# Patient Record
Sex: Female | Born: 1955 | Race: White | Hispanic: No | Marital: Married | Smoking: Current every day smoker
Health system: Southern US, Community
[De-identification: ages and names within clinical notes are randomized; demographics above are authoritative.]

## PROBLEM LIST (undated history)

## (undated) DIAGNOSIS — E781 Pure hyperglyceridemia: Secondary | ICD-10-CM

## (undated) DIAGNOSIS — J40 Bronchitis, not specified as acute or chronic: Secondary | ICD-10-CM

## (undated) DIAGNOSIS — R000 Tachycardia, unspecified: Secondary | ICD-10-CM

## (undated) DIAGNOSIS — F32A Depression, unspecified: Secondary | ICD-10-CM

## (undated) DIAGNOSIS — G510 Bell's palsy: Secondary | ICD-10-CM

## (undated) DIAGNOSIS — F419 Anxiety disorder, unspecified: Secondary | ICD-10-CM

## (undated) DIAGNOSIS — J3089 Other allergic rhinitis: Secondary | ICD-10-CM

## (undated) DIAGNOSIS — K219 Gastro-esophageal reflux disease without esophagitis: Secondary | ICD-10-CM

## (undated) DIAGNOSIS — R011 Cardiac murmur, unspecified: Secondary | ICD-10-CM

## (undated) DIAGNOSIS — N9489 Other specified conditions associated with female genital organs and menstrual cycle: Secondary | ICD-10-CM

## (undated) DIAGNOSIS — T4145XA Adverse effect of unspecified anesthetic, initial encounter: Secondary | ICD-10-CM

## (undated) DIAGNOSIS — K2270 Barrett's esophagus without dysplasia: Secondary | ICD-10-CM

## (undated) DIAGNOSIS — F329 Major depressive disorder, single episode, unspecified: Secondary | ICD-10-CM

## (undated) DIAGNOSIS — T8859XA Other complications of anesthesia, initial encounter: Secondary | ICD-10-CM

## (undated) DIAGNOSIS — J449 Chronic obstructive pulmonary disease, unspecified: Secondary | ICD-10-CM

## (undated) DIAGNOSIS — M5030 Other cervical disc degeneration, unspecified cervical region: Secondary | ICD-10-CM

## (undated) DIAGNOSIS — G43909 Migraine, unspecified, not intractable, without status migrainosus: Secondary | ICD-10-CM

## (undated) DIAGNOSIS — L570 Actinic keratosis: Secondary | ICD-10-CM

## (undated) HISTORY — DX: Cardiac murmur, unspecified: R01.1

## (undated) HISTORY — DX: Pure hyperglyceridemia: E78.1

## (undated) HISTORY — DX: Other cervical disc degeneration, unspecified cervical region: M50.30

## (undated) HISTORY — PX: APPENDECTOMY: SHX54

## (undated) HISTORY — PX: CHOLECYSTECTOMY: SHX55

## (undated) HISTORY — PX: DIAGNOSTIC LAPAROSCOPY: SUR761

## (undated) HISTORY — DX: Other specified conditions associated with female genital organs and menstrual cycle: N94.89

## (undated) HISTORY — DX: Actinic keratosis: L57.0

## (undated) HISTORY — PX: ABDOMINAL HYSTERECTOMY: SHX81

## (undated) HISTORY — PX: CARDIAC CATHETERIZATION: SHX172

---

## 1978-02-01 HISTORY — PX: GALLBLADDER SURGERY: SHX652

## 1981-02-01 HISTORY — PX: TUBAL LIGATION: SHX77

## 1997-06-10 ENCOUNTER — Other Ambulatory Visit: Admission: RE | Admit: 1997-06-10 | Discharge: 1997-06-10 | Payer: Self-pay | Admitting: *Deleted

## 1997-07-01 ENCOUNTER — Other Ambulatory Visit: Admission: RE | Admit: 1997-07-01 | Discharge: 1997-07-01 | Payer: Self-pay | Admitting: *Deleted

## 1998-09-26 ENCOUNTER — Emergency Department (HOSPITAL_COMMUNITY): Admission: EM | Admit: 1998-09-26 | Discharge: 1998-09-26 | Payer: Self-pay | Admitting: Emergency Medicine

## 2002-12-20 ENCOUNTER — Emergency Department (HOSPITAL_COMMUNITY): Admission: AD | Admit: 2002-12-20 | Discharge: 2002-12-20 | Payer: Self-pay | Admitting: Family Medicine

## 2003-03-14 ENCOUNTER — Emergency Department (HOSPITAL_COMMUNITY): Admission: EM | Admit: 2003-03-14 | Discharge: 2003-03-14 | Payer: Self-pay | Admitting: Family Medicine

## 2003-03-16 ENCOUNTER — Emergency Department (HOSPITAL_COMMUNITY): Admission: AD | Admit: 2003-03-16 | Discharge: 2003-03-16 | Payer: Self-pay | Admitting: Emergency Medicine

## 2003-05-05 ENCOUNTER — Emergency Department (HOSPITAL_COMMUNITY): Admission: AD | Admit: 2003-05-05 | Discharge: 2003-05-05 | Payer: Self-pay | Admitting: Family Medicine

## 2003-06-20 ENCOUNTER — Emergency Department (HOSPITAL_COMMUNITY): Admission: EM | Admit: 2003-06-20 | Discharge: 2003-06-20 | Payer: Self-pay | Admitting: Emergency Medicine

## 2004-01-14 ENCOUNTER — Ambulatory Visit: Payer: Self-pay | Admitting: Gastroenterology

## 2004-01-24 ENCOUNTER — Emergency Department (HOSPITAL_COMMUNITY): Admission: EM | Admit: 2004-01-24 | Discharge: 2004-01-25 | Payer: Self-pay | Admitting: Emergency Medicine

## 2004-02-14 ENCOUNTER — Ambulatory Visit: Payer: Self-pay | Admitting: Gastroenterology

## 2004-07-02 ENCOUNTER — Emergency Department (HOSPITAL_COMMUNITY): Admission: EM | Admit: 2004-07-02 | Discharge: 2004-07-02 | Payer: Self-pay | Admitting: Emergency Medicine

## 2004-10-09 ENCOUNTER — Ambulatory Visit: Payer: Self-pay

## 2004-12-10 ENCOUNTER — Emergency Department: Payer: Self-pay | Admitting: Emergency Medicine

## 2004-12-27 ENCOUNTER — Emergency Department (HOSPITAL_COMMUNITY): Admission: EM | Admit: 2004-12-27 | Discharge: 2004-12-27 | Payer: Self-pay | Admitting: Emergency Medicine

## 2005-01-08 ENCOUNTER — Emergency Department (HOSPITAL_COMMUNITY): Admission: EM | Admit: 2005-01-08 | Discharge: 2005-01-08 | Payer: Self-pay | Admitting: Family Medicine

## 2005-01-10 ENCOUNTER — Emergency Department (HOSPITAL_COMMUNITY): Admission: AD | Admit: 2005-01-10 | Discharge: 2005-01-10 | Payer: Self-pay | Admitting: Family Medicine

## 2005-02-23 ENCOUNTER — Emergency Department (HOSPITAL_COMMUNITY): Admission: EM | Admit: 2005-02-23 | Discharge: 2005-02-24 | Payer: Self-pay | Admitting: Emergency Medicine

## 2005-02-27 ENCOUNTER — Emergency Department (HOSPITAL_COMMUNITY): Admission: EM | Admit: 2005-02-27 | Discharge: 2005-02-27 | Payer: Self-pay | Admitting: Emergency Medicine

## 2005-03-28 ENCOUNTER — Emergency Department (HOSPITAL_COMMUNITY): Admission: EM | Admit: 2005-03-28 | Discharge: 2005-03-28 | Payer: Self-pay | Admitting: Family Medicine

## 2005-04-15 ENCOUNTER — Emergency Department (HOSPITAL_COMMUNITY): Admission: EM | Admit: 2005-04-15 | Discharge: 2005-04-15 | Payer: Self-pay | Admitting: Family Medicine

## 2005-05-14 ENCOUNTER — Emergency Department (HOSPITAL_COMMUNITY): Admission: EM | Admit: 2005-05-14 | Discharge: 2005-05-14 | Payer: Self-pay | Admitting: Family Medicine

## 2005-06-03 ENCOUNTER — Emergency Department (HOSPITAL_COMMUNITY): Admission: EM | Admit: 2005-06-03 | Discharge: 2005-06-04 | Payer: Self-pay | Admitting: Emergency Medicine

## 2005-06-06 ENCOUNTER — Emergency Department (HOSPITAL_COMMUNITY): Admission: EM | Admit: 2005-06-06 | Discharge: 2005-06-06 | Payer: Self-pay | Admitting: *Deleted

## 2005-06-06 ENCOUNTER — Inpatient Hospital Stay (HOSPITAL_COMMUNITY): Admission: EM | Admit: 2005-06-06 | Discharge: 2005-06-12 | Payer: Self-pay | Admitting: *Deleted

## 2005-06-07 ENCOUNTER — Ambulatory Visit: Payer: Self-pay | Admitting: *Deleted

## 2005-07-07 ENCOUNTER — Emergency Department (HOSPITAL_COMMUNITY): Admission: EM | Admit: 2005-07-07 | Discharge: 2005-07-07 | Payer: Self-pay | Admitting: Emergency Medicine

## 2005-10-08 ENCOUNTER — Ambulatory Visit: Payer: Self-pay | Admitting: Psychiatry

## 2005-10-08 ENCOUNTER — Inpatient Hospital Stay (HOSPITAL_COMMUNITY): Admission: RE | Admit: 2005-10-08 | Discharge: 2005-10-16 | Payer: Self-pay | Admitting: Psychiatry

## 2005-10-20 ENCOUNTER — Other Ambulatory Visit (HOSPITAL_COMMUNITY): Admission: RE | Admit: 2005-10-20 | Discharge: 2006-01-18 | Payer: Self-pay | Admitting: Psychiatry

## 2006-09-12 ENCOUNTER — Emergency Department: Payer: Self-pay | Admitting: Emergency Medicine

## 2007-10-04 ENCOUNTER — Emergency Department: Payer: Self-pay | Admitting: Emergency Medicine

## 2007-11-01 ENCOUNTER — Ambulatory Visit: Payer: Self-pay | Admitting: Gastroenterology

## 2007-11-06 ENCOUNTER — Ambulatory Visit: Payer: Self-pay | Admitting: Gastroenterology

## 2007-11-20 ENCOUNTER — Ambulatory Visit: Payer: Self-pay | Admitting: Gastroenterology

## 2008-05-17 DIAGNOSIS — K589 Irritable bowel syndrome without diarrhea: Secondary | ICD-10-CM | POA: Insufficient documentation

## 2008-05-17 DIAGNOSIS — K2270 Barrett's esophagus without dysplasia: Secondary | ICD-10-CM | POA: Insufficient documentation

## 2008-05-17 DIAGNOSIS — F319 Bipolar disorder, unspecified: Secondary | ICD-10-CM | POA: Insufficient documentation

## 2008-08-22 DIAGNOSIS — E782 Mixed hyperlipidemia: Secondary | ICD-10-CM | POA: Insufficient documentation

## 2008-10-29 DIAGNOSIS — R634 Abnormal weight loss: Secondary | ICD-10-CM | POA: Insufficient documentation

## 2008-10-29 DIAGNOSIS — R5381 Other malaise: Secondary | ICD-10-CM | POA: Insufficient documentation

## 2008-10-29 DIAGNOSIS — R5383 Other fatigue: Secondary | ICD-10-CM | POA: Insufficient documentation

## 2009-06-11 ENCOUNTER — Emergency Department: Payer: Self-pay

## 2009-06-16 DIAGNOSIS — E861 Hypovolemia: Secondary | ICD-10-CM

## 2009-06-16 DIAGNOSIS — E869 Volume depletion, unspecified: Secondary | ICD-10-CM | POA: Insufficient documentation

## 2009-06-18 DIAGNOSIS — E7800 Pure hypercholesterolemia, unspecified: Secondary | ICD-10-CM | POA: Insufficient documentation

## 2009-07-30 ENCOUNTER — Ambulatory Visit: Payer: Self-pay | Admitting: Family

## 2009-08-05 ENCOUNTER — Ambulatory Visit: Payer: Self-pay | Admitting: Gastroenterology

## 2009-08-06 ENCOUNTER — Ambulatory Visit: Payer: Self-pay | Admitting: Gastroenterology

## 2009-11-20 ENCOUNTER — Ambulatory Visit: Payer: Self-pay | Admitting: Family Medicine

## 2009-11-20 DIAGNOSIS — F419 Anxiety disorder, unspecified: Secondary | ICD-10-CM | POA: Insufficient documentation

## 2009-11-20 DIAGNOSIS — R600 Localized edema: Secondary | ICD-10-CM | POA: Insufficient documentation

## 2009-11-20 DIAGNOSIS — F329 Major depressive disorder, single episode, unspecified: Secondary | ICD-10-CM

## 2009-11-21 ENCOUNTER — Encounter: Payer: Self-pay | Admitting: Family Medicine

## 2009-11-21 LAB — CONVERTED CEMR LAB
AST: 17 units/L (ref 0–37)
Alkaline Phosphatase: 84 units/L (ref 39–117)
Basophils Absolute: 0 10*3/uL (ref 0.0–0.1)
Basophils Relative: 1 % (ref 0–1)
CO2: 24 meq/L (ref 19–32)
Chloride: 103 meq/L (ref 96–112)
Creatinine, Ser: 1.04 mg/dL (ref 0.40–1.20)
HCT: 41.3 % (ref 36.0–46.0)
Hemoglobin: 13.7 g/dL (ref 12.0–15.0)
Lymphs Abs: 2.7 10*3/uL (ref 0.7–4.0)
MCHC: 33.2 g/dL (ref 30.0–36.0)
RBC: 4.3 M/uL (ref 3.87–5.11)
Total Bilirubin: 0.4 mg/dL (ref 0.3–1.2)

## 2009-11-22 ENCOUNTER — Telehealth (INDEPENDENT_AMBULATORY_CARE_PROVIDER_SITE_OTHER): Payer: Self-pay | Admitting: Internal Medicine

## 2010-01-20 ENCOUNTER — Ambulatory Visit: Payer: Self-pay | Admitting: Unknown Physician Specialty

## 2010-03-03 NOTE — Letter (Signed)
Summary: Generic Letter  [facility]  [address]   [place], [place] [address]   Phone: [phone]  Fax: [phone]    11/21/2009  Jordan Ortega [address] [place], [place]  [address]  EQUIPMENT ORDERS  CRUTCHES  DIAGNOSES:   (ICD-825.20)  (ICD-274.01)  (ICD-729.5)  (ICD-715.98)       Sincerely,   Standley Dakins MD

## 2010-03-03 NOTE — Letter (Signed)
Summary: Generic Letter  [facility]  [address]   [place], [place] [address]   Phone: [phone]  Fax: [phone]    11/20/2009  Jordan Ortega [address] [place], [place]  [address]  Dear [name],  Please Xray Left foot Diagnoses Left foot pain and swelling  - Acute  Please Fax Results to [phone]        Sincerely,   Standley Dakins MD

## 2010-03-03 NOTE — Letter (Signed)
Summary: internal charges  internal charges   Imported By: Dorna Leitz 11/20/2009 15:56:41  _____________________________________________________________________  External Attachment:    Type:   Image     Comment:   External Document

## 2010-03-03 NOTE — Letter (Signed)
Summary: Out of Work  [facility]  [address]   [place], [place] [address]   Phone: [phone]  Fax: [phone]    November 20, 2009   Employee:  Jordan Ortega    To Whom It May Concern:   For Medical reasons, please excuse the above named employee from work for the following dates:  Start:   11/20/09  End:   11/23/09  If you need additional information, please feel free to contact our office.         Sincerely,    Standley Dakins MD

## 2010-03-03 NOTE — Progress Notes (Signed)
Summary: phone note  Phone Note Outgoing Call   Call placed by: Providence Crosby LPN,  November 22, 2009 2:48 PM Call placed to: Patient Summary of Call: have left message for patient to return call x 2 so I can let her know about her labs Initial call taken by: Providence Crosby LPN,  November 22, 2009 2:49 PM  Follow-up for Phone Call        Patient notified of lab work; labs normal. Patient now states "what do I do both feet hurt now." Told patient I would let the Doctor know. Follow-up by: Providence Crosby LPN,  November 23, 2009 10:59 AM    Additional Follow-up for Phone Call Additional follow up Details #2::    labs and xrays OK. On ibup 800 three times a day  Will switch to mobic 15 mg once daily if patient wishes. Add tylenol 1 g four times daily. elevate and ice. consult podiatry per Dr. Henriette Combs f/u advice. jam md  Additional Follow-up for Phone Call Additional follow up Details #3:: Details for Additional Follow-up Action Taken: Patient notified of Dr. Garen Lah instructions: states she does not want to take mobic at present;  Wants to try ibuprofen and tylenol at present; again stressed to patient if no better in two days to call podiatry as recommened by Dr. Laural Benes. Additional Follow-up by: Providence Crosby LPN,  November 23, 2009 12:16 PM

## 2010-03-03 NOTE — Assessment & Plan Note (Signed)
Summary: LEFT FOOT PAIN/EVM   Vital Signs:  Patient Profile:   55 Years Old Female CC:      Left Foot Pain. Height:     61.25 inches Weight:      118 pounds BMI:     22.19 O2 Sat:      99 % O2 treatment:    Room Air Temp:     99 degrees F oral Pulse rate:   88 / minute Pulse rhythm:   regular Resp:     20 per minute BP sitting:   127 / 86  (right arm)  Pt. in pain?   yes    Location:   foot    Intensity:   8    Type:       sharp  Vitals Entered By: Levonne Spiller EMT-P (November 20, 2009 12:07 PM)              Is Patient Diabetic? No Comments Pt. is a smoker. 1half pack per day.      Current Allergies: ! VALIUM (DIAZEPAM) ! MORPHINE ! * CODINEHistory of Present Illness History from: patient Reason for visit: see chief complaint Chief Complaint: Left Foot Pain. History of Present Illness: Pt says that she got up this morning and started walking and she noticed a severe sharp pain in the left foot.  Pt says that it hurt really bad 8/10 and she says that she knows that it was not associated with any traumatic event. She says that she has no history of gout.  she says that the foot has been swollen in red in the areas where she was feeling pain.  She says that she was having difficulty bearing weight on the left foot.  Also, she says that she has not ever experienced this before. She does have a bunion in the left foot that has been present for quite some time. No history of OA or RA or Lupus.   No SOB or CP.     REVIEW OF SYSTEMS Constitutional Symptoms      Denies fever, chills, night sweats, weight loss, weight gain, and fatigue.  Eyes       Denies change in vision, eye pain, eye discharge, glasses, contact lenses, and eye surgery. Ear/Nose/Throat/Mouth       Denies hearing loss/aids, change in hearing, ear pain, ear discharge, dizziness, frequent runny nose, frequent nose bleeds, sinus problems, sore throat, hoarseness, and tooth pain or bleeding.  Respiratory  Denies dry cough, productive cough, wheezing, shortness of breath, asthma, bronchitis, and emphysema/COPD.  Cardiovascular       Denies murmurs, chest pain, and tires easily with exhertion.    Gastrointestinal       Denies stomach pain, nausea/vomiting, diarrhea, constipation, blood in bowel movements, and indigestion. Genitourniary       Denies painful urination, kidney stones, and loss of urinary control. Neurological       Denies paralysis, seizures, and fainting/blackouts. Musculoskeletal       Complains of muscle pain and joint pain.      Denies joint stiffness, decreased range of motion, redness, swelling, muscle weakness, and gout.  Skin       Denies bruising, unusual mles/lumps or sores, and hair/skin or nail changes.      Comments: Minor Swelling Psych       Denies mood changes, temper/anger issues, anxiety/stress, speech problems, depression, and sleep problems.  Past History:  Past Medical History: Anxiety Depression  Past Surgical History: Cholecystectomy Hysterectomy  Family History:  Parents living and siblings living and well per patient  Social History: Pt denies Alcohol use-no Drug use-no Pt working full time.  Drug Use:  no Physical Exam General appearance: well developed, well nourished, no acute distress Head: normocephalic, atraumatic Eyes: conjunctivae and lids normal Pupils: equal, round, reactive to light Ears: normal, no lesions or deformities Nasal: mucosa pink, nonedematous, no septal deviation, turbinates normal Oral/Pharynx: tongue normal, posterior pharynx without erythema or exudate Neck: neck supple,  trachea midline, no masses Chest/Lungs: no rales, wheezes, or rhonchi bilateral, breath sounds equal without effort Heart: regular rate and  rhythm, no murmur Abdomen: soft, non-tender without obvious organomegaly Extremities: painful left foot between 1st and 2nd toes, FROM of toes, ankle, normal pedal pulses, swelling between toes 1 and 2.  mild metatarsal phalangeal tenderness with palpation Neurological: grossly intact and non-focal Skin: no obvious rashes or lesions MSE: oriented to time, place, and person Assessment New Problems: DEPRESSION (ICD-311) ANXIETY (ICD-300.00) ? of FRACTURE, FOOT (ICD-825.20) ? of ACUTE GOUTY ARTHROPATHY (ICD-274.01) FOOT PAIN (ICD-729.5) ? of OSTEOARTHROS UNSPEC GEN/LOC OTH SPEC SITES [phone])   Patient Education: Patient and/or caregiver instructed in the following: rest, Ibuprofen prn. The risks, benefits and possible side effects were clearly explained and discussed with the patient.  The patient verbalized clear understanding.  The patient was given instructions to return if symptoms don't improve, worsen or new changes develop.  If it is not during clinic hours and the patient cannot get back to this clinic then the patient was told to seek medical care at an available urgent care or emergency department.  The patient verbalized understanding.   Demonstrates willingness to comply.  Plan New Medications/Changes: IBUPROFEN 800 MG TABS (IBUPROFEN) take 1 by mouth every 8 hours with food as needed foot pain  #15 x 0, 11/20/2009, Clanford Johnson MD  New Orders: CBC w/Diff [[id number]] T-CMP with Estimated GFR [[address]] Sed Rate (ESR)-[facility] [85651] T-Uric Acid (Blood) [84550-23180] Antinuclear Antib (ANA) [[id number]] Follow Up: Follow up in 1-2 days if no improvement, Follow up on an as needed basis, Follow up with Primary Physician  The patient and/or caregiver has been counseled thoroughly with regard to medications prescribed including dosage, schedule, interactions, rationale for use, and possible side effects and they verbalize understanding.  Diagnoses and expected course of recovery discussed and will return if not improved as expected or if the condition worsens. Patient and/or caregiver verbalized understanding.  Prescriptions: IBUPROFEN 800 MG TABS (IBUPROFEN) take 1 by  mouth every 8 hours with food as needed foot pain  #15 x 0   Entered and Authorized by:   Standley Dakins MD   Signed by:   Standley Dakins MD on 11/20/2009   Method used:   Electronically to        [facility]* (retail)       [address]       [place], [place]  [address]       Ph: [phone]       Fax: [phone]   RxID:   [id number]   Patient Instructions: 1)  Go to get your XRays of the left foot done ASAP 2)  Try to stay off the left foot for the next 1-2 days 3)  Take 400-600mg  of Ibuprofen (Advil, Motrin) with food every 8 hours as needed for relief of pain or comfort of fever. 4)  See a podiatrist to have your left foot evaluated ASAP 5)  The patient was informed that there is no on-call provider or services  available at this clinic during off-hours (when the clinic is closed).  If the patient developed a problem or concern that required immediate attention, the patient was advised to go the the nearest available urgent care or emergency department for medical care.  The patient verbalized understanding.    6)  It was clearly explained to the patient that this [facility] is not intended to be a primary care clinic.  The patient is always better served by the continuity of care and the provider/patient relationships developed with their dedicated primary care provider.  The patient was told to be sure to follow up as soon as possible with their primary care provider to discuss treatments received and to receive further examination and testing.  The patient verbalized understanding. The will f/u with PCP ASAP.   Medication Administration  Injection # 1:    Medication: Toradol 60mg     Diagnosis: FOOT PAIN (ICD-729.5)    Route: IM    Site: LUOQ gluteus    Exp Date: 01/02/2011    Lot #: [id number]    Mfr: Hospira    Patient tolerated injection without complications    Given by: Levonne Spiller EMT-P (November 20, 2009 1:15 PM)  Orders Added: 1)  CBC w/Diff  [[id number]] 2)  T-CMP with Estimated GFR [[address]] 3)  Sed Rate (ESR)-[facility] [85651] 4)  T-Uric Acid (Blood) [84550-23180] 5)  Antinuclear Antib (ANA) [[id number]]
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
## 2010-06-19 NOTE — Discharge Summary (Signed)
NAME:  Jordan Ortega, Jordan Ortega NO.:  [id number]   MEDICAL RECORD NO.:  [id number]          PATIENT TYPE:  IPS   LOCATION:  0302                          FACILITY:  [facility]   PHYSICIAN:  Jasmine Pang, M.D. DATE OF BIRTH:  Jun 08, 1955   DATE OF ADMISSION:  06/06/2005  DATE OF DISCHARGE:  06/12/2005                                 DISCHARGE SUMMARY   IDENTIFYING INFORMATION:  The patient is a 55 year old married Caucasian  female who was admitted on a voluntary basis on Jun 06, 2005.   HISTORY OF PRESENT ILLNESS:  The patient had a history of increased Valium  use.  She took 8 10 mg Valium on the Friday prior to admission hoping to  make herself feel better and to make painful feelings stop.  She was having  problems at work with her Production designer, theatre/television/film.  She states he is very belittling to her  and other coworkers in front of people.  She feels embarrassed by this.  She  states she also drinks when she takes Valium.  She used to drink more,  especially up to a 12-pack.  She sleeps okay with medication.  She has had a  recent increase in weight.  She was weighing 70 pounds and now weighs 90  pounds.  Apparently, this increase in weight was intentional.   On admission, the patient's mental status exam revealed an alert,  cooperative, thin Caucasian female with good eye contact.  She was well-  groomed.  She was casually dressed.  Her speech was slow and deliberate.  Her psychomotor activity was decreased.  Mood:  She states she felt  depressed and overwhelmed.  Affect was flat.  She denied suicidal or  homicidal ideation and states she has lost track of the amount of Valium she  took.  She denies she was trying to harm herself.  She just wanted to feel  better.  There was no self-injurious behavior.  There was no auditory or  visual hallucinations.  No paranoia or delusions.  Thought process were  coherent and linear.  Thought content worried about her migraine headache  pain.  Cognitive  exam was grossly within normal limits.   PAST PSYCHIATRIC HISTORY:  This is the first Behavioral Health admission for  Jordan Ortega.  She is in treatment with Dr. Milagros Evener.  She is unclear  how long this has been.  She has had a history of overdose x2 during the  last year by her report.   ALCOHOL/DRUG HISTORY:  She denies any substance abuse history.  Smokes  cigarettes, denies any drug use.  Does state she drinks alcohol.   PAST MEDICAL HISTORY:  The patient is seen at the [facility]  for her outpatient medical care.  Medical problems are migraines.   MEDICATIONS:  Phenergan 25 mg q.d., Premarin 1.25 mg q.d., Seroquel 50 mg  q.h.s., amitriptyline 150 mg q.d.   DRUG ALLERGIES:  She is allergic to CLARITIN, CODEINE, TOPROL and NUBAIN.   PHYSICAL EXAMINATION:  The patient's physical exam was done by Landry Corporal, our nurse practitioner.  She was found to have no acute medical  problems.  Temperature was 97.8, pulse 81, respirations 18, BP 104/71.  She  was 100 pounds and 5 feet 1 inch,   LABORATORY DATA:  On admission, initial TSH was high at 7.72.  A repeat was  ordered with a T4 and T3.  The TSH, at that point, as well as the T4 and T3  were within normal limits.  Routine chemistry panel was grossly within  normal limits.  Alcohol level was less than 5.  CBC was within normal  limits.  UDS positive for benzodiazepines.   HOSPITAL COURSE:  Upon admission, the patient was continued on Phenergan 25  mg p.o. IM q.6h. p.r.n. nausea, Premarin 1.25 mg p.o. q.h.s., Seroquel 50 mg  p.o. q.h.s., amitriptyline 150 mg p.o. q.h.s., Percocet 5/500 mg, 2 tablets  p.o. x1 now, trazodone 50 mg p.o. q.h.s. p.r.n. may repeat x1.  The patient  was also started on a low-dose Librium protocol.  On Jun 07, 2005, Percocet  was discontinued and Vicodin 5/500 mg, 2 tablets p.o. x1 was given.  Also on  Jun 07, 2005, Dilaudid 1 mg IM x1 now was given due to severe migraine  headaches.   On Jun 08, 2005, Fioricet 2 mg p.o. now, then 1 q.6h. p.r.n.  migraine headaches was ordered.  On Jun 09, 2005, Seroquel was increased to  100 mg p.o. q.h.s.  On Jun 10, 2005, Cymbalta was started at 30 mg p.o.  q.h.s. x1 day, then 60 mg p.o. q.h.s.  On Jun 11, 2005, Seroquel 50 mg p.o.  t.i.d. p.r.n. anxiety was started.  On Jun 11, 2005, her Fioricet dose was  increased to 2 pills p.o. q.6h. p.r.n. headaches.  The patient tolerated her  meds well with no significant side effects.  She stated she was not getting  relief from her pain medications.   The patient was quite depressed and tearful initially.  She discussed with  me at length her stress at work.  She states her direct supervisor is very  critical and likes to embarrass me in front of others.  She states he  would deliberately criticize all the employees in public where others can  hear.  She reported having a supportive husband and sons.  She has been  married for 25 years..  She again denied that she had deliberately taken an  overdose of Valium.  She continued to take more due to her anxiety and then  passed out.  At that point, she was taken to the ED.  On Jun 09, 2005, the  patient was very tearful and depressed.  She was upset extensively because  she was not allowed to keep her prayer shawl with her (it has be locked up  for safety reasons).  I explained the reason but patient remained very  focused on the situation.  On Jun 10, 2005, the patient was depressed and  anxious with a sad, worried affect.  She complained of migraine headaches.  She wanted an injection of Demerol.  It was at that point, I decided to add  Cymbalta.  We also began some Fioricet for her pain.  On Jun 11, 2005, there  was some improvement in her mood.  She was less depressed and anxious.  She  wanted to go home the following day.  There was a family session scheduled.  Fioricet was being given for her migraine headaches.  On Jun 12, 2005, a family  session was  held with our Child psychotherapist and  patient's husband.  Events leading up to admission were discussed.  The  patient was able to identify triggers of being left alone in the house and  increased stress at work.  She spoke about using painting, working in the  garden and attending AA as ways to deal with stress and loneliness.  Her  husband agreed to attend AA support meetings with her and she requested  getting info on meetings in [place].  She spoke about a plan to stay busy  and active upon returning home.  She identified the desire to learn how to  say no to others and learn how to take care of herself.  It was recommended  by our social worker that she have individual therapy to help her continue  to work on decreasing her stressors and overall support.   On Jun 12, 2005, the patient felt ready for discharge.  Her mental status  had improved from admission status.  Her family session had gone well.  She  was friendly and cooperative with good eye contact.  Psychomotor was within  normal limits.  Speech normal rate and flow.  Mood less depressed and less  anxious.  Affect wider range.  There was no suicidal or homicidal ideation.  No self-injurious behavior.  No auditory or visual hallucinations.  No  paranoia or delusions.  Thoughts logical and goal-directed.  Thought content  still focused on her migraine headaches and wanting medicine for this.  Cognitive exam was grossly within normal limits.  The patient will go home  with her husband and follow-up will be with Dr. Milagros Evener.   DISCHARGE DIAGNOSES:  AXIS I:  Major depression, recurrent, severe without  psychosis.  Polysubstance abuse.  AXIS II:  None.  AXIS III:  Migraines.  AXIS IV:  Severe (occupational problems).  AXIS V:  GAF upon admission 35; highest past year 65; GAF upon discharge 45.   ACTIVITY/DIET:  There were no specific activity level or dietary  restrictions.   DISCHARGE MEDICATIONS:  1.   Premarin 1.25 mg tablet at bedtime.  2.  Elavil 50 mg, 3 pills at night.  3.  Seroquel 100 mg p.o. q.h.s.  4.  Cymbalta 60 mg p.o. q.h.s.   POST-HOSPITAL CARE PLANS:  The patient will be followed by Dr. Milagros Evener  at [facility].  She will be seen on Tuesday, July 06, 2005  at 1:30 p.m.      Jasmine Pang, M.D.  Electronically Signed     [facility]/MEDQ  D:  06/12/2005  T:  06/14/2005  Job:  [id number]

## 2010-06-19 NOTE — Discharge Summary (Signed)
NAMEMEKENZIE, MODESTE NO.:  [id number]   MEDICAL RECORD NO.:  [id number]          PATIENT TYPE:  IPS   LOCATION:  0503                          FACILITY:  [facility]   PHYSICIAN:  Anselm Jungling, MD  DATE OF BIRTH:  1955-11-20   DATE OF ADMISSION:  10/08/2005  DATE OF DISCHARGE:  10/16/2005                                 DISCHARGE SUMMARY   IDENTIFYING DATA AND REASON FOR ADMISSION:  The patient is a 55 year old  married white female who reported to our intensive outpatient program on  September 7, and there disclosed a plan to cut herself, in association with  suicidal ideation.  The doctor who evaluated her there recommended that she  be admitted.  The patient reported a history of depression for the past 10-  15 years, and had also been treated for migraine headaches for at least that  long.  She had recently been restarted on Valium in August 2007, reportedly  taking 10 mg three times daily.  A family friend had recently died in [place]  in the conflict there.  This had been 3 weeks prior to admission and it felt  like one of her children had died.  She had previously been hospitalized in  our inpatient service in early May 2007.  Please refer to the admission note  for further details pertaining to the symptoms, circumstances and history  that led to her hospitalization.  She was given initial Axis I diagnosis of  major depressive disorder recurrent, benzodiazepine withdrawal, and an Axis  II diagnosis of rule out personality disorder.   MEDICAL AND LABORATORY:  The patient was medically and physically assessed  by the psychiatric nurse practitioner.  She came to [place] on ASA 81 mg daily  and Premarin 0.625 mg daily.  She had a long history of migraine headaches,  and apparently had had numerous emergency room visits for various injection  based opiate  treatments for same.   The patient complained of migraine headache during her inpatient stay and  was treated with  IM Dilaudid initially, but it appeared that this was not  really appropriate for the patient in light of her history of rebound  headaches in response to Dilaudid.  Following this, she was changed to a  regimen of p.r.n., Fioricet.  Phenergan was also used in conjunction with  these analgesics.   There are no acute medical issues during this brief inpatient psychiatric  stay.  Naproxen 500 mg q.12 h was also utilized for pain relief.   HOSPITAL COURSE:  The patient was admitted to the adult inpatient  psychiatric service.  She presented as a petite, but well-nourished, and  normally developed adult female.  Initially she was unable to participate  very much in the treatment program, complaining of severe migraine headache.  When she received Dilaudid injection, she was then unable to participate  much.  She insisted that the only thing that would relieve her headaches  with be to be knocked out for 4+ hours at a time.  It was communicated to  the patient after the initial  Dilaudid injection that this would not be an  acceptable strategy for her inpatient course, that she needed to be able to  participate in all therapeutic groups and activities.   Unbeknown to [place], the patient had been taking benzodiazepines in much larger  quantities then she had initially admitted to upon admission.  Consequently,  the patient appeared to go into benzodiazepine withdrawal and experienced 2-  3 days of moderate confusion and mild delirium, often going into patient's  rooms, and having difficulty keeping track of time and structure.   After this past, the patient was able to  comment on the fact that she had  been quite confused for several days and had little memory of that time.  The patient was confronted about the assessment of chemical dependency and  need to refrain from opiate analgesics use, and benzodiazepine use.  The  patient indicated that she might consider the possibility of some form of   chemical dependency treatment following her inpatient stabilization, but did  not appear to take the expressions of concern and associated warnings  seriously about chemical dependency.   The patient requested discharge on the ninth hospital day.  At this point,  her mentation and sensorium was entirely clear.  She was relaxed, pleasant,  appropriate, and absent suicidal ideation.  She appeared appropriate for  discharge.  Chemical dependency treatment was recommended, and the patient  left open the door the possibility of attending our chemical dependency  intensive outpatient treatment the following Monday.   AFTERCARE:  The patient was to follow-up as referenced above.  Other follow-  up appointments were to be arranged at the time of discharge, which occurred  on a Saturday.  The psychiatric case manager was to make definitive  appointments  for her following the weekend, which would be communicated to  the patient.   DISCHARGE MEDICATIONS:  Lexapro 10 mg daily, aspirin 81 mg daily, lithium  carbonate 300 mg b.i.d., naproxen 500 mg b.i.d., Seroquel 25 mg t.i.d. and  100 mg q.h.s..   DISCHARGE DIAGNOSES:  AXIS I: Mood disorder not otherwise specified,  benzodiazepine dependence, early remission.  AXIS II: Cluster B personality traits.  AXIS III: History of chronic migraine headache.  AXIS IV: Stressors severe.  AXIS V: Global assessment of functioning on discharge 65.      Anselm Jungling, MD  Electronically Signed     SPB/MEDQ  D:  10/16/2005  T:  10/17/2005  Job:  [id number]

## 2010-09-28 ENCOUNTER — Telehealth: Payer: Self-pay

## 2011-01-04 ENCOUNTER — Ambulatory Visit: Payer: Self-pay | Admitting: Family Medicine

## 2011-08-19 ENCOUNTER — Emergency Department: Payer: Self-pay | Admitting: Emergency Medicine

## 2011-08-19 LAB — CBC
MCV: 93 fL (ref 80–100)
Platelet: 260 10*3/uL (ref 150–440)
RBC: 3.94 10*6/uL (ref 3.80–5.20)
RDW: 12.6 % (ref 11.5–14.5)
WBC: 10.4 10*3/uL (ref 3.6–11.0)

## 2011-08-19 LAB — COMPREHENSIVE METABOLIC PANEL
Albumin: 3.4 g/dL (ref 3.4–5.0)
Alkaline Phosphatase: 116 U/L (ref 50–136)
Anion Gap: 7 (ref 7–16)
BUN: 13 mg/dL (ref 7–18)
Bilirubin,Total: 0.2 mg/dL (ref 0.2–1.0)
Creatinine: 1.24 mg/dL (ref 0.60–1.30)
Osmolality: 282 (ref 275–301)
Potassium: 3.5 mmol/L (ref 3.5–5.1)
Sodium: 141 mmol/L (ref 136–145)
Total Protein: 6.9 g/dL (ref 6.4–8.2)

## 2011-08-19 LAB — TROPONIN I: Troponin-I: <0.02 ng/mL

## 2011-11-09 NOTE — Telephone Encounter (Signed)
tkk 

## 2011-11-28 ENCOUNTER — Inpatient Hospital Stay (HOSPITAL_COMMUNITY)
Admission: EM | Admit: 2011-11-28 | Discharge: 2011-11-30 | DRG: 247 | Disposition: A | Discharge status: Home / Self Care | Payer: BC Managed Care – PPO | Attending: Internal Medicine | Admitting: Internal Medicine

## 2011-11-28 ENCOUNTER — Encounter (HOSPITAL_COMMUNITY): Payer: Self-pay | Admitting: *Deleted

## 2011-11-28 ENCOUNTER — Emergency Department (HOSPITAL_COMMUNITY): Payer: BC Managed Care – PPO

## 2011-11-28 DIAGNOSIS — F17200 Nicotine dependence, unspecified, uncomplicated: Secondary | ICD-10-CM | POA: Diagnosis present

## 2011-11-28 DIAGNOSIS — F329 Major depressive disorder, single episode, unspecified: Secondary | ICD-10-CM

## 2011-11-28 DIAGNOSIS — F32A Depression, unspecified: Secondary | ICD-10-CM | POA: Diagnosis present

## 2011-11-28 DIAGNOSIS — G43909 Migraine, unspecified, not intractable, without status migrainosus: Secondary | ICD-10-CM | POA: Diagnosis present

## 2011-11-28 DIAGNOSIS — Z79899 Other long term (current) drug therapy: Secondary | ICD-10-CM

## 2011-11-28 DIAGNOSIS — R29810 Facial weakness: Secondary | ICD-10-CM | POA: Diagnosis present

## 2011-11-28 DIAGNOSIS — R531 Weakness: Secondary | ICD-10-CM | POA: Diagnosis present

## 2011-11-28 DIAGNOSIS — F419 Anxiety disorder, unspecified: Secondary | ICD-10-CM | POA: Diagnosis present

## 2011-11-28 DIAGNOSIS — F3289 Other specified depressive episodes: Secondary | ICD-10-CM | POA: Diagnosis present

## 2011-11-28 DIAGNOSIS — F411 Generalized anxiety disorder: Secondary | ICD-10-CM | POA: Diagnosis present

## 2011-11-28 DIAGNOSIS — R29898 Other symptoms and signs involving the musculoskeletal system: Principal | ICD-10-CM | POA: Diagnosis present

## 2011-11-28 DIAGNOSIS — M6281 Muscle weakness (generalized): Secondary | ICD-10-CM

## 2011-11-28 HISTORY — DX: Bell's palsy: G51.0

## 2011-11-28 HISTORY — DX: Migraine, unspecified, not intractable, without status migrainosus: G43.909

## 2011-11-28 LAB — COMPREHENSIVE METABOLIC PANEL
AST: 18 U/L (ref 0–37)
Albumin: 4.2 g/dL (ref 3.5–5.2)
Alkaline Phosphatase: 69 U/L (ref 39–117)
BUN: 15 mg/dL (ref 6–23)
CO2: 26 mEq/L (ref 19–32)
Chloride: 103 mEq/L (ref 96–112)
Potassium: 3.3 mEq/L — ABNORMAL LOW (ref 3.5–5.1)
Total Bilirubin: 0.2 mg/dL — ABNORMAL LOW (ref 0.3–1.2)

## 2011-11-28 LAB — CBC
HCT: 36.6 % (ref 36.0–46.0)
MCV: 90.4 fL (ref 78.0–100.0)
RDW: 12.9 % (ref 11.5–15.5)
WBC: 6.2 10*3/uL (ref 4.0–10.5)

## 2011-11-28 LAB — DIFFERENTIAL
Eosinophils Absolute: 0.1 10*3/uL (ref 0.0–0.7)
Neutro Abs: 3 10*3/uL (ref 1.7–7.7)
Neutrophils Relative %: 49 % (ref 43–77)

## 2011-11-28 LAB — PROTIME-INR
INR: 1.03 (ref 0.00–1.49)
Prothrombin Time: 13.4 seconds (ref 11.6–15.2)

## 2011-11-28 LAB — GLUCOSE, CAPILLARY

## 2011-11-28 MED ORDER — DEXAMETHASONE SODIUM PHOSPHATE 10 MG/ML IJ SOLN
10.0000 mg | Freq: Once | INTRAMUSCULAR | Status: AC
  Administered 2011-11-28: 10 mg via INTRAVENOUS
  Filled 2011-11-28: qty 1

## 2011-11-28 NOTE — Code Documentation (Signed)
56 yo wf brought in via [facility] for acute onset facial numbness & Lt side weakness.  Pt reports having a migraine HA x3wks which she has been treating with vicodin & BC powder. 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  She sat down on the floor & her coworkers called EMS.  On arrival to [facility] pt had a varying neuro exam and was scored NIH 6 for facial droop, LUE & LLE weakness, sensory loss on Lt.  Code stroke called 2205, Pt arrival , EDP exam 2221, stroke team arrival 2210, LSN 2115, pt arrival in [place] 2223, phlebotomist arrival 2233. Code stroke was not cancelled.

## 2011-11-28 NOTE — Consult Note (Addendum)
Reason for Consult:stroke Referring Physician: Dr Jordan Ortega (ER)  Jordan Ortega is an 56 y.o. female.   HPI: 56 yo wf w h/o migraines x 25 yrs who is brought in via [facility] for acute onset left facial numbness & Lt side weakness.  Pt reports having a migraine HA x3wks (usual frequency about "once every few months") which she has been treating with vicodin & BC powder. 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 She sat down on the floor & her coworkers called EMS. On arrival, pt had a variable neuro exam and was scored NIH 6 for facial droop, LUE & LLE weakness, sensory loss on Lt.   She notes currently a "8/10" headache, with photo/phonophobia.  Denies any diplopia, dysphagia,dysarthria, or right sided complaints.   Past Medical History  Diagnosis Date  . Migraines     Past Surgical History  Procedure Date  . Abdominal hysterectomy   . Cholecystectomy     History reviewed. No pertinent family history.  Social History:  reports that she has been smoking Cigarettes.  She has been smoking about .5 packs per day. She does not have any smokeless tobacco history on file. She reports that she drinks alcohol. She reports that she does not use illicit drugs.  Allergies:  Allergies  Allergen Reactions  . Codeine Hives and Itching    REACTION: Dizziness  . Diazepam Other (See Comments)    REACTION: Behavioral problems  . Morphine Hives  . Niacin And Related Other (See Comments)    flushing    Medications: No current facility-administered medications for this encounter. No current outpatient prescriptions on file.   Results for orders placed during the hospital encounter of 11/28/11 (from the past 48 hour(s))  GLUCOSE, CAPILLARY     Status: Normal   Collection Time   11/28/11 10:43 PM      Component Value Range Comment   Glucose-Capillary 92  70 - 99 mg/dL    Comment 1 Notify RN      Comment 2 Documented in Chart     CBC     Status: Normal   Collection Time   11/28/11 10:54 PM      Component Value Range Comment   WBC 6.2  4.0 - 10.5 K/uL    RBC 4.05  3.87 - 5.11 MIL/uL    Hemoglobin 12.4  12.0 - 15.0 g/dL    HCT 47.8  29.5 - 62.1 %    MCV 90.4  78.0 - 100.0 fL    MCH 30.6  26.0 - 34.0 pg    MCHC 33.9  30.0 - 36.0 g/dL    RDW 30.8  65.7 - 84.6 %    Platelets 231  150 - 400 K/uL   DIFFERENTIAL     Status: Normal   Collection Time   11/28/11 10:54 PM      Component Value Range Comment   Neutrophils Relative 49  43 - 77 %    Neutro Abs 3.0  1.7 - 7.7 K/uL    Lymphocytes Relative 42  12 - 46 %    Lymphs Abs 2.6  0.7 - 4.0 K/uL    Monocytes Relative 7  3 - 12 %    Monocytes Absolute 0.5  0.1 - 1.0 K/uL    Eosinophils Relative 2  0 - 5 %    Eosinophils Absolute 0.1  0.0 -  0.7 K/uL    Basophils Relative 1  0 - 1 %    Basophils Absolute 0.0  0.0 - 0.1 K/uL     Ct Head Wo Contrast  11/28/2011  *RADIOLOGY REPORT*  Clinical Data: Code stroke.  Right-sided facial droop and weakness.  CT HEAD WITHOUT CONTRAST  Technique:  Contiguous axial images were obtained from the base of the skull through the vertex without contrast.  Comparison: CT head without contrast 07/07/2005.  Findings: No acute intracranial abnormality is present. Specifically, there is no evidence for acute infarct, hemorrhage, mass, hydrocephalus, or extra-axial fluid collection.  The paranasal sinuses and mastoid air cells are clear.  The globes and orbits are intact.  The osseous skull is intact.  IMPRESSION: Negative CT of the head.  These results were called by telephone on 11/28/2011 at 10:35 p.m. to Jordan Pugh, RN; Rapid Response Team, who verbally acknowledged these results.   Original Report Authenticated By: Jordan Ortega. Jordan Ortega, M.D.     Review of Systems: A comprehensive review of systems was negative except for: Neurological: positive for headaches  Blood pressure  120/75, pulse 76, resp. rate 16, SpO2 100.00%.  Neurological Exam:  Patient is alert and oriented x 3.  No acute distress.  Mildly dysarthric speech.  Able to name, repeat, and follow commands.  EOMI, VFFC, PERRL.  Face with ? Right lower facial weakness.  She reports increased pinprick sensitivity on the left, and that vibratory sensation "hurts". Tongue protrudes midline, otherwise CN's II-XII intact. Motor: Normal bulk and tone with 5/5 strength on right.  Appears to have ? 4/5 on the left, but give-way weakness noted.  Sensation:  Hypersensitive to pinprick/vibration on the left.  Cerebellar:  Normal finger to nose  Variable drift on left.  DTR's 2+ throughout with toes downgoing  CV: RRR  Lungs: CTAB  Abd: soft, NT/ND  Skin:  No cuts/ abrasions  Assessment/Plan:  1) Diziness/ left side weakness (variable) 2) probable migraine (likely associated with above)  Discussion:  56 YO WF with h/o migraines who presents with persistent migraine for past 3 weeks, and acute onset of diziness and left sided parasthesias/weakness tonight.  Her exam is quite variable and Head CT negative.   Overall, her headache seems to be a significant part of her presentation and may be associated with her more acute symptoms.  The variable nature of her exam suggests some functional overlay.  She does appear to have minimal vascular risk factors, but a stroke certainly cannot be excluded.  Recc:  1) vascular workup (Head MRI/ MRA, carotid, Echo) 2) PT, OT, ST 3) Telemetry 4) Aspirin/ Statin, and permissive BP control 5) try some Decadron for her acute headache 6) will need to clarify her migraine preventive regimen...needs to avoid OTC meds/ BC powders...likely has med overuse component   Beryle Beams Neurology Pager # [phone] 11/28/2011, 11:08 PM    ** 11/29/11 (0015 hrs);  Called by Dr Jordan Ortega (ER) out of concern she was "worse".  He notes she is exhibiting significant left side weakness and "failed"  her swallow eval.   I was able to re-examine pt and she continues to complain of significant left sided "pain" with manipulation of the left side, and still has some give-way weakness on the left.   She feels the left side is "better".  I see no real change on my exam.  Plan:  After discussion with Dr Jordan Ortega, will get diffusion weighted MRI....  If positive for ischemic lesion, then will need  further intervention.  Have also d/w husband and son at bedside.  Dr Olin Pia

## 2011-11-28 NOTE — ED Notes (Signed)
Pt presents w/ migraine HA x3 weeks - today approx 2115 pt began having rt facial drooping and left-sided weakness including LUE and LLE. Pt A&Ox4 on arrival. Pt also c/o heaviness to left arm.

## 2011-11-29 ENCOUNTER — Emergency Department (HOSPITAL_COMMUNITY): Payer: BC Managed Care – PPO

## 2011-11-29 ENCOUNTER — Encounter (HOSPITAL_COMMUNITY): Payer: Self-pay | Admitting: *Deleted

## 2011-11-29 DIAGNOSIS — R6889 Other general symptoms and signs: Secondary | ICD-10-CM

## 2011-11-29 DIAGNOSIS — F329 Major depressive disorder, single episode, unspecified: Secondary | ICD-10-CM

## 2011-11-29 DIAGNOSIS — F411 Generalized anxiety disorder: Secondary | ICD-10-CM

## 2011-11-29 LAB — GLUCOSE, CAPILLARY

## 2011-11-29 MED ORDER — KETOROLAC TROMETHAMINE 15 MG/ML IJ SOLN
15.0000 mg | Freq: Once | INTRAMUSCULAR | Status: AC
  Administered 2011-11-29: 15 mg via INTRAVENOUS
  Filled 2011-11-29: qty 1

## 2011-11-29 MED ORDER — POTASSIUM CHLORIDE IN NACL 20-0.9 MEQ/L-% IV SOLN
INTRAVENOUS | Status: DC
  Filled 2011-11-29: qty 1000

## 2011-11-29 MED ORDER — VALPROATE SODIUM 500 MG/5ML IV SOLN
500.0000 mg | Freq: Once | INTRAVENOUS | Status: AC
  Administered 2011-11-29: 500 mg via INTRAVENOUS
  Filled 2011-11-29: qty 5

## 2011-11-29 MED ORDER — SODIUM CHLORIDE 0.9 % IJ SOLN: 3.0000 mL | Freq: Two times a day (BID) | INTRAMUSCULAR | Status: DC

## 2011-11-29 MED ORDER — PROPRANOLOL HCL 20 MG PO TABS
20.0000 mg | ORAL_TABLET | Freq: Every day | ORAL | Status: DC
  Administered 2011-11-29 – 2011-11-30 (×2): 20 mg via ORAL
  Filled 2011-11-29 (×2): qty 1

## 2011-11-29 MED ORDER — KETOROLAC TROMETHAMINE 30 MG/ML IJ SOLN
30.0000 mg | Freq: Once | INTRAMUSCULAR | Status: AC
  Administered 2011-11-29: 30 mg via INTRAVENOUS
  Filled 2011-11-29: qty 1

## 2011-11-29 MED ORDER — SODIUM CHLORIDE 0.9 % IV SOLN
INTRAVENOUS | Status: DC
  Administered 2011-11-29: 04:00:00 via INTRAVENOUS

## 2011-11-29 MED ORDER — AMITRIPTYLINE HCL 100 MG PO TABS
100.0000 mg | ORAL_TABLET | Freq: Every day | ORAL | Status: DC
  Administered 2011-11-29: 100 mg via ORAL
  Filled 2011-11-29 (×2): qty 1

## 2011-11-29 MED ORDER — DEXAMETHASONE SODIUM PHOSPHATE 10 MG/ML IJ SOLN: 10.0000 mg | Freq: Once | INTRAMUSCULAR | Status: DC

## 2011-11-29 MED ORDER — VALPROATE SODIUM 500 MG/5ML IV SOLN
500.0000 mg | Freq: Once | INTRAVENOUS | Status: DC
  Filled 2011-11-29: qty 5

## 2011-11-29 MED ORDER — INFLUENZA VIRUS VACC SPLIT PF IM SUSP
0.5000 mL | INTRAMUSCULAR | Status: AC
  Administered 2011-11-30: 0.5 mL via INTRAMUSCULAR
  Filled 2011-11-29: qty 0.5

## 2011-11-29 MED ORDER — KETOROLAC TROMETHAMINE 30 MG/ML IJ SOLN
INTRAMUSCULAR | Status: AC
  Filled 2011-11-29: qty 1

## 2011-11-29 MED ORDER — KETOROLAC TROMETHAMINE 30 MG/ML IJ SOLN: 30.0000 mg | Freq: Once | INTRAMUSCULAR | Status: DC

## 2011-11-29 MED ORDER — METOCLOPRAMIDE HCL 5 MG/ML IJ SOLN
10.0000 mg | Freq: Once | INTRAMUSCULAR | Status: AC
  Administered 2011-11-29: 10 mg via INTRAVENOUS
  Filled 2011-11-29: qty 2

## 2011-11-29 MED ORDER — METOCLOPRAMIDE HCL 5 MG/ML IJ SOLN
10.0000 mg | Freq: Once | INTRAMUSCULAR | Status: AC
  Administered 2011-11-29: 10 mg via INTRAVENOUS
  Filled 2011-11-29 (×2): qty 2

## 2011-11-29 MED ORDER — NALBUPHINE HCL 10 MG/ML IJ SOLN
10.0000 mg | INTRAMUSCULAR | Status: DC | PRN
  Filled 2011-11-29 (×3): qty 1

## 2011-11-29 MED ORDER — PROMETHAZINE HCL 25 MG PO TABS
12.5000 mg | ORAL_TABLET | Freq: Four times a day (QID) | ORAL | Status: DC | PRN
  Administered 2011-11-29: 12.5 mg via ORAL
  Filled 2011-11-29: qty 1

## 2011-11-29 MED ORDER — ASPIRIN EC 81 MG PO TBEC
81.0000 mg | DELAYED_RELEASE_TABLET | Freq: Every day | ORAL | Status: DC
  Administered 2011-11-29 – 2011-11-30 (×2): 81 mg via ORAL
  Filled 2011-11-29 (×2): qty 1

## 2011-11-29 MED ORDER — DIPHENHYDRAMINE HCL 50 MG/ML IJ SOLN
25.0000 mg | Freq: Once | INTRAMUSCULAR | Status: AC
  Administered 2011-11-29: 25 mg via INTRAVENOUS
  Filled 2011-11-29: qty 1

## 2011-11-29 MED ORDER — SODIUM CHLORIDE 0.9 % IV SOLN
INTRAVENOUS | Status: DC
  Administered 2011-11-29: 19:00:00 via INTRAVENOUS

## 2011-11-29 MED ORDER — KETOROLAC TROMETHAMINE 30 MG/ML IJ SOLN
30.0000 mg | Freq: Once | INTRAMUSCULAR | Status: AC
  Administered 2011-11-29: 30 mg via INTRAVENOUS

## 2011-11-29 MED ORDER — DEXAMETHASONE SODIUM PHOSPHATE 10 MG/ML IJ SOLN
10.0000 mg | Freq: Once | INTRAMUSCULAR | Status: AC
  Administered 2011-11-29: 10 mg via INTRAVENOUS
  Filled 2011-11-29: qty 1

## 2011-11-29 MED ORDER — INSULIN ASPART 100 UNIT/ML ~~LOC~~ SOLN
0.0000 [IU] | Freq: Three times a day (TID) | SUBCUTANEOUS | Status: DC
  Administered 2011-11-29: 2 [IU] via SUBCUTANEOUS
  Administered 2011-11-30 (×2): 1 [IU] via SUBCUTANEOUS

## 2011-11-29 MED ORDER — PROMETHAZINE HCL 25 MG PO TABS
12.5000 mg | ORAL_TABLET | ORAL | Status: DC | PRN
  Administered 2011-11-29 – 2011-11-30 (×4): 12.5 mg via ORAL
  Filled 2011-11-29 (×4): qty 1

## 2011-11-29 NOTE — H&P (Signed)
Chief Complaint:  Left sided weekness  HPI: 56 yo female with h/o migraine headaches who has had a headache for the last 3 weeks persistently with sudden onset at 915p of left facial weakness and left arm/leg weakness and numbness.  No fevers or rashes at home.  Photophobia present.  Pt states that she use to have 2-3 migraines weekly and was started on amitryptiline and inderal over a year ago and this is only her 3 migraine this year.  However this migraine is much different than her previous migraines, she has never had any neurological deficits before.  She has also associated n/v which is normal.  Pt has neg cth and a stat mri has been ordered.  She received decadron about an hour ago which has not helped.  She normally recieves nubain and phenergan which helps.  No h/o htn or hld or dm.  She does have h/o bell's palsy in past which she had a full recovery from.  Review of Systems:  O/w neg  Past Medical History: Past Medical History  Diagnosis Date  . Migraines    Past Surgical History  Procedure Date  . Abdominal hysterectomy   . Cholecystectomy     Medications: Prior to Admission medications   Medication Sig Start Date End Date Taking? Authorizing Provider  amitriptyline (ELAVIL) 100 MG tablet Take 100 mg by mouth at bedtime.   Yes Historical Provider, MD  CALCIUM CARBONATE PO Take 1 tablet by mouth daily.   Yes Historical Provider, MD  Cholecalciferol (VITAMIN D PO) Take 1 tablet by mouth daily.   Yes Historical Provider, MD  HYDROcodone-acetaminophen (NORCO/VICODIN) 5-325 MG per tablet Take 1 tablet by mouth every 6 (six) hours as needed. For pain   Yes Historical Provider, MD  Multiple Vitamin (MULTIVITAMIN WITH MINERALS) TABS Take 1 tablet by mouth daily.   Yes Historical Provider, MD  omega-3 acid ethyl esters (LOVAZA) 1 G capsule Take 2 g by mouth daily.   Yes Historical Provider, MD  propranolol (INDERAL) 20 MG tablet Take 20 mg by mouth daily.   Yes Historical Provider,  MD  Pyridoxine HCl (VITAMIN B6 PO) Take 1 tablet by mouth daily.   Yes Historical Provider, MD  simvastatin (ZOCOR) 20 MG tablet Take 20 mg by mouth at bedtime.   Yes Historical Provider, MD    Allergies:   Allergies  Allergen Reactions  . Codeine Hives and Itching    REACTION: Dizziness  . Diazepam Other (See Comments)    REACTION: Behavioral problems  . Morphine Hives  . Niacin And Related Other (See Comments)    flushing    Social History:  reports that she has been smoking Cigarettes.  She has been smoking about .5 packs per day. She does not have any smokeless tobacco history on file. She reports that she drinks alcohol. She reports that she does not use illicit drugs.  Family History: History reviewed. No pertinent family history.  Physical Exam: Filed Vitals:   11/28/11 2238 11/28/11 2346  BP: 120/75   Pulse: 76   Temp:  98.6 F (37 C)  Resp: 16   SpO2: 100%    General appearance: alert, cooperative and no distress Neck: no JVD and supple, symmetrical, trachea midline Lungs: clear to auscultation bilaterally Heart: regular rate and rhythm, S1, S2 normal, no murmur, click, rub or gallop Abdomen: soft, non-tender; bowel sounds normal; no masses,  no organomegaly Extremities: extremities normal, atraumatic, no cyanosis or edema Pulses: 2+ and symmetric Skin: Skin color,  texture, turgor normal. No rashes or lesions Neurologic: Mental status: Alert, oriented, thought content appropriate cn all grossly intact but says the left side of her face "hurts" when she moves it, may be actually right lower face droop?? lle 4/5 strength lue 4/5 strength normal on right. Reflexes normal.  Labs on Admission:   [facility] 11/28/11 2254  NA 138  K 3.3*  CL 103  CO2 26  GLUCOSE 101*  BUN 15  CREATININE 1.15*  CALCIUM 9.2  MG --  PHOS --    Basename 11/28/11 2254  AST 18  ALT 14  ALKPHOS 69  BILITOT 0.2*  PROT 7.0  ALBUMIN 4.2    Basename 11/28/11 2254  WBC 6.2    NEUTROABS 3.0  HGB 12.4  HCT 36.6  MCV 90.4  PLT 231    Basename 11/28/11 2254  CKTOTAL --  CKMB --  CKMBINDEX --  TROPONINI <0.30   Radiological Exams on Admission: Ct Head Wo Contrast  11/28/2011  *RADIOLOGY REPORT*  Clinical Data: Code stroke.  Right-sided facial droop and weakness.  CT HEAD WITHOUT CONTRAST  Technique:  Contiguous axial images were obtained from the base of the skull through the vertex without contrast.  Comparison: CT head without contrast 07/07/2005.  Findings: No acute intracranial abnormality is present. Specifically, there is no evidence for acute infarct, hemorrhage, mass, hydrocephalus, or extra-axial fluid collection.  The paranasal sinuses and mastoid air cells are clear.  The globes and orbits are intact.  The osseous skull is intact.  IMPRESSION: Negative CT of the head.  These results were called by telephone on 11/28/2011 at 10:35 p.m. to April Pugh, RN; Rapid Response Team, who verbally acknowledged these results.   Original Report Authenticated By: Jamesetta Orleans. MATTERN, M.D.     Assessment/Plan Present on Admission:  56 yo female with left sided hemiparesis with migraine headache cannot r/o cva.  cva vs conversion d/o vs complex migraine .Weakness of left side of body .ANXIETY .DEPRESSION .Migraines  Pt getting stat mri right now.  If positive do full cva w/u.  Asa for now.  Usually nubain and phenergan break her migraine.  Will order that.  Has gotten one dose of decadron.  Neuro following.   Raylon Lamson A [phone] 11/29/2011, 12:27 AM

## 2011-11-29 NOTE — ED Notes (Signed)
Patient transported to MRI 

## 2011-11-29 NOTE — ED Notes (Signed)
Pt returned from MRI °

## 2011-11-29 NOTE — Progress Notes (Signed)
Pt still complaining of 8/10 headache after morning dose of inderal and depacon and Toradol IV.  MD notified about another form of pain management since Nubain is on back order per pharmacy.  No new orders received at this time.  Gave pt a cold pack to use on forehead.  Will continue to monitor.  Gardy Montanari, [place] Marie, RN

## 2011-11-29 NOTE — ED Notes (Addendum)
Pt c/o nausea - Dr. Ignacia Palma made aware, orders given for IV benadryl and reglan.

## 2011-11-29 NOTE — Progress Notes (Addendum)
[facility] PROGRESS NOTE  Jordan Ortega ZOX:[id number] DOB: 30-Nov-1955 DOA: 11/28/2011 PCP: Sheila Oats, MD  Assessment/Plan: 1. Headache/L sided weakness: MRI negative, ? Hemiplegic migraine, functional component, neurology following, will await Neuro input today to determine if complete stroke workup with MRA/ECHO/Carotid duplex needed here, received decadron x1 and toradol  last pm, will continue Amitriptyline, Propranolol and also continue ASA/Statin 2. ? Failed swallow eval with crackers last pm, but did ok with liquids: speech to reassess this am  Code Status: Full Family Communication: husband and son at bedside Disposition Plan: home   Consultants:  Neurology   HPI/Subjective: headache better 4-5/10, L side still weak but a little better  Objective: Filed Vitals:   11/29/11 0220 11/29/11 0415 11/29/11 0618 11/29/11 0915  BP: 116/69 106/61 96/59 98/64   Pulse: 71 58 66 64  Temp: 97.9 F (36.6 C) 97.8 F (36.6 C) 97.7 F (36.5 C)   TempSrc: Oral Oral Oral   Resp: 18 18 18    Height: 5' 0.5" (1.537 m)     Weight: 52.1 kg (114 lb 13.8 oz)     SpO2: 99% 99% 99%     Intake/Output Summary (Last 24 hours) at 11/29/11 0940 Last data filed at 11/29/11 0230  Gross per 24 hour  Intake      0 ml  Output    200 ml  Net   -200 ml   Filed Weights   11/29/11 0220  Weight: 52.1 kg (114 lb 13.8 oz)    Exam: General appearance: alert, cooperative and no distress  Neck: no JVD and supple, symmetrical, trachea midline  Lungs: clear to auscultation bilaterally  Heart: regular rate and rhythm, S1, S2 normal, no murmur, click, rub or gallop  Abdomen: soft, non-tender; bowel sounds normal; no masses, no organomegaly  Extremities: extremities normal, atraumatic, no cyanosis or edema  Pulses: 2+ and symmetric  Skin: Skin color, texture, turgor normal. No rashes or lesions  Neurologic: Mental status: Alert, oriented, thought content appropriate ,  Cranial nerves  grossly intact, motor 3-4/5 LLE  And 4/5 LUE, DTR 1 plus, reflexes diminishes b/l  Data Reviewed: Basic Metabolic Panel:  Lab 11/28/11 8119  NA 138  K 3.3*  CL 103  CO2 26  GLUCOSE 101*  BUN 15  CREATININE 1.15*  CALCIUM 9.2  MG --  PHOS --   Liver Function Tests:  Lab 11/28/11 2254  AST 18  ALT 14  ALKPHOS 69  BILITOT 0.2*  PROT 7.0  ALBUMIN 4.2   No results found for this basename: LIPASE:5,AMYLASE:5 in the last 168 hours No results found for this basename: AMMONIA:5 in the last 168 hours CBC:  Lab 11/28/11 2254  WBC 6.2  NEUTROABS 3.0  HGB 12.4  HCT 36.6  MCV 90.4  PLT 231   Cardiac Enzymes:  Lab 11/28/11 2254  CKTOTAL --  CKMB --  CKMBINDEX --  TROPONINI <0.30   BNP (last 3 results) No results found for this basename: PROBNP:3 in the last 8760 hours CBG:  Lab 11/28/11 2243  GLUCAP 92    No results found for this or any previous visit (from the past 240 hour(s)).   Studies: Ct Head Wo Contrast  11/28/2011  *RADIOLOGY REPORT*  Clinical Data: Code stroke.  Right-sided facial droop and weakness.  CT HEAD WITHOUT CONTRAST  Technique:  Contiguous axial images were obtained from the base of the skull through the vertex without contrast.  Comparison: CT head without contrast 07/07/2005.  Findings: No acute intracranial abnormality is  present. Specifically, there is no evidence for acute infarct, hemorrhage, mass, hydrocephalus, or extra-axial fluid collection.  The paranasal sinuses and mastoid air cells are clear.  The globes and orbits are intact.  The osseous skull is intact.  IMPRESSION: Negative CT of the head.  These results were called by telephone on 11/28/2011 at 10:35 p.m. to April Pugh, RN; Rapid Response Team, who verbally acknowledged these results.   Original Report Authenticated By: Jamesetta Orleans. MATTERN, M.D.    Mr Brain Wo Contrast  11/29/2011  *RADIOLOGY REPORT*  Clinical Data: Code stroke.  New onset left-sided weakness.  MRI HEAD  WITHOUT CONTRAST  Technique:  Multiplanar, multiecho pulse sequences of the brain and surrounding structures were obtained according to standard protocol without intravenous contrast.  Comparison: CT head without contrast 11/28/2011.  Findings: The diffusion weighted images demonstrate no evidence for acute or subacute infarction.  Minimal white matter changes likely within normal limits for age.  No acute hemorrhage, mass lesion, or hemorrhage is present.  The ventricles are of normal size.  No significant extra-axial fluid collection is present.  Flow is present in the major intracranial arteries.  The globes and orbits are intact.  The paranasal sinuses and mastoid air cells are clear.  IMPRESSION: Negative MRI of the brain for age.  These results were called by telephone on 11/29/2011 at 01:45 a.m. to Dr. Olin Pia, who verbally acknowledged these results.   Original Report Authenticated By: Jamesetta Orleans. MATTERN, M.D.     Scheduled Meds:   . amitriptyline  100 mg Oral QHS  . aspirin EC  81 mg Oral Daily  . dexamethasone  10 mg Intravenous Once  . diphenhydrAMINE  25 mg Intravenous Once  . influenza  inactive virus vaccine  0.5 mL Intramuscular Tomorrow-1000  . ketorolac  30 mg Intravenous Once  . metoCLOPramide (REGLAN) injection  10 mg Intravenous Once  . propranolol  20 mg Oral Daily  . sodium chloride  3 mL Intravenous Q12H   Continuous Infusions:   . sodium chloride 160 mL/hr at 11/29/11 0353  . 0.9 % NaCl with KCl 20 mEq / L      Principal Problem:  *Weakness of left side of body Active Problems:  ANXIETY  DEPRESSION  Migraines    Time spent:    [facility]  [facility] Pager [phone] If 8PM-8AM, please contact night-coverage at [email or web address], password [facility] 11/29/2011, 9:40 AM  LOS: 1 day

## 2011-11-29 NOTE — ED Provider Notes (Signed)
History     CSN: [id number]  Arrival date & time 11/28/11  2218   First MD Initiated Contact with Patient 11/28/11 2251      Chief Complaint  Patient presents with  . Code Stroke    (Consider location/radiation/quality/duration/timing/severity/associated sxs/prior treatment) HPI Comments: Pt is a 56 yo woman who had onset of weakness in the left side of her face and the left arm and leg about 9:30 P.M.  She was brought to [facility] ED via EMS as a Code Stroke.  I saw her at the triage desk and had found her to have left facial and left arm and left leg weakness.   Patient is a 56 y.o. female presenting with Acute Neurological Problem. The history is provided by the patient. No language interpreter was used.  Cerebrovascular Accident    Past Medical History  Diagnosis Date  . Migraines     Past Surgical History  Procedure Date  . Abdominal hysterectomy   . Cholecystectomy     History reviewed. No pertinent family history.  History  Substance Use Topics  . Smoking status: Current Every Day Smoker -- 0.5 packs/day    Types: Cigarettes  . Smokeless tobacco: Not on file  . Alcohol Use: Yes     occasional    OB History    Grav Para Term Preterm Abortions TAB SAB Ect Mult Living                  Review of Systems  HENT: Negative.   Eyes: Negative.   Respiratory: Negative.   Cardiovascular: Negative.   Gastrointestinal: Negative.   Genitourinary: Negative.   Musculoskeletal: Negative.   Skin: Negative.   Neurological: Positive for facial asymmetry and weakness.  Psychiatric/Behavioral: Negative.     Allergies  Codeine; Diazepam; Morphine; and Niacin and related  Home Medications   Current Outpatient Rx  Name Route Sig Dispense Refill  . AMITRIPTYLINE HCL 100 MG PO TABS Oral Take 100 mg by mouth at bedtime.    Marland Kitchen CALCIUM CARBONATE PO Oral Take 1 tablet by mouth daily.    Marland Kitchen VITAMIN D PO Oral Take 1 tablet by mouth daily.    Marland Kitchen HYDROCODONE-ACETAMINOPHEN  5-325 MG PO TABS Oral Take 1 tablet by mouth every 6 (six) hours as needed. For pain    . ADULT MULTIVITAMIN W/MINERALS CH Oral Take 1 tablet by mouth daily.    . OMEGA-3-ACID ETHYL ESTERS 1 G PO CAPS Oral Take 2 g by mouth daily.    Marland Kitchen PROPRANOLOL HCL 20 MG PO TABS Oral Take 20 mg by mouth daily.    Marland Kitchen VITAMIN B6 PO Oral Take 1 tablet by mouth daily.    Marland Kitchen SIMVASTATIN 20 MG PO TABS Oral Take 20 mg by mouth at bedtime.      BP 120/75  Pulse 76  Temp 98.6 F (37 C)  Resp 16  SpO2 100%  Physical Exam  Nursing note and vitals reviewed. Constitutional: She is oriented to person, place, and time. She appears well-developed and well-nourished.       In moderate distress, complaining of weakness in the left face and left side of her body.  HENT:  Head: Normocephalic and atraumatic.  Right Ear: External ear normal.  Left Ear: External ear normal.  Mouth/Throat: Oropharynx is clear and moist.       Left facial weakness.  Eyes: Conjunctivae normal and EOM are normal. Pupils are equal, round, and reactive to light.  Neck: Normal range of  motion. Neck supple.  Cardiovascular: Normal rate, regular rhythm and normal heart sounds.   Pulmonary/Chest: Effort normal and breath sounds normal.  Abdominal: Soft. Bowel sounds are normal.  Musculoskeletal:       Right arm and leg move normally.  Neurological: She is alert and oriented to person, place, and time.       Left sided weakness in both arm and leg.  Skin: Skin is warm and dry.  Psychiatric: She has a normal mood and affect. Her behavior is normal.    ED Course  Procedures (including critical care time)  Labs Reviewed  COMPREHENSIVE METABOLIC PANEL - Abnormal; Notable for the following:    Potassium 3.3 (*)     Glucose, Bld 101 (*)     Creatinine, Ser 1.15 (*)     Total Bilirubin 0.2 (*)     GFR calc non Af Amer 53 (*)     GFR calc Af Amer 61 (*)     All other components within normal limits  GLUCOSE, CAPILLARY  PROTIME-INR  APTT    CBC  DIFFERENTIAL  TROPONIN I  CBC WITH DIFFERENTIAL  COMPREHENSIVE METABOLIC PANEL  PROTIME-INR  APTT   Ct Head Wo Contrast  11/28/2011  *RADIOLOGY REPORT*  Clinical Data: Code stroke.  Right-sided facial droop and weakness.  CT HEAD WITHOUT CONTRAST  Technique:  Contiguous axial images were obtained from the base of the skull through the vertex without contrast.  Comparison: CT head without contrast 07/07/2005.  Findings: No acute intracranial abnormality is present. Specifically, there is no evidence for acute infarct, hemorrhage, mass, hydrocephalus, or extra-axial fluid collection.  The paranasal sinuses and mastoid air cells are clear.  The globes and orbits are intact.  The osseous skull is intact.  IMPRESSION: Negative CT of the head.  These results were called by telephone on 11/28/2011 at 10:35 p.m. to April Pugh, RN; Rapid Response Team, who verbally acknowledged these results.   Original Report Authenticated By: Jamesetta Orleans. MATTERN, M.D.    Case discussed with Neurology --> MRI of brain ordered to determine if pt had ischemic stroke or if this is an atypical migraine.  Also discussed with Dr. Alfredo Batty, radiologist.  Call to Dr. Onalee Hua, [facility], who accepts pt for admission to a telemetry bed.  1. Migraine   2. Weakness of left side of body          Carleene Cooper III, MD 11/29/11 0110

## 2011-11-29 NOTE — Progress Notes (Signed)
Subjective: Patient reports that her weakness and numbness has improved but she continues to have her headache which she rates at a  9/10.  She is tearful and concerned that the stress and hours that she works may have contributed to her having her current problems.    Objective: Current vital signs: BP 98/64  Pulse 64  Temp 97.7 F (36.5 C) (Oral)  Resp 18  Ht 5' 0.5" (1.537 m)  Wt 52.1 kg (114 lb 13.8 oz)  BMI 22.06 kg/m2  SpO2 99% Vital signs in last 24 hours: Temp:  [97.7 F (36.5 C)-98.6 F (37 C)] 97.7 F (36.5 C) (10/28 0618) Pulse Rate:  [58-76] 64  (10/28 0915) Resp:  [13-21] 18  (10/28 0618) BP: (96-126)/(59-84) 98/64 mmHg (10/28 0915) SpO2:  [96 %-100 %] 99 % (10/28 0618) Weight:  [52.1 kg (114 lb 13.8 oz)] 52.1 kg (114 lb 13.8 oz) (10/28 0220)  Intake/Output from previous day: 10/27 0701 - 10/28 0700 In: -  Out: 200 [Urine:200] Intake/Output this shift:   Nutritional status: Clear Liquid  Neurologic Exam: Mental Status: Alert, oriented, thought content appropriate.  Speech fluent without evidence of aphasia.  Able to follow 3 step commands without difficulty. Cranial Nerves: II: Discs flat bilaterally; Visual fields grossly normal, pupils equal, round, reactive to light and accommodation III,IV, VI: ptosis not present, extra-ocular motions intact bilaterally V,VII: pulls mouth to right with smile but with speaking uses face symmetrically VIII: hearing normal bilaterally IX,X: gag reflex present XI: bilateral shoulder shrug XII: midline tongue extension Motor: Right : Upper extremity   5/5    Left:     Upper extremity   5/5, no drift  Lower extremity   5/5     Lower extremity   5-/5 Tone and bulk:normal tone throughout; no atrophy noted Deep Tendon Reflexes: 2+ and symmetric throughout Plantars: Right: downgoing   Left: downgoing Cerebellar: normal finger-to-nose, normal rapid alternating movements and normal heel-to-shin test CV: pulses palpable  throughout   Lab Results: Basic Metabolic Panel:  Lab 11/28/11 8841  NA 138  K 3.3*  CL 103  CO2 26  GLUCOSE 101*  BUN 15  CREATININE 1.15*  CALCIUM 9.2  MG --  PHOS --    Liver Function Tests:  Lab 11/28/11 2254  AST 18  ALT 14  ALKPHOS 69  BILITOT 0.2*  PROT 7.0  ALBUMIN 4.2   No results found for this basename: LIPASE:5,AMYLASE:5 in the last 168 hours No results found for this basename: AMMONIA:3 in the last 168 hours  CBC:  Lab 11/28/11 2254  WBC 6.2  NEUTROABS 3.0  HGB 12.4  HCT 36.6  MCV 90.4  PLT 231    Cardiac Enzymes:  Lab 11/28/11 2254  CKTOTAL --  CKMB --  CKMBINDEX --  TROPONINI <0.30    Lipid Panel: No results found for this basename: CHOL:5,TRIG:5,HDL:5,CHOLHDL:5,VLDL:5,LDLCALC:5 in the last 168 hours  CBG:  Lab 11/28/11 2243  GLUCAP 92    Microbiology: No results found for this or any previous visit.  Coagulation Studies:  Basename 11/28/11 2254  LABPROT 13.4  INR 1.03    Imaging: Ct Head Wo Contrast  11/28/2011  *RADIOLOGY REPORT*  Clinical Data: Code stroke.  Right-sided facial droop and weakness.  CT HEAD WITHOUT CONTRAST  Technique:  Contiguous axial images were obtained from the base of the skull through the vertex without contrast.  Comparison: CT head without contrast 07/07/2005.  Findings: No acute intracranial abnormality is present. Specifically, there is no evidence  for acute infarct, hemorrhage, mass, hydrocephalus, or extra-axial fluid collection.  The paranasal sinuses and mastoid air cells are clear.  The globes and orbits are intact.  The osseous skull is intact.  IMPRESSION: Negative CT of the head.  These results were called by telephone on 11/28/2011 at 10:35 p.m. to April Pugh, RN; Rapid Response Team, who verbally acknowledged these results.   Original Report Authenticated By: Jamesetta Orleans. MATTERN, M.D.    Mr Brain Wo Contrast  11/29/2011  *RADIOLOGY REPORT*  Clinical Data: Code stroke.  New onset  left-sided weakness.  MRI HEAD WITHOUT CONTRAST  Technique:  Multiplanar, multiecho pulse sequences of the brain and surrounding structures were obtained according to standard protocol without intravenous contrast.  Comparison: CT head without contrast 11/28/2011.  Findings: The diffusion weighted images demonstrate no evidence for acute or subacute infarction.  Minimal white matter changes likely within normal limits for age.  No acute hemorrhage, mass lesion, or hemorrhage is present.  The ventricles are of normal size.  No significant extra-axial fluid collection is present.  Flow is present in the major intracranial arteries.  The globes and orbits are intact.  The paranasal sinuses and mastoid air cells are clear.  IMPRESSION: Negative MRI of the brain for age.  These results were called by telephone on 11/29/2011 at 01:45 a.m. to Dr. Olin Pia, who verbally acknowledged these results.   Original Report Authenticated By: Jamesetta Orleans. MATTERN, M.D.     Medications:  I have reviewed the patient's current medications. Scheduled:   . amitriptyline  100 mg Oral QHS  . aspirin EC  81 mg Oral Daily  . dexamethasone  10 mg Intravenous Once  . diphenhydrAMINE  25 mg Intravenous Once  . influenza  inactive virus vaccine  0.5 mL Intramuscular Tomorrow-1000  . insulin aspart  0-9 Units Subcutaneous TID WC  . ketorolac  30 mg Intravenous Once  . metoCLOPramide (REGLAN) injection  10 mg Intravenous Once  . propranolol  20 mg Oral Daily  . sodium chloride  3 mL Intravenous Q12H    Assessment/Plan:  Patient Active Hospital Problem List: Weakness of left side of body (11/28/2011)   Assessment: Improved.  MRI of the brain performed and shows no evidence of an acute ischemic lesion or any other focal abnormalities.     Plan:  1.  Would not recommend a stroke work up at this time.  No further interventions recommended.  Migraines ()   Assessment: Patient reports a long history of headaches but has  never had associated left sided complaints and feels stress may be related.  I agree.  Headache remains significant per her rating.      Plan:  1. Depacon 500mg  IV now for pain relief.  May repeat 1-2 more times if needed and patient seems to respond.    LOS: 1 day   Thana Farr, MD [facility] [phone] 11/29/2011  10:53 AM

## 2011-11-30 LAB — GLUCOSE, CAPILLARY
Glucose-Capillary: 122 mg/dL — ABNORMAL HIGH (ref 70–99)
Glucose-Capillary: 149 mg/dL — ABNORMAL HIGH (ref 70–99)

## 2011-11-30 MED ORDER — AMITRIPTYLINE HCL 75 MG PO TABS
150.0000 mg | ORAL_TABLET | Freq: Every day | ORAL | Status: DC
  Filled 2011-11-30: qty 2

## 2011-11-30 MED ORDER — TRAMADOL HCL 50 MG PO TABS
50.0000 mg | ORAL_TABLET | Freq: Four times a day (QID) | ORAL | Status: DC | PRN
  Administered 2011-11-30: 50 mg via ORAL
  Filled 2011-11-30: qty 1

## 2011-11-30 MED ORDER — UNABLE TO FIND: Status: DC

## 2011-11-30 MED ORDER — TRAMADOL HCL 50 MG PO TABS: 50.0000 mg | ORAL_TABLET | Freq: Four times a day (QID) | ORAL | Status: DC | PRN

## 2011-11-30 MED ORDER — PROMETHAZINE HCL 25 MG/ML IJ SOLN
12.5000 mg | Freq: Once | INTRAMUSCULAR | Status: AC
  Administered 2011-11-30: 12.5 mg via INTRAVENOUS
  Filled 2011-11-30: qty 1

## 2011-11-30 MED ORDER — SUMATRIPTAN SUCCINATE 6 MG/0.5ML ~~LOC~~ SOLN
6.0000 mg | Freq: Once | SUBCUTANEOUS | Status: AC
  Administered 2011-11-30: 6 mg via SUBCUTANEOUS
  Filled 2011-11-30: qty 0.5

## 2011-11-30 MED ORDER — PROMETHAZINE HCL 12.5 MG PO TABS: 12.5000 mg | ORAL_TABLET | ORAL | Status: DC | PRN

## 2011-11-30 MED ORDER — DIPHENHYDRAMINE HCL 50 MG/ML IJ SOLN: 25.0000 mg | Freq: Once | INTRAMUSCULAR | Status: DC

## 2011-11-30 MED ORDER — SUMATRIPTAN SUCCINATE 6 MG/0.5ML ~~LOC~~ SOLN
6.0000 mg | Freq: Once | SUBCUTANEOUS | Status: DC
  Filled 2011-11-30: qty 0.5

## 2011-11-30 MED ORDER — AMITRIPTYLINE HCL 150 MG PO TABS: 150.0000 mg | ORAL_TABLET | Freq: Every day | ORAL | Status: DC

## 2011-11-30 NOTE — Care Management Note (Signed)
    Page 1 of 1   11/30/2011     4:09:24 PM   CARE MANAGEMENT NOTE 11/30/2011  Patient:  Jordan Ortega, Jordan Ortega   Account Number:  [id number]  Date Initiated:  11/30/2011  Documentation initiated by:  [facility]  Subjective/Objective Assessment:   headache syndrome     Action/Plan:   lives at home with husband   Anticipated DC Date:  11/30/2011   Anticipated DC Plan:  HOME/SELF CARE      DC Planning Services  CM consult      Choice offered to / List presented to:             Status of service:  Completed, signed off Medicare Important Message given?   (If response is "NO", the following Medicare IM given date fields will be blank) Date Medicare IM given:   Date Additional Medicare IM given:    Discharge Disposition:  HOME/SELF CARE  Per UR Regulation:    If discussed at Long Length of Stay Meetings, dates discussed:    Comments:  11/30/2011 1530 NCM provided pt with community resources and info on PCP that accept self pay pts. Provided pt with community discount card that may assist with meds paid out of pocket. Pt eligible for ZZ fund but did not use this admission. Isidoro Donning RN CCM Case Mgmt phone [phone]

## 2011-11-30 NOTE — Progress Notes (Addendum)
Subjective: Patient ambulating with therapy without a device.  Reports headache responded best to Imitrex.  Currently headache at a 6/10.  In transition to po medications.  Objective: Current vital signs: BP 121/69  Pulse 76  Temp 97.5 F (36.4 C) (Oral)  Resp 18  Ht 5' 0.5" (1.537 m)  Wt 52.1 kg (114 lb 13.8 oz)  BMI 22.06 kg/m2  SpO2 100% Vital signs in last 24 hours: Temp:  [97.5 F (36.4 C)-98.1 F (36.7 C)] 97.5 F (36.4 C) (10/29 0600) Pulse Rate:  [74-80] 76  (10/29 0600) Resp:  [18] 18  (10/29 0600) BP: (96-121)/(49-78) 121/69 mmHg (10/29 0600) SpO2:  [96 %-100 %] 100 % (10/29 0600)  Intake/Output from previous day:   Intake/Output this shift:   Nutritional status: General  Neurologic Exam: Mental Status:  Alert, oriented, thought content appropriate. Speech fluent without evidence of aphasia. Able to follow 3 step commands without difficulty.  Cranial Nerves:  II: Discs flat bilaterally; Visual fields grossly normal, pupils equal, round, reactive to light and accommodation  III,IV, VI: ptosis not present, extra-ocular motions intact bilaterally  V,VII: pulls mouth to right with smile but with speaking uses face symmetrically  VIII: hearing normal bilaterally  IX,X: gag reflex present  XI: bilateral shoulder shrug  XII: midline tongue extension  Motor:  Arm swing symmetric with gait.   Tone and bulk:normal tone throughout; no atrophy noted  Deep Tendon Reflexes: 2+ and symmetric throughout  Plantars:  Right: downgoing    Left: downgoing  Cerebellar:  Gait normal with external deviation of extremities or dragging of any extremities.   CV: pulses palpable throughout    Lab Results: Basic Metabolic Panel:  Lab 11/28/11 1610  NA 138  K 3.3*  CL 103  CO2 26  GLUCOSE 101*  BUN 15  CREATININE 1.15*  CALCIUM 9.2  MG --  PHOS --    Liver Function Tests:  Lab 11/28/11 2254  AST 18  ALT 14  ALKPHOS 69  BILITOT 0.2*  PROT 7.0  ALBUMIN 4.2   No  results found for this basename: LIPASE:5,AMYLASE:5 in the last 168 hours No results found for this basename: AMMONIA:3 in the last 168 hours  CBC:  Lab 11/28/11 2254  WBC 6.2  NEUTROABS 3.0  HGB 12.4  HCT 36.6  MCV 90.4  PLT 231    Cardiac Enzymes:  Lab 11/28/11 2254  CKTOTAL --  CKMB --  CKMBINDEX --  TROPONINI <0.30    Lipid Panel: No results found for this basename: CHOL:5,TRIG:5,HDL:5,CHOLHDL:5,VLDL:5,LDLCALC:5 in the last 168 hours  CBG:  Lab 11/30/11 1132 11/30/11 0638 11/29/11 2140 11/29/11 1240 11/28/11 2243  GLUCAP 149* 122* 107* 155* 92    Microbiology: No results found for this or any previous visit.  Coagulation Studies:  Basename 11/28/11 2254  LABPROT 13.4  INR 1.03    Imaging: Ct Head Wo Contrast  11/28/2011  *RADIOLOGY REPORT*  Clinical Data: Code stroke.  Right-sided facial droop and weakness.  CT HEAD WITHOUT CONTRAST  Technique:  Contiguous axial images were obtained from the base of the skull through the vertex without contrast.  Comparison: CT head without contrast 07/07/2005.  Findings: No acute intracranial abnormality is present. Specifically, there is no evidence for acute infarct, hemorrhage, mass, hydrocephalus, or extra-axial fluid collection.  The paranasal sinuses and mastoid air cells are clear.  The globes and orbits are intact.  The osseous skull is intact.  IMPRESSION: Negative CT of the head.  These results were called by  telephone on 11/28/2011 at 10:35 p.m. to April Pugh, RN; Rapid Response Team, who verbally acknowledged these results.   Original Report Authenticated By: Jamesetta Orleans. MATTERN, M.D.    Mr Brain Wo Contrast  11/29/2011  *RADIOLOGY REPORT*  Clinical Data: Code stroke.  New onset left-sided weakness.  MRI HEAD WITHOUT CONTRAST  Technique:  Multiplanar, multiecho pulse sequences of the brain and surrounding structures were obtained according to standard protocol without intravenous contrast.  Comparison: CT head  without contrast 11/28/2011.  Findings: The diffusion weighted images demonstrate no evidence for acute or subacute infarction.  Minimal white matter changes likely within normal limits for age.  No acute hemorrhage, mass lesion, or hemorrhage is present.  The ventricles are of normal size.  No significant extra-axial fluid collection is present.  Flow is present in the major intracranial arteries.  The globes and orbits are intact.  The paranasal sinuses and mastoid air cells are clear.  IMPRESSION: Negative MRI of the brain for age.  These results were called by telephone on 11/29/2011 at 01:45 a.m. to Dr. Olin Pia, who verbally acknowledged these results.   Original Report Authenticated By: Jamesetta Orleans. MATTERN, M.D.     Medications:  I have reviewed the patient's current medications. Scheduled:   . amitriptyline  150 mg Oral QHS  . aspirin EC  81 mg Oral Daily  . dexamethasone  10 mg Intravenous Once  . diphenhydrAMINE  25 mg Intravenous Once  . diphenhydrAMINE  25 mg Intravenous Once  . influenza  inactive virus vaccine  0.5 mL Intramuscular Tomorrow-1000  . insulin aspart  0-9 Units Subcutaneous TID WC  . ketorolac  15 mg Intravenous Once  . ketorolac  30 mg Intravenous Once  . ketorolac      . ketorolac      . metoCLOPramide (REGLAN) injection  10 mg Intravenous Once  . promethazine  12.5 mg Intravenous Once  . propranolol  20 mg Oral Daily  . sodium chloride  3 mL Intravenous Q12H  . SUMAtriptan  6 mg Subcutaneous Once  . valproate sodium  500 mg Intravenous Once  . DISCONTD: amitriptyline  100 mg Oral QHS  . DISCONTD: dexamethasone  10 mg Intravenous Once  . DISCONTD: ketorolac  30 mg Intravenous Once  . DISCONTD: SUMAtriptan  6 mg Subcutaneous Once    Assessment/Plan:  Patient Active Hospital Problem List: Weakness of left side of body (11/28/2011)   Assessment: Improved.  Patient ambulating.  Most likely part of headache syndrome   Plan: No further work up  recommended  Migraines ()   Assessment: Due to focal nature of presentation would refrain from use of vasoconstrictors although they seemed helpful for the patient overnight.  It does not seem that Depakote was particularly helpful.     Plan:  1.  Recommend Ultram 50-100mg  prn headache.  May use phenergan prn as well.   2.  Increased Elavi to 150mg  qhs for prophylaxis.    Case discussed with Dr. Jomarie Longs.  No further neurologic intervention is recommended at this time.  If further questions arise, please call or page at that time.  Thank you for allowing neurology to participate in the care of this patient.    LOS: 2 days   Thana Farr, MD [facility] [phone] 11/30/2011  11:47 AM

## 2011-11-30 NOTE — Evaluation (Addendum)
Clinical/Bedside Swallow Evaluation Patient Details  Name: Jordan Ortega MRN: [id number] Date of Birth: 16-Jun-1955  Today's Date: 11/30/2011 Time: 1002-1025 SLP Time Calculation (min): 23 min  Past Medical History:  Past Medical History  Diagnosis Date  . Migraines   . Bell palsy    Past Surgical History:  Past Surgical History  Procedure Date  . Abdominal hysterectomy   . Cholecystectomy    HPI:  56 yo female adm to [facility] 11/28/11 with left sided weakness and numbness, diagnosed with hemiparetic migraine.  Pt CT Head 10/27 negative, Head MRI 10/28 negative.  Pt has h/o migraines and Barrett's esophagus (per her statement).  medication list includes omeprazole (at home per pt), amitriptyline and reglan - pt denies xerostomia in general.  Failure of RN stroke swallow screen noted due to difficulties with cracker - she has been tolerating a liquid diet.  Pt stated cracker felt as if "it just got bigger in mouth".  Numbness and weakness (left) have resolved.    Assessment / Plan / Recommendation Clinical Impression  Pt presents with functional oropharyngeal swallow ability, she reports issues with solids has resolved.  Pt observed to consume saltine crackers, applesauce and gingerale without clinical indicators of aspiration/penetration or dysphagia.  Given known h/o Barrett's esophagus and reflux medication usage, SLP provided pt with reflux and xerostomia precautions.  SLP to sign off.  Thanks for the consult.      Aspiration Risk  Mild    Diet Recommendation Regular;Thin liquid   Liquid Administration via: Cup;Straw Medication Administration: Whole meds with liquid Supervision: Patient able to self feed Compensations: Slow rate;Small sips/bites (drink liquids throughout meal)    Other  Recommendations Oral Care Recommendations: Oral care BID   Follow Up Recommendations  None    Frequency and Duration        Pertinent Vitals/Pain Afebrile, clear    SLP Swallow Goals  none, eval ,educate and d/c   Swallow Study Prior Functional Status   see general below    General HPI: 56 yo female adm to [facility] 11/28/11 with left sided weakness and numbness, diagnosed with hemiparetic migraine.  Pt CT Head 10/27 negative, Head MRI 10/28 negative.  Pt has h/o migraines and Barrett's esophagus (per her statement).  medication list includes omeprazole (at home per pt), amitriptyline and reglan - pt denies xerostomia in general.  Failure of RN stroke swallow screen noted due to difficulties with cracker - she has been tolerating a liquid diet.  Pt stated cracker felt as if "it just got bigger in mouth".  Numbness and weakness (left) have resolved.  Previous Swallow Assessment: endoscopic eval in past diagnosed with Barrett's per pt, + hpylori Diet Prior to this Study: Thin liquids Temperature Spikes Noted: No Respiratory Status: Room air History of Recent Intubation: No Behavior/Cognition: Alert;Cooperative;Pleasant mood Oral Cavity - Dentition: Adequate natural dentition Self-Feeding Abilities: Able to feed self Patient Positioning: Upright in bed Baseline Vocal Quality: Clear Volitional Cough: Strong Volitional Swallow: Able to elicit    Oral/Motor/Sensory Function Overall Oral Motor/Sensory Function: Appears within functional limits for tasks assessed   Ice Chips Ice chips: Not tested   Thin Liquid Thin Liquid: Within functional limits Presentation: Cup;Straw    Nectar Thick Nectar Thick Liquid: Not tested   Honey Thick Honey Thick Liquid: Not tested   Puree Presentation: Self Fed;Spoon   Solid   GO    Solid: Within functional limits Presentation: Self Lisabeth Pick, MS [facility] SLP [phone]

## 2011-11-30 NOTE — Progress Notes (Signed)
Pt given D/C instructions with verbal understanding. Pt c/o HA @ D/C, MD aware, Pt stated "she was ready for D/C and that she would feel better once she was home". Pt D/C'd home via wheelchair @ 1525 per MD order with husband. Rema Fendt, RN

## 2011-11-30 NOTE — Progress Notes (Signed)
Physical Therapy Treatment Patient Details Name: Jordan Ortega MRN: [id number] DOB: [birth year] Today's Date: 11/30/2011 Time: 4098-1191 PT Time Calculation (min): 14 min  PT Assessment / Plan / Recommendation Comments on Treatment Session  Patient limited by HA this session but was agreeable to hall ambulation. Patient mobilizing well. States her dad has a walker if she feels unsteady. I do not feel as if she will need to use it.     Follow Up Recommendations  No PT follow up     Does the patient have the potential to tolerate intense rehabilitation     Barriers to Discharge        Equipment Recommendations  None recommended by PT    Recommendations for Other Services    Frequency Min 3X/week   Plan Discharge plan remains appropriate;Frequency remains appropriate    Precautions / Restrictions Precautions Precautions: Fall   Pertinent Vitals/Pain     Mobility  Bed Mobility Rolling Left: 6: Modified independent (Device/Increase time) Left Sidelying to Sit: 6: Modified independent (Device/Increase time) Sitting - Scoot to Edge of Bed: 6: Modified independent (Device/Increase time) Sit to Supine: 6: Modified independent (Device/Increase time) Transfers Sit to Stand: 6: Modified independent (Device/Increase time) Stand Pivot Transfers: 6: Modified independent (Device/Increase time) Ambulation/Gait Ambulation/Gait Assistance: 4: Min guard Ambulation Distance (Feet): 200 Feet Assistive device: None Ambulation/Gait Assistance Details: Patient swayying slightly initially but no LOB Gait Pattern: Step-through pattern;Narrow base of support    Exercises     PT Diagnosis:    PT Problem List:   PT Treatment Interventions:     PT Goals    Visit Information  Last PT Received On: 11/30/11 Assistance Needed: +1    Subjective Data      Cognition  Overall Cognitive Status: Appears within functional limits for tasks assessed/performed Arousal/Alertness:  Awake/alert Orientation Level: Appears intact for tasks assessed Behavior During Session: [facility] for tasks performed    Balance     End of Session PT - End of Session Equipment Utilized During Treatment: Gait belt Activity Tolerance: Patient tolerated treatment well;Patient limited by pain Patient left: in bed;with call bell/phone within reach;with bed alarm set Nurse Communication: Mobility status   GP     Fredrich Birks 11/30/2011, 1:13 PM  11/30/2011 Fredrich Birks PTA [phone] pager [phone] office

## 2011-11-30 NOTE — Clinical Social Work Psychosocial (Signed)
     Clinical Social Work Department BRIEF PSYCHOSOCIAL ASSESSMENT 11/30/2011  Patient:  Jordan Ortega, Jordan Ortega     Account Number:  [id number]     Admit date:  11/28/2011  Clinical Social Worker:  Peggyann Shoals  Date/Time:  11/30/2011 01:53 PM  Referred by:  Physician  Date Referred:  11/29/2011 Referred for  Other - See comment   Other Referral:   Pain Mgt for migraines, community support systems for TIA/ Stroke   Interview type:  Patient Other interview type:    PSYCHOSOCIAL DATA Living Status:  HUSBAND Admitted from facility:   Level of care:   Primary support name:  Ysidro Evert Primary support relationship to patient:  SPOUSE Degree of support available:   Supportive    CURRENT CONCERNS Current Concerns  Adjustment to Illness   Other Concerns:    SOCIAL WORK ASSESSMENT / PLAN CSW met with pt to address consult. CSW introduced herself and explained role of social work. Pt is ready for discharge today.    Pt shared that she has an established psychiatrist, however, she no longer sees him. Pt used to see a therapist. Pt stated that she is able to access these services, if she needed the services. Pt shares that she only takes medication to manage her migraines.    CSW offered resources regarding stroke, however they were declined. CSW is signing off as no further needs are identified.   Assessment/plan status:  No Further Intervention Required Other assessment/ plan:   Information/referral to community resources:   Resources were declined.    PATIENTS/FAMILYS RESPONSE TO PLAN OF CARE: Pt was alert and oriented. Pt thanked CSW for intervention. Pt is looking forward to discharging home today.

## 2011-11-30 NOTE — Progress Notes (Signed)
Physical Therapy Evaluation (late entry for 11/29/11 16:24 as note not created at that time)   11/29/11 1624  PT Visit Information  Last PT Received On 11/29/11  Assistance Needed +1  PT Time Calculation  PT Start Time 1604  PT Stop Time 1621  PT Time Calculation (min) 17 min  Subjective Data  Subjective Reports her headache has not really improved, but her strength and nausea are better.  Patient Stated Goal decr headache and regain strength  Precautions  Precautions Fall  Home Living  Home Adaptive Equipment None (can borrow RW from her father (he does not use anymore))  Additional Comments Home environment TBA-pt with severe headache   Prior Function  Level of Independence Independent  Able to Take Stairs? Reciprically  Driving Yes  Communication  Communication No difficulties  Cognition  Overall Cognitive Status Appears within functional limits for tasks assessed/performed  Arousal/Alertness Lethargic  Orientation Level Appears intact for tasks assessed  Behavior During Session Anxious (requested lights off due to headache)  Right Lower Extremity Assessment  RLE ROM/Strength/Tone [facility]  RLE Sensation WFL - Light Touch  RLE Coordination WFL - gross motor  Left Lower Extremity Assessment  LLE ROM/Strength/Tone Deficits  LLE ROM/Strength/Tone Deficits Pt with inconsistent effort with strength testing of knee extensors, knee flexors, and ankle DF (pt with "catches" of strength and then gives away with MMT); functionally, pt with no knee buckling or drop foot  LLE Sensation Deficits  LLE Sensation Deficits reports numbness on bottom of foot  LLE Coordination (poor effort)  Trunk Assessment  Trunk Assessment Normal  Bed Mobility  Bed Mobility Rolling Left;Left Sidelying to Sit;Sitting - Scoot to Edge of Bed;Sit to Supine  Rolling Left 6: Modified independent (Device/Increase time);With rail  Left Sidelying to Sit 5: Supervision;HOB flat;With rails  Sitting - Scoot to Edge  of Bed 6: Modified independent (Device/Increase time);With rail  Sit to Supine 6: Modified independent (Device/Increase time);HOB flat  Details for Bed Mobility Assistance supervision due to IV line tangled in her gown  Transfers  Transfers Sit to Stand;Stand to Sit;Stand Pivot Transfers  Sit to Stand 4: Min guard;With upper extremity assist;From bed;From chair/3-in-1;With armrests  Stand to Sit 4: Min guard;With upper extremity assist;With armrests;To bed;To chair/3-in-1  Stand Pivot Transfers 4: Min guard  Details for Transfer Assistance Pt initially hesitant to put weight on her LLE, coming to stand shifting her weight over her RLE. As she saw that LLE would support her, began to take bigger steps with each LE  Ambulation/Gait  Ambulation/Gait Assistance 4: Min guard  Ambulation Distance (Feet) 8 Feet  Assistive device None  Ambulation/Gait Assistance Details walked around end of bed from [facility] to enter other side of bed; pt hesitant, however without buckling  Gait Pattern Step-through pattern;Decreased stride length;Decreased weight shift to left;Shuffle  Balance  Balance Assessed Yes  Static Sitting Balance  Static Sitting - Balance Support No upper extremity supported;Feet unsupported  Static Sitting - Level of Assistance 7: Independent  Static Standing Balance  Static Standing - Balance Support Left upper extremity supported  Static Standing - Level of Assistance 5: Stand by assistance  PT - End of Session  Equipment Utilized During Treatment Gait belt  Activity Tolerance Patient limited by pain  Patient left in bed;with call bell/phone within reach;with bed alarm set  PT Assessment  Clinical Impression Statement 56 yo with apparent complex migraine with Lt sided weakness and numbness (MRI negative). Pt reports strength has improved and anticipate it will improve  to the point she will not need DME (informed pt of this). Will follow to further assess and determine final d/c needs.    PT Recommendation/Assessment Patient needs continued PT services  PT Problem List Decreased strength;Decreased activity tolerance;Decreased balance;Decreased mobility;Decreased knowledge of use of DME;Impaired sensation;Pain  PT Therapy Diagnosis  Difficulty walking  PT Plan  PT Frequency Min 3X/week  PT Treatment/Interventions DME instruction;Gait training;Stair training;Functional mobility training;Therapeutic activities;Balance training;Patient/family education  PT Recommendation  Follow Up Recommendations No PT follow up  Equipment Recommended None recommended by PT  Individuals Consulted  Consulted and Agree with Results and Recommendations Patient  Acute Rehab PT Goals  PT Goal Formulation With patient  Time For Goal Achievement 12/02/11  Potential to Achieve Goals Good  Pt will go Sit to Stand Independently;without upper extremity assist  PT Goal: Sit to Stand - Progress Goal set today  Pt will go Stand to Sit Independently;without upper extremity assist  PT Goal: Stand to Sit - Progress Goal set today  Pt will Ambulate >150 feet;Independently  PT Goal: Ambulate - Progress Goal set today  Pt will Go Up / Down Stairs 3-5 stairs;with supervision;with rail(s);with least restrictive assistive device  PT Goal: Up/Down Stairs - Progress Goal set today  PT General Charges  $$ ACUTE PT VISIT 1 Procedure  PT Evaluation  $Initial PT Evaluation Tier I 1 Procedure  PT Treatments  $Gait Training 8-22 mins     11/30/2011 Veda Canning, PT Pager: [phone]

## 2011-12-08 NOTE — Discharge Summary (Signed)
Physician Discharge Summary  Patient ID: Jordan Ortega MRN: [id number] DOB/AGE: 55-06-1955 55 y.o.  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      CALCIUM CARBONATE PO   Take 1 tablet by mouth daily.      HYDROcodone-acetaminophen 5-325 MG per tablet   Commonly known as: NORCO/VICODIN   Take 1 tablet by mouth every 6 (six) hours as needed. For pain      multivitamin with minerals Tabs   Take 1 tablet by mouth daily.      omega-3 acid ethyl esters 1 G capsule   Commonly known as: LOVAZA   Take 2 g by mouth daily.      promethazine 12.5 MG tablet   Commonly known as: PHENERGAN   Take 1 tablet (12.5 mg total) by mouth every 4 (four) hours as needed for nausea.      propranolol 20 MG tablet   Commonly known as: INDERAL   Take 20 mg by mouth daily.      simvastatin 20 MG tablet   Commonly known as: ZOCOR   Take 20 mg by mouth at bedtime.      traMADol 50 MG tablet   Commonly known as: ULTRAM   Take 1 tablet (50 mg total) by mouth every 6 (six) hours as needed for pain.      UNABLE TO FIND   This note is to excuse Ms.Onnen from work 10/27 -11/01 due to medical illness/hospitalization      VITAMIN B6 PO   Take 1 tablet by mouth daily.      VITAMIN D PO   Take 1 tablet by mouth daily.         Consults:  Neurology Dr.Reynolds   Significant Diagnostic Studies:   MRI HEAD WITHOUT CONTRAST IMPRESSION: Negative MRI of the brain for age.   CT HEAD WITHOUT  CONTRAST IMPRESSION: Negative CT of the head.    Brief H and P:  56 yo female with h/o migraine headaches who has had a headache for the last 3 weeks persistently with sudden onset at 915p of left facial weakness and left arm/leg weakness and numbness. No fevers or rashes at home. Photophobia present. Pt states that she use to have 2-3 migraines weekly and was started on amitryptiline and inderal over a year ago and this is only her 3 migraine this year. However this migraine is much different than her previous migraines, she has never had any neurological deficits before. She has also associated n/v which is normal. Pt has neg cth and a stat mri has been ordered. She received decadron about an hour ago which has not helped. She normally recieves nubain and phenergan which helps. No h/o htn or hld or dm. She does have h/o bell's palsy in past which she had a full recovery from    Hospital Course:  Headache/L sided weakness:  Suspected to be secondary to Hemiplegic migraine,  functional component could also be contributing Seen and followed by neurology Her Left sided weakness completely resolved , headache has improved Neuro recommended Ultram PRN with Phenergan, She did not respond well to Depakote or Decadron, due to focal nature of symptoms she would need to refrain from Vasoconstrictors. Elavil was increased to 150mg  po QHS for prophylaxis, also continued on Propranolol. She is advised to FU with Neurology as outpatient for further titration    Time spent on Discharge:  Signed: Cheyenna Pankowski [facility]  12/08/2011, 9:28 PM

## 2013-02-12 ENCOUNTER — Other Ambulatory Visit: Payer: Self-pay

## 2013-02-13 ENCOUNTER — Ambulatory Visit: Payer: Self-pay | Admitting: Family Medicine

## 2013-06-29 ENCOUNTER — Ambulatory Visit: Payer: Self-pay | Admitting: Gastroenterology

## 2013-07-02 LAB — PATHOLOGY REPORT

## 2013-08-01 ENCOUNTER — Ambulatory Visit: Payer: Self-pay | Admitting: Family Medicine

## 2013-11-13 ENCOUNTER — Ambulatory Visit: Payer: Self-pay | Admitting: Family Medicine

## 2013-11-20 ENCOUNTER — Other Ambulatory Visit: Payer: Self-pay | Admitting: Licensed Clinical Social Worker

## 2013-12-07 ENCOUNTER — Emergency Department: Payer: Self-pay | Admitting: Emergency Medicine

## 2014-05-20 ENCOUNTER — Ambulatory Visit
Admit: 2014-05-20 | Disposition: A | Discharge status: Home / Self Care | Payer: Self-pay | Attending: Family Medicine | Admitting: Family Medicine

## 2014-07-10 DIAGNOSIS — G43009 Migraine without aura, not intractable, without status migrainosus: Secondary | ICD-10-CM | POA: Insufficient documentation

## 2014-07-10 DIAGNOSIS — R413 Other amnesia: Secondary | ICD-10-CM | POA: Insufficient documentation

## 2014-07-10 DIAGNOSIS — G43409 Hemiplegic migraine, not intractable, without status migrainosus: Secondary | ICD-10-CM | POA: Insufficient documentation

## 2014-07-10 DIAGNOSIS — R1013 Epigastric pain: Secondary | ICD-10-CM | POA: Insufficient documentation

## 2014-07-10 DIAGNOSIS — K5909 Other constipation: Secondary | ICD-10-CM | POA: Insufficient documentation

## 2014-07-10 DIAGNOSIS — G43109 Migraine with aura, not intractable, without status migrainosus: Secondary | ICD-10-CM | POA: Insufficient documentation

## 2014-07-10 DIAGNOSIS — R509 Fever, unspecified: Secondary | ICD-10-CM | POA: Insufficient documentation

## 2014-07-11 ENCOUNTER — Other Ambulatory Visit: Payer: Self-pay

## 2014-07-11 ENCOUNTER — Encounter: Payer: Self-pay | Admitting: Family Medicine

## 2014-07-11 ENCOUNTER — Ambulatory Visit (INDEPENDENT_AMBULATORY_CARE_PROVIDER_SITE_OTHER): Payer: BC Managed Care – PPO | Admitting: Family Medicine

## 2014-07-11 VITALS — BP 102/70 | HR 69 | Temp 98.2°F | Resp 16 | Wt 111.2 lb

## 2014-07-11 DIAGNOSIS — J40 Bronchitis, not specified as acute or chronic: Secondary | ICD-10-CM | POA: Diagnosis not present

## 2014-07-11 DIAGNOSIS — S70361A Insect bite (nonvenomous), right thigh, initial encounter: Secondary | ICD-10-CM

## 2014-07-11 DIAGNOSIS — L089 Local infection of the skin and subcutaneous tissue, unspecified: Secondary | ICD-10-CM

## 2014-07-11 DIAGNOSIS — W57XXXA Bitten or stung by nonvenomous insect and other nonvenomous arthropods, initial encounter: Secondary | ICD-10-CM

## 2014-07-11 MED ORDER — DOXYCYCLINE HYCLATE 100 MG PO TABS: 100.0000 mg | ORAL_TABLET | Freq: Two times a day (BID) | ORAL | Status: DC

## 2014-07-11 NOTE — Progress Notes (Signed)
Subjective:    Patient ID: Jordan Ortega, female    DOB: 1956/01/03, 59 y.o.   MRN: [id number]  HPI Tick Removal and Cough yesterday. Tick had been attached less than 24 hours. Has a picture on her phone of red streaks around tick bite site on the right lower inner thigh that has resolved today. Only scab at bite site remains. Cough non-productive with scratchy throat from post nasal drip and slight laryngitis. Denies fever, joint pains or other rashes.   Past Medical History  Diagnosis Date  . Migraines   . Bell palsy    Patient Active Problem List   Diagnosis Date Noted  . 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 Acute onset aura migraine 07/10/2014  . Bad memory 07/10/2014  . Migraine without aura 07/10/2014  . Abdominal pain, epigastric 07/10/2014  . Chills with fever 07/10/2014  . Chronic constipation 07/10/2014  . Migraines   . Migraine 11/28/2011  . Weakness of left side of body 11/28/2011  . ANXIETY 11/20/2009  . DEPRESSION 11/20/2009  . FOOT PAIN 11/20/2009  . Hypercholesterolemia without hypertriglyceridemia 06/18/2009  . 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 Malaise and fatigue 10/29/2008  . Abnormal loss of weight 10/29/2008  . 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 Affective bipolar disorder 05/17/2008  . Barrett esophagus 05/17/2008   History  Substance Use Topics  . Smoking status: Current Every Day Smoker -- 0.50 packs/day for 30 years    Types: Cigarettes  . Smokeless tobacco: Not on file  . Alcohol Use: 0.0 oz/week    0 Standard drinks or equivalent per week     Comment: occasional   Past Surgical History  Procedure Laterality Date  . Abdominal hysterectomy    . Cholecystectomy     Family History  Problem Relation Age of Onset  . Migraines Sister    Current Outpatient Prescriptions on File Prior to Visit  Medication Sig Dispense Refill  . HYDROcodone-acetaminophen  (NORCO/VICODIN) 5-325 MG per tablet Take 1 tablet by mouth every 6 (six) hours as needed. For pain    . simvastatin (ZOCOR) 20 MG tablet Take 20 mg by mouth at bedtime.     No current facility-administered medications on file prior to visit.   Allergies  Allergen Reactions  . Ciprofloxacin Other (See Comments)  . Codeine Hives and Itching    REACTION: Dizziness  . Diazepam Other (See Comments)    Patient states "it makes me crazy" REACTION: Behavioral problems  . Iodinated Diagnostic Agents Other (See Comments)  . Metoprolol   . Morphine Hives  . Niacin Other (See Comments)  . Niacin And Related Other (See Comments)    flushing  . Toradol  [Ketorolac Tromethamine]        Review of Systems  Constitutional: Positive for fatigue. Negative for fever and chills.  HENT: Positive for postnasal drip and sore throat. Negative for congestion, ear pain, rhinorrhea, sneezing and voice change.   Respiratory: Positive for cough and chest tightness. Negative for apnea and wheezing.   Cardiovascular: Negative.   Gastrointestinal: Positive for constipation.       Chronic constipation controlled by laxative occasionally and extra water intake.  Musculoskeletal: Negative.   Neurological: Negative for dizziness, weakness and numbness.       Fewer headaches since having ear piercings.  Hematological: Negative for adenopathy. Does not bruise/bleed easily.       Objective:   Physical Exam  Constitutional: She appears  well-developed. No distress.  HENT:  Head: Normocephalic.  Nose: Nose normal.  Mouth/Throat: Oropharynx is clear and moist.  TM's with good reflex. Redness and tenderness around new ear piercings top of auricle bilaterally.  Eyes: Conjunctivae and EOM are normal. Pupils are equal, round, and reactive to light.  Neck: Normal range of motion. Neck supple.  Cardiovascular: Normal rate, regular rhythm and normal heart sounds.   Pulmonary/Chest: Effort normal.  Coarse breath  sounds with recurrent cough. No wheeze or rales.  Abdominal: Soft. Bowel sounds are normal.  Musculoskeletal: Normal range of motion.  Neurological: She is alert. She has normal reflexes.  Skin: Skin is warm.  Some redness around 3 mm scab from tick removal yesterday.   BP 102/70 mmHg  Pulse 69  Temp(Src) 98.2 F (36.8 C) (Oral)  Resp 16  Wt 111 lb 3.2 oz (50.44 kg)  SpO2 99%        Assessment & Plan:  1. Bronchitis Recent onset but still smoking daily. No fever or sputum production of significance. May use Mucinex-DM and saltwater gargles for scratchy throat. Increase fluid intake and recheck if no better in a week. - doxycycline (VIBRA-TABS) 100 MG tablet; Take 1 tablet (100 mg total) by mouth 2 (two) times daily.  Dispense: 20 tablet; Refill: 0  2. Infected tick bite of thigh, right, initial encounter Onset yesterday without fever. Rash has faded away. Will treat with antibiotic and recheck if any fever develops. - doxycycline (VIBRA-TABS) 100 MG tablet; Take 1 tablet (100 mg total) by mouth 2 (two) times daily.  Dispense: 20 tablet; Refill: 0

## 2014-07-16 ENCOUNTER — Encounter: Payer: Self-pay | Admitting: Family Medicine

## 2014-07-16 ENCOUNTER — Ambulatory Visit (INDEPENDENT_AMBULATORY_CARE_PROVIDER_SITE_OTHER): Payer: BC Managed Care – PPO | Admitting: Family Medicine

## 2014-07-16 VITALS — BP 98/62 | HR 70 | Temp 97.7°F | Resp 16 | Wt 109.6 lb

## 2014-07-16 DIAGNOSIS — R05 Cough: Secondary | ICD-10-CM | POA: Diagnosis not present

## 2014-07-16 DIAGNOSIS — Z720 Tobacco use: Secondary | ICD-10-CM | POA: Diagnosis not present

## 2014-07-16 DIAGNOSIS — R059 Cough, unspecified: Secondary | ICD-10-CM

## 2014-07-16 MED ORDER — BENZONATATE 200 MG PO CAPS: 200.0000 mg | ORAL_CAPSULE | Freq: Two times a day (BID) | ORAL | Status: DC | PRN

## 2014-07-16 MED ORDER — HYDROCODONE-ACETAMINOPHEN 5-325 MG PO TABS: 1.0000 | ORAL_TABLET | Freq: Four times a day (QID) | ORAL | Status: DC | PRN

## 2014-07-16 NOTE — Progress Notes (Signed)
Subjective:    Patient ID: Jordan Ortega, female    DOB: 10-Oct-1955, 59 y.o.   MRN: [id number]  HPI Cough and some wheeze onset 10-12 days ago. Tried Mucinex and Vicodin to help control cough without much relief. No nasal congestion or post nasal drip. No fever. Still taking Doxycycline for tick bite with rash. No bodyaches or chills. Feel very tired and "out of breath".  Past Medical History  Diagnosis Date  . Migraines   . Bell palsy    Past Surgical History  Procedure Laterality Date  . Abdominal hysterectomy    . Cholecystectomy     History  Substance Use Topics  . Smoking status: Current Every Day Smoker -- 0.50 packs/day for 30 years    Types: Cigarettes  . Smokeless tobacco: Not on file  . Alcohol Use: 0.0 oz/week    0 Standard drinks or equivalent per week     Comment: occasional   Family History  Problem Relation Age of Onset  . Migraines Sister    Current Outpatient Prescriptions on File Prior to Visit  Medication Sig Dispense Refill  . amitriptyline (ELAVIL) 150 MG tablet Take by mouth.    Marland Kitchen HYDROcodone-acetaminophen (NORCO/VICODIN) 5-325 MG per tablet Take 1 tablet by mouth every 6 (six) hours as needed. For pain    . omeprazole (PRILOSEC) 10 MG capsule Take by mouth.    . promethazine (PHENERGAN) 25 MG tablet 1 tablet 4 (four) times daily as needed.  0  . simvastatin (ZOCOR) 20 MG tablet Take 20 mg by mouth at bedtime.     No current facility-administered medications on file prior to visit.   Allergies  Allergen Reactions  . Ciprofloxacin Other (See Comments)  . Codeine Hives and Itching    REACTION: Dizziness  . Diazepam Other (See Comments)    Patient states "it makes me crazy" REACTION: Behavioral problems  . Iodinated Diagnostic Agents Other (See Comments)  . Metoprolol   . Morphine Hives  . Niacin Other (See Comments)  . Niacin And Related Other (See Comments)    flushing  . Toradol  [Ketorolac Tromethamine]    Review of Systems   Constitutional: Positive for fatigue. Negative for fever and chills.  HENT: Negative for congestion, ear pain, rhinorrhea, sinus pressure, sneezing and sore throat.   Eyes: Negative.   Respiratory: Positive for cough and shortness of breath.   Cardiovascular: Negative.   Gastrointestinal: Negative for nausea, vomiting and diarrhea.  Musculoskeletal: Negative.        Objective:   Physical Exam  Constitutional: She is oriented to person, place, and time. She appears well-developed and well-nourished. No distress.  HENT:  Head: Normocephalic and atraumatic.  Right Ear: Hearing and external ear normal.  Left Ear: Hearing and external ear normal.  Nose: Nose normal.  Mouth/Throat: Oropharynx is clear and moist.  Eyes: Conjunctivae, EOM and lids are normal. Pupils are equal, round, and reactive to light. Right eye exhibits no discharge. Left eye exhibits no discharge. No scleral icterus.  Neck: Normal range of motion. Neck supple.  Cardiovascular: Normal rate and regular rhythm.   Pulmonary/Chest: Effort normal. No respiratory distress.  Abdominal: Soft. Bowel sounds are normal.  Musculoskeletal: Normal range of motion.  Neurological: She is alert and oriented to person, place, and time.  Skin: Skin is intact. No lesion and no rash noted.  Psychiatric: She has a normal mood and affect. Her speech is normal and behavior is normal. Thought content normal.   BP 98/62  mmHg  Pulse 70  Temp(Src) 97.7 F (36.5 C) (Oral)  Resp 16  Wt 109 lb 9.6 oz (49.714 kg)  SpO2 100%     Assessment & Plan:  1. Cough  Onset over the past 10 days. No nasal congestion or fever. Just a ticklish cough. Will refill Hydrocodone/APAP tablet and add Benzonatate. May continue Mucinex and drink extra fluids. Recheck prn.  - benzonatate (TESSALON) 200 MG capsule; Take 1 capsule (200 mg total) by mouth 2 (two) times daily as needed for cough.  Dispense: 20 capsule; Refill: 0 - HYDROcodone-acetaminophen  (NORCO/VICODIN) 5-325 MG per tablet; Take 1 tablet by mouth every 6 (six) hours as needed. For pain  Dispense: 30 tablet; Refill: 0  2. Tobacco user Still smoking daily. Encouraged to consider smoking cessation program.

## 2014-07-30 ENCOUNTER — Other Ambulatory Visit: Payer: Self-pay | Admitting: Family Medicine

## 2014-08-27 ENCOUNTER — Other Ambulatory Visit: Payer: Self-pay | Admitting: Family Medicine

## 2014-11-01 ENCOUNTER — Other Ambulatory Visit: Payer: Self-pay | Admitting: Family Medicine

## 2015-01-06 ENCOUNTER — Ambulatory Visit (INDEPENDENT_AMBULATORY_CARE_PROVIDER_SITE_OTHER): Payer: BC Managed Care – PPO | Admitting: Family Medicine

## 2015-01-06 ENCOUNTER — Encounter: Payer: Self-pay | Admitting: Family Medicine

## 2015-01-06 VITALS — BP 104/76 | HR 81 | Temp 98.4°F | Resp 14 | Wt 114.4 lb

## 2015-01-06 DIAGNOSIS — J029 Acute pharyngitis, unspecified: Secondary | ICD-10-CM | POA: Diagnosis not present

## 2015-01-06 DIAGNOSIS — Z8669 Personal history of other diseases of the nervous system and sense organs: Secondary | ICD-10-CM

## 2015-01-06 DIAGNOSIS — J069 Acute upper respiratory infection, unspecified: Secondary | ICD-10-CM

## 2015-01-06 MED ORDER — AMOXICILLIN 875 MG PO TABS: 875.0000 mg | ORAL_TABLET | Freq: Two times a day (BID) | ORAL | Status: DC

## 2015-01-06 MED ORDER — HYDROCODONE-HOMATROPINE 5-1.5 MG/5ML PO SYRP: 5.0000 mL | ORAL_SOLUTION | Freq: Three times a day (TID) | ORAL | Status: DC | PRN

## 2015-01-06 MED ORDER — PROMETHAZINE HCL 25 MG PO TABS: 25.0000 mg | ORAL_TABLET | Freq: Four times a day (QID) | ORAL | Status: DC | PRN

## 2015-01-06 NOTE — Progress Notes (Signed)
Patient ID: Jordan Ortega, female   DOB: 07/31/1955, 59 y.o.   MRN: [id number] Name: Jordan Ortega   MRN: [id number]    DOB: 29-Nov-1955   Date:01/06/2015       Progress Note  Subjective  Chief Complaint  Chief Complaint  Patient presents with  . URI    URI  This is a new problem. Episode onset: 3 days ago. The problem has been gradually worsening. Maximum temperature: some chills and sweats but did not take temperature. Associated symptoms include chest pain, congestion, coughing, ear pain, sneezing and a sore throat. Associated symptoms comments: Ears itch.. She has tried antihistamine and acetaminophen (Chloroseptic spray and Robitussin-DM not much help with sore throat or cough.) for the symptoms. The treatment provided no relief.    Past Medical History  Diagnosis Date  . Migraines   . Bell palsy     Social History  Substance Use Topics  . Smoking status: Current Every Day Smoker -- 0.50 packs/day for 30 years    Types: Cigarettes  . Smokeless tobacco: Not on file  . Alcohol Use: 0.0 oz/week    0 Standard drinks or equivalent per week     Comment: occasional     Current outpatient prescriptions:  .  amitriptyline (ELAVIL) 150 MG tablet, TAKE ONE TABLET BY MOUTH NIGHTLY AT BEDTIME, Disp: 30 tablet, Rfl: 4 .  HYDROcodone-acetaminophen (NORCO/VICODIN) 5-325 MG per tablet, Take 1 tablet by mouth every 6 (six) hours as needed. For pain, Disp: 30 tablet, Rfl: 0 .  omeprazole (PRILOSEC) 20 MG capsule, TAKE ONE CAPSULE BY MOUTH ONE TIME DAILY, Disp: 60 capsule, Rfl: 1 .  promethazine (PHENERGAN) 25 MG tablet, 1 tablet 4 (four) times daily as needed., Disp: , Rfl: 0 .  simvastatin (ZOCOR) 40 MG tablet, TAKE ONE TABLET BY MOUTH NIGHTLY AT BEDTIME, Disp: 30 tablet, Rfl: 6  Allergies  Allergen Reactions  . Ciprofloxacin Other (See Comments)  . Codeine Hives and Itching    REACTION: Dizziness  . Diazepam Other (See Comments)    Patient states "it makes me crazy" REACTION:  Behavioral problems  . Iodinated Diagnostic Agents Other (See Comments)  . Metoprolol   . Morphine Hives  . Niacin Other (See Comments)  . Niacin And Related Other (See Comments)    flushing  . Toradol  [Ketorolac Tromethamine]     Review of Systems  Constitutional: Negative.   HENT: Positive for congestion, ear pain, sneezing and sore throat.   Eyes: Negative.   Respiratory: Positive for cough.   Cardiovascular: Positive for chest pain.  Gastrointestinal: Negative.   Genitourinary: Negative.   Musculoskeletal: Negative.   Skin: Negative.   Neurological: Negative.   Endo/Heme/Allergies: Negative.   Psychiatric/Behavioral: Negative.     Objective  Filed Vitals:   01/06/15 1357  BP: 104/76  Pulse: 81  Temp: 98.4 F (36.9 C)  TempSrc: Oral  Resp: 14  Weight: 114 lb 6.4 oz (51.891 kg)  SpO2: 96%    Physical Exam  Constitutional: She is oriented to person, place, and time and well-developed, well-nourished, and in no distress.  HENT:  Head: Normocephalic.  Right Ear: External ear normal.  Left Ear: External ear normal.  Nose: Nose normal.  Mouth/Throat: Oropharynx is clear and moist.  Injected posterior pharynx without exudates. Nasal mucus membranes are slightly reddened. Good transillumination of all sinuses.  Eyes: Conjunctivae and EOM are normal.  Neck: Normal range of motion. Neck supple.  Cardiovascular: Normal rate and regular rhythm.   Pulmonary/Chest:  Effort normal and breath sounds normal.  Abdominal: Soft. Bowel sounds are normal.  Musculoskeletal: Normal range of motion.  Neurological: She is alert and oriented to person, place, and time.  Psychiatric: Affect and judgment normal.   Assessment & Plan  1. Sore throat Onset 3 days ago without exudates. May use Chloroseptic spray or lozenge to help with scratchy throat. Recheck prn.  2. Upper respiratory infection Cough with little sputum production and disrupting sleep (her and her husband). May use  expectorant and will give Hycodan for cough suppression. If fever recurs or purulent sputum starts, may start the Amoxicillin. Increase fluid intake and may use Tylenol prn. Recheck prn. - HYDROcodone-homatropine (HYCODAN) 5-1.5 MG/5ML syrup; Take 5 mLs by mouth every 8 (eight) hours as needed for cough.  Dispense: 120 mL; Refill: 0 - amoxicillin (AMOXIL) 875 MG tablet; Take 1 tablet (875 mg total) by mouth 2 (two) times daily.  Dispense: 20 tablet; Refill: 0  3. Hx of migraines No recent headaches. Usually will recur if the weather changes (rain forecast for tomorrow). Refill Phenergan and recheck prn. - promethazine (PHENERGAN) 25 MG tablet; Take 1 tablet (25 mg total) by mouth 4 (four) times daily as needed.  Dispense: 30 tablet; Refill: 0

## 2015-02-06 ENCOUNTER — Other Ambulatory Visit: Payer: Self-pay | Admitting: Family Medicine

## 2015-03-09 ENCOUNTER — Other Ambulatory Visit: Payer: Self-pay | Admitting: Family Medicine

## 2015-05-01 ENCOUNTER — Other Ambulatory Visit: Payer: Self-pay | Admitting: Family Medicine

## 2015-06-12 ENCOUNTER — Ambulatory Visit (INDEPENDENT_AMBULATORY_CARE_PROVIDER_SITE_OTHER): Payer: BC Managed Care – PPO | Admitting: Physician Assistant

## 2015-06-12 ENCOUNTER — Encounter: Payer: Self-pay | Admitting: Physician Assistant

## 2015-06-12 VITALS — BP 114/82 | HR 64 | Temp 99.2°F | Resp 14 | Wt 110.8 lb

## 2015-06-12 DIAGNOSIS — K2270 Barrett's esophagus without dysplasia: Secondary | ICD-10-CM | POA: Diagnosis not present

## 2015-06-12 DIAGNOSIS — H66001 Acute suppurative otitis media without spontaneous rupture of ear drum, right ear: Secondary | ICD-10-CM

## 2015-06-12 DIAGNOSIS — G43009 Migraine without aura, not intractable, without status migrainosus: Secondary | ICD-10-CM | POA: Diagnosis not present

## 2015-06-12 DIAGNOSIS — R29898 Other symptoms and signs involving the musculoskeletal system: Secondary | ICD-10-CM | POA: Diagnosis not present

## 2015-06-12 DIAGNOSIS — J069 Acute upper respiratory infection, unspecified: Secondary | ICD-10-CM

## 2015-06-12 MED ORDER — HYDROCODONE-ACETAMINOPHEN 5-325 MG PO TABS: 1.0000 | ORAL_TABLET | Freq: Four times a day (QID) | ORAL | Status: DC | PRN

## 2015-06-12 MED ORDER — PANTOPRAZOLE SODIUM 40 MG PO TBEC: 40.0000 mg | DELAYED_RELEASE_TABLET | Freq: Every day | ORAL | Status: DC

## 2015-06-12 MED ORDER — AMOXICILLIN 875 MG PO TABS: 875.0000 mg | ORAL_TABLET | Freq: Two times a day (BID) | ORAL | Status: DC

## 2015-06-12 NOTE — Patient Instructions (Signed)

## 2015-06-12 NOTE — Progress Notes (Addendum)
Patient ID: Jordan Ortega, female   DOB: 04/01/1955, 60 y.o.   MRN: [id number]   [name] Female    DOB: 07/12/1955   60 y.o.   MRN: [id number] Visit Date: 06/12/2015  Today's Provider: Mar Daring, PA-C   Chief Complaint  Patient presents with  . Ear Pain   Subjective:    Otalgia  There is pain in the right ear. This is a new problem. The current episode started yesterday (had similar symptoms 2 weeks ago with ear pain only but subsided). The problem occurs constantly. The problem has been unchanged. Associated symptoms include ear discharge and headaches (occasional migraine). Pertinent negatives include no neck pain. She has tried nothing for the symptoms. The treatment provided no relief. There is no history of a chronic ear infection, hearing loss or a tympanostomy tube.   She also would like to discuss bilateral upper extremity weakness that only occurs when she is lifting something. Can be light or heavy. If she lifts above shoulder height her arms and hands become weak and she will just drop things before she even realizes it. Denies any numbness, tingling or pain. She has discussed this before with Vernie Murders, PA-C, her PCP, and he felt it may have just been aging changes. She agreed until it continued to progress and worsen over the last few weeks to month.   She also would like a refill of her Norco 5-325mg  that she uses for migraines.     Previous Medications   AMITRIPTYLINE (ELAVIL) 150 MG TABLET    TAKE ONE TABLET BY MOUTH NIGHTLY AT BEDTIME   AMOXICILLIN (AMOXIL) 875 MG TABLET    Take 1 tablet (875 mg total) by mouth 2 (two) times daily.   HYDROCODONE-ACETAMINOPHEN (NORCO/VICODIN) 5-325 MG PER TABLET    Take 1 tablet by mouth every 6 (six) hours as needed. For pain   HYDROCODONE-HOMATROPINE (HYCODAN) 5-1.5 MG/5ML SYRUP    Take 5 mLs by mouth every 8 (eight) hours as needed for cough.   OMEPRAZOLE (PRILOSEC) 20 MG CAPSULE    TAKE ONE CAPSULE BY  MOUTH ONE TIME DAILY   PROMETHAZINE (PHENERGAN) 25 MG TABLET    Take 1 tablet (25 mg total) by mouth 4 (four) times daily as needed.   SIMVASTATIN (ZOCOR) 40 MG TABLET    TAKE ONE TABLET BY MOUTH NIGHTLY AT BEDTIME   Allergies  Allergen Reactions  . Ciprofloxacin Other (See Comments)  . Codeine Hives and Itching    REACTION: Dizziness  . Diazepam Other (See Comments)    Patient states "it makes me crazy" REACTION: Behavioral problems  . Iodinated Diagnostic Agents Other (See Comments)  . Metoprolol   . Morphine Hives  . Niacin Other (See Comments)  . Niacin And Related Other (See Comments)    flushing  . Toradol  [Ketorolac Tromethamine]     Review of Systems  HENT: Positive for ear discharge and ear pain.   Eyes: Negative.   Respiratory: Negative.   Cardiovascular: Negative.   Gastrointestinal: Negative.   Endocrine: Negative.   Genitourinary: Negative.   Musculoskeletal: Negative for myalgias, back pain, joint swelling, arthralgias, gait problem, neck pain and neck stiffness.  Skin: Negative.   Allergic/Immunologic: Negative.   Neurological: Positive for weakness (upper extremity) and headaches (occasional migraine). Negative for dizziness, light-headedness and numbness.  Hematological: Negative.   Psychiatric/Behavioral: Negative.     Social History  Substance Use Topics  . Smoking status: Current Every Day Smoker -- 0.50 packs/day  for 30 years    Types: Cigarettes  . Smokeless tobacco: Not on file  . Alcohol Use: 0.0 oz/week    0 Standard drinks or equivalent per week     Comment: occasional   Objective:   BP 114/82 mmHg  Pulse 64  Temp(Src) 99.2 F (37.3 C) (Oral)  Resp 14  Wt 110 lb 12.8 oz (50.259 kg)  Physical Exam  Constitutional: She appears well-developed and well-nourished. No distress.  HENT:  Head: Normocephalic and atraumatic.  Right Ear: Hearing, external ear and ear canal normal. Tympanic membrane is erythematous and bulging. Tympanic  membrane is not perforated. A middle ear effusion is present.  Left Ear: Hearing, tympanic membrane, external ear and ear canal normal.  Nose: Mucosal edema present. No rhinorrhea. Right sinus exhibits no maxillary sinus tenderness and no frontal sinus tenderness. Left sinus exhibits no maxillary sinus tenderness and no frontal sinus tenderness.  Mouth/Throat: Uvula is midline, oropharynx is clear and moist and mucous membranes are normal. No oropharyngeal exudate, posterior oropharyngeal edema or posterior oropharyngeal erythema.  Eyes: Conjunctivae are normal. Pupils are equal, round, and reactive to light. Right eye exhibits no discharge. Left eye exhibits no discharge. No scleral icterus.  Neck: Normal range of motion. Neck supple. No tracheal deviation present. No thyromegaly present.  Cardiovascular: Normal rate, regular rhythm and normal heart sounds.  Exam reveals no gallop and no friction rub.   No murmur heard. Pulmonary/Chest: Effort normal and breath sounds normal. No stridor. No respiratory distress. She has no wheezes. She has no rales.  Musculoskeletal: Normal range of motion. She exhibits no edema or tenderness.  Grip strength equal  Lymphadenopathy:    She has no cervical adenopathy.  Skin: Skin is warm and dry. She is not diaphoretic.  Vitals reviewed.       Assessment & Plan:     1. Acute suppurative otitis media of right ear without spontaneous rupture of tympanic membrane, recurrence not specified Worsening symptoms that has not responded to OTC medications. Will treat with amoxil as below. Advised to use tylenol for fevers and bosy aches. Stay well hydrated and try to get rest. Call if no improvement in symptoms.  - amoxicillin (AMOXIL) 875 MG tablet; Take 1 tablet (875 mg total) by mouth 2 (two) times daily.  Dispense: 14 tablet; Refill: 0  2. Migraine without aura and without status migrainosus, not intractable Stable. Diagnosis pulled for medication refill. Continue  current medical treatment plan. - HYDROcodone-acetaminophen (NORCO/VICODIN) 5-325 MG tablet; Take 1 tablet by mouth every 6 (six) hours as needed. For pain  Dispense: 30 tablet; Refill: 0  3. Upper extremity weakness Discussed with Vernie Murders, PA-C. Will get labs for inflammatory markers and autoimmune causes. If negative will get nerve conduction study and then follow up pending those results.  *Addend:  Slight increase in BUN and CK on labs. Will refer to Neuro for further evaluation and consideration for NCS. 4. Barrett esophagus Stable. Diagnosis pulled for medication refill. Continue current medical treatment plan. - pantoprazole (PROTONIX) 40 MG tablet; Take 1 tablet (40 mg total) by mouth daily.  Dispense: 30 tablet; Refill: 3,ed  Follow up: No Follow-up on file.

## 2015-06-14 LAB — CBC WITH DIFFERENTIAL/PLATELET
Basophils Absolute: 0 10*3/uL (ref 0.0–0.2)
Basos: 1 %
EOS (ABSOLUTE): 0.2 10*3/uL (ref 0.0–0.4)
EOS: 3 %
HEMATOCRIT: 36.2 % (ref 34.0–46.6)
HEMOGLOBIN: 12.5 g/dL (ref 11.1–15.9)
Immature Grans (Abs): 0 10*3/uL (ref 0.0–0.1)
Immature Granulocytes: 0 %
LYMPHS ABS: 2.5 10*3/uL (ref 0.7–3.1)
Lymphs: 35 %
MCH: 32 pg (ref 26.6–33.0)
MCHC: 34.5 g/dL (ref 31.5–35.7)
MCV: 93 fL (ref 79–97)
MONOCYTES: 7 %
MONOS ABS: 0.5 10*3/uL (ref 0.1–0.9)
NEUTROS ABS: 4 10*3/uL (ref 1.4–7.0)
Neutrophils: 54 %
PLATELETS: 244 10*3/uL (ref 150–379)
RBC: 3.91 x10E6/uL (ref 3.77–5.28)
RDW: 13.4 % (ref 12.3–15.4)
WBC: 7.3 10*3/uL (ref 3.4–10.8)

## 2015-06-14 LAB — C-REACTIVE PROTEIN: CRP: 0.9 mg/L (ref 0.0–4.9)

## 2015-06-14 LAB — BASIC METABOLIC PANEL
BUN/Creatinine Ratio: 33 — ABNORMAL HIGH (ref 9–23)
BUN: 29 mg/dL — ABNORMAL HIGH (ref 6–24)
CALCIUM: 9.2 mg/dL (ref 8.7–10.2)
CHLORIDE: 102 mmol/L (ref 96–106)
CO2: 23 mmol/L (ref 18–29)
Creatinine, Ser: 0.89 mg/dL (ref 0.57–1.00)
GFR, EST AFRICAN AMERICAN: 83 mL/min/{1.73_m2} (ref >59)
GFR, EST NON AFRICAN AMERICAN: 72 mL/min/{1.73_m2} (ref >59)
Glucose: 71 mg/dL (ref 65–99)
POTASSIUM: 4.6 mmol/L (ref 3.5–5.2)
SODIUM: 140 mmol/L (ref 134–144)

## 2015-06-14 LAB — ANA W/REFLEX IF POSITIVE: Anti Nuclear Antibody(ANA): NEGATIVE

## 2015-06-14 LAB — CK: Total CK: 180 U/L — ABNORMAL HIGH (ref 24–173)

## 2015-06-14 LAB — SEDIMENTATION RATE: Sed Rate: 2 mm/hr (ref 0–40)

## 2015-06-14 LAB — RHEUMATOID FACTOR: Rhuematoid fact SerPl-aCnc: <10 IU/mL (ref 0.0–13.9)

## 2015-06-16 ENCOUNTER — Telehealth: Payer: Self-pay | Admitting: Physician Assistant

## 2015-06-16 ENCOUNTER — Telehealth: Payer: Self-pay

## 2015-06-16 NOTE — Telephone Encounter (Signed)
Advised patient as below. Patient reports that she would like to hold off on neurology referral. She reports that symptoms have improved slightly. She will call back if symptoms continue or worsen.

## 2015-06-16 NOTE — Telephone Encounter (Signed)
FYI--Pt refused neurology referral.Did not want appointment set up

## 2015-06-16 NOTE — Telephone Encounter (Signed)
Ok thank you. Sorry for the inconvenience.

## 2015-06-16 NOTE — Telephone Encounter (Signed)
-----   Message from Mar Daring, [place] sent at 06/16/2015  1:24 PM EDT ----- Labs were fairly normal. Discussed with Ivar Bury, PA-C and will refer to neuro for further evaluation and consideration for nerve conduction study.

## 2015-06-16 NOTE — Addendum Note (Signed)
Addended by: Mar Daring on: 06/16/2015 01:26 PM   Modules accepted: Orders

## 2015-06-26 ENCOUNTER — Other Ambulatory Visit: Payer: Self-pay | Admitting: Family Medicine

## 2015-07-02 ENCOUNTER — Encounter: Payer: Self-pay | Admitting: Physician Assistant

## 2015-07-02 ENCOUNTER — Ambulatory Visit (INDEPENDENT_AMBULATORY_CARE_PROVIDER_SITE_OTHER): Payer: BC Managed Care – PPO | Admitting: Physician Assistant

## 2015-07-02 VITALS — BP 102/64 | HR 84 | Temp 98.7°F | Resp 16 | Wt 112.0 lb

## 2015-07-02 DIAGNOSIS — F3175 Bipolar disorder, in partial remission, most recent episode depressed: Secondary | ICD-10-CM

## 2015-07-02 DIAGNOSIS — F419 Anxiety disorder, unspecified: Secondary | ICD-10-CM

## 2015-07-02 DIAGNOSIS — H6983 Other specified disorders of Eustachian tube, bilateral: Secondary | ICD-10-CM | POA: Diagnosis not present

## 2015-07-02 MED ORDER — FLUTICASONE PROPIONATE 50 MCG/ACT NA SUSP: 2.0000 | Freq: Every day | NASAL | Status: DC

## 2015-07-02 MED ORDER — ALPRAZOLAM 0.5 MG PO TABS: 0.5000 mg | ORAL_TABLET | Freq: Three times a day (TID) | ORAL | Status: DC | PRN

## 2015-07-02 NOTE — Patient Instructions (Signed)

## 2015-07-02 NOTE — Progress Notes (Signed)
Patient ID: Jordan Ortega, female   DOB: 1955-09-02, 60 y.o.   MRN: [id number]       [name] Female    DOB: October 10, 1955   60 y.o.   MRN: [id number] Visit Date: 07/02/2015  Today's Provider: Mar Daring, PA-C   Chief Complaint  Patient presents with  . Ear Pain    X 2 days.    Subjective:    HPI Patient comes in today c/o ear pain. She reports that both ears bother her, but more in the left. She also mentions that she feels a burning sensation in her ears. Patient was seen in the office on 06/12/2015 with similar symptoms and had otitis media of the right ear. Patient was treated with Amoxil 875mg  which she tolerated well. Symptoms completely resolved until last night when the ear fullness and drainage returned.   She also complains of increasing anxiety due to stress at work. She states that there is a coworker that is making work more stressful and difficult for her. She mentions that her irritability and anger have also increased due to stress. She feels that if she doesn't try to get it under control she may get fired.    Allergies  Allergen Reactions  . Ciprofloxacin Other (See Comments)  . Codeine Hives and Itching    REACTION: Dizziness  . Diazepam Other (See Comments)    Patient states "it makes me crazy" REACTION: Behavioral problems  . Iodinated Diagnostic Agents Other (See Comments)  . Metoprolol   . Morphine Hives  . Niacin Other (See Comments)  . Niacin And Related Other (See Comments)    flushing  . Toradol  [Ketorolac Tromethamine]    Previous Medications   AMITRIPTYLINE (ELAVIL) 150 MG TABLET    TAKE ONE TABLET BY MOUTH NIGHTLY AT BEDTIME   HYDROCODONE-ACETAMINOPHEN (NORCO/VICODIN) 5-325 MG TABLET    Take 1 tablet by mouth every 6 (six) hours as needed. For pain   PANTOPRAZOLE (PROTONIX) 40 MG TABLET    Take 1 tablet (40 mg total) by mouth daily.   PROMETHAZINE (PHENERGAN) 25 MG TABLET    Take 1 tablet (25 mg total) by mouth 4  (four) times daily as needed.   SIMVASTATIN (ZOCOR) 40 MG TABLET    TAKE ONE TABLET BY MOUTH NIGHTLY AT BEDTIME    Review of Systems  Constitutional: Negative.  Negative for fever, chills, diaphoresis, appetite change and fatigue.  HENT: Positive for ear discharge and ear pain. Negative for rhinorrhea, sinus pressure, sore throat and tinnitus.   Respiratory: Negative.   Cardiovascular: Negative.   Gastrointestinal: Negative.   Neurological: Negative for dizziness, light-headedness and headaches.  Psychiatric/Behavioral: Positive for agitation. The patient is nervous/anxious.     Social History  Substance Use Topics  . Smoking status: Current Every Day Smoker -- 0.50 packs/day for 30 years    Types: Cigarettes  . Smokeless tobacco: Not on file  . Alcohol Use: 0.0 oz/week    0 Standard drinks or equivalent per week     Comment: occasional   Objective:   BP 102/64 mmHg  Pulse 84  Temp(Src) 98.7 F (37.1 C)  Resp 16  Wt 112 lb (50.803 kg)  SpO2 97%  Physical Exam  Constitutional: She appears well-developed and well-nourished. No distress.  HENT:  Head: Normocephalic and atraumatic.  Right Ear: Hearing, external ear and ear canal normal. Tympanic membrane is not erythematous and not bulging. A middle ear effusion is present.  Left Ear: Hearing, external  ear and ear canal normal. Tympanic membrane is not erythematous and not bulging. A middle ear effusion is present.  Nose: Nose normal. Right sinus exhibits no maxillary sinus tenderness and no frontal sinus tenderness. Left sinus exhibits no maxillary sinus tenderness and no frontal sinus tenderness.  Mouth/Throat: Uvula is midline, oropharynx is clear and moist and mucous membranes are normal. No oropharyngeal exudate, posterior oropharyngeal edema or posterior oropharyngeal erythema.  Eyes: Conjunctivae are normal. Pupils are equal, round, and reactive to light. Right eye exhibits no discharge. Left eye exhibits no discharge. No  scleral icterus.  Neck: Normal range of motion. Neck supple. No tracheal deviation present. No thyromegaly present.  Cardiovascular: Normal rate, regular rhythm and normal heart sounds.  Exam reveals no gallop and no friction rub.   No murmur heard. Pulmonary/Chest: Effort normal and breath sounds normal. No stridor. No respiratory distress. She has no wheezes. She has no rales.  Lymphadenopathy:    She has no cervical adenopathy.  Skin: Skin is warm and dry. She is not diaphoretic.  Psychiatric: Her behavior is normal. Judgment and thought content normal. She exhibits a depressed mood.  Vitals reviewed.       Assessment & Plan:     1. ETD (eustachian tube dysfunction), bilateral Advised her to start using Flonase as below. Also advised for her to use Sudafed 10 mg every 4 hours. I told her to try this for approximately 2 weeks and to call if symptoms do not improve. If the symptoms do not improve we may consider ENT referral - fluticasone (FLONASE) 50 MCG/ACT nasal spray; Place 2 sprays into both nostrils daily.  Dispense: 16 g; Refill: 6  2. Bipolar disorder, in partial remission, most recent episode depressed ([place]) She was previously diagnosed as bipolar many years ago but has been in remission for a while. She had been previously controlled with Prozac and Xanax. She feels that she would like to start with just the Xanax initially for her stress and anxiety that is due to her work. I did advise her that if she realizes she is taking more than the prescribed 3 Xanax a day as needed for any anxiety, that she needs to call the office and come back to be seen so that we may start her on an antidepressant medication as well. She voiced understanding and agrees. - ALPRAZolam (XANAX) 0.5 MG tablet; Take 1 tablet (0.5 mg total) by mouth 3 (three) times daily as needed for anxiety.  Dispense: 90 tablet; Refill: 0  3. Acute anxiety See above medical treatment plan. - ALPRAZolam (XANAX) 0.5 MG  tablet; Take 1 tablet (0.5 mg total) by mouth 3 (three) times daily as needed for anxiety.  Dispense: 90 tablet; Refill: 0       Mar Daring, PA-C  [facility]

## 2015-07-28 ENCOUNTER — Other Ambulatory Visit: Payer: Self-pay | Admitting: Family Medicine

## 2015-08-18 ENCOUNTER — Ambulatory Visit (INDEPENDENT_AMBULATORY_CARE_PROVIDER_SITE_OTHER): Payer: 59 | Admitting: Family Medicine

## 2015-08-18 ENCOUNTER — Encounter: Payer: Self-pay | Admitting: Family Medicine

## 2015-08-18 VITALS — BP 104/60 | HR 80 | Temp 97.9°F | Resp 16 | Ht 60.0 in | Wt 115.0 lb

## 2015-08-18 DIAGNOSIS — G43009 Migraine without aura, not intractable, without status migrainosus: Secondary | ICD-10-CM

## 2015-08-18 DIAGNOSIS — H6504 Acute serous otitis media, recurrent, right ear: Secondary | ICD-10-CM | POA: Diagnosis not present

## 2015-08-18 MED ORDER — NEOMYCIN-POLYMYXIN-HC 3.5-10000-1 OT SOLN: 3.0000 [drp] | Freq: Four times a day (QID) | OTIC | Status: DC

## 2015-08-18 MED ORDER — HYDROCODONE-ACETAMINOPHEN 5-325 MG PO TABS: 1.0000 | ORAL_TABLET | Freq: Four times a day (QID) | ORAL | Status: DC | PRN

## 2015-08-18 MED ORDER — AZITHROMYCIN 250 MG PO TABS: ORAL_TABLET | ORAL | Status: DC

## 2015-08-18 NOTE — Progress Notes (Signed)
[name] Female    DOB: [birth year]   60 y.o.   MRN: [id number] Visit Date: 08/18/2015  Today's Provider: Vernie Murders, PA   Chief Complaint  Patient presents with  . Ear Pain    2 days.    Subjective:    Ear Drainage  There is pain in both (more in the right ear) ears. This is a recurrent problem. The current episode started in the past 7 days. The problem has been unchanged. There has been no fever. The patient is experiencing no pain. Associated symptoms include ear discharge and headaches. Associated symptoms comments: Postnasal drip. She has tried nothing for the symptoms.  Patient reports that she has been treated for this issue before. She reports that she uses Flonase on a daily basis, and it helps a little. Patient denies any pain. She has not been using anything OTC for symptoms.   Past Medical History  Diagnosis Date  . Migraines   . Bell palsy    Past Surgical History  Procedure Laterality Date  . Abdominal hysterectomy    . Cholecystectomy     Family History  Problem Relation Age of Onset  . Migraines Sister    Allergies  Allergen Reactions  . Ciprofloxacin Other (See Comments)  . Codeine Hives and Itching    REACTION: Dizziness  . Diazepam Other (See Comments)    Patient states "it makes me crazy" REACTION: Behavioral problems  . Iodinated Diagnostic Agents Other (See Comments)  . Metoprolol   . Morphine Hives  . Niacin Other (See Comments)  . Niacin And Related Other (See Comments)    flushing  . Toradol  [Ketorolac Tromethamine]    Current Meds  Medication Sig  . ALPRAZolam (XANAX) 0.5 MG tablet Take 1 tablet (0.5 mg total) by mouth 3 (three) times daily as needed for anxiety.  Marland Kitchen amitriptyline (ELAVIL) 150 MG tablet TAKE ONE TABLET BY MOUTH NIGHTLY AT BEDTIME  . fluticasone (FLONASE) 50 MCG/ACT nasal spray Place 2 sprays into both nostrils daily.  Marland Kitchen HYDROcodone-acetaminophen (NORCO/VICODIN) 5-325 MG tablet Take 1 tablet  by mouth every 6 (six) hours as needed. For pain  . pantoprazole (PROTONIX) 40 MG tablet Take 1 tablet (40 mg total) by mouth daily.  . promethazine (PHENERGAN) 25 MG tablet Take 1 tablet (25 mg total) by mouth 4 (four) times daily as needed.  . simvastatin (ZOCOR) 40 MG tablet TAKE ONE TABLET BY MOUTH NIGHTLY AT BEDTIME    Review of Systems  HENT: Positive for ear discharge.   Neurological: Positive for headaches.    Social History  Substance Use Topics  . Smoking status: Current Every Day Smoker -- 0.50 packs/day for 30 years    Types: Cigarettes  . Smokeless tobacco: Not on file  . Alcohol Use: 0.0 oz/week    0 Standard drinks or equivalent per week     Comment: occasional   Objective:   BP 104/60 mmHg  Pulse 80  Temp(Src) 97.9 F (36.6 C)  Resp 16  Ht 5' (1.524 m)  Wt 115 lb (52.164 kg)  BMI 22.46 kg/m2  Physical Exam  Constitutional: She is oriented to person, place, and time. She appears well-developed and well-nourished. No distress.  HENT:  Head: Normocephalic and atraumatic.  Right Ear: Hearing normal.  Left Ear: Hearing and external ear normal.  Nose: Nose normal.  Mouth/Throat: Oropharynx is clear and moist.  Milky effusion behind right TM. No redness or purulent discharge. No redness  of ear canal but slightly tender to move tragus and auricle.  Eyes: Conjunctivae and lids are normal. Right eye exhibits no discharge. Left eye exhibits no discharge. No scleral icterus.  Neck: Neck supple.  Cardiovascular: Normal rate and regular rhythm.   Pulmonary/Chest: Effort normal. No respiratory distress.  Musculoskeletal: Normal range of motion.  Neurological: She is alert and oriented to person, place, and time.  Skin: Skin is intact. No lesion and no rash noted.  Psychiatric: She has a normal mood and affect. Her speech is normal and behavior is normal. Thought content normal.      Assessment & Plan:     1. Recurrent serous otitis media of right ear, unspecified  chronicity Onset over the past 7 days. Treatment in May 2017 helped for a while. Some drainage from the ear onto pillow last night. No fever or congestion. AC>BC to 256 fork with lateralization to the left. Will treat with Z-pak again and add cortisporin ear drops. If no better with this regimen, will need ENT referral. Continue Flonase at bedtime. - azithromycin (ZITHROMAX) 250 MG tablet; Two tablets by mouth first day then one tablet daily for 4 days.  Dispense: 6 tablet; Refill: 0 - neomycin-polymyxin-hydrocortisone (CORTISPORIN) otic solution; Place 3 drops into the right ear 4 (four) times daily.  Dispense: 10 mL; Refill: 0  2. Migraine without aura and without status migrainosus, not intractable Less frequent headaches since having ears pierced bilaterally. Rarely has a throbbing generalized headache and need to take Norco. Has not had a full blown migraine requiring ER visit or injectable analgesics. Will refill Norco and follow up in 3 months prn.  - HYDROcodone-acetaminophen (NORCO/VICODIN) 5-325 MG tablet; Take 1 tablet by mouth every 6 (six) hours as needed. For pain  Dispense: 30 tablet; Refill: [place], PA  [facility]

## 2015-09-25 ENCOUNTER — Other Ambulatory Visit: Payer: Self-pay | Admitting: Physician Assistant

## 2015-09-25 DIAGNOSIS — K2270 Barrett's esophagus without dysplasia: Secondary | ICD-10-CM

## 2015-10-03 ENCOUNTER — Ambulatory Visit (INDEPENDENT_AMBULATORY_CARE_PROVIDER_SITE_OTHER): Payer: 59 | Admitting: Family Medicine

## 2015-10-03 ENCOUNTER — Encounter: Payer: Self-pay | Admitting: Family Medicine

## 2015-10-03 VITALS — BP 100/80 | HR 64 | Temp 98.0°F | Resp 15 | Wt 112.0 lb

## 2015-10-03 DIAGNOSIS — G43919 Migraine, unspecified, intractable, without status migrainosus: Secondary | ICD-10-CM

## 2015-10-03 MED ORDER — DIPHENHYDRAMINE HCL 50 MG/ML IJ SOLN
50.0000 mg | Freq: Once | INTRAMUSCULAR | Status: AC
  Administered 2015-10-03: 50 mg via INTRAMUSCULAR

## 2015-10-03 MED ORDER — KETOROLAC TROMETHAMINE 60 MG/2ML IM SOLN
60.0000 mg | Freq: Once | INTRAMUSCULAR | Status: AC
  Administered 2015-10-03: 60 mg via INTRAMUSCULAR

## 2015-10-03 NOTE — Patient Instructions (Signed)
Take promethazine as needed for nausea. Caution against sedation with Benadryl.

## 2015-10-03 NOTE — Progress Notes (Signed)
Subjective:     Patient ID: Jordan Ortega, female   DOB: Dec 29, 1955, 60 y.o.   MRN: [id number]  HPI  Chief Complaint  Patient presents with  . Migraine    Patient comes in office today with complaints of migraine headache for the past 3 days. Patient reports that she has a history of migraine headaches, she is not currently on medication to treat. Patient reports that she has tried otc BC powder. Associated symptoms include sensitivity to light and nausea.   States she has not had a bad migraine headache in 1.5 years. Has been taking BC powders daily for this headache. Reports she gets itching with Toradol but tolerates it when given Benadryl. Wishes injection today. Accompanied by her husband.   Review of Systems     Objective:   Physical Exam  Constitutional: She appears well-developed and well-nourished. She has a sickly appearance.  Eyes: EOM are normal. Pupils are equal, round, and reactive to light.  Musculoskeletal:  Grip strength 5/5 symmetrically.  Neurological: She is alert.       Assessment:    1. Intractable migraine without status migrainosus, unspecified migraine type - ketorolac (TORADOL) injection 60 mg; Inject 2 mLs (60 mg total) into the muscle once. - diphenhydrAMINE (BENADRYL) injection 50 mg; Inject 1 mL (50 mg total) into the muscle once (gave 25 mg/0.51ml.    Plan:    Take home promethazine for nausea.

## 2015-10-28 ENCOUNTER — Ambulatory Visit (INDEPENDENT_AMBULATORY_CARE_PROVIDER_SITE_OTHER): Payer: 59 | Admitting: Family Medicine

## 2015-10-28 ENCOUNTER — Encounter: Payer: Self-pay | Admitting: Family Medicine

## 2015-10-28 VITALS — BP 102/78 | HR 84 | Temp 98.7°F | Wt 110.2 lb

## 2015-10-28 DIAGNOSIS — J0100 Acute maxillary sinusitis, unspecified: Secondary | ICD-10-CM | POA: Diagnosis not present

## 2015-10-28 DIAGNOSIS — H6981 Other specified disorders of Eustachian tube, right ear: Secondary | ICD-10-CM | POA: Diagnosis not present

## 2015-10-28 DIAGNOSIS — F39 Unspecified mood [affective] disorder: Secondary | ICD-10-CM | POA: Diagnosis not present

## 2015-10-28 MED ORDER — QUETIAPINE FUMARATE 25 MG PO TABS: 25.0000 mg | ORAL_TABLET | Freq: Every day | ORAL | 1 refills | Status: DC

## 2015-10-28 MED ORDER — AMOXICILLIN 875 MG PO TABS: 875.0000 mg | ORAL_TABLET | Freq: Two times a day (BID) | ORAL | 0 refills | Status: DC

## 2015-10-28 NOTE — Progress Notes (Signed)
[name] Female    DOB: [birth year]   60 y.o.   MRN: [id number] Visit Date: 10/28/2015  Today's Provider: Vernie Murders, PA   Chief Complaint  Patient presents with  . Sinusitis   Subjective:    Sinusitis  This is a new problem. The current episode started in the past 7 days. The problem is unchanged. Associated symptoms include congestion, ear pain and sinus pressure. Treatments tried: allergy medication. The treatment provided no relief.   Past Medical History:  Diagnosis Date  . Bell palsy   . Migraines    Patient Active Problem List   Diagnosis Date Noted  . 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 Acute onset aura migraine 07/10/2014  . Bad memory 07/10/2014  . Migraine without aura 07/10/2014  . Abdominal pain, epigastric 07/10/2014  . Chills with fever 07/10/2014  . Chronic constipation 07/10/2014  . Migraines   . Migraine 11/28/2011  . Weakness of left side of body 11/28/2011  . Acute anxiety 11/20/2009  . DEPRESSION 11/20/2009  . FOOT PAIN 11/20/2009  . Hypercholesterolemia without hypertriglyceridemia 06/18/2009  . 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 Malaise and fatigue 10/29/2008  . Abnormal loss of weight 10/29/2008  . 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 Affective bipolar disorder ([place]) 05/17/2008  . Barrett esophagus 05/17/2008   Past Surgical History:  Procedure Laterality Date  . ABDOMINAL HYSTERECTOMY    . CHOLECYSTECTOMY     Family History  Problem Relation Age of Onset  . Migraines Sister    Allergies  Allergen Reactions  . Ciprofloxacin Other (See Comments)  . Codeine Hives and Itching    REACTION: Dizziness  . Diazepam Other (See Comments)    Patient states "it makes me crazy" REACTION: Behavioral problems  . Iodinated Diagnostic Agents Other (See Comments)  . Metoprolol   . Morphine Hives  . Niacin Other (See Comments)  . Niacin And  Related Other (See Comments)    flushing  . Toradol [Ketorolac Tromethamine] Itching    Tolerates when taken with Benadryl     Previous Medications   ALPRAZOLAM (XANAX) 0.5 MG TABLET    Take 1 tablet (0.5 mg total) by mouth 3 (three) times daily as needed for anxiety.   AMITRIPTYLINE (ELAVIL) 150 MG TABLET    TAKE ONE TABLET BY MOUTH NIGHTLY AT BEDTIME   FLUTICASONE (FLONASE) 50 MCG/ACT NASAL SPRAY    Place 2 sprays into both nostrils daily.   HYDROCODONE-ACETAMINOPHEN (NORCO/VICODIN) 5-325 MG TABLET    Take 1 tablet by mouth every 6 (six) hours as needed. For pain   PANTOPRAZOLE (PROTONIX) 40 MG TABLET    TAKE 1 TABLET (40 MG TOTAL) BY MOUTH DAILY.   PROMETHAZINE (PHENERGAN) 25 MG TABLET    Take 1 tablet (25 mg total) by mouth 4 (four) times daily as needed.   SIMVASTATIN (ZOCOR) 40 MG TABLET    TAKE ONE TABLET BY MOUTH NIGHTLY AT BEDTIME    Review of Systems  Constitutional: Negative.   HENT: Positive for congestion, ear pain and sinus pressure.   Respiratory: Negative.   Cardiovascular: Negative.     Social History  Substance Use Topics  . Smoking status: Current Every Day Smoker    Packs/day: 0.50    Years: 30.00    Types: Cigarettes  . Smokeless tobacco: Not on file  . Alcohol use 0.0 oz/week     Comment: occasional   Objective:   BP 102/78 (  BP Location: Right Arm, Patient Position: Sitting, Cuff Size: Normal)   Pulse 84   Temp 98.7 F (37.1 C) (Oral)   Wt 110 lb 3.2 oz (50 kg)   BMI 21.52 kg/m   Physical Exam  Constitutional: She is oriented to person, place, and time. She appears well-developed and well-nourished. No distress.  HENT:  Head: Normocephalic and atraumatic.  Right Ear: Hearing and external ear normal.  Left Ear: Hearing and external ear normal.  Nose: Nose normal.  Mouth/Throat: Oropharynx is clear and moist.  Hazy right TM with slight bulging. No transillumination of maxillary sinuses. Minimal sinus tenderness.  Eyes: Conjunctivae and lids are  normal. Right eye exhibits no discharge. Left eye exhibits no discharge. No scleral icterus.  Cardiovascular: Normal rate and regular rhythm.   Pulmonary/Chest: Effort normal and breath sounds normal. No respiratory distress.  Musculoskeletal: Normal range of motion.  Neurological: She is alert and oriented to person, place, and time.  Skin: Skin is intact. No lesion and no rash noted.  Psychiatric: Her speech is normal. Judgment and thought content normal. Her mood appears anxious. She is agitated. Cognition and memory are impaired. She exhibits a depressed mood. She expresses no suicidal plans.  Difficulty initiating sleep.      Assessment & Plan:     1. Subacute maxillary sinusitis Onset after a trip to the mountains with pressure in maxillary sinuses and stopped up sensation in ears. No fever or sore throat. Will treat with Amoxil and Mucinex. Recheck prn. - amoxicillin (AMOXIL) 875 MG tablet; Take 1 tablet (875 mg total) by mouth 2 (two) times daily.  Dispense: 20 tablet; Refill: 0  2. Eustachian tube dysfunction, right Onset after a trip to the mountains and having some sinus pressure. Continue nasal steroid nasal spray and use Claritin prn.  3. Mood disorder ([place]) Having difficulty with getting to sleep at night and concentration decrease with irritability at work. Will taper down by half tablet of Amitriptyline to 75 mg qd for 2 weeks, then 1/4 tablet for a week before starting Seroquel. If improved sleep and no significant recurrence of migraines, will completely taper off the Amitriptyline and recheck in a month. - QUEtiapine (SEROQUEL) 25 MG tablet; Take 1 tablet (25 mg total) by mouth at bedtime.  Dispense: 30 tablet; Refill: 1

## 2015-10-28 NOTE — Patient Instructions (Signed)
Schizoaffective Disorder Schizoaffective disorder (ScAD) is a mental illness. It causes symptoms that are a mixture of schizophrenia (a psychotic disorder) and an affective (mood) disorder. The schizophrenic symptoms may include delusions, hallucinations, or odd behavior. The mood symptoms may be similar to major depression or bipolar disorder. ScAD may interfere with personal relationships or normal daily activities. People with ScAD are at increased risk for job loss, social isolation,physical health problems, anxiety and substance use disorders, and suicide. ScAD usually occurs in cycles. Periods of severe symptoms are followed by periods of less severe symptoms or improvement. The illness affects men and women equally but usually appears at an earlier age (teenage or early adult years) in men. People who have family members with schizophrenia, bipolar disorder, or ScAD are at higher risk of developing ScAD. SYMPTOMS  At any one time, people with ScAD may have psychotic symptoms only or both psychotic and mood symptoms. The psychotic symptoms include one or more of the following:  Hearing, seeing, or feeling things that are not there (hallucinations).   Having fixed, false beliefs (delusions). The delusions usually are of being attacked, harassed, cheated, persecuted, or conspired against (paranoid delusions).  Speaking in a way that makes no sense to others (disorganized speech). The psychotic symptoms of ScAD may also include confusing or odd behavior or any of the negative symptoms of schizophrenia. These include loss of motivation for normal daily activities, such as bathing or grooming, withdrawal from other people, and lack of emotions.  The mood symptoms of ScAD occur more often than not. They resemble major depressive disorder or bipolar mania. Symptoms of major depression include depressed mood and four or more of the following:  Loss of interest in usually pleasurable activities  (anhedonia).  Sleeping more or less than normal.  Feeling worthless or excessively guilty.  Lack of energy or motivation.  Trouble concentrating.  Eating more or less than usual.  Thinking a lot about death or suicide. Symptoms of bipolar mania include abnormally elevated or irritable mood and increased energy or activity, plus three or more of the following:   More confidence than normal or feeling that you are able to do anything (grandiosity).  Feeling rested with less sleep than normal.   Being easily distracted.   Talking more than usual or feeling pressured to keep talking.   Feeling that your thoughts are racing.  Engaging in high-risk activities such as buying sprees or foolish business decisions. DIAGNOSIS  ScAD is diagnosed through an assessment by your health care provider. Your health care provider will observe and ask questions about your thoughts, behavior, mood, and ability to function in daily life. Your health care provider may also ask questions about your medical history and use of drugs, including prescription medicines. Your health care provider may also order blood tests and imaging exams. Certain medical conditions and substances can cause symptoms that resemble ScAD. Your health care provider may refer you to a mental health specialist for evaluation.  ScAD is divided into two types. The depressive type is diagnosed if your mood symptoms are limited to major depression. The bipolar type is diagnosed if your mood symptoms are manic or a mixture of manic and depressive symptoms TREATMENT  ScAD is usually a lifelong illness. Long-term treatment is necessary. The following treatments are available:  Medicine. Different types of medicine are used to treat ScAD. The exact combination depends on the type and severity of your symptoms. Antipsychotic medicine is used to control psychotic symptomssuch as delusions, paranoia,   and hallucinations. Mood stabilizers can  even the highs and lows of bipolar manic mood swings. Antidepressant medicines are used to treat major depressive symptoms.  Counseling or talk therapy. Individual, group, or family counseling may be helpful in providing education, support, and guidance. Many people with ScAD also benefit from social skills and job skills (vocational) training. A combination of medicine and counseling is usually best for managing the disorder over time. A procedure in which electricity is applied to the brain through the scalp (electroconvulsive therapy) may be used to treat people with severe manic symptoms that do not respond to medicine and counseling. HOME CARE INSTRUCTIONS   Take all your medicine as prescribed.  Check with your health care provider before starting new prescription or over-the-counter medicines.  Keep all follow up appointments with your health care provider. SEEK MEDICAL CARE IF:   If you are not able to take your medicines as prescribed.  If your symptoms get worse. SEEK IMMEDIATE MEDICAL CARE IF:   You have serious thoughts about hurting yourself or others.   This information is not intended to replace advice given to you by your health care provider. Make sure you discuss any questions you have with your health care provider.   Document Released: 05/31/2006 Document Revised: 02/08/2014 Document Reviewed: 09/01/2012 Elsevier Interactive Patient Education 2016 [place]. Insomnia Insomnia is a sleep disorder that makes it difficult to fall asleep or to stay asleep. Insomnia can cause tiredness (fatigue), low energy, difficulty concentrating, mood swings, and poor performance at work or school.  There are three different ways to classify insomnia:  Difficulty falling asleep.  Difficulty staying asleep.  Waking up too early in the morning. Any type of insomnia can be long-term (chronic) or short-term (acute). Both are common. Short-term insomnia usually lasts for three  months or less. Chronic insomnia occurs at least three times a week for longer than three months. CAUSES  Insomnia may be caused by another condition, situation, or substance, such as:  Anxiety.  Certain medicines.  Gastroesophageal reflux disease (GERD) or other gastrointestinal conditions.  Asthma or other breathing conditions.  Restless legs syndrome, sleep apnea, or other sleep disorders.  Chronic pain.  Menopause. This may include hot flashes.  Stroke.  Abuse of alcohol, tobacco, or illegal drugs.  Depression.  Caffeine.   Neurological disorders, such as Alzheimer disease.  An overactive thyroid (hyperthyroidism). The cause of insomnia may not be known. RISK FACTORS Risk factors for insomnia include:  Gender. Women are more commonly affected than men.  Age. Insomnia is more common as you get older.  Stress. This may involve your professional or personal life.  Income. Insomnia is more common in people with lower income.  Lack of exercise.   Irregular work schedule or night shifts.  Traveling between different time zones. SIGNS AND SYMPTOMS If you have insomnia, trouble falling asleep or trouble staying asleep is the main symptom. This may lead to other symptoms, such as:  Feeling fatigued.  Feeling nervous about going to sleep.  Not feeling rested in the morning.  Having trouble concentrating.  Feeling irritable, anxious, or depressed. TREATMENT  Treatment for insomnia depends on the cause. If your insomnia is caused by an underlying condition, treatment will focus on addressing the condition. Treatment may also include:   Medicines to help you sleep.  Counseling or therapy.  Lifestyle adjustments. HOME CARE INSTRUCTIONS   Take medicines only as directed by your health care provider.  Keep regular sleeping and waking hours.  Avoid naps.  Keep a sleep diary to help you and your health care provider figure out what could be causing your  insomnia. Include:   When you sleep.  When you wake up during the night.  How well you sleep.   How rested you feel the next day.  Any side effects of medicines you are taking.  What you eat and drink.   Make your bedroom a comfortable place where it is easy to fall asleep:  Put up shades or special blackout curtains to block light from outside.  Use a white noise machine to block noise.  Keep the temperature cool.   Exercise regularly as directed by your health care provider. Avoid exercising right before bedtime.  Use relaxation techniques to manage stress. Ask your health care provider to suggest some techniques that may work well for you. These may include:  Breathing exercises.  Routines to release muscle tension.  Visualizing peaceful scenes.  Cut back on alcohol, caffeinated beverages, and cigarettes, especially close to bedtime. These can disrupt your sleep.  Do not overeat or eat spicy foods right before bedtime. This can lead to digestive discomfort that can make it hard for you to sleep.  Limit screen use before bedtime. This includes:  Watching TV.  Using your smartphone, tablet, and computer.  Stick to a routine. This can help you fall asleep faster. Try to do a quiet activity, brush your teeth, and go to bed at the same time each night.  Get out of bed if you are still awake after 15 minutes of trying to sleep. Keep the lights down, but try reading or doing a quiet activity. When you feel sleepy, go back to bed.  Make sure that you drive carefully. Avoid driving if you feel very sleepy.  Keep all follow-up appointments as directed by your health care provider. This is important. SEEK MEDICAL CARE IF:   You are tired throughout the day or have trouble in your daily routine due to sleepiness.  You continue to have sleep problems or your sleep problems get worse. SEEK IMMEDIATE MEDICAL CARE IF:   You have serious thoughts about hurting yourself  or someone else.   This information is not intended to replace advice given to you by your health care provider. Make sure you discuss any questions you have with your health care provider.   Document Released: 01/16/2000 Document Revised: 10/09/2014 Document Reviewed: 10/19/2013 Elsevier Interactive Patient Education [employer].

## 2015-12-02 ENCOUNTER — Emergency Department: Payer: 59

## 2015-12-02 ENCOUNTER — Ambulatory Visit (INDEPENDENT_AMBULATORY_CARE_PROVIDER_SITE_OTHER): Payer: 59 | Admitting: Family Medicine

## 2015-12-02 ENCOUNTER — Emergency Department
Admission: EM | Admit: 2015-12-02 | Discharge: 2015-12-02 | Disposition: A | Discharge status: Home / Self Care | Payer: 59 | Attending: Emergency Medicine | Admitting: Emergency Medicine

## 2015-12-02 ENCOUNTER — Encounter: Payer: Self-pay | Admitting: Emergency Medicine

## 2015-12-02 ENCOUNTER — Encounter: Payer: Self-pay | Admitting: Family Medicine

## 2015-12-02 VITALS — BP 112/56 | HR 116 | Temp 98.3°F

## 2015-12-02 DIAGNOSIS — R55 Syncope and collapse: Secondary | ICD-10-CM

## 2015-12-02 DIAGNOSIS — R509 Fever, unspecified: Secondary | ICD-10-CM

## 2015-12-02 DIAGNOSIS — R079 Chest pain, unspecified: Secondary | ICD-10-CM

## 2015-12-02 DIAGNOSIS — B349 Viral infection, unspecified: Secondary | ICD-10-CM | POA: Diagnosis not present

## 2015-12-02 DIAGNOSIS — R0789 Other chest pain: Secondary | ICD-10-CM | POA: Diagnosis not present

## 2015-12-02 DIAGNOSIS — Z79899 Other long term (current) drug therapy: Secondary | ICD-10-CM | POA: Diagnosis not present

## 2015-12-02 DIAGNOSIS — F17210 Nicotine dependence, cigarettes, uncomplicated: Secondary | ICD-10-CM | POA: Insufficient documentation

## 2015-12-02 LAB — URINALYSIS COMPLETE WITH MICROSCOPIC (ARMC ONLY)
BACTERIA UA: NONE SEEN
Bilirubin Urine: NEGATIVE
Glucose, UA: NEGATIVE mg/dL
Ketones, ur: NEGATIVE mg/dL
Leukocytes, UA: NEGATIVE
Nitrite: NEGATIVE
PH: 5 (ref 5.0–8.0)
PROTEIN: NEGATIVE mg/dL
Specific Gravity, Urine: 1.005 (ref 1.005–1.030)

## 2015-12-02 LAB — BASIC METABOLIC PANEL
Anion gap: 10 (ref 5–15)
BUN: 14 mg/dL (ref 6–20)
CHLORIDE: 105 mmol/L (ref 101–111)
CO2: 23 mmol/L (ref 22–32)
Calcium: 9.5 mg/dL (ref 8.9–10.3)
Creatinine, Ser: 0.81 mg/dL (ref 0.44–1.00)
GFR calc Af Amer: >60 mL/min (ref >60)
GLUCOSE: 115 mg/dL — AB (ref 65–99)
POTASSIUM: 3.4 mmol/L — AB (ref 3.5–5.1)
Sodium: 138 mmol/L (ref 135–145)

## 2015-12-02 LAB — CBC
HEMATOCRIT: 42.3 % (ref 35.0–47.0)
Hemoglobin: 14.8 g/dL (ref 12.0–16.0)
MCH: 33 pg (ref 26.0–34.0)
MCHC: 34.9 g/dL (ref 32.0–36.0)
MCV: 94.4 fL (ref 80.0–100.0)
Platelets: 228 10*3/uL (ref 150–440)
RBC: 4.48 MIL/uL (ref 3.80–5.20)
RDW: 13.5 % (ref 11.5–14.5)
WBC: 8.6 10*3/uL (ref 3.6–11.0)

## 2015-12-02 LAB — TROPONIN I: Troponin I: <0.03 ng/mL (ref <0.03)

## 2015-12-02 MED ORDER — AZITHROMYCIN 250 MG PO TABS: ORAL_TABLET | ORAL | 0 refills | Status: DC

## 2015-12-02 MED ORDER — ALBUTEROL SULFATE HFA 108 (90 BASE) MCG/ACT IN AERS: 2.0000 | INHALATION_SPRAY | Freq: Four times a day (QID) | RESPIRATORY_TRACT | 2 refills | Status: DC | PRN

## 2015-12-02 MED ORDER — SODIUM CHLORIDE 0.9 % IV BOLUS (SEPSIS)
1000.0000 mL | Freq: Once | INTRAVENOUS | Status: AC
  Administered 2015-12-02: 1000 mL via INTRAVENOUS

## 2015-12-02 MED ORDER — DIPHENHYDRAMINE HCL 50 MG/ML IJ SOLN
50.0000 mg | Freq: Once | INTRAMUSCULAR | Status: AC
  Administered 2015-12-02: 50 mg via INTRAVENOUS

## 2015-12-02 MED ORDER — IPRATROPIUM-ALBUTEROL 0.5-2.5 (3) MG/3ML IN SOLN
3.0000 mL | Freq: Once | RESPIRATORY_TRACT | Status: AC
  Administered 2015-12-02: 3 mL via RESPIRATORY_TRACT
  Filled 2015-12-02: qty 3

## 2015-12-02 MED ORDER — DIPHENHYDRAMINE HCL 50 MG/ML IJ SOLN
INTRAMUSCULAR | Status: AC
  Administered 2015-12-02: 50 mg via INTRAVENOUS
  Filled 2015-12-02: qty 1

## 2015-12-02 MED ORDER — KETOROLAC TROMETHAMINE 30 MG/ML IJ SOLN
30.0000 mg | Freq: Once | INTRAMUSCULAR | Status: AC
  Administered 2015-12-02: 30 mg via INTRAVENOUS
  Filled 2015-12-02: qty 1

## 2015-12-02 MED ORDER — METOCLOPRAMIDE HCL 5 MG/ML IJ SOLN
10.0000 mg | Freq: Once | INTRAMUSCULAR | Status: AC
  Administered 2015-12-02: 10 mg via INTRAVENOUS
  Filled 2015-12-02: qty 2

## 2015-12-02 MED ORDER — AZITHROMYCIN 500 MG PO TABS
500.0000 mg | ORAL_TABLET | Freq: Once | ORAL | Status: AC
  Administered 2015-12-02: 500 mg via ORAL
  Filled 2015-12-02: qty 1

## 2015-12-02 NOTE — ED Provider Notes (Signed)
[facility] Provider Note  ____________________________________________  Time seen: Approximately 12:54 PM  I have reviewed the triage vital signs and the nursing notes.   HISTORY  Chief Complaint Chest Pain   HPI Jordan Ortega is a 60 y.o. female with a history of migraines, hyperlipidemia, and active smoking who presents for evaluation of chest pain, body aches and fever. Patient reports that yesterday at lunchtime she was at work and she started having body aches and had a fever of 101F. she went home, took some Tylenol and took a nap. When she woke up she started having chest tightness. She describes it as mild, diffuse across her chest, that has been present intermittently since yesterday. She denies wheezing or coughing. She does not carry a diagnosis of COPD but does smoke a pack of cigarettes. She is also complaining of b/l ear pain and drainage and diarrhea that started last night. She reports she has had several episodes of watery diarrhea with no melena and no bright red blood per rectum. She does endorse nausea but no vomiting, no abdominal pain, no dysuria, no hematuria, no vaginal discharge. No recent abx use. Patient denies personal history of ischemic heart disease of blood clots, she does have a history of CABG on her dad, no family history blood clots, no recent travel or immobilization, no leg pain or swelling, no hemoptysis, no exogenous hormones.  Past Medical History:  Diagnosis Date  . Bell palsy   . Migraines     Patient Active Problem List   Diagnosis Date Noted  . 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 Acute onset aura migraine 07/10/2014  . Bad memory 07/10/2014  . Migraine without aura 07/10/2014  . Abdominal pain, epigastric 07/10/2014  . Chills with fever 07/10/2014  . Chronic constipation 07/10/2014  . Migraines   . Migraine 11/28/2011  . Weakness of left side of body 11/28/2011  . Acute anxiety  11/20/2009  . DEPRESSION 11/20/2009  . FOOT PAIN 11/20/2009  . Hypercholesterolemia without hypertriglyceridemia 06/18/2009  . 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 Malaise and fatigue 10/29/2008  . Abnormal loss of weight 10/29/2008  . 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 Affective bipolar disorder ([place]) 05/17/2008  . Barrett esophagus 05/17/2008    Past Surgical History:  Procedure Laterality Date  . ABDOMINAL HYSTERECTOMY    . CHOLECYSTECTOMY      Prior to Admission medications   Medication Sig Start Date End Date Taking? Authorizing Provider  ALPRAZolam Duanne Moron) 0.5 MG tablet Take 1 tablet (0.5 mg total) by mouth 3 (three) times daily as needed for anxiety. 07/02/15  Yes Mar Daring, PA-C  amitriptyline (ELAVIL) 150 MG tablet TAKE ONE TABLET BY MOUTH NIGHTLY AT BEDTIME 07/28/15  Yes Dennis E Chrismon, PA  HYDROcodone-acetaminophen (NORCO/VICODIN) 5-325 MG tablet Take 1 tablet by mouth every 6 (six) hours as needed. For pain 08/18/15  Yes Dennis E Chrismon, PA  pantoprazole (PROTONIX) 40 MG tablet TAKE 1 TABLET (40 MG TOTAL) BY MOUTH DAILY. 09/25/15  Yes Clearnce Sorrel Burnette, PA-C  QUEtiapine (SEROQUEL) 25 MG tablet Take 1 tablet (25 mg total) by mouth at bedtime. 10/28/15  Yes Dennis E Chrismon, PA  simvastatin (ZOCOR) 40 MG tablet TAKE ONE TABLET BY MOUTH NIGHTLY AT BEDTIME 06/26/15  Yes Dennis E Chrismon, PA  albuterol (PROVENTIL HFA;VENTOLIN HFA) 108 (90 Base) MCG/ACT inhaler Inhale 2 puffs into the lungs every 6 (six) hours as needed  for wheezing or shortness of breath. 12/02/15   Rudene Re, MD  azithromycin (ZITHROMAX) 250 MG tablet Take 1 a day for 4 days 12/02/15   Rudene Re, MD    Allergies Ciprofloxacin; Codeine; Diazepam; Iodinated diagnostic agents; Metoprolol; Morphine; Niacin; Niacin and related; and Toradol [ketorolac tromethamine]  Family History  Problem Relation Age of  Onset  . Migraines Sister     Social History Social History  Substance Use Topics  . Smoking status: Current Every Day Smoker    Packs/day: 0.50    Years: 30.00    Types: Cigarettes  . Smokeless tobacco: Never Used  . Alcohol use 0.0 oz/week     Comment: occasional    Review of Systems  Constitutional: + fever and body aches Eyes: Negative for visual changes. ENT: Negative for sore throat. + b/l ear pain  Cardiovascular: + chest pain. Respiratory: Negative for shortness of breath. Gastrointestinal: Negative for abdominal pain, vomiting. + nausea and  diarrhea. Genitourinary: Negative for dysuria. Musculoskeletal: Negative for back pain. Skin: Negative for rash. Neurological: Negative for headaches, weakness or numbness.  ____________________________________________   PHYSICAL EXAM:  VITAL SIGNS: ED Triage Vitals  Enc Vitals Group     BP 12/02/15 1127 119/79     Pulse Rate 12/02/15 1127 95     Resp 12/02/15 1127 (!) 22     Temp 12/02/15 1127 98.9 F (37.2 C)     Temp Source 12/02/15 1127 Oral     SpO2 12/02/15 1127 99 %     Weight 12/02/15 1127 110 lb (49.9 kg)     Height 12/02/15 1127 5' (1.524 m)     Head Circumference --      Peak Flow --      Pain Score 12/02/15 1142 4     Pain Loc --      Pain Edu? --      Excl. in [place]? --     Constitutional: Alert and oriented. Well appearing and in no apparent distress. HEENT:      Head: Normocephalic and atraumatic.         Eyes: Conjunctivae are normal. Sclera is non-icteric. EOMI. PERRL      Mouth/Throat: Mucous membranes are moist.       Neck: Supple with no signs of meningismus. Cardiovascular: Regular rate and rhythm. No murmurs, gallops, or rubs. 2+ symmetrical distal pulses are present in all extremities. No JVD. Respiratory: Normal respiratory effort. Lungs are clear to auscultation bilaterally. No wheezes, crackles, or rhonchi. Decrease air movement bilaterally Gastrointestinal: Soft, non tender, and non  distended with positive bowel sounds. No rebound or guarding. Genitourinary: No CVA tenderness. Musculoskeletal: Nontender with normal range of motion in all extremities. No edema, cyanosis, or erythema of extremities. Neurologic: Normal speech and language. Face is symmetric. Moving all extremities. No gross focal neurologic deficits are appreciated. Skin: Skin is warm, dry and intact. No rash noted. Psychiatric: Mood and affect are normal. Speech and behavior are normal.  ____________________________________________   LABS (all labs ordered are listed, but only abnormal results are displayed)  Labs Reviewed  BASIC METABOLIC PANEL - Abnormal; Notable for the following:       Result Value   Potassium 3.4 (*)    Glucose, Bld 115 (*)    All other components within normal limits  URINALYSIS COMPLETEWITH MICROSCOPIC ([facility] ONLY) - Abnormal; Notable for the following:    Color, Urine STRAW (*)    APPearance CLEAR (*)    Hgb urine dipstick 2+ (*)  Squamous Epithelial / LPF 0-5 (*)    All other components within normal limits  URINE CULTURE  CBC  TROPONIN I  TROPONIN I   ____________________________________________  EKG  ED ECG REPORT I, Rudene Re, the attending physician, personally viewed and interpreted this ECG. Normal sinus rhythm, rate of 98, normal intervals, normal axis, diffuse ST depressions on the inferior and lateral leads which are more pronounced from prior from 26-May-2011. No ST elevations.  14:47 - Normal sinus rhythm, rate of 74, normal intervals, normal axis, no ST elevations, mild ST depression in the inferior leads which are unchanged from prior. ____________________________________________  RADIOLOGY  CXR: negative ____________________________________________   PROCEDURES  Procedure(s) performed: None Procedures Critical Care performed:  None ____________________________________________   INITIAL IMPRESSION / ASSESSMENT AND PLAN / ED  COURSE  60 y.o. female with a history of migraines, hyperlipidemia, and active smoking who presents for evaluation of chest tightness, body aches, diarrhea and fever concerning for viral syndrome and possible mild COPD exacerbation with decreased air movement bilaterally. Patient's EKG shows diffuse ST depressions that were present since 2011-05-26 but a little bit more pronounced this time possibly from dehydration and mild tachycardia. We'll give IV fluids, IV reglan, IV toradol, cycle cardiac markers and repeat EKG. Will give duoneb and reassess.  Clinical Course  Comment By Time  Chest tightness resolved after duoneb. Troponin is negative. Remaining of patient's blood work and chest x-ray with no acute findings. We'll get a repeat troponin and EKG. If those are unchanged patient be discharged home on Z-Pak and albuterol with close follow-up with primary care physician. Rudene Re, MD 10/31 1343  Patient feels markedly improved after IV fluids and Reglan. She is tolerating by mouth. Her vital signs are within normal limits. Her repeat EKG is stable. Her second troponin is pending. I discussed with patient that I would like for her to follow up with her PCP within the next week for follow-up for this visit and also to discuss possibility of a stress test as patient did have worsening ST depressions in the setting of mild tachycardia concerning for possible mild coronary artery disease. Patient is in agreement. Patient be discharged home with a Z-Pak and albuterol after second troponin is resulted. Rudene Re, MD 10/31 1455    Pertinent labs & imaging results that were available during my care of the patient were reviewed by me and considered in my medical decision making (see chart for details).    ____________________________________________   FINAL CLINICAL IMPRESSION(S) / ED DIAGNOSES  Final diagnoses:  Viral syndrome  Chest pain, unspecified type  Fever, unspecified fever cause       NEW MEDICATIONS STARTED DURING THIS VISIT:  New Prescriptions   ALBUTEROL (PROVENTIL HFA;VENTOLIN HFA) 108 (90 BASE) MCG/ACT INHALER    Inhale 2 puffs into the lungs every 6 (six) hours as needed for wheezing or shortness of breath.   AZITHROMYCIN (ZITHROMAX) 250 MG TABLET    Take 1 a day for 4 days     Note:  This document was prepared using Dragon voice recognition software and may include unintentional dictation errors.    Rudene Re, MD 12/02/15 1524

## 2015-12-02 NOTE — Discharge Instructions (Signed)

## 2015-12-02 NOTE — Progress Notes (Signed)
[name] Female    DOB: Oct 13, 1955   60 y.o.   MRN: [id number] Visit Date: 12/02/2015  Today's Provider: Vernie Murders, PA     Subjective:    Chest Pain   This is a new problem. The current episode started yesterday. The problem occurs constantly. The problem has been gradually worsening. The pain is present in the substernal region. The pain is severe. The quality of the pain is described as tightness and squeezing. The pain does not radiate. Associated symptoms include headaches and syncope. Pertinent negatives include no cough, fever, shortness of breath or vomiting.  Fainted in the office lobby.  Past Medical History:  Diagnosis Date  . Bell palsy   . Migraines    Past Surgical History:  Procedure Laterality Date  . ABDOMINAL HYSTERECTOMY    . CHOLECYSTECTOMY     Family History  Problem Relation Age of Onset  . Migraines Sister    Allergies  Allergen Reactions  . Ciprofloxacin Other (See Comments)  . Codeine Hives and Itching    REACTION: Dizziness  . Diazepam Other (See Comments)    Patient states "it makes me crazy" REACTION: Behavioral problems  . Iodinated Diagnostic Agents Other (See Comments)  . Metoprolol   . Morphine Hives  . Niacin Other (See Comments)  . Niacin And Related Other (See Comments)    flushing  . Toradol [Ketorolac Tromethamine] Itching    Tolerates when taken with Benadryl    Previous Medications   ALPRAZOLAM (XANAX) 0.5 MG TABLET    Take 1 tablet (0.5 mg total) by mouth 3 (three) times daily as needed for anxiety.   AMITRIPTYLINE (ELAVIL) 150 MG TABLET    TAKE ONE TABLET BY MOUTH NIGHTLY AT BEDTIME   AMOXICILLIN (AMOXIL) 875 MG TABLET    Take 1 tablet (875 mg total) by mouth 2 (two) times daily.   FLUTICASONE (FLONASE) 50 MCG/ACT NASAL SPRAY    Place 2 sprays into both nostrils daily.   HYDROCODONE-ACETAMINOPHEN (NORCO/VICODIN) 5-325 MG TABLET    Take 1 tablet by mouth every 6 (six) hours as needed. For pain   PANTOPRAZOLE (PROTONIX) 40 MG TABLET    TAKE 1 TABLET (40 MG TOTAL) BY MOUTH DAILY.   PROMETHAZINE (PHENERGAN) 25 MG TABLET    Take 1 tablet (25 mg total) by mouth 4 (four) times daily as needed.   QUETIAPINE (SEROQUEL) 25 MG TABLET    Take 1 tablet (25 mg total) by mouth at bedtime.   SIMVASTATIN (ZOCOR) 40 MG TABLET    TAKE ONE TABLET BY MOUTH NIGHTLY AT BEDTIME    Review of Systems  Constitutional: Negative.  Negative for fever.  Respiratory: Negative.  Negative for cough and shortness of breath.   Cardiovascular: Positive for chest pain and syncope.  Gastrointestinal: Negative for vomiting.  Neurological: Positive for headaches.  Psychiatric/Behavioral: Positive for decreased concentration and sleep disturbance.       Irritability     Social History  Substance Use Topics  . Smoking status: Current Every Day Smoker    Packs/day: 0.50    Years: 30.00    Types: Cigarettes  . Smokeless tobacco: Not on file  . Alcohol use 0.0 oz/week     Comment: occasional   Objective:   BP (!) 112/56 (BP Location: Left Arm, Patient Position: Supine, Cuff Size: Normal)   Pulse (!) 116   Temp 98.3 F (36.8 C) (Oral)   SpO2 100%   Physical Exam  Constitutional: She is oriented to  person, place, and time. She appears well-developed. She appears distressed.  HENT:  Head: Normocephalic.  Eyes: Conjunctivae are normal.  Neck: Neck supple.  Cardiovascular: Normal heart sounds.   Tachycardic and very tender sternum to palpate. Describes squeezing tightness in chest.   Pulmonary/Chest: Effort normal and breath sounds normal.  Abdominal: Soft.  Quiet bowel sounds with generalized tenderness.  Neurological: She is alert and oriented to person, place, and time.  Skin:  No diaphoresis.      Assessment & Plan:     1. Chest pain, unspecified type Patient came to the office for follow up visit of mood disorder and change of medications from Amitriptyline to Seroquel. As she was checking in, she  fainted and collapsed. Initial evaluation as she was taken to an exam room, she was complaining of chest tightness and squeezing sensation. EKG showed ST depressions, short PR intervals and suspicion of subendocardial ischemia. She was give oxygen 2 LPM by nasal cannula and 2 ASA 81 mg while waiting on EMS to transport her to the ER to rule out MI. Taken immediately to the ER. - EKG 12-Lead - EKG 12-Lead  2. Syncope and collapse Onset in the office as she was checking in for follow up visit to recheck mood disorder and medication change.

## 2015-12-02 NOTE — ED Notes (Signed)
Pt helped to the bathroom. Pt ambulating well. Pt in NAD at this time.

## 2015-12-02 NOTE — ED Notes (Addendum)
PT called RN into room and reports " not feeling right" after medication . Pt reports feeling hot and itchy after medication. MD made aware and verbal orders received for 50mg  benadryl.  PT advised to inform RN about any increased itching or difficulty breathing.  Will continue to monitor

## 2015-12-02 NOTE — ED Triage Notes (Signed)
Brought in via ems from PCP with chest pain since yesterday  Pain became worse today  Asa 324 mg given PTA

## 2015-12-02 NOTE — ED Notes (Signed)
Patient transported to X-ray 

## 2015-12-04 LAB — URINE CULTURE: CULTURE: NO GROWTH

## 2015-12-09 ENCOUNTER — Ambulatory Visit (INDEPENDENT_AMBULATORY_CARE_PROVIDER_SITE_OTHER): Payer: 59 | Admitting: Family Medicine

## 2015-12-09 ENCOUNTER — Encounter: Payer: Self-pay | Admitting: Family Medicine

## 2015-12-09 VITALS — BP 108/72 | HR 72 | Temp 98.5°F | Resp 16 | Wt 109.4 lb

## 2015-12-09 DIAGNOSIS — G43009 Migraine without aura, not intractable, without status migrainosus: Secondary | ICD-10-CM | POA: Diagnosis not present

## 2015-12-09 DIAGNOSIS — R071 Chest pain on breathing: Secondary | ICD-10-CM

## 2015-12-09 DIAGNOSIS — F39 Unspecified mood [affective] disorder: Secondary | ICD-10-CM | POA: Diagnosis not present

## 2015-12-09 DIAGNOSIS — G4452 New daily persistent headache (NDPH): Secondary | ICD-10-CM | POA: Diagnosis not present

## 2015-12-09 MED ORDER — HYDROCODONE-ACETAMINOPHEN 5-325 MG PO TABS: 1.0000 | ORAL_TABLET | Freq: Four times a day (QID) | ORAL | 0 refills | Status: DC | PRN

## 2015-12-09 MED ORDER — QUETIAPINE FUMARATE 50 MG PO TABS: 50.0000 mg | ORAL_TABLET | Freq: Every day | ORAL | 1 refills | Status: DC

## 2015-12-09 NOTE — Progress Notes (Signed)
[name] Female    DOB: 11/02/1955   60 y.o.   MRN: [id number] Visit Date: 12/09/2015  Today's Provider: Vernie Murders, PA   Chief Complaint  Patient presents with  . Hospitalization Follow-up   Subjective:    HPI  Follow up Hospitalization  Patient was admitted to [facility] on 12/02/2015 and discharged on 12/02/2015. She was treated for viral syndrome, fever, and unspecified chest pain. Treatment for this included started Albuterol 108 mcg, and Azithromycin 250 mg.Patient reports she was advised to have a stress test.  She reports excellent compliance with treatment. She reports this condition is Improved slightly but still having chest pain, headache, and coughing.  ------------------------------------------------------------------------------------  Patient Active Problem List   Diagnosis Date Noted  . 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 Acute onset aura migraine 07/10/2014  . Bad memory 07/10/2014  . Migraine without aura 07/10/2014  . Abdominal pain, epigastric 07/10/2014  . Chills with fever 07/10/2014  . Chronic constipation 07/10/2014  . Migraines   . Migraine 11/28/2011  . Weakness of left side of body 11/28/2011  . Acute anxiety 11/20/2009  . DEPRESSION 11/20/2009  . FOOT PAIN 11/20/2009  . Hypercholesterolemia without hypertriglyceridemia 06/18/2009  . 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 Malaise and fatigue 10/29/2008  . Abnormal loss of weight 10/29/2008  . 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 Affective bipolar disorder ([place]) 05/17/2008  . Barrett esophagus 05/17/2008   Past Surgical History:  Procedure Laterality Date  . ABDOMINAL HYSTERECTOMY    . CHOLECYSTECTOMY     Family History  Problem Relation Age of Onset  . Migraines Sister    Allergies  Allergen Reactions  . Ciprofloxacin Other (See Comments)  . Codeine Hives and Itching    REACTION:  Dizziness  . Diazepam Other (See Comments)    Patient states "it makes me crazy" REACTION: Behavioral problems  . Iodinated Diagnostic Agents Other (See Comments)  . Metoprolol   . Morphine Hives  . Niacin Other (See Comments)  . Niacin And Related Other (See Comments)    flushing  . Toradol [Ketorolac Tromethamine] Itching    Tolerates when taken with Benadryl     Previous Medications   ALBUTEROL (PROVENTIL HFA;VENTOLIN HFA) 108 (90 BASE) MCG/ACT INHALER    Inhale 2 puffs into the lungs every 6 (six) hours as needed for wheezing or shortness of breath.   ALPRAZOLAM (XANAX) 0.5 MG TABLET    Take 1 tablet (0.5 mg total) by mouth 3 (three) times daily as needed for anxiety.   AMITRIPTYLINE (ELAVIL) 150 MG TABLET    TAKE ONE TABLET BY MOUTH NIGHTLY AT BEDTIME   HYDROCODONE-ACETAMINOPHEN (NORCO/VICODIN) 5-325 MG TABLET    Take 1 tablet by mouth every 6 (six) hours as needed. For pain   PANTOPRAZOLE (PROTONIX) 40 MG TABLET    TAKE 1 TABLET (40 MG TOTAL) BY MOUTH DAILY.   QUETIAPINE (SEROQUEL) 25 MG TABLET    Take 1 tablet (25 mg total) by mouth at bedtime.   SIMVASTATIN (ZOCOR) 40 MG TABLET    TAKE ONE TABLET BY MOUTH NIGHTLY AT BEDTIME    Review of Systems  Constitutional: Negative.   Respiratory: Positive for cough.   Cardiovascular: Positive for chest pain.  Neurological: Positive for headaches.    Social History  Substance Use Topics  . Smoking status: Current Every Day Smoker    Packs/day: 0.50    Years: 30.00    Types: Cigarettes  .  Smokeless tobacco: Never Used  . Alcohol use 0.0 oz/week     Comment: occasional   Objective:   BP 108/72 (BP Location: Right Arm, Patient Position: Sitting, Cuff Size: Normal)   Pulse 72   Temp 98.5 F (36.9 C) (Oral)   Resp 16   Wt 109 lb 6.4 oz (49.6 kg)   BMI 21.37 kg/m   Physical Exam  Constitutional: She is oriented to person, place, and time. She appears well-developed and well-nourished.  HENT:  Head: Normocephalic.  Right  Ear: External ear normal.  Left Ear: External ear normal.  Nose: Nose normal.  Mouth/Throat: Oropharynx is clear and moist.  Eyes: Conjunctivae are normal.  Neck: Normal range of motion. Neck supple.  Cardiovascular: Normal rate, regular rhythm and normal heart sounds.   Pulmonary/Chest: Effort normal and breath sounds normal. She exhibits tenderness.  Very tender along bilateral sternal borders. Increase in pain to compress.  Abdominal: Soft. Bowel sounds are normal.  Neurological: She is alert and oriented to person, place, and time.  Psychiatric: She has a normal mood and affect. Her behavior is normal.      Assessment & Plan:     1. Chest pain on breathing Presented to the office on 12-02-15 and fainted. EKG showed ST depression, short PR intervals and suspicion of subendocardial ischemia. She was transported to the ER by ambulance and evaluation there did not show evidence of infarction (troponins were normal). Was treated with Z-pak. Continues to have very tender sternal borders (costochondritis) but very concerned about possible cardiac issues with her father's history of CAD requiring stents. ER provider suggested she be scheduled for cardiology referral and possible stress test. Will schedule referral (prefers Dr. Saralyn Pilar because that is who takes care of her father). - HYDROcodone-acetaminophen (NORCO/VICODIN) 5-325 MG tablet; Take 1 tablet by mouth every 6 (six) hours as needed. For pain  Dispense: 30 tablet; Refill: 0 - Ambulatory referral to Cardiology  2. New daily persistent headache Onset last week with chest pains. ER visit on 12-02-15 raised question of viral illness. Was treated with Z-pak, chest x-ray was normal and no further fever. Not much help from NSAID or Tylenol. Refill Norco and recheck as needed. Headache is less than last week. - HYDROcodone-acetaminophen (NORCO/VICODIN) 5-325 MG tablet; Take 1 tablet by mouth every 6 (six) hours as needed. For pain  Dispense:  30 tablet; Refill: 0  3. Migraine without aura and without status migrainosus, not intractable Not as frequent with use of Seroquel.  - QUEtiapine (SEROQUEL) 50 MG tablet; Take 1 tablet (50 mg total) by mouth at bedtime.  Dispense: 30 tablet; Refill: 1  4. Mood disorder ([place]) Stable but still not sleeping well. Will increase Seroquel to 50 mg hs. - QUEtiapine (SEROQUEL) 50 MG tablet; Take 1 tablet (50 mg total) by mouth at bedtime.  Dispense: 30 tablet; Refill: 1

## 2015-12-18 ENCOUNTER — Telehealth: Payer: Self-pay

## 2015-12-18 NOTE — Telephone Encounter (Signed)
Schedule follow up as needed

## 2015-12-18 NOTE — Telephone Encounter (Signed)
Spoke with patient. Patient reports first day of being out of work was 12/03/2015 and she is still out. She states she has a follow up appointment on 01/02/2016 with cardiologist and that will determine return to work date.   She said she is still having symptoms and may schedule a follow up appointment with you tomorrow.

## 2015-12-18 NOTE — Telephone Encounter (Signed)
FMLA paperwork received. Patient requested Simona Huh complete paperwork this morning. Need to know which days patient was out of work. LMTCB

## 2015-12-28 ENCOUNTER — Other Ambulatory Visit: Payer: Self-pay | Admitting: Family Medicine

## 2015-12-29 ENCOUNTER — Other Ambulatory Visit: Payer: Self-pay | Admitting: Family Medicine

## 2015-12-31 ENCOUNTER — Ambulatory Visit
Admission: RE | Admit: 2015-12-31 | Discharge: 2015-12-31 | Disposition: A | Discharge status: Home / Self Care | Payer: 59 | Source: Ambulatory Visit | Attending: Physician Assistant | Admitting: Physician Assistant

## 2015-12-31 ENCOUNTER — Encounter: Payer: Self-pay | Admitting: Physician Assistant

## 2015-12-31 ENCOUNTER — Ambulatory Visit (INDEPENDENT_AMBULATORY_CARE_PROVIDER_SITE_OTHER): Payer: 59 | Admitting: Physician Assistant

## 2015-12-31 ENCOUNTER — Telehealth: Payer: Self-pay

## 2015-12-31 VITALS — BP 104/70 | HR 72 | Temp 97.7°F | Resp 16 | Wt 109.0 lb

## 2015-12-31 DIAGNOSIS — J984 Other disorders of lung: Secondary | ICD-10-CM | POA: Diagnosis not present

## 2015-12-31 DIAGNOSIS — R531 Weakness: Secondary | ICD-10-CM | POA: Diagnosis not present

## 2015-12-31 DIAGNOSIS — R0789 Other chest pain: Secondary | ICD-10-CM

## 2015-12-31 MED ORDER — MELOXICAM 7.5 MG PO TABS: 7.5000 mg | ORAL_TABLET | Freq: Two times a day (BID) | ORAL | 0 refills | Status: AC

## 2015-12-31 NOTE — Telephone Encounter (Signed)
-----   Message from Trinna Post, [place] sent at 12/31/2015 10:01 AM EST ----- Patient seen in office today. Prescribed MOBIC for two weeks for chest pain. ASA not listed on medications but reading cardiology note, they had her start ASA 81 mg prophylactic. Can we please call patient and have her hold her ASA for two weeks while she is taking Mobic? Thank you

## 2015-12-31 NOTE — Telephone Encounter (Signed)
-----   Message from Trinna Post, [place] sent at 12/31/2015 11:38 AM EST ----- No acute findings on XRAY. Please remind patient to hold ASA 81 mg while taking her Mobic. Keep cardiology appointment.

## 2015-12-31 NOTE — Progress Notes (Signed)
[name] Female    DOB: [birth year]   60 y.o.   MRN: [id number] Visit Date: 12/31/2015  Today's Provider: Trinna Post, PA-C   Chief Complaint  Patient presents with  . URI    Started two days ago.  . Fever    Pt reports 100.3 temperature yesterday   Subjective:    URI   This is a new problem. The current episode started in the past 7 days. The problem has been gradually worsening. Maximum temperature: Pt reports a fever of 100.3 yesterday. The fever has been present for less than 1 day. Associated symptoms include chest pain, coughing, ear pain, headaches and nausea. Pertinent negatives include no abdominal pain, congestion, diarrhea, neck pain, rhinorrhea, sinus pain, sneezing, sore throat, vomiting or wheezing. She has tried NSAIDs and acetaminophen for the symptoms.  Fever   This is a new problem. The maximum temperature noted was 100 to 100.9 F. The temperature was taken using an oral thermometer. Associated symptoms include chest pain, coughing, ear pain, headaches and nausea. Pertinent negatives include no abdominal pain, congestion, diarrhea, sore throat, vomiting or wheezing. She has tried acetaminophen and NSAIDs for the symptoms. The treatment provided moderate relief.    ----------------------------------------------------------  Patient is a 60 y/o female with 15 pack year smoking history, history of hemiplegic migraines, history of left sided Bell's palsy, and recent evaluation in the emergency room for chest pain presenting in clinic today saying she feels like she did the last time she was here and had to go to the emergency room.   Patient was seen in the clinic on 12/02/2015 for routine follow up of medication changes when she fainted in the lobby. She was c/o chest pain. In office EKG showed ST depressions and she was emergently transported to [facility]. Workup revealed slightly worsened ST depressions in comparison to 2013 EKG. Ultimately  diagnosed with viral syndrome and atypical chest pain. Given Z-pack and followed up with cardiology.  Patient has been seen at [facility]. She was continued on lipid medications and initiated ASA 81 mg. She is to have stress echo on 01/01/2016 and is to see Dr. Saralyn Pilar for f/u on 01/02/2016.   Today she reports some dizziness but no passing out. She reports a diffuse chest pain that is worse on inspiration. Pertinent positives and negatives are listed above. She is asking for Vicodin for this.   She is also complaining of left sided weakness. She is adamant that this is new within the past month. This is listed as a problem on her problem list since 2013. She was seen in the emergency room in 2013 and had a code stroke called on her for left sided weakness. CT Head and MRI brain were negative for abnormalities. Ultimate diagnosis was hemiplegic migraines, which patient does report a history of today. Patient does also have a history of left sided Bell's palsy. Patient reports she has not seen a neurologist in 10+ years.     Allergies  Allergen Reactions  . Ciprofloxacin Other (See Comments)  . Codeine Hives and Itching    REACTION: Dizziness  . Diazepam Other (See Comments)    Patient states "it makes me crazy" REACTION: Behavioral problems  . Iodinated Diagnostic Agents Other (See Comments)  . Metoprolol   . Morphine Hives  . Niacin Other (See Comments)  . Niacin And Related Other (See Comments)    flushing  . Toradol [Ketorolac Tromethamine] Itching  Tolerates when taken with Benadryl     Current Outpatient Prescriptions:  .  albuterol (PROVENTIL HFA;VENTOLIN HFA) 108 (90 Base) MCG/ACT inhaler, Inhale 2 puffs into the lungs every 6 (six) hours as needed for wheezing or shortness of breath., Disp: 1 Inhaler, Rfl: 2 .  ALPRAZolam (XANAX) 0.5 MG tablet, Take 1 tablet (0.5 mg total) by mouth 3 (three) times daily as needed for anxiety., Disp: 90 tablet, Rfl: 0 .   HYDROcodone-acetaminophen (NORCO/VICODIN) 5-325 MG tablet, Take 1 tablet by mouth every 6 (six) hours as needed. For pain, Disp: 30 tablet, Rfl: 0 .  pantoprazole (PROTONIX) 40 MG tablet, TAKE 1 TABLET (40 MG TOTAL) BY MOUTH DAILY., Disp: 90 tablet, Rfl: 3 .  QUEtiapine (SEROQUEL) 50 MG tablet, Take 1 tablet (50 mg total) by mouth at bedtime., Disp: 30 tablet, Rfl: 1 .  simvastatin (ZOCOR) 40 MG tablet, TAKE 1 TABLET BY MOUTH NIGHTLY AT BEDTIME, Disp: 30 tablet, Rfl: 5 .  meloxicam (MOBIC) 7.5 MG tablet, Take 1 tablet (7.5 mg total) by mouth 2 (two) times daily., Disp: 28 tablet, Rfl: 0  Review of Systems  Constitutional: Negative for activity change, appetite change, chills, diaphoresis, fatigue, fever and unexpected weight change.  HENT: Positive for ear discharge and ear pain. Negative for congestion, dental problem, drooling, postnasal drip, rhinorrhea, sinus pain, sinus pressure, sneezing, sore throat, tinnitus, trouble swallowing and voice change.   Eyes: Negative.   Respiratory: Positive for cough, chest tightness and shortness of breath. Negative for apnea, choking, wheezing and stridor.   Cardiovascular: Positive for chest pain and palpitations. Negative for leg swelling.  Gastrointestinal: Positive for nausea. Negative for abdominal distention, abdominal pain, anal bleeding, blood in stool, constipation, diarrhea, rectal pain and vomiting.  Musculoskeletal: Positive for back pain (lower back pain). Negative for arthralgias, gait problem, joint swelling, myalgias, neck pain and neck stiffness.  Neurological: Positive for weakness (especially her left arm.) and headaches. Negative for dizziness.    Social History  Substance Use Topics  . Smoking status: Current Every Day Smoker    Packs/day: 0.50    Years: 30.00    Types: Cigarettes  . Smokeless tobacco: Never Used  . Alcohol use 0.0 oz/week     Comment: occasional   Objective:   BP 104/70 (BP Location: Left Arm, Patient Position:  Sitting, Cuff Size: Normal)   Pulse 72   Temp 97.7 F (36.5 C) (Oral)   Resp 16   Wt 109 lb (49.4 kg)   BMI 21.29 kg/m   Physical Exam  Constitutional: She is oriented to person, place, and time. She appears well-developed and well-nourished.  HENT:  Head: Normocephalic and atraumatic.  Right Ear: Tympanic membrane normal.  Left Ear: Tympanic membrane normal.  Mouth/Throat: Oropharynx is clear and moist. No oropharyngeal exudate.  Eyes: EOM are normal. Pupils are equal, round, and reactive to light.  Neck: Neck supple.  Cardiovascular: Normal rate, regular rhythm and normal heart sounds.   Pulmonary/Chest: Effort normal and breath sounds normal. No respiratory distress. She has no wheezes. She has no rales.  Chest wall is diffusely tender to palpation.  Abdominal: Soft. Bowel sounds are normal. She exhibits no distension. There is no tenderness. There is no rebound and no guarding.  Lymphadenopathy:    She has no cervical adenopathy.  Neurological: She is alert and oriented to person, place, and time. She has normal reflexes. She displays no seizure activity. Coordination and gait normal. GCS eye subscore is 4. GCS verbal subscore is 5. GCS  motor subscore is 6.  Patient has some left sided weakness of facial muscles. LUE 4/5 strength, LLE 5/5 strength, RUE and LLE 5/5 strength.   Skin: Skin is warm and dry.  Psychiatric:  Patient is anxious.        Assessment & Plan:      Problem List Items Addressed This Visit    None    Visit Diagnoses    Other chest pain    -  Primary   Relevant Medications   meloxicam (MOBIC) 7.5 MG tablet   Other Relevant Orders   DG Chest 2 View   CBC with Differential   Weakness       Relevant Orders   CBC with Differential   Comprehensive Metabolic Panel (CMET)   Ambulatory referral to Neurology     Other Chest Pain  Patient is 60 y/o female presenting with chest pain concerning for costochondritis. This pain has been present for one  month since her last visit here. Had abnormal EKG in office and ED comparable to EKG in 2013. Recent follow up with cardiology. In light of duration and quality of symptoms, likely inflammatory chest pain. Patient is asking for Vicodin, which I will not give today. Wanted to give Toradol shot in office, but patient requires Benadryl and has not brought a driver with her. Patient has been taking 600 mg ibuprofen TID for this without much relief. Will give Mobic as above. Ketoralac is listed as allergy in EPIC, however, patient reports taking ibuprofenwithout issue. Consulted with Dr. Rosanna Randy to see if Mobic is appropriate and he agrees that if she is tolerating ibuprofen, Mobic should be okay. ASA not listed in medications, but upon reading cardiology plan, she was initiated on ASA 81 mg/day. Will call patient to inform her to hold this while taking Mobic. Will get CXR as well.  Weakness  This seems to be long standing according to EMR, though patient reporting onset within past one month. With history of weakness, migraines, and Bell's palsy, will refer to neurology. Will get labs as above.   Return in about 2 days (around 01/02/2016) for chest pain, with Simona Huh .   I have spent 25 minutes with this patient, greater than half of this time was spent on counseling and coordination of care.   The entirety of the information documented in the History of Present Illness, Review of Systems and Physical Exam were personally obtained by me. Portions of this information were initially documented by Ashley Royalty, CMA and reviewed by me for thoroughness and accuracy.    Patient Instructions  Nonspecific Chest Pain Chest pain can be caused by many different conditions. There is a chance that your pain could be related to something serious, such as a heart attack or a blood clot in your lungs. Chest pain can also be caused by conditions that are not life-threatening. If you have chest pain, it is very important to  follow up with your doctor. Follow these instructions at home: Medicines  If you were prescribed an antibiotic medicine, take it as told by your doctor. Do not stop taking the antibiotic even if you start to feel better.  Take over-the-counter and prescription medicines only as told by your doctor. Lifestyle  Do not use any products that contain nicotine or tobacco, such as cigarettes and e-cigarettes. If you need help quitting, ask your doctor.  Do not drink alcohol.  Make lifestyle changes as told by your doctor. These may include:  Getting regular exercise.  Ask your doctor for some activities that are safe for you.  Eating a heart-healthy diet. A diet specialist (dietitian) can help you to learn healthy eating options.  Staying at a healthy weight.  Managing diabetes, if needed.  Lowering your stress, as with deep breathing or spending time in nature. General instructions  Avoid any activities that make you feel chest pain.  If your chest pain is because of heartburn:  Raise (elevate) the head of your bed about 6 inches (15 cm). You can do this by putting blocks under the bed legs at the head of the bed.  Do not sleep with extra pillows under your head. That does not help heartburn.  Keep all follow-up visits as told by your doctor. This is important. This includes any further testing if your chest pain does not go away. Contact a doctor if:  Your chest pain does not go away.  You have a rash with blisters on your chest.  You have a fever.  You have chills. Get help right away if:  Your chest pain is worse.  You have a cough that gets worse, or you cough up blood.  You have very bad (severe) pain in your belly (abdomen).  You are very weak.  You pass out (faint).  You have either of these for no clear reason:  Sudden chest discomfort.  Sudden discomfort in your arms, back, neck, or jaw.  You have shortness of breath at any time.  You suddenly start  to sweat, or your skin gets clammy.  You feel sick to your stomach (nauseous).  You throw up (vomit).  You suddenly feel light-headed or dizzy.  Your heart starts to beat fast, or it feels like it is skipping beats. These symptoms may be an emergency. Do not wait to see if the symptoms will go away. Get medical help right away. Call your local emergency services (911 in the [place] Do not drive yourself to the hospital.  This information is not intended to replace advice given to you by your health care provider. Make sure you discuss any questions you have with your health care provider. Document Released: 07/07/2007 Document Revised: 10/13/2015 Document Reviewed: 10/13/2015 Elsevier Interactive Patient Education  2017 [place]

## 2015-12-31 NOTE — Telephone Encounter (Signed)
[id number] 

## 2015-12-31 NOTE — Telephone Encounter (Signed)
Patient states that she was started on Aspirin 81mg  she was advised to stop during this two week period time while on Mobic. KW

## 2015-12-31 NOTE — Patient Instructions (Signed)

## 2015-12-31 NOTE — Telephone Encounter (Signed)
LMTCB 12/31/2015  Thanks,   -Mickel Baas

## 2016-01-01 ENCOUNTER — Telehealth: Payer: Self-pay

## 2016-01-01 LAB — CBC WITH DIFFERENTIAL/PLATELET
Basophils Absolute: 0 10*3/uL (ref 0.0–0.2)
Basos: 1 %
EOS (ABSOLUTE): 0.3 10*3/uL (ref 0.0–0.4)
Eos: 5 %
Hematocrit: 37.9 % (ref 34.0–46.6)
Hemoglobin: 12.6 g/dL (ref 11.1–15.9)
Immature Grans (Abs): 0 10*3/uL (ref 0.0–0.1)
Immature Granulocytes: 0 %
Lymphocytes Absolute: 2.1 10*3/uL (ref 0.7–3.1)
Lymphs: 40 %
MCH: 31 pg (ref 26.6–33.0)
MCHC: 33.2 g/dL (ref 31.5–35.7)
MCV: 93 fL (ref 79–97)
Monocytes Absolute: 0.5 10*3/uL (ref 0.1–0.9)
Monocytes: 9 %
Neutrophils Absolute: 2.4 10*3/uL (ref 1.4–7.0)
Neutrophils: 45 %
Platelets: 240 10*3/uL (ref 150–379)
RBC: 4.07 x10E6/uL (ref 3.77–5.28)
RDW: 14.2 % (ref 12.3–15.4)
WBC: 5.4 10*3/uL (ref 3.4–10.8)

## 2016-01-01 LAB — COMPREHENSIVE METABOLIC PANEL
ALT: 8 IU/L (ref 0–32)
AST: 12 IU/L (ref 0–40)
Albumin/Globulin Ratio: 2.1 (ref 1.2–2.2)
Albumin: 4.2 g/dL (ref 3.5–5.5)
Alkaline Phosphatase: 70 IU/L (ref 39–117)
BUN/Creatinine Ratio: 18 (ref 9–23)
BUN: 14 mg/dL (ref 6–24)
Bilirubin Total: 0.3 mg/dL (ref 0.0–1.2)
CO2: 23 mmol/L (ref 18–29)
Calcium: 8.9 mg/dL (ref 8.7–10.2)
Chloride: 108 mmol/L — ABNORMAL HIGH (ref 96–106)
Creatinine, Ser: 0.77 mg/dL (ref 0.57–1.00)
GFR calc Af Amer: 98 mL/min/{1.73_m2} (ref >59)
GFR calc non Af Amer: 85 mL/min/{1.73_m2} (ref >59)
Globulin, Total: 2 g/dL (ref 1.5–4.5)
Glucose: 86 mg/dL (ref 65–99)
Potassium: 3.9 mmol/L (ref 3.5–5.2)
Sodium: 146 mmol/L — ABNORMAL HIGH (ref 134–144)
Total Protein: 6.2 g/dL (ref 6.0–8.5)

## 2016-01-01 NOTE — Telephone Encounter (Signed)
Patient has been advised. KW 

## 2016-01-01 NOTE — Telephone Encounter (Signed)
-----   Message from Trinna Post, [place] sent at 01/01/2016  8:15 AM EST ----- CBC and CMP normal. Please keep f/u appointment with Simona Huh and continue with pain management and appointments with cardiology/neurology. Thanks.

## 2016-01-01 NOTE — Telephone Encounter (Signed)
[id number] 

## 2016-01-02 ENCOUNTER — Ambulatory Visit (INDEPENDENT_AMBULATORY_CARE_PROVIDER_SITE_OTHER): Payer: 59 | Admitting: Family Medicine

## 2016-01-02 ENCOUNTER — Encounter: Payer: Self-pay | Admitting: Family Medicine

## 2016-01-02 VITALS — BP 122/74 | HR 84 | Temp 98.2°F | Resp 16 | Wt 110.4 lb

## 2016-01-02 DIAGNOSIS — M940 Chondrocostal junction syndrome [Tietze]: Secondary | ICD-10-CM | POA: Diagnosis not present

## 2016-01-02 MED ORDER — HYDROCODONE-ACETAMINOPHEN 5-325 MG PO TABS: 1.0000 | ORAL_TABLET | Freq: Four times a day (QID) | ORAL | 0 refills | Status: DC | PRN

## 2016-01-02 NOTE — Progress Notes (Signed)
[name] Female    DOB: Nov 03, 1955   60 y.o.   MRN: [id number] Visit Date: 01/02/2016  Today's Provider: Vernie Murders, PA   Chief Complaint  Patient presents with  . Follow-up   Subjective:    HPI Patient is here for a 2 day follow up. She was here on 12/31/2015 and seen Adriana for chest pain. Patient was prescribed Meloxicam 7.5 mg, labs were ordered which were normal and CXR also normal. Patient was advised to follow up with cardiologist. Patient reports good compliance with treatment plan.  Patient had a follow up appointment with cardiologist today and a stress test. Per patient stress test is normal. Cardiologist did add Metoprolol 25 mg.   Patient Active Problem List   Diagnosis Date Noted  . 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 Acute onset aura migraine 07/10/2014  . Bad memory 07/10/2014  . Migraine without aura 07/10/2014  . Abdominal pain, epigastric 07/10/2014  . Chills with fever 07/10/2014  . Chronic constipation 07/10/2014  . Migraines   . Migraine 11/28/2011  . Weakness of left side of body 11/28/2011  . Acute anxiety 11/20/2009  . DEPRESSION 11/20/2009  . FOOT PAIN 11/20/2009  . Hypercholesterolemia without hypertriglyceridemia 06/18/2009  . 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 Malaise and fatigue 10/29/2008  . Abnormal loss of weight 10/29/2008  . 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 Affective bipolar disorder ([place]) 05/17/2008  . Barrett esophagus 05/17/2008   Past Surgical History:  Procedure Laterality Date  . ABDOMINAL HYSTERECTOMY    . CHOLECYSTECTOMY     Family History  Problem Relation Age of Onset  . Migraines Sister    Allergies  Allergen Reactions  . Ciprofloxacin Other (See Comments)  . Codeine Hives and Itching    REACTION: Dizziness  . Diazepam Other (See Comments)    Patient states "it makes me crazy" REACTION: Behavioral problems  .  Iodinated Diagnostic Agents Other (See Comments)  . Metoprolol   . Morphine Hives  . Niacin Other (See Comments)  . Niacin And Related Other (See Comments)    flushing  . Toradol [Ketorolac Tromethamine] Itching    Tolerates when taken with Benadryl     Previous Medications   ALBUTEROL (PROVENTIL HFA;VENTOLIN HFA) 108 (90 BASE) MCG/ACT INHALER    Inhale 2 puffs into the lungs every 6 (six) hours as needed for wheezing or shortness of breath.   ALPRAZOLAM (XANAX) 0.5 MG TABLET    Take 1 tablet (0.5 mg total) by mouth 3 (three) times daily as needed for anxiety.   HYDROCODONE-ACETAMINOPHEN (NORCO/VICODIN) 5-325 MG TABLET    Take 1 tablet by mouth every 6 (six) hours as needed. For pain   MELOXICAM (MOBIC) 7.5 MG TABLET    Take 1 tablet (7.5 mg total) by mouth 2 (two) times daily.   METOPROLOL TARTRATE (LOPRESSOR) 25 MG TABLET    Take by mouth.   PANTOPRAZOLE (PROTONIX) 40 MG TABLET    TAKE 1 TABLET (40 MG TOTAL) BY MOUTH DAILY.   QUETIAPINE (SEROQUEL) 50 MG TABLET    Take 1 tablet (50 mg total) by mouth at bedtime.   SIMVASTATIN (ZOCOR) 40 MG TABLET    TAKE 1 TABLET BY MOUTH NIGHTLY AT BEDTIME    Review of Systems  Constitutional: Negative.   Respiratory: Negative.   Cardiovascular: Positive for chest pain.    Social History  Substance Use Topics  . Smoking status: Current Every Day  Smoker    Packs/day: 0.50    Years: 30.00    Types: Cigarettes  . Smokeless tobacco: Never Used  . Alcohol use 0.0 oz/week     Comment: occasional   Objective:   BP 122/74 (BP Location: Right Arm, Patient Position: Sitting, Cuff Size: Normal)   Pulse 84   Temp 98.2 F (36.8 C) (Oral)   Resp 16   Wt 110 lb 6.4 oz (50.1 kg)   BMI 21.56 kg/m   Physical Exam  Constitutional: She is oriented to person, place, and time. She appears well-developed and well-nourished. No distress.  HENT:  Head: Normocephalic and atraumatic.  Right Ear: Hearing normal.  Left Ear: Hearing normal.  Nose: Nose  normal.  Eyes: Conjunctivae and lids are normal. Right eye exhibits no discharge. Left eye exhibits no discharge. No scleral icterus.  Neck: Neck supple.  Cardiovascular: Normal rate and regular rhythm.   Pulmonary/Chest: Effort normal and breath sounds normal. No respiratory distress. She exhibits tenderness.  Very tender in all ribs and sternal borders to palpate or compress.   Abdominal: Soft. Bowel sounds are normal.  Musculoskeletal: Normal range of motion.  Neurological: She is alert and oriented to person, place, and time.  Skin: Skin is intact. No lesion and no rash noted.  Psychiatric: She has a normal mood and affect. Her speech is normal and behavior is normal. Thought content normal.      Assessment & Plan:     1. Costochondritis Persistent chest pains to palpate all ribs and sternum. Cardiologist did a stress test today without significant findings. No fever, cough or congestion today. CXR on 12-31-15 was negative for cardiopulmonary or bony abnormalities. Apply moist heat to chest and ribs. Refilled Norco for pain and will get CRP. May need prednisone taper for inflammation if CRP positive. If she returns to work, should limit lifting to 4-5 lbs and only intermittently. - C-reactive protein - HYDROcodone-acetaminophen (NORCO/VICODIN) 5-325 MG tablet; Take 1 tablet by mouth every 6 (six) hours as needed. For pain  Dispense: 30 tablet; Refill: 0

## 2016-01-03 LAB — C-REACTIVE PROTEIN

## 2016-01-05 ENCOUNTER — Telehealth: Payer: Self-pay

## 2016-01-05 MED ORDER — PREDNISONE 5 MG (21) PO TBPK: ORAL_TABLET | ORAL | 0 refills | Status: DC

## 2016-01-05 NOTE — Telephone Encounter (Signed)
Patient advised. RX sent to pharmacy. Patient will call back to reschedule appointment if needed.

## 2016-01-05 NOTE — Telephone Encounter (Signed)
-----   Message from Margo Common, [place] sent at 01/05/2016  9:56 AM EST ----- Blood test is normal. Recommend short 6 day prednisone taper 5 mg (6,5,4,3,2,1) #21. Recheck in 1 week if this plus heating pad and pain medication does not clear this.

## 2016-01-09 ENCOUNTER — Ambulatory Visit: Payer: 59 | Admitting: Family Medicine

## 2016-01-13 ENCOUNTER — Ambulatory Visit (INDEPENDENT_AMBULATORY_CARE_PROVIDER_SITE_OTHER): Payer: 59 | Admitting: Surgery

## 2016-01-13 ENCOUNTER — Encounter: Payer: Self-pay | Admitting: Surgery

## 2016-01-13 VITALS — BP 128/65 | HR 50 | Temp 98.8°F | Ht 60.0 in | Wt 111.0 lb

## 2016-01-13 DIAGNOSIS — D1724 Benign lipomatous neoplasm of skin and subcutaneous tissue of left leg: Secondary | ICD-10-CM

## 2016-01-13 NOTE — Patient Instructions (Signed)
We will see you back in 3 months as scheduled below.

## 2016-01-13 NOTE — Progress Notes (Signed)
Surgical Consultation  01/13/2016  Jordan Ortega is an 60 y.o. female.   CC: Lump on leg  HPI: Left leg mass posterior. It is been present for many years but over the last few weeks is been growing and painful. She also describes numbness down the back of her leg when she's been sitting for long periods of time. Patient is a smoker and works at [employer]. Her father had a vascular procedure recently and colon cancer.  Past Medical History:  Diagnosis Date  . Bell palsy   . Migraines     Past Surgical History:  Procedure Laterality Date  . ABDOMINAL HYSTERECTOMY    . CHOLECYSTECTOMY    . [place]  . TUBAL LIGATION  1983    Family History  Problem Relation Age of Onset  . Breast cancer Mother   . Migraines Sister   . Colon cancer Father   . Lung cancer Father   . Diabetes Paternal Grandmother   . Hypertension Paternal Grandmother   . Stroke Paternal Grandmother   . Dementia Paternal Grandfather     Social History:  reports that she has been smoking Cigarettes.  She has a 15.00 pack-year smoking history. She has never used smokeless tobacco. She reports that she drinks alcohol. She reports that she does not use drugs.  Allergies:  Allergies  Allergen Reactions  . Ciprofloxacin Other (See Comments)  . Codeine Hives and Itching    REACTION: Dizziness  . Diazepam Other (See Comments)    Patient states "it makes me crazy" REACTION: Behavioral problems  . Iodinated Diagnostic Agents Other (See Comments)  . Metoprolol   . Morphine Hives  . Niacin Other (See Comments)  . Niacin And Related Other (See Comments)    flushing  . Toradol [Ketorolac Tromethamine] Itching    Tolerates when taken with Benadryl    Medications reviewed.   Review of Systems:   Review of Systems  Constitutional: Negative.   HENT: Negative.   Eyes: Negative.   Respiratory: Negative.   Cardiovascular: Negative.   Gastrointestinal: Negative.   Genitourinary: Negative.    Musculoskeletal: Negative.   Skin: Negative.   Neurological: Negative.   Endo/Heme/Allergies: Negative.   Psychiatric/Behavioral: Negative.      Physical Exam:  BP 128/65   Pulse (!) 50   Temp 98.8 F (37.1 C) (Oral)   Ht 5' (1.524 m)   Wt 111 lb (50.3 kg)   BMI 21.68 kg/m   Physical Exam  Constitutional: She is well-developed, well-nourished, and in no distress. No distress.  HENT:  Head: Normocephalic and atraumatic.  Eyes: Right eye exhibits no discharge. Left eye exhibits no discharge. No scleral icterus.  Neck: Normal range of motion.  Cardiovascular: Normal rate and regular rhythm.   Pulmonary/Chest: Effort normal. No respiratory distress.  Musculoskeletal: Normal range of motion. She exhibits no edema or tenderness.  2 cm lipoma of the posterior left thigh near the buttock crease, nontender non-erythematous superficial  Lymphadenopathy:    She has no cervical adenopathy.  Skin: Skin is warm and dry. No rash noted. She is not diaphoretic. No erythema.  Vitals reviewed.     No results found for this or any previous visit (from the past 48 hour(s)). No results found.  Assessment/Plan:  Small lipoma of the left posterior thigh near the buttock crease. I discussed with the patient the potential for this being something other than a lipoma and that an MRI could be ordered if necessary or if there  was rapid growth. I would like to see her back in 3 months or sooner if she thinks it's growing rapidly.  I believe that her symptoms of pain down the posterior thigh radiating to the lateral side of the left foot is suggestive more of sciatic pain and sciatic nerve dysfunction. I discussed with her some of the options she prefers to see a chiropractor for this which may be very valuable and mitigating some of her symptoms.  Florene Glen, MD, FACS

## 2016-01-15 ENCOUNTER — Ambulatory Visit: Payer: 59 | Admitting: Family Medicine

## 2016-01-16 ENCOUNTER — Ambulatory Visit (INDEPENDENT_AMBULATORY_CARE_PROVIDER_SITE_OTHER): Payer: 59 | Admitting: Family Medicine

## 2016-01-16 ENCOUNTER — Encounter: Payer: Self-pay | Admitting: Family Medicine

## 2016-01-16 VITALS — BP 102/62 | HR 49 | Temp 97.9°F | Resp 14 | Wt 109.0 lb

## 2016-01-16 DIAGNOSIS — R079 Chest pain, unspecified: Secondary | ICD-10-CM

## 2016-01-16 DIAGNOSIS — M940 Chondrocostal junction syndrome [Tietze]: Secondary | ICD-10-CM

## 2016-01-16 DIAGNOSIS — R29898 Other symptoms and signs involving the musculoskeletal system: Secondary | ICD-10-CM | POA: Diagnosis not present

## 2016-01-16 NOTE — Progress Notes (Signed)
[name] Female    DOB: [birth year]   60 y.o.   MRN: [id number] Visit Date: 01/16/2016  Today's Provider: Vernie Murders, PA   Chief Complaint  Patient presents with  . Chest Pain  . Follow-up   Subjective:    HPI  Costochondritis:   Patient is here for a 2 week follow up. Last OV was 01/02/2016 she started a Prednisone taper and advised to use a heating pad. Patient reports good compliance with treatment plan. She states symptoms have slightly improved, but are unchanged at night. She has to take Hydrocodone at night time for relief.   Patient Active Problem List   Diagnosis Date Noted  . 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 Acute onset aura migraine 07/10/2014  . Bad memory 07/10/2014  . Migraine without aura 07/10/2014  . Abdominal pain, epigastric 07/10/2014  . Chills with fever 07/10/2014  . Chronic constipation 07/10/2014  . Migraines   . Migraine 11/28/2011  . Weakness of left side of body 11/28/2011  . Acute anxiety 11/20/2009  . DEPRESSION 11/20/2009  . FOOT PAIN 11/20/2009  . Hypercholesterolemia without hypertriglyceridemia 06/18/2009  . 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 Malaise and fatigue 10/29/2008  . Abnormal loss of weight 10/29/2008  . 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 Affective bipolar disorder ([place]) 05/17/2008  . Barrett esophagus 05/17/2008   Past Surgical History:  Procedure Laterality Date  . ABDOMINAL HYSTERECTOMY    . CHOLECYSTECTOMY    . [place]  . TUBAL LIGATION  1983   Family History  Problem Relation Age of Onset  . Breast cancer Mother   . Migraines Sister   . Colon cancer Father   . Lung cancer Father   . Diabetes Paternal Grandmother   . Hypertension Paternal Grandmother   . Stroke Paternal Grandmother   . Dementia Paternal Grandfather    Allergies  Allergen Reactions  . Ciprofloxacin Other (See Comments)  .  Codeine Hives and Itching    REACTION: Dizziness  . Diazepam Other (See Comments)    Patient states "it makes me crazy" REACTION: Behavioral problems  . Iodinated Diagnostic Agents Other (See Comments)  . Metoprolol   . Morphine Hives  . Niacin Other (See Comments)  . Niacin And Related Other (See Comments)    flushing  . Toradol [Ketorolac Tromethamine] Itching    Tolerates when taken with Benadryl     Previous Medications   ALBUTEROL (PROVENTIL HFA;VENTOLIN HFA) 108 (90 BASE) MCG/ACT INHALER    Inhale 2 puffs into the lungs every 6 (six) hours as needed for wheezing or shortness of breath.   ALPRAZOLAM (XANAX) 0.5 MG TABLET    Take 1 tablet (0.5 mg total) by mouth 3 (three) times daily as needed for anxiety.   GABAPENTIN (NEURONTIN) 300 MG CAPSULE    Take by mouth.   HYDROCODONE-ACETAMINOPHEN (NORCO/VICODIN) 5-325 MG TABLET    Take 1 tablet by mouth every 6 (six) hours as needed. For pain   METOPROLOL TARTRATE (LOPRESSOR) 25 MG TABLET    Take by mouth.   PANTOPRAZOLE (PROTONIX) 40 MG TABLET    TAKE 1 TABLET (40 MG TOTAL) BY MOUTH DAILY.   QUETIAPINE (SEROQUEL) 50 MG TABLET    Take 1 tablet (50 mg total) by mouth at bedtime.   SIMVASTATIN (ZOCOR) 40 MG TABLET    TAKE 1 TABLET BY MOUTH NIGHTLY AT BEDTIME    Review of Systems  Constitutional: Negative.   Respiratory: Negative.   Cardiovascular: Positive for chest pain.    Social History  Substance Use Topics  . Smoking status: Current Every Day Smoker    Packs/day: 0.50    Years: 30.00    Types: Cigarettes  . Smokeless tobacco: Never Used  . Alcohol use 0.0 oz/week     Comment: occasional   Objective:   BP 102/62 (BP Location: Right Arm, Patient Position: Sitting, Cuff Size: Normal)   Pulse (!) 49   Temp 97.9 F (36.6 C) (Oral)   Resp 14   Wt 109 lb (49.4 kg)   BMI 21.29 kg/m   Physical Exam  Constitutional: She is oriented to person, place, and time. She appears well-developed and well-nourished.  HENT:  Head:  Normocephalic.  Eyes: Conjunctivae are normal.  Neck: Neck supple.  Cardiovascular: Normal rate.   Pulmonary/Chest: Effort normal and breath sounds normal. No respiratory distress. She has no wheezes. She exhibits tenderness.  Still very sore in chest along sternal borders.   Abdominal: Soft. Bowel sounds are normal.  Musculoskeletal: Normal range of motion.  Lymphadenopathy:    She has no cervical adenopathy.  Neurological: She is alert and oriented to person, place, and time.  Unchanged decreased strength in the left arm. Under evaluation by Dr. Manuella Ghazi. Increased pain in chest with lifting strength test. Strength 4/5 in the left upper arm. 5/5 in other extremities.      Assessment & Plan:     1. Costochondritis Slowly improved with use of Mobic and Norco since 12-02-15. Recommend moist heat applications and may return to work with 10-12 lb infrequent lifting limit as of 01-19-16. No repetitive lifting. Recheck in 2 weeks if needed.   2. Chest pain with high risk for cardiac etiology Evaluation by Dr. Saralyn Pilar (cardiologist) showed normal stress tests on 01-01-16. EKG's showed non-specific ST abnormalities inferolaterally. With family history of cardiac disease and her hyperlipidemia type IV, she was started on Metoprolol 25 mg BID and ASA 81 mg qd. Will follow up with cardiologist to be sure heart rate and BP not lowered too much.  3. Left arm weakness No known injury. Followed by Dr. Manuella Ghazi (neurologist - next appointment 01-23-16 for EMG). To be scheduled for MRI of neck and nerve conduction studies. Presently treated with Gabapentin 300 mg HS.

## 2016-01-20 ENCOUNTER — Telehealth: Payer: Self-pay | Admitting: Family Medicine

## 2016-01-20 NOTE — Telephone Encounter (Signed)
Pt stated that she saw Dr. Manuella Ghazi and he had done some labs on pt and advised pt to contact our office to get Jordan Ortega' advise about her protein levels being so low. Pt is going to get a copy of lab results from their office and drop them off tomorrow. Pt would like a call back and was advised that Jordan Ortega is out of the office on Wednesdays. Thanks TNP

## 2016-01-25 ENCOUNTER — Other Ambulatory Visit: Payer: Self-pay | Admitting: Family Medicine

## 2016-01-25 DIAGNOSIS — G43009 Migraine without aura, not intractable, without status migrainosus: Secondary | ICD-10-CM

## 2016-01-25 DIAGNOSIS — F39 Unspecified mood [affective] disorder: Secondary | ICD-10-CM

## 2016-01-27 ENCOUNTER — Telehealth: Payer: Self-pay | Admitting: Family Medicine

## 2016-01-27 NOTE — Telephone Encounter (Signed)
Patient was advised as directed below.  Thanks,  -Joseline 

## 2016-01-27 NOTE — Telephone Encounter (Signed)
All the labs I see seem normal. Being that I have not seen her I cannot say exactly what Dr. Manuella Ghazi would want Jordan Ortega to be looking for, but from my evaluation there is nothing concerning.

## 2016-01-27 NOTE — Telephone Encounter (Signed)
Pt called saying Dr. Manuella Ghazi ordered some labs and then had them sent to [facility] to see.  Dr,. Manuella Ghazi said Simona Huh needed to be aware of some protein levels. Pt is concerned and understand Simona Huh is not here this week but would like to hear back from [place] to make sure it is nothing to be concerned about.  Please call her back at [phone] or [phone]  Thanks, Con Memos

## 2016-01-28 ENCOUNTER — Encounter: Payer: Self-pay | Admitting: Physician Assistant

## 2016-01-28 ENCOUNTER — Ambulatory Visit (INDEPENDENT_AMBULATORY_CARE_PROVIDER_SITE_OTHER): Payer: 59 | Admitting: Physician Assistant

## 2016-01-28 VITALS — BP 100/52 | HR 62 | Temp 98.2°F | Resp 16 | Wt 107.0 lb

## 2016-01-28 DIAGNOSIS — Z720 Tobacco use: Secondary | ICD-10-CM

## 2016-01-28 DIAGNOSIS — J069 Acute upper respiratory infection, unspecified: Secondary | ICD-10-CM | POA: Diagnosis not present

## 2016-01-28 MED ORDER — NICOTINE 14 MG/24HR TD PT24: 14.0000 mg | MEDICATED_PATCH | Freq: Every day | TRANSDERMAL | 0 refills | Status: AC

## 2016-01-28 MED ORDER — NICOTINE 21 MG/24HR TD PT24: 21.0000 mg | MEDICATED_PATCH | Freq: Every day | TRANSDERMAL | 0 refills | Status: DC

## 2016-01-28 NOTE — Patient Instructions (Signed)
Upper Respiratory Infection, Adult Most upper respiratory infections (URIs) are caused by a virus. A URI affects the nose, throat, and upper air passages. The most common type of URI is often called "the common cold." Follow these instructions at home:  Take medicines only as told by your doctor.  Gargle warm saltwater or take cough drops to comfort your throat as told by your doctor.  Use a warm mist humidifier or inhale steam from a shower to increase air moisture. This may make it easier to breathe.  Drink enough fluid to keep your pee (urine) clear or pale yellow.  Eat soups and other clear broths.  Have a healthy diet.  Rest as needed.  Go back to work when your fever is gone or your doctor says it is okay.  You may need to stay home longer to avoid giving your URI to others.  You can also wear a face mask and wash your hands often to prevent spread of the virus.  Use your inhaler more if you have asthma.  Do not use any tobacco products, including cigarettes, chewing tobacco, or electronic cigarettes. If you need help quitting, ask your doctor. Contact a doctor if:  You are getting worse, not better.  Your symptoms are not helped by medicine.  You have chills.  You are getting more short of breath.  You have brown or red mucus.  You have yellow or brown discharge from your nose.  You have pain in your face, especially when you bend forward.  You have a fever.  You have puffy (swollen) neck glands.  You have pain while swallowing.  You have white areas in the back of your throat. Get help right away if:  You have very bad or constant:  Headache.  Ear pain.  Pain in your forehead, behind your eyes, and over your cheekbones (sinus pain).  Chest pain.  You have long-lasting (chronic) lung disease and any of the following:  Wheezing.  Long-lasting cough.  Coughing up blood.  A change in your usual mucus.  You have a stiff neck.  You have  changes in your:  Vision.  Hearing.  Thinking.  Mood. This information is not intended to replace advice given to you by your health care provider. Make sure you discuss any questions you have with your health care provider. Document Released: 07/07/2007 Document Revised: 09/21/2015 Document Reviewed: 04/25/2013 Elsevier Interactive Patient Education  2017 Elsevier Inc.  

## 2016-01-28 NOTE — Progress Notes (Signed)
[name] Female    DOB: May 17, 1955   59 y.o.   MRN: [id number] Visit Date: 01/28/2016  Today's Provider: Trinna Post, PA-C   Chief Complaint  Patient presents with  . Cough   Subjective:    HPI Pt w/ history of chest pain and costochondritis w/ normal stress echo and EKG at [facility], currently smoking one pack per day who  is here for cough and congestion. She reports that she started feeling and 2 days ago then last night she started coughing. She also reports that she had a fever of 102 last night, chills and body aches. No nausea or vomiting. She also has chest pain and when she coughs it feels like her chest is on fire. No SOB. She feels like she can not get warm. She reports that she thinks she had a fever this morning too because her cheeks get red when she has a fever.     Allergies  Allergen Reactions  . Ciprofloxacin Other (See Comments)  . Codeine Hives and Itching    REACTION: Dizziness  . Diazepam Other (See Comments)    Patient states "it makes me crazy" REACTION: Behavioral problems  . Iodinated Diagnostic Agents Other (See Comments)  . Metoprolol   . Morphine Hives  . Niacin Other (See Comments)  . Niacin And Related Other (See Comments)    flushing  . Toradol [Ketorolac Tromethamine] Itching    Tolerates when taken with Benadryl     Current Outpatient Prescriptions:  .  albuterol (PROVENTIL HFA;VENTOLIN HFA) 108 (90 Base) MCG/ACT inhaler, Inhale 2 puffs into the lungs every 6 (six) hours as needed for wheezing or shortness of breath., Disp: 1 Inhaler, Rfl: 2 .  ALPRAZolam (XANAX) 0.5 MG tablet, Take 1 tablet (0.5 mg total) by mouth 3 (three) times daily as needed for anxiety., Disp: 90 tablet, Rfl: 0 .  gabapentin (NEURONTIN) 300 MG capsule, Take by mouth., Disp: , Rfl:  .  HYDROcodone-acetaminophen (NORCO/VICODIN) 5-325 MG tablet, Take 1 tablet by mouth every 6 (six) hours as needed. For pain, Disp: 30 tablet, Rfl: 0 .  metoprolol  tartrate (LOPRESSOR) 25 MG tablet, Take by mouth., Disp: , Rfl:  .  pantoprazole (PROTONIX) 40 MG tablet, TAKE 1 TABLET (40 MG TOTAL) BY MOUTH DAILY., Disp: 90 tablet, Rfl: 3 .  QUEtiapine (SEROQUEL) 50 MG tablet, TAKE 1 TABLET BY MOUTH AT BEDTIME, Disp: 30 tablet, Rfl: 1 .  simvastatin (ZOCOR) 40 MG tablet, TAKE 1 TABLET BY MOUTH NIGHTLY AT BEDTIME, Disp: 30 tablet, Rfl: 5 .  nicotine (NICODERM CQ) 14 mg/24hr patch, Place 1 patch (14 mg total) onto the skin daily., Disp: 42 patch, Rfl: 0  Review of Systems  Constitutional: Positive for chills and fever.  HENT: Positive for congestion and rhinorrhea.   Eyes: Negative.   Respiratory: Positive for cough.   Cardiovascular: Positive for chest pain.  Gastrointestinal: Negative.   Endocrine: Negative.   Genitourinary: Negative.   Musculoskeletal: Negative.   Skin: Negative.   Allergic/Immunologic: Negative.   Neurological: Negative.   Hematological: Negative.   Psychiatric/Behavioral: Negative.     Social History  Substance Use Topics  . Smoking status: Current Every Day Smoker    Packs/day: 0.50    Years: 30.00    Types: Cigarettes  . Smokeless tobacco: Never Used  . Alcohol use 0.0 oz/week     Comment: occasional   Objective:   BP (!) 100/52 (BP Location: Left Arm, Patient Position:  Sitting, Cuff Size: Normal)   Pulse 62   Temp 98.2 F (36.8 C) (Oral)   Resp 16   Wt 107 lb (48.5 kg)   SpO2 99%   BMI 20.90 kg/m   Physical Exam  Constitutional: She is oriented to person, place, and time.  HENT:  Right Ear: External ear normal.  Left Ear: External ear normal.  Mouth/Throat: Oropharynx is clear and moist. No oropharyngeal exudate.  Eyes: Conjunctivae are normal.  Neck: Neck supple.  Cardiovascular: Normal rate, regular rhythm and normal heart sounds.   Pulmonary/Chest: Effort normal and breath sounds normal.  Lymphadenopathy:    She has cervical adenopathy.  Neurological: She is alert and oriented to person, place, and  time.  Skin: Skin is warm and dry.  Psychiatric: She has a normal mood and affect. Her behavior is normal.        Assessment & Plan:     1. Tobacco abuse  Patient continues to smoke one pack a day. Has tried lozenges but they made her nauseas. Will send as prescription so hopefully this will be cheaper. One patch per day for 6 weeks and taper down.  - nicotine (NICODERM CQ) 14 mg/24hr patch; Place 1 patch (14 mg total) onto the skin daily.  Dispense: 42 patch; Refill: 0  2. Upper respiratory tract infection, unspecified type  Likely viral at this point. No SOB or increased sputum production. Treat symptomatically until now, patient may call back if not improving. Work note provided.   Return in about 1 week (around 02/04/2016) for lifting restriction, smoking cessation.  Patient Instructions  Upper Respiratory Infection, Adult Most upper respiratory infections (URIs) are caused by a virus. A URI affects the nose, throat, and upper air passages. The most common type of URI is often called "the common cold." Follow these instructions at home:  Take medicines only as told by your doctor.  Gargle warm saltwater or take cough drops to comfort your throat as told by your doctor.  Use a warm mist humidifier or inhale steam from a shower to increase air moisture. This may make it easier to breathe.  Drink enough fluid to keep your pee (urine) clear or pale yellow.  Eat soups and other clear broths.  Have a healthy diet.  Rest as needed.  Go back to work when your fever is gone or your doctor says it is okay.  You may need to stay home longer to avoid giving your URI to others.  You can also wear a face mask and wash your hands often to prevent spread of the virus.  Use your inhaler more if you have asthma.  Do not use any tobacco products, including cigarettes, chewing tobacco, or electronic cigarettes. If you need help quitting, ask your doctor. Contact a doctor if:  You are  getting worse, not better.  Your symptoms are not helped by medicine.  You have chills.  You are getting more short of breath.  You have brown or red mucus.  You have yellow or brown discharge from your nose.  You have pain in your face, especially when you bend forward.  You have a fever.  You have puffy (swollen) neck glands.  You have pain while swallowing.  You have white areas in the back of your throat. Get help right away if:  You have very bad or constant:  Headache.  Ear pain.  Pain in your forehead, behind your eyes, and over your cheekbones (sinus pain).  Chest pain.  You have long-lasting (chronic) lung disease and any of the following:  Wheezing.  Long-lasting cough.  Coughing up blood.  A change in your usual mucus.  You have a stiff neck.  You have changes in your:  Vision.  Hearing.  Thinking.  Mood. This information is not intended to replace advice given to you by your health care provider. Make sure you discuss any questions you have with your health care provider. Document Released: 07/07/2007 Document Revised: 09/21/2015 Document Reviewed: 04/25/2013 Elsevier Interactive Patient Education  2017 [employer].    The entirety of the information documented in the History of Present Illness, Review of Systems and Physical Exam were personally obtained by me. Portions of this information were initially documented by [place] and reviewed by me for thoroughness and accuracy.             Trinna Post, PA-C  [facility]

## 2016-02-02 ENCOUNTER — Emergency Department: Payer: 59

## 2016-02-02 ENCOUNTER — Emergency Department
Admission: EM | Admit: 2016-02-02 | Discharge: 2016-02-02 | Disposition: A | Discharge status: Home / Self Care | Payer: 59 | Attending: Emergency Medicine | Admitting: Emergency Medicine

## 2016-02-02 ENCOUNTER — Encounter: Payer: Self-pay | Admitting: Emergency Medicine

## 2016-02-02 DIAGNOSIS — Z79899 Other long term (current) drug therapy: Secondary | ICD-10-CM | POA: Insufficient documentation

## 2016-02-02 DIAGNOSIS — J209 Acute bronchitis, unspecified: Secondary | ICD-10-CM | POA: Diagnosis not present

## 2016-02-02 DIAGNOSIS — R05 Cough: Secondary | ICD-10-CM | POA: Diagnosis present

## 2016-02-02 DIAGNOSIS — F17210 Nicotine dependence, cigarettes, uncomplicated: Secondary | ICD-10-CM | POA: Diagnosis not present

## 2016-02-02 MED ORDER — PREDNISONE 10 MG PO TABS: ORAL_TABLET | ORAL | 0 refills | Status: DC

## 2016-02-02 MED ORDER — AZITHROMYCIN 250 MG PO TABS: ORAL_TABLET | ORAL | 0 refills | Status: DC

## 2016-02-02 MED ORDER — IPRATROPIUM-ALBUTEROL 0.5-2.5 (3) MG/3ML IN SOLN
3.0000 mL | Freq: Once | RESPIRATORY_TRACT | Status: AC
  Administered 2016-02-02: 3 mL via RESPIRATORY_TRACT
  Filled 2016-02-02: qty 3

## 2016-02-02 MED ORDER — HYDROCOD POLST-CPM POLST ER 10-8 MG/5ML PO SUER: 5.0000 mL | Freq: Two times a day (BID) | ORAL | 0 refills | Status: DC | PRN

## 2016-02-02 NOTE — Discharge Instructions (Signed)
Begin using albuterol inhaler 4 times a day to help prevent bronchospasms. Tylenol or ibuprofen as needed for body aches, headaches or fever. Zithromax as directed, prednisone 60 mg beginning today and taper over the next 6 days. Tussionex  1 teaspoon every 12 hours if needed for cough.  Be aware that this is a narcotic and you can not take this medication and drive.   Follow-up with your primary care doctor if any continued problems.

## 2016-02-02 NOTE — ED Triage Notes (Signed)
C/o cough and congestion x 1 wk. No resp distress at this time

## 2016-02-02 NOTE — ED Provider Notes (Signed)
[facility] Emergency Department Provider Note   ____________________________________________   First MD Initiated Contact with Patient 02/02/16 1734     (approximate)  I have reviewed the triage vital signs and the nursing notes.   HISTORY  Chief Complaint Cough   HPI Analleli Coven is a 61 y.o. female is here with complaint of cough and congestion for 1 week.Patient states that earlier today she was coughing and unable to get her breath. She was seen by her PCP last week and told that she did not have the flu. She is continued to gradually get worse. Patient is unaware of any fever or chills. There's been no nausea or vomiting. Appetite has decreased.  Patient is a smoker and has decreased the amount of smoking that she is doing at this time. She states that she does have an inhaler at home but has not used it.   Past Medical History:  Diagnosis Date  . Bell palsy   . Migraines     Patient Active Problem List   Diagnosis Date Noted  . 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 Acute onset aura migraine 07/10/2014  . Bad memory 07/10/2014  . Migraine without aura 07/10/2014  . Abdominal pain, epigastric 07/10/2014  . Chills with fever 07/10/2014  . Chronic constipation 07/10/2014  . Migraines   . Migraine 11/28/2011  . Weakness of left side of body 11/28/2011  . Acute anxiety 11/20/2009  . DEPRESSION 11/20/2009  . FOOT PAIN 11/20/2009  . Hypercholesterolemia without hypertriglyceridemia 06/18/2009  . 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 Malaise and fatigue 10/29/2008  . Abnormal loss of weight 10/29/2008  . 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 Affective bipolar disorder ([place]) 05/17/2008  . Barrett esophagus 05/17/2008    Past Surgical History:  Procedure Laterality Date  . ABDOMINAL HYSTERECTOMY    . CHOLECYSTECTOMY    . [place]  . TUBAL  LIGATION  1983    Prior to Admission medications   Medication Sig Start Date End Date Taking? Authorizing Provider  albuterol (PROVENTIL HFA;VENTOLIN HFA) 108 (90 Base) MCG/ACT inhaler Inhale 2 puffs into the lungs every 6 (six) hours as needed for wheezing or shortness of breath. 12/02/15   Rudene Re, MD  ALPRAZolam Duanne Moron) 0.5 MG tablet Take 1 tablet (0.5 mg total) by mouth 3 (three) times daily as needed for anxiety. 07/02/15   Mar Daring, PA-C  azithromycin (ZITHROMAX Z-PAK) 250 MG tablet Take 2 tablets (500 mg) on  Day 1,  followed by 1 tablet (250 mg) once daily on Days 2 through 5. 02/02/16   Johnn Hai, PA-C  chlorpheniramine-HYDROcodone (TUSSIONEX PENNKINETIC ER) 10-8 MG/5ML SUER Take 5 mLs by mouth every 12 (twelve) hours as needed for cough. 02/02/16   Johnn Hai, PA-C  gabapentin (NEURONTIN) 300 MG capsule Take by mouth. 01/14/16 05/13/16  Historical Provider, MD  HYDROcodone-acetaminophen (NORCO/VICODIN) 5-325 MG tablet Take 1 tablet by mouth every 6 (six) hours as needed. For pain 01/02/16   Vickki Muff Chrismon, PA  metoprolol tartrate (LOPRESSOR) 25 MG tablet Take by mouth. 01/02/16 01/01/17  Historical Provider, MD  nicotine (NICODERM CQ) 14 mg/24hr patch Place 1 patch (14 mg total) onto the skin daily. 01/28/16 03/10/16  Trinna Post, PA-C  pantoprazole (PROTONIX) 40 MG tablet TAKE 1 TABLET (40 MG TOTAL) BY MOUTH DAILY. 09/25/15   Mar Daring, PA-C  predniSONE (DELTASONE) 10 MG tablet Take  6 tablets  today, on day 2 take 5 tablets, day 3 take 4 tablets, day 4 take 3 tablets, day 5 take  2 tablets and 1 tablet the last day 02/02/16   Johnn Hai, PA-C  QUEtiapine (SEROQUEL) 50 MG tablet TAKE 1 TABLET BY MOUTH AT BEDTIME 01/25/16   Vickki Muff Chrismon, PA  simvastatin (ZOCOR) 40 MG tablet TAKE 1 TABLET BY MOUTH NIGHTLY AT BEDTIME 12/29/15   Vickki Muff Chrismon, PA    Allergies Ciprofloxacin; Codeine; Diazepam; Iodinated diagnostic agents; Metoprolol;  Morphine; Niacin; Niacin and related; and Toradol [ketorolac tromethamine]  Family History  Problem Relation Age of Onset  . Breast cancer Mother   . Migraines Sister   . Colon cancer Father   . Lung cancer Father   . Diabetes Paternal Grandmother   . Hypertension Paternal Grandmother   . Stroke Paternal Grandmother   . Dementia Paternal Grandfather     Social History Social History  Substance Use Topics  . Smoking status: Current Every Day Smoker    Packs/day: 0.50    Years: 30.00    Types: Cigarettes  . Smokeless tobacco: Never Used  . Alcohol use 0.0 oz/week     Comment: occasional    Review of Systems Constitutional: No fever/chills ENT: Positive sore throat. Cardiovascular: Denies chest pain. Respiratory: Positive shortness of breath. Positive nonproductive cough. Gastrointestinal: No abdominal pain.  No nausea, no vomiting.   Skin: Negative for rash. Neurological: Negative for headaches, focal weakness or numbness.  10-point ROS otherwise negative.  ____________________________________________   PHYSICAL EXAM:  VITAL SIGNS: ED Triage Vitals  Enc Vitals Group     BP 02/02/16 1604 (!) 113/91     Pulse Rate 02/02/16 1604 (!) 52     Resp 02/02/16 1604 18     Temp 02/02/16 1604 98.3 F (36.8 C)     Temp src --      SpO2 02/02/16 1604 99 %     Weight 02/02/16 1605 111 lb (50.3 kg)     Height 02/02/16 1605 5' (1.524 m)     Head Circumference --      Peak Flow --      Pain Score --      Pain Loc --      Pain Edu? --      Excl. in [place]? --     Constitutional: Alert and oriented. Well appearing and in no acute distress. Eyes: Conjunctivae are normal. PERRL. EOMI. Head: Atraumatic. Nose: Mild congestion/rhinnorhea.  EACs and TMs are clear bilaterally. Mouth/Throat: Mucous membranes are moist.  Oropharynx non-erythematous. Mild posterior drainage. Neck: No stridor.   Hematological/Lymphatic/Immunilogical: No cervical lymphadenopathy. Cardiovascular: Normal  rate, regular rhythm. Grossly normal heart sounds.  Good peripheral circulation. Respiratory: Normal respiratory effort.  No retractions. No wheezing or rhonchi is noted however patient has a very congested cough that is spasmodic at times. Musculoskeletal: Moves upper and lower extremities without any difficulty. Normal gait was noted. Neurologic:  Normal speech and language. No gross focal neurologic deficits are appreciated. No gait instability. Skin:  Skin is warm, dry and intact. No rash noted. Psychiatric: Mood and affect are normal. Speech and behavior are normal.  ____________________________________________   LABS (all labs ordered are listed, but only abnormal results are displayed)  Labs Reviewed - No data to display  RADIOLOGY  Chest x-ray per radiologist is negative for active cardiopulmonary disease. I, Johnn Hai, personally viewed and evaluated these images (plain radiographs) as part of my medical decision  making, as well as reviewing the written report by the radiologist. ____________________________________________   PROCEDURES  Procedure(s) performed: None  Procedures  Critical Care performed: No  ____________________________________________   INITIAL IMPRESSION / ASSESSMENT AND PLAN / ED COURSE  Pertinent labs & imaging results that were available during my care of the patient were reviewed by me and considered in my medical decision making (see chart for details).    Clinical Course    She was given DuoNeb treatment while in the emergency room and states that although she is coughing more she is able to breathe with less effort. Patient does have albuterol inhaler at home. She is also been taking a hydrocodone cough medication which is over a year old and  has a couple of teaspoons left. Patient was given a prescription for Zithromax and prednisone beginning at 60 mg and tapering down over the next 6 days. Patient was also given a prescription for  Tussionex 1 teaspoon every 12 hours with instructions not to drive while taking this medication. She will follow-up with her primary care doctor if any continued problems.  ____________________________________________   FINAL CLINICAL IMPRESSION(S) / ED DIAGNOSES  Final diagnoses:  Acute bronchitis, unspecified organism  Cigarette smoker      NEW MEDICATIONS STARTED DURING THIS VISIT:  Discharge Medication List as of 02/02/2016  6:10 PM    START taking these medications   Details  azithromycin (ZITHROMAX Z-PAK) 250 MG tablet Take 2 tablets (500 mg) on  Day 1,  followed by 1 tablet (250 mg) once daily on Days 2 through 5., Print    chlorpheniramine-HYDROcodone (TUSSIONEX PENNKINETIC ER) 10-8 MG/5ML SUER Take 5 mLs by mouth every 12 (twelve) hours as needed for cough., Starting Mon 02/02/2016, Print    predniSONE (DELTASONE) 10 MG tablet Take 6 tablets  today, on day 2 take 5 tablets, day 3 take 4 tablets, day 4 take 3 tablets, day 5 take  2 tablets and 1 tablet the last day, Print         Note:  This document was prepared using Dragon voice recognition software and may include unintentional dictation errors.    Johnn Hai, PA-C 02/02/16 1831    Delman Kitten, MD 02/02/16 2143

## 2016-02-03 ENCOUNTER — Encounter: Payer: Self-pay | Admitting: Family Medicine

## 2016-02-03 ENCOUNTER — Ambulatory Visit (INDEPENDENT_AMBULATORY_CARE_PROVIDER_SITE_OTHER): Payer: 59 | Admitting: Family Medicine

## 2016-02-03 VITALS — BP 110/64 | HR 56 | Temp 98.4°F | Resp 16 | Wt 109.0 lb

## 2016-02-03 DIAGNOSIS — J209 Acute bronchitis, unspecified: Secondary | ICD-10-CM

## 2016-02-03 DIAGNOSIS — M940 Chondrocostal junction syndrome [Tietze]: Secondary | ICD-10-CM | POA: Diagnosis not present

## 2016-02-03 MED ORDER — FLUTICASONE FUROATE-VILANTEROL 100-25 MCG/INH IN AEPB: 1.0000 | INHALATION_SPRAY | Freq: Every day | RESPIRATORY_TRACT | 0 refills | Status: DC

## 2016-02-03 NOTE — Progress Notes (Signed)
[name] Female    DOB: [birth year]   61 y.o.   MRN: [id number] Visit Date: 02/03/2016  Today's Provider: Vernie Murders, PA   Chief Complaint  Patient presents with  . Bronchitis    ER FU   Subjective:    HPI     Follow up ER visit  Patient was seen in ER for cough on 02/02/2016. She was treated for bronchitis. Treatment for this included DuoNeb tx in ED, Zithromax and 6 day prednisone taper, as well as Tussionex. She reports poor compliance with treatment. Pt has not been able to fill abx, Prednisone or Tussionex because the pharmacy was closed for New Years. Pt is currently waiting for those rx to be filled. Pt reports her albuterol inhaler is not improving sx, but the breathing tx in the ED did help. She reports this condition is Unchanged.  ------------------------------------------------------------------------------------  Patient Active Problem List   Diagnosis Date Noted  . 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 Acute onset aura migraine 07/10/2014  . Bad memory 07/10/2014  . Migraine without aura 07/10/2014  . Abdominal pain, epigastric 07/10/2014  . Chills with fever 07/10/2014  . Chronic constipation 07/10/2014  . Migraines   . Migraine 11/28/2011  . Weakness of left side of body 11/28/2011  . Acute anxiety 11/20/2009  . DEPRESSION 11/20/2009  . FOOT PAIN 11/20/2009  . Hypercholesterolemia without hypertriglyceridemia 06/18/2009  . 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 Malaise and fatigue 10/29/2008  . Abnormal loss of weight 10/29/2008  . 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 Affective bipolar disorder ([place]) 05/17/2008  . Barrett esophagus 05/17/2008   Past Surgical History:  Procedure Laterality Date  . ABDOMINAL HYSTERECTOMY    . CHOLECYSTECTOMY    . [place]  . TUBAL LIGATION  1983   Family History  Problem Relation Age of Onset    . Breast cancer Mother   . Migraines Sister   . Colon cancer Father   . Lung cancer Father   . Diabetes Paternal Grandmother   . Hypertension Paternal Grandmother   . Stroke Paternal Grandmother   . Dementia Paternal Grandfather    Social History  Substance Use Topics  . Smoking status: Current Every Day Smoker    Packs/day: 0.50    Years: 30.00    Types: Cigarettes  . Smokeless tobacco: Never Used  . Alcohol use 0.0 oz/week     Comment: occasional    Allergies  Allergen Reactions  . Ciprofloxacin Other (See Comments)  . Codeine Hives and Itching    REACTION: Dizziness  . Diazepam Other (See Comments)    Patient states "it makes me crazy" REACTION: Behavioral problems  . Iodinated Diagnostic Agents Other (See Comments)  . Metoprolol   . Morphine Hives  . Niacin Other (See Comments)  . Niacin And Related Other (See Comments)    flushing  . Toradol [Ketorolac Tromethamine] Itching    Tolerates when taken with Benadryl     Current Outpatient Prescriptions:  .  albuterol (PROVENTIL HFA;VENTOLIN HFA) 108 (90 Base) MCG/ACT inhaler, Inhale 2 puffs into the lungs every 6 (six) hours as needed for wheezing or shortness of breath., Disp: 1 Inhaler, Rfl: 2 .  ALPRAZolam (XANAX) 0.5 MG tablet, Take 1 tablet (0.5 mg total) by mouth 3 (three) times daily as needed for anxiety., Disp: 90 tablet, Rfl: 0 .  chlorpheniramine-HYDROcodone (TUSSIONEX PENNKINETIC ER) 10-8 MG/5ML  SUER, Take 5 mLs by mouth every 12 (twelve) hours as needed for cough., Disp: 115 mL, Rfl: 0 .  gabapentin (NEURONTIN) 300 MG capsule, Take by mouth., Disp: , Rfl:  .  HYDROcodone-acetaminophen (NORCO/VICODIN) 5-325 MG tablet, Take 1 tablet by mouth every 6 (six) hours as needed. For pain, Disp: 30 tablet, Rfl: 0 .  metoprolol tartrate (LOPRESSOR) 25 MG tablet, Take by mouth., Disp: , Rfl:  .  pantoprazole (PROTONIX) 40 MG tablet, TAKE 1 TABLET (40 MG TOTAL) BY MOUTH DAILY., Disp: 90 tablet, Rfl: 3 .  QUEtiapine  (SEROQUEL) 50 MG tablet, TAKE 1 TABLET BY MOUTH AT BEDTIME, Disp: 30 tablet, Rfl: 1 .  simvastatin (ZOCOR) 40 MG tablet, TAKE 1 TABLET BY MOUTH NIGHTLY AT BEDTIME, Disp: 30 tablet, Rfl: 5 .  azithromycin (ZITHROMAX Z-PAK) 250 MG tablet, Take 2 tablets (500 mg) on  Day 1,  followed by 1 tablet (250 mg) once daily on Days 2 through 5. (Patient not taking: Reported on 02/03/2016), Disp: 6 each, Rfl: 0 .  nicotine (NICODERM CQ) 14 mg/24hr patch, Place 1 patch (14 mg total) onto the skin daily. (Patient not taking: Reported on 02/03/2016), Disp: 42 patch, Rfl: 0 .  predniSONE (DELTASONE) 10 MG tablet, Take 6 tablets  today, on day 2 take 5 tablets, day 3 take 4 tablets, day 4 take 3 tablets, day 5 take  2 tablets and 1 tablet the last day (Patient not taking: Reported on 02/03/2016), Disp: 21 tablet, Rfl: 0  Review of Systems  Constitutional: Positive for activity change, chills, diaphoresis and fatigue. Negative for appetite change, fever and unexpected weight change.  Respiratory: Positive for cough (dry. Pt has tried Mucinex, Delsym, without relief), chest tightness, shortness of breath and wheezing.   Cardiovascular: Negative for chest pain, palpitations and leg swelling.    Social History  Substance Use Topics  . Smoking status: Current Every Day Smoker    Packs/day: 0.50    Years: 30.00    Types: Cigarettes  . Smokeless tobacco: Never Used  . Alcohol use 0.0 oz/week     Comment: occasional   Objective:   BP 110/64 (BP Location: Right Arm, Patient Position: Sitting, Cuff Size: Normal)   Pulse (!) 56 Comment: normal for pt due to Metoprolol  Temp 98.4 F (36.9 C) (Oral)   Resp 16   Wt 109 lb (49.4 kg)   SpO2 96%   BMI 21.29 kg/m   Physical Exam  Constitutional: She is oriented to person, place, and time. She appears well-developed and well-nourished.  HENT:  Head: Normocephalic.  Right Ear: External ear normal.  Left Ear: External ear normal.  Mouth/Throat: Oropharynx is clear and  moist.  Eyes: Conjunctivae are normal.  Neck: Neck supple.  Cardiovascular: Normal rate and regular rhythm.   Pulmonary/Chest: Effort normal and breath sounds normal.  Abdominal: Soft. Bowel sounds are normal.  Lymphadenopathy:    She has no cervical adenopathy.  Neurological: She is alert and oriented to person, place, and time.      Assessment & Plan:     1. Acute bronchitis, unspecified organism Onset over the past week. Still having cough and generalized malaise. Was seen in the ER yesterday with normal CXR and treated for bronchitis with prednisone, Tussionex and Z-pak. Will get these medications today (not able to get them yesterday due to New Year Holiday). Will give Breo Ellipta 1 inhalation qd today and encouraged to drink plenty of fluids. Recheck prn. - fluticasone furoate-vilanterol (BREO ELLIPTA) 100-25 MCG/INH  AEPB; Inhale 1 puff into the lungs daily.  Dispense: 14 each; Refill: 0  2. Costochondritis Still has some soreness in ribs around sternum. Has been back to full work schedule and wants to be fully released to work regular schedule by 02-06-16. Note written and recheck prn.     Patient seen and examined by Vernie Murders, PA, and note scribed by [name], CMA.  Vernie Murders, PA  [facility]

## 2016-03-27 ENCOUNTER — Other Ambulatory Visit: Payer: Self-pay | Admitting: Family Medicine

## 2016-03-27 DIAGNOSIS — G43009 Migraine without aura, not intractable, without status migrainosus: Secondary | ICD-10-CM

## 2016-03-27 DIAGNOSIS — F39 Unspecified mood [affective] disorder: Secondary | ICD-10-CM

## 2016-04-08 DIAGNOSIS — M79641 Pain in right hand: Secondary | ICD-10-CM | POA: Insufficient documentation

## 2016-04-08 DIAGNOSIS — M79642 Pain in left hand: Secondary | ICD-10-CM

## 2016-04-08 DIAGNOSIS — G5603 Carpal tunnel syndrome, bilateral upper limbs: Secondary | ICD-10-CM | POA: Insufficient documentation

## 2016-04-09 ENCOUNTER — Ambulatory Visit
Admission: RE | Admit: 2016-04-09 | Discharge: 2016-04-09 | Disposition: A | Discharge status: Home / Self Care | Payer: 59 | Source: Ambulatory Visit | Attending: Family Medicine | Admitting: Family Medicine

## 2016-04-09 ENCOUNTER — Ambulatory Visit (INDEPENDENT_AMBULATORY_CARE_PROVIDER_SITE_OTHER): Payer: 59 | Admitting: Family Medicine

## 2016-04-09 ENCOUNTER — Encounter: Payer: Self-pay | Admitting: Family Medicine

## 2016-04-09 VITALS — BP 104/62 | HR 58 | Temp 98.1°F | Resp 16 | Wt 107.0 lb

## 2016-04-09 DIAGNOSIS — M940 Chondrocostal junction syndrome [Tietze]: Secondary | ICD-10-CM | POA: Diagnosis not present

## 2016-04-09 DIAGNOSIS — M4722 Other spondylosis with radiculopathy, cervical region: Secondary | ICD-10-CM | POA: Diagnosis not present

## 2016-04-09 DIAGNOSIS — G5603 Carpal tunnel syndrome, bilateral upper limbs: Secondary | ICD-10-CM | POA: Diagnosis not present

## 2016-04-09 DIAGNOSIS — M549 Dorsalgia, unspecified: Secondary | ICD-10-CM | POA: Diagnosis not present

## 2016-04-09 MED ORDER — HYDROCODONE-ACETAMINOPHEN 5-325 MG PO TABS: 1.0000 | ORAL_TABLET | Freq: Four times a day (QID) | ORAL | 0 refills | Status: DC | PRN

## 2016-04-09 NOTE — Progress Notes (Signed)
[name] Female    DOB: January 15, 1956   61 y.o.   MRN: [id number] Visit Date: 04/09/2016  Today's Provider: Vernie Murders, PA   Chief Complaint  Patient presents with  . Back Pain   Subjective:    Back Pain  This is a new problem. The current episode started in the past 7 days (x 5 days. While lifting 35 pound boxes while at work). The problem occurs constantly. The problem has been gradually worsening since onset. The pain is present in the thoracic spine. The quality of the pain is described as aching (can feel stabbing with certain positions). The pain does not radiate. The pain is at a severity of 7/10. The pain is severe. The pain is worse during the day. The symptoms are aggravated by position. Associated symptoms include chest pain (costochondritis pain), headaches (increasing) and weakness. Pertinent negatives include no abdominal pain, bladder incontinence, dysuria, fever, leg pain, numbness, paresthesias, pelvic pain, tingling or weight loss. She has tried heat, ice and NSAIDs for the symptoms. The treatment provided no relief.   Patient Active Problem List   Diagnosis Date Noted  . 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 Acute onset aura migraine 07/10/2014  . Bad memory 07/10/2014  . Migraine without aura 07/10/2014  . Abdominal pain, epigastric 07/10/2014  . Chills with fever 07/10/2014  . Chronic constipation 07/10/2014  . Migraines   . Migraine 11/28/2011  . Weakness of left side of body 11/28/2011  . Acute anxiety 11/20/2009  . DEPRESSION 11/20/2009  . FOOT PAIN 11/20/2009  . Hypercholesterolemia without hypertriglyceridemia 06/18/2009  . 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 Malaise and fatigue 10/29/2008  . Abnormal loss of weight 10/29/2008  . 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 Affective bipolar disorder ([place]) 05/17/2008  . Barrett esophagus 05/17/2008   Past  Surgical History:  Procedure Laterality Date  . ABDOMINAL HYSTERECTOMY    . CHOLECYSTECTOMY    . [place]  . TUBAL LIGATION  1983   Family History  Problem Relation Age of Onset  . Breast cancer Mother   . Migraines Sister   . Colon cancer Father   . Lung cancer Father   . Diabetes Paternal Grandmother   . Hypertension Paternal Grandmother   . Stroke Paternal Grandmother   . Dementia Paternal Grandfather     Allergies  Allergen Reactions  . Ciprofloxacin Other (See Comments)  . Codeine Hives and Itching    REACTION: Dizziness  . Diazepam Other (See Comments)    Patient states "it makes me crazy" REACTION: Behavioral problems  . Iodinated Diagnostic Agents Other (See Comments)  . Metoprolol   . Morphine Hives  . Niacin Other (See Comments)  . Niacin And Related Other (See Comments)    flushing  . Toradol [Ketorolac Tromethamine] Itching    Tolerates when taken with Benadryl     Current Outpatient Prescriptions:  .  albuterol (PROVENTIL HFA;VENTOLIN HFA) 108 (90 Base) MCG/ACT inhaler, Inhale 2 puffs into the lungs every 6 (six) hours as needed for wheezing or shortness of breath., Disp: 1 Inhaler, Rfl: 2 .  ALPRAZolam (XANAX) 0.5 MG tablet, Take 1 tablet (0.5 mg total) by mouth 3 (three) times daily as needed for anxiety., Disp: 90 tablet, Rfl: 0 .  chlorpheniramine-HYDROcodone (TUSSIONEX PENNKINETIC ER) 10-8 MG/5ML SUER, Take 5 mLs by mouth every 12 (twelve) hours as needed for cough., Disp: 115 mL,  Rfl: 0 .  fluticasone furoate-vilanterol (BREO ELLIPTA) 100-25 MCG/INH AEPB, Inhale 1 puff into the lungs daily., Disp: 14 each, Rfl: 0 .  gabapentin (NEURONTIN) 300 MG capsule, Take by mouth., Disp: , Rfl:  .  HYDROcodone-acetaminophen (NORCO/VICODIN) 5-325 MG tablet, Take 1 tablet by mouth every 6 (six) hours as needed. For pain, Disp: 30 tablet, Rfl: 0 .  metoprolol tartrate (LOPRESSOR) 25 MG tablet, Take by mouth., Disp: , Rfl:  .  pantoprazole (PROTONIX)  40 MG tablet, TAKE 1 TABLET (40 MG TOTAL) BY MOUTH DAILY., Disp: 90 tablet, Rfl: 3 .  QUEtiapine (SEROQUEL) 50 MG tablet, TAKE 1 TABLET BY MOUTH AT BEDTIME, Disp: 30 tablet, Rfl: 3 .  simvastatin (ZOCOR) 40 MG tablet, TAKE 1 TABLET BY MOUTH NIGHTLY AT BEDTIME, Disp: 30 tablet, Rfl: 5 .  baclofen (LIORESAL) 10 MG tablet, TAKE 1 TABLET (10 MG TOTAL) BY MOUTH 2 (TWO) TIMES DAILY AS NEEDED., Disp: , Rfl: 2  Review of Systems  Constitutional: Negative for fever and weight loss.  Cardiovascular: Positive for chest pain (costochondritis pain).  Gastrointestinal: Negative for abdominal pain.  Genitourinary: Negative for bladder incontinence, dysuria and pelvic pain.  Musculoskeletal: Positive for back pain.  Neurological: Positive for weakness and headaches (increasing). Negative for tingling, numbness and paresthesias.    Social History  Substance Use Topics  . Smoking status: Current Every Day Smoker    Packs/day: 0.50    Years: 30.00    Types: Cigarettes  . Smokeless tobacco: Never Used  . Alcohol use 0.0 oz/week     Comment: occasional   Objective:   BP 104/62 (BP Location: Right Arm, Patient Position: Sitting, Cuff Size: Normal)   Pulse (!) 58   Temp 98.1 F (36.7 C) (Oral)   Resp 16   Wt 107 lb (48.5 kg)   SpO2 99%   BMI 20.90 kg/m  Vitals:   04/09/16 1501  BP: 104/62  Pulse: (!) 58  Resp: 16  Temp: 98.1 F (36.7 C)  TempSrc: Oral  SpO2: 99%  Weight: 107 lb (48.5 kg)   Physical Exam  Constitutional: She appears well-developed and well-nourished.  HENT:  Head: Normocephalic.  Neck:  Soreness in both trapezius muscles with tightness on the left.  Cardiovascular: Normal rate and regular rhythm.   Pulmonary/Chest: Effort normal and breath sounds normal.  Abdominal: Soft.  Musculoskeletal: She exhibits tenderness.  Tender across trapezius muscles to touch and across mid thoracic muscles. Increase pain to reach overhead or bend over. No radiation to legs. Weakness of  bilateral grip (L<R). Prominent right lumbar musculature - questionable rotational scoliosis.  Neurological: She has normal reflexes.  Tingling and weakness in all fingers.      Assessment & Plan:     1. Mid back pain Onset over the past week while lifting 35 lb boxes at work. Prominent right paravertebral muscles versus rotoscoliosis in the lower thoracic region. Tender trapezius muscles to palpate or test lifting strength with arms. Rheumatologist (Dr. Annalee Genta) and diagnosed with cervical spine degenerative arthritis. Will get thoracic spine films and refill Norco to use intermittently prn. Apply moist heat and try Aspercreme with Lidocaine. Recommend she change to less lifting at work (note written to her employer). Proceed with evaluation by Dr. Sharlet Salina (physiatrist) as scheduled by rheumatologist. - DG Thoracic Spine W/Swimmers - HYDROcodone-acetaminophen (NORCO/VICODIN) 5-325 MG tablet; Take 1 tablet by mouth every 6 (six) hours as needed. For pain  Dispense: 30 tablet; Refill: 0  2. Osteoarthritis of spine with radiculopathy, cervical region  Severe facet arthritis on the left at C3-C4 with edema in the facets. Mild left neuroforaminal stenosis. Degenerative disc disease at C6-C7 with prominent uncovertebral spur on the left with moderate left neuroforaminal stenosis on MRI 01-28-16 per Dr. Manuella Ghazi (neurologist). Has been referred to Dr. Sharlet Salina 05-24-16(physiatrist) for evaluation and considering possible cortisone injections.  3. Bilateral carpal tunnel syndrome Using splints on both wrists per Dr. Manuella Ghazi. Still having pains, tingling and weakness in hands (L>R). Continue follow up with neurologist.  4. Costochondritis Continues to have some chest pains and aches. Work requires a great deal of lifting. Note written to ask her employer to allow her to switch to a less strenuous position at work. Refilled Norco for acute flares not controlled by moist heat, Aspercreme with Lidocaine and  Tylenol. Not able to tolerate NSAID's due to Barrett's Esophagitis. Proceed with follow up with rheumatologist (Dr. Annalee Genta). - HYDROcodone-acetaminophen (NORCO/VICODIN) 5-325 MG tablet; Take 1 tablet by mouth every 6 (six) hours as needed. For pain  Dispense: 30 tablet; Refill: 0      Patient seen and examined by Vernie Murders, PA, and note scribed by [name], CMA.  Vernie Murders, PA  [facility]

## 2016-04-13 ENCOUNTER — Telehealth: Payer: Self-pay

## 2016-04-13 NOTE — Telephone Encounter (Signed)
-----   Message from Margo Common, [place] sent at 04/12/2016 10:09 AM EDT ----- Normal x-rays of back. No sign of fracture, vertebral collapse and no scoliosis. Seems to be muscle spasms.

## 2016-04-13 NOTE — Telephone Encounter (Signed)
LMTCB

## 2016-04-13 NOTE — Telephone Encounter (Signed)
Patient advised.

## 2016-04-14 ENCOUNTER — Ambulatory Visit: Payer: Self-pay | Admitting: Surgery

## 2016-04-19 ENCOUNTER — Telehealth: Payer: Self-pay

## 2016-04-19 ENCOUNTER — Other Ambulatory Visit: Payer: Self-pay | Admitting: Family Medicine

## 2016-04-19 DIAGNOSIS — F39 Unspecified mood [affective] disorder: Secondary | ICD-10-CM

## 2016-04-19 DIAGNOSIS — G43009 Migraine without aura, not intractable, without status migrainosus: Secondary | ICD-10-CM

## 2016-04-19 MED ORDER — QUETIAPINE FUMARATE 50 MG PO TABS: 50.0000 mg | ORAL_TABLET | Freq: Every day | ORAL | 3 refills | Status: DC

## 2016-04-19 NOTE — Telephone Encounter (Signed)
[facility] is requesting a 90 day supply on QUEtiapine (SEROQUEL) 50 MG tablet

## 2016-04-19 NOTE — Telephone Encounter (Signed)
Done

## 2016-05-05 ENCOUNTER — Other Ambulatory Visit: Payer: Self-pay | Admitting: Family Medicine

## 2016-06-30 ENCOUNTER — Telehealth: Payer: Self-pay | Admitting: Family Medicine

## 2016-06-30 NOTE — Telephone Encounter (Signed)
[id number] 

## 2016-06-30 NOTE — Telephone Encounter (Signed)
Refills should not be due until Aug 2018 per record. May need to be re-evaluated to see if still needed. Normally sees [place].

## 2016-06-30 NOTE — Telephone Encounter (Signed)
[facility] refill request on the following medications:  pantoprazole (PROTONIX) 40 MG tablet.  Take 1 tablet by mouth daily.  90 day supply.  [facility]

## 2016-07-01 NOTE — Telephone Encounter (Signed)
Pt is not out of the protonix and does not need a refill  teri

## 2016-07-06 ENCOUNTER — Encounter: Payer: Self-pay | Admitting: Family Medicine

## 2016-07-06 ENCOUNTER — Ambulatory Visit (INDEPENDENT_AMBULATORY_CARE_PROVIDER_SITE_OTHER): Payer: 59 | Admitting: Family Medicine

## 2016-07-06 VITALS — BP 102/66 | HR 51 | Temp 98.2°F | Wt 98.4 lb

## 2016-07-06 DIAGNOSIS — K22719 Barrett's esophagus with dysplasia, unspecified: Secondary | ICD-10-CM

## 2016-07-06 DIAGNOSIS — R634 Abnormal weight loss: Secondary | ICD-10-CM | POA: Diagnosis not present

## 2016-07-06 DIAGNOSIS — R197 Diarrhea, unspecified: Secondary | ICD-10-CM

## 2016-07-06 NOTE — Progress Notes (Signed)
[name] Female    DOB: 06-Dec-1955   61 y.o.   MRN: [id number] Visit Date: 07/06/2016  Today's Provider: Vernie Murders, PA   Chief Complaint  Patient presents with  . Weight Loss   Subjective:    HPI Patient is here to discuss unplanned weight loss. She reports her weight was 110 lb 6 weeks ago per Dr. Trena Platt scale. She recently weighed at his office and weigh was down to 94 lb. Patient's weight today is 98.4 lb. She has c/o feeling fatigue and having a headache constantly.   Wt Readings from Last 3 Encounters:  07/06/16 98 lb 6.4 oz (44.6 kg)  04/09/16 107 lb (48.5 kg)  02/03/16 109 lb (49.4 kg)   Past Medical History:  Diagnosis Date  . Bell palsy   . Migraines    Patient Active Problem List   Diagnosis Date Noted  . Bilateral carpal tunnel syndrome 04/08/2016  . Bilateral hand pain 04/08/2016  . 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 Acute onset aura migraine 07/10/2014  . Bad memory 07/10/2014  . Migraine without aura 07/10/2014  . Abdominal pain, epigastric 07/10/2014  . Chills with fever 07/10/2014  . Chronic constipation 07/10/2014  . Migraines   . Migraine 11/28/2011  . Weakness of left side of body 11/28/2011  . Acute anxiety 11/20/2009  . DEPRESSION 11/20/2009  . FOOT PAIN 11/20/2009  . Hypercholesterolemia without hypertriglyceridemia 06/18/2009  . 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 Malaise and fatigue 10/29/2008  . Abnormal loss of weight 10/29/2008  . 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 Affective bipolar disorder ([place]) 05/17/2008  . Barrett esophagus 05/17/2008   Past Surgical History:  Procedure Laterality Date  . ABDOMINAL HYSTERECTOMY    . CHOLECYSTECTOMY    . [place]  . TUBAL LIGATION  1983   Family History  Problem Relation Age of Onset  . Breast cancer Mother   . Migraines Sister   . Colon cancer Father   . Lung cancer  Father   . Diabetes Paternal Grandmother   . Hypertension Paternal Grandmother   . Stroke Paternal Grandmother   . Dementia Paternal Grandfather    Allergies  Allergen Reactions  . Ciprofloxacin Other (See Comments)  . Codeine Hives and Itching    REACTION: Dizziness  . Diazepam Other (See Comments)    Patient states "it makes me crazy" REACTION: Behavioral problems  . Iodinated Diagnostic Agents Other (See Comments)  . Metoprolol   . Morphine Hives  . Niacin Other (See Comments)  . Niacin And Related Other (See Comments)    flushing  . Toradol [Ketorolac Tromethamine] Itching    Tolerates when taken with Benadryl   Previous Medications   ALBUTEROL (PROVENTIL HFA;VENTOLIN HFA) 108 (90 BASE) MCG/ACT INHALER    Inhale 2 puffs into the lungs every 6 (six) hours as needed for wheezing or shortness of breath.   ALPRAZOLAM (XANAX) 0.5 MG TABLET    Take 1 tablet (0.5 mg total) by mouth 3 (three) times daily as needed for anxiety.   BACLOFEN (LIORESAL) 10 MG TABLET    TAKE 1 TABLET (10 MG TOTAL) BY MOUTH 2 (TWO) TIMES DAILY AS NEEDED.   GABAPENTIN (NEURONTIN) 300 MG CAPSULE    Take by mouth.   HYDROCODONE-ACETAMINOPHEN (NORCO/VICODIN) 5-325 MG TABLET    Take 1 tablet by mouth every 6 (six) hours as needed. For pain   METOPROLOL TARTRATE (LOPRESSOR) 25 MG  TABLET    Take by mouth.   PANTOPRAZOLE (PROTONIX) 40 MG TABLET    TAKE 1 TABLET (40 MG TOTAL) BY MOUTH DAILY.   QUETIAPINE (SEROQUEL) 50 MG TABLET    Take 1 tablet (50 mg total) by mouth at bedtime.   SIMVASTATIN (ZOCOR) 40 MG TABLET    TAKE 1 TABLET BY MOUTH NIGHTLY AT BEDTIME    Review of Systems  Constitutional: Positive for fatigue and unexpected weight change.  Respiratory: Negative.   Cardiovascular: Negative.   Neurological: Positive for headaches.    Social History  Substance Use Topics  . Smoking status: Current Every Day Smoker    Packs/day: 0.50    Years: 30.00    Types: Cigarettes  . Smokeless tobacco: Never Used    . Alcohol use 0.0 oz/week     Comment: occasional   Objective:   BP 102/66 (BP Location: Right Arm, Patient Position: Sitting, Cuff Size: Normal)   Pulse (!) 51   Temp 98.2 F (36.8 C) (Oral)   Wt 98 lb 6.4 oz (44.6 kg)   SpO2 99%   BMI 19.22 kg/m   Physical Exam  Constitutional: She is oriented to person, place, and time. She appears well-developed.  Thin and fatigued.  HENT:  Head: Normocephalic.  Nose: Nose normal.  Mouth/Throat: Oropharynx is clear and moist.  White debris in the right ear canal with dry dermatitis at introitus to canal. Left ear canal has one scab without erythema or drainage.  Eyes: Conjunctivae are normal.  Neck: Neck supple.  Cardiovascular: Normal rate and regular rhythm.   Pulmonary/Chest: Effort normal and breath sounds normal.  Abdominal: Soft. Bowel sounds are normal. She exhibits no mass.  Lymphadenopathy:    She has no cervical adenopathy.  Neurological: She is alert and oriented to person, place, and time.      Assessment & Plan:     1. Abnormal loss of weight Notice 16 lb weight loss at follow up appointment with Dr. Manuella Ghazi (neurologist) regarding cervical disc disease, bilateral carpal tunnel syndrome and lumbosacral radiculopathy at S1. Also, having bloody diarrhea with fatigue/weakness once a week the past 4 weeks. Will check CBC, CMP, Hep C antibody and HIV antibody. Recommend scheduling for follow up with GI and possible colonoscopy with EGD (similar weight loss with Barrett's esophagus and gastric ulcers in the past).  - HIV antibody - Hepatitis C Antibody - Ambulatory referral to Gastroenterology - CBC with Differential/Platelet - Comprehensive metabolic panel  2. Bloody diarrhea  Onset over the past 3-4 weeks. Occurs once a week and describes BM as bloody mucus causing lower abdominal cramps. Has weakness after 30-40 minutes of stools, then no further BM's or discomfort for a week. States weakness and weight loss is similar to past  history of gastric ulcers. No melena or epigastric discomfort. Recommend recheck with gastroenterologist and will get CBC with CMP. Stable without discomfort today. - Ambulatory referral to Gastroenterology - CBC with Differential/Platelet - Comprehensive metabolic panel  3. Barrett's esophagus with dysplasia Diagnosed on EGD in 2009. Found to have H.pylori and treated with antibiotics and PPI. In 2011 repeat upper endoscopy by Dr. Gustavo Lah showed erosive gastritis, gastric ulcer and a short segment of Barrett's esophagus. Colonoscopy at the same time showed one benign 2 mm polyp in the descending colon with redundant colon. Due for recheck by GI. - Ambulatory referral to Gastroenterology

## 2016-07-07 LAB — CBC WITH DIFFERENTIAL/PLATELET
BASOS ABS: 0 10*3/uL (ref 0.0–0.2)
Basos: 1 %
EOS (ABSOLUTE): 0.2 10*3/uL (ref 0.0–0.4)
Eos: 2 %
Hematocrit: 38 % (ref 34.0–46.6)
Hemoglobin: 12.9 g/dL (ref 11.1–15.9)
IMMATURE GRANS (ABS): 0 10*3/uL (ref 0.0–0.1)
IMMATURE GRANULOCYTES: 0 %
LYMPHS: 43 %
Lymphocytes Absolute: 2.8 10*3/uL (ref 0.7–3.1)
MCH: 31.5 pg (ref 26.6–33.0)
MCHC: 33.9 g/dL (ref 31.5–35.7)
MCV: 93 fL (ref 79–97)
MONOS ABS: 0.4 10*3/uL (ref 0.1–0.9)
Monocytes: 7 %
NEUTROS PCT: 47 %
Neutrophils Absolute: 3.1 10*3/uL (ref 1.4–7.0)
PLATELETS: 219 10*3/uL (ref 150–379)
RBC: 4.1 x10E6/uL (ref 3.77–5.28)
RDW: 13.7 % (ref 12.3–15.4)
WBC: 6.5 10*3/uL (ref 3.4–10.8)

## 2016-07-07 LAB — COMPREHENSIVE METABOLIC PANEL
A/G RATIO: 2.2 (ref 1.2–2.2)
ALT: 19 IU/L (ref 0–32)
AST: 21 IU/L (ref 0–40)
Albumin: 4.4 g/dL (ref 3.5–5.5)
Alkaline Phosphatase: 75 IU/L (ref 39–117)
BUN/Creatinine Ratio: 20 (ref 9–23)
BUN: 16 mg/dL (ref 6–24)
Bilirubin Total: 0.8 mg/dL (ref 0.0–1.2)
CALCIUM: 9.3 mg/dL (ref 8.7–10.2)
CHLORIDE: 106 mmol/L (ref 96–106)
CO2: 25 mmol/L (ref 18–29)
Creatinine, Ser: 0.8 mg/dL (ref 0.57–1.00)
GFR, EST AFRICAN AMERICAN: 93 mL/min/{1.73_m2} (ref >59)
GFR, EST NON AFRICAN AMERICAN: 81 mL/min/{1.73_m2} (ref >59)
GLUCOSE: 90 mg/dL (ref 65–99)
Globulin, Total: 2 g/dL (ref 1.5–4.5)
Potassium: 3.7 mmol/L (ref 3.5–5.2)
Sodium: 143 mmol/L (ref 134–144)
TOTAL PROTEIN: 6.4 g/dL (ref 6.0–8.5)

## 2016-07-07 LAB — HIV ANTIBODY (ROUTINE TESTING W REFLEX): HIV SCREEN 4TH GENERATION: NONREACTIVE

## 2016-07-07 LAB — HEPATITIS C ANTIBODY

## 2016-07-08 ENCOUNTER — Telehealth: Payer: Self-pay

## 2016-07-08 NOTE — Telephone Encounter (Signed)
Advised patient as below. Patient reports that she has appt with GI on 6/13.

## 2016-07-08 NOTE — Telephone Encounter (Signed)
LMTCB

## 2016-07-08 NOTE — Telephone Encounter (Signed)
-----   Message from Margo Common, [place] sent at 07/08/2016  7:52 AM EDT ----- All blood tests are normal. No sign of anemia. Proceed with referral to gastroenterologist.

## 2016-07-14 ENCOUNTER — Ambulatory Visit: Payer: 59 | Admitting: Gastroenterology

## 2016-07-14 ENCOUNTER — Other Ambulatory Visit: Payer: Self-pay

## 2016-07-14 ENCOUNTER — Ambulatory Visit
Admission: RE | Admit: 2016-07-14 | Discharge: 2016-07-14 | Disposition: A | Discharge status: Home / Self Care | Payer: 59 | Source: Ambulatory Visit | Attending: Gastroenterology | Admitting: Gastroenterology

## 2016-07-14 ENCOUNTER — Ambulatory Visit (INDEPENDENT_AMBULATORY_CARE_PROVIDER_SITE_OTHER): Payer: 59 | Admitting: Gastroenterology

## 2016-07-14 VITALS — BP 124/73 | HR 66 | Temp 98.9°F | Ht 60.0 in | Wt 98.0 lb

## 2016-07-14 DIAGNOSIS — R194 Change in bowel habit: Secondary | ICD-10-CM | POA: Diagnosis not present

## 2016-07-14 DIAGNOSIS — Z800 Family history of malignant neoplasm of digestive organs: Secondary | ICD-10-CM

## 2016-07-14 DIAGNOSIS — K5900 Constipation, unspecified: Secondary | ICD-10-CM

## 2016-07-14 DIAGNOSIS — R1010 Upper abdominal pain, unspecified: Secondary | ICD-10-CM | POA: Diagnosis not present

## 2016-07-14 DIAGNOSIS — R634 Abnormal weight loss: Secondary | ICD-10-CM

## 2016-07-14 DIAGNOSIS — K921 Melena: Secondary | ICD-10-CM

## 2016-07-14 MED ORDER — POLYETHYLENE GLYCOL 3350 17 GM/SCOOP PO POWD: 1.0000 | Freq: Every day | ORAL | 3 refills | Status: DC

## 2016-07-14 NOTE — Progress Notes (Signed)
[name] MD, MRCP(U.K) [address]  [place]  [place], [place] [address]  Main: [phone]  Fax: [phone]   Gastroenterology Consultation  Referring Provider:     Margo Common, PA Primary Care Physician:  Margo Common, [place] Primary Gastroenterologist:  Dr. [name]  Reason for Consultation:     Bloody diarrhea         HPI:   Jordan Ortega is a 61 y.o. y/o female referred for consultation & management  by Dr. Natale Milch, Vickki Muff, PA.    Says she has had diarrhea for about 6 weeks. She has "diarrhea" once very 2-3 days with some blood, when she wipes on top of the stool. Initially hard "balls" then very watery. At times when she has a bowel movement it hurts but only passes mucus. Father had colon cancer, last colonoscopy per patient in 2011 and recalls she had polyps which were taken out by Dr Gustavo Lah. She was advised to return in 5 years. She lost 16 lbs over 6 weeks. When she eats has lower abdominal pain , usually begins in an hour, lasts for about a few hours.   Labs 07/2016- normal CMP,CBC. Denies any NSAID use. Not had a bowel movement since Saturday.   Past Medical History:  Diagnosis Date  . Bell palsy   . Migraines     Past Surgical History:  Procedure Laterality Date  . ABDOMINAL HYSTERECTOMY    . CHOLECYSTECTOMY    . [place]  . TUBAL LIGATION  1983    Prior to Admission medications   Medication Sig Start Date End Date Taking? Authorizing Provider  albuterol (PROVENTIL HFA;VENTOLIN HFA) 108 (90 Base) MCG/ACT inhaler Inhale 2 puffs into the lungs every 6 (six) hours as needed for wheezing or shortness of breath. 12/02/15   Rudene Re, MD  ALPRAZolam Duanne Moron) 0.5 MG tablet Take 1 tablet (0.5 mg total) by mouth 3 (three) times daily as needed for anxiety. 07/02/15   Mar Daring, PA-C  baclofen (LIORESAL) 10 MG tablet TAKE 1 TABLET (10 MG TOTAL) BY MOUTH 2 (TWO) TIMES DAILY AS NEEDED. 03/25/16   [provider]  gabapentin (NEURONTIN) 300 MG capsule Take by mouth. 01/14/16 05/13/16  [provider]  gabapentin (NEURONTIN) 300 MG capsule TAKE 1 CAPSULE (300 MG TOTAL) BY MOUTH NIGHTLY. 07/09/16   [provider]  HYDROcodone-acetaminophen (NORCO/VICODIN) 5-325 MG tablet Take 1 tablet by mouth every 6 (six) hours as needed. For pain 04/09/16   Chrismon, Simona Huh E, PA  metoprolol tartrate (LOPRESSOR) 25 MG tablet Take by mouth. 01/02/16 01/01/17  [provider]  pantoprazole (PROTONIX) 40 MG tablet TAKE 1 TABLET (40 MG TOTAL) BY MOUTH DAILY. 09/25/15   Mar Daring, PA-C  QUEtiapine (SEROQUEL) 50 MG tablet Take 1 tablet (50 mg total) by mouth at bedtime. Patient taking differently: Take 75 mg by mouth at bedtime.  04/19/16   Chrismon, Vickki Muff, PA  simvastatin (ZOCOR) 40 MG tablet TAKE 1 TABLET BY MOUTH NIGHTLY AT BEDTIME 12/29/15   Chrismon, Vickki Muff, PA    Family History  Problem Relation Age of Onset  . Breast cancer Mother   . Migraines Sister   . Colon cancer Father   . Lung cancer Father   . Diabetes Paternal Grandmother   . Hypertension Paternal Grandmother   . Stroke Paternal Grandmother   . Dementia Paternal Grandfather      Social History  Substance Use Topics  . Smoking  status: Current Every Day Smoker    Packs/day: 0.50    Years: 30.00    Types: Cigarettes  . Smokeless tobacco: Never Used  . Alcohol use 0.0 oz/week     Comment: occasional    Allergies as of 07/14/2016 - Review Complete 07/06/2016  Allergen Reaction Noted  . Ciprofloxacin Other (See Comments) 07/11/2014  . Codeine Hives and Itching 11/20/2009  . Diazepam Other (See Comments) 11/20/2009  . Iodinated diagnostic agents Other (See Comments) 07/11/2014  . Metoprolol  07/10/2014  . Morphine Hives 11/20/2009  . Niacin Other (See Comments) 07/11/2014  . Niacin and related Other (See Comments) 11/28/2011  . Toradol [ketorolac tromethamine] Itching 07/10/2014    Review of  Systems:    All systems reviewed and negative except where noted in HPI.   Physical Exam:  There were no vitals taken for this visit. No LMP recorded. Patient has had a hysterectomy. Psych:  Alert and cooperative. Normal mood and affect. General:   Alert,  Well-developed, well-nourished, pleasant and cooperative in NAD Head:  Normocephalic and atraumatic. Eyes:  Sclera clear, no icterus.   Conjunctiva pink. Ears:  Normal auditory acuity. Nose:  No deformity, discharge, or lesions. Mouth:  No deformity or lesions,oropharynx pink & moist. Neck:  Supple; no masses or thyromegaly. Lungs:  Respirations even and unlabored.  Clear throughout to auscultation.   No wheezes, crackles, or rhonchi. No acute distress. Heart:  Regular rate and rhythm; no murmurs, clicks, rubs, or gallops. Abdomen:  Normal bowel sounds.  No bruits.  Soft, non-tender and non-distended without masses, hepatosplenomegaly or hernias noted.  No guarding or rebound tenderness.    Msk:  Symmetrical without gross deformities. Good, equal movement & strength bilaterally. Pulses:  Normal pulses noted. Extremities:  No clubbing or edema.  No cyanosis. Neurologic:  Alert and oriented x3;  grossly normal neurologically. Skin:  Intact without significant lesions or rashes. No jaundice. Lymph Nodes:  No significant cervical adenopathy. Psych:  Alert and cooperative. Normal mood and affect.  Imaging Studies: No results found.  Assessment and Plan:   Jordan Ortega is a 61 y.o. y/o female has been referred for diarrhea/rectal bleeding . She has lost weight unintentionally. She has a history of colon polyps, abdominal pain.   Plan  1. Stool for H pylori  2. Diarrhea likely spurious from actual constipation - will check stool pcr and perform abdominal x ray to r/o constipation. Daily miralax from today  3. EGD+colonoscopy on Friday and if negative then CT chest/abdomen/pelvis for unintentional weight loss.    Follow up in 6  weeks.   Dr [name] MD,MRCP(U.K)

## 2016-07-15 ENCOUNTER — Telehealth: Payer: Self-pay | Admitting: Gastroenterology

## 2016-07-15 ENCOUNTER — Other Ambulatory Visit: Payer: Self-pay

## 2016-07-15 ENCOUNTER — Encounter: Payer: Self-pay | Admitting: Gastroenterology

## 2016-07-15 DIAGNOSIS — K5900 Constipation, unspecified: Secondary | ICD-10-CM

## 2016-07-15 MED ORDER — POLYETHYLENE GLYCOL 3350 17 GM/SCOOP PO POWD: 17.0000 g | Freq: Every day | ORAL | 3 refills | Status: AC

## 2016-07-15 NOTE — Telephone Encounter (Signed)
07/15/16 [employer] State NO prior auth required for Colonoscopy [id number] /  EGD 14840.  07/15/16 [employer] spoke with Arlys John is required for Colonoscopy [place] / EGD 39795 R63.4, K92.1 & Z80.0 Ref#: [id number]  Case #: [id number].

## 2016-07-16 ENCOUNTER — Encounter: Payer: Self-pay | Admitting: *Deleted

## 2016-07-16 ENCOUNTER — Ambulatory Visit
Admission: RE | Admit: 2016-07-16 | Discharge: 2016-07-16 | Disposition: A | Discharge status: Home / Self Care | Payer: 59 | Source: Ambulatory Visit | Attending: Gastroenterology | Admitting: Gastroenterology

## 2016-07-16 ENCOUNTER — Ambulatory Visit: Payer: 59 | Admitting: Anesthesiology

## 2016-07-16 ENCOUNTER — Encounter
Admission: RE | Disposition: A | Discharge status: Home / Self Care | Payer: Self-pay | Source: Ambulatory Visit | Attending: Gastroenterology

## 2016-07-16 DIAGNOSIS — Z800 Family history of malignant neoplasm of digestive organs: Secondary | ICD-10-CM | POA: Insufficient documentation

## 2016-07-16 DIAGNOSIS — K319 Disease of stomach and duodenum, unspecified: Secondary | ICD-10-CM | POA: Diagnosis not present

## 2016-07-16 DIAGNOSIS — F329 Major depressive disorder, single episode, unspecified: Secondary | ICD-10-CM | POA: Insufficient documentation

## 2016-07-16 DIAGNOSIS — D125 Benign neoplasm of sigmoid colon: Secondary | ICD-10-CM

## 2016-07-16 DIAGNOSIS — K921 Melena: Secondary | ICD-10-CM | POA: Diagnosis not present

## 2016-07-16 DIAGNOSIS — K635 Polyp of colon: Secondary | ICD-10-CM | POA: Insufficient documentation

## 2016-07-16 DIAGNOSIS — F17210 Nicotine dependence, cigarettes, uncomplicated: Secondary | ICD-10-CM | POA: Diagnosis not present

## 2016-07-16 DIAGNOSIS — Z79899 Other long term (current) drug therapy: Secondary | ICD-10-CM | POA: Diagnosis not present

## 2016-07-16 DIAGNOSIS — R1010 Upper abdominal pain, unspecified: Secondary | ICD-10-CM

## 2016-07-16 DIAGNOSIS — F419 Anxiety disorder, unspecified: Secondary | ICD-10-CM | POA: Diagnosis not present

## 2016-07-16 DIAGNOSIS — R634 Abnormal weight loss: Secondary | ICD-10-CM

## 2016-07-16 DIAGNOSIS — K2970 Gastritis, unspecified, without bleeding: Secondary | ICD-10-CM | POA: Insufficient documentation

## 2016-07-16 DIAGNOSIS — K640 First degree hemorrhoids: Secondary | ICD-10-CM | POA: Diagnosis not present

## 2016-07-16 HISTORY — DX: Barrett's esophagus without dysplasia: K22.70

## 2016-07-16 HISTORY — PX: COLONOSCOPY WITH PROPOFOL: SHX5780

## 2016-07-16 HISTORY — PX: ESOPHAGOGASTRODUODENOSCOPY (EGD) WITH PROPOFOL: SHX5813

## 2016-07-16 SURGERY — COLONOSCOPY WITH PROPOFOL
Anesthesia: General

## 2016-07-16 MED ORDER — FENTANYL CITRATE (PF) 100 MCG/2ML IJ SOLN: 25.0000 ug | INTRAMUSCULAR | Status: DC | PRN

## 2016-07-16 MED ORDER — SODIUM CHLORIDE 0.9 % IV SOLN
INTRAVENOUS | Status: DC
  Administered 2016-07-16: 1000 mL via INTRAVENOUS

## 2016-07-16 MED ORDER — ONDANSETRON HCL 4 MG/2ML IJ SOLN: 4.0000 mg | Freq: Once | INTRAMUSCULAR | Status: DC

## 2016-07-16 MED ORDER — ONDANSETRON HCL 4 MG/2ML IJ SOLN
4.0000 mg | Freq: Once | INTRAMUSCULAR | Status: AC | PRN
  Administered 2016-07-16: 4 mg via INTRAVENOUS

## 2016-07-16 MED ORDER — FENTANYL CITRATE (PF) 100 MCG/2ML IJ SOLN
INTRAMUSCULAR | Status: AC
  Filled 2016-07-16: qty 2

## 2016-07-16 MED ORDER — MIDAZOLAM HCL 2 MG/2ML IJ SOLN
INTRAMUSCULAR | Status: AC
  Filled 2016-07-16: qty 2

## 2016-07-16 MED ORDER — PROPOFOL 10 MG/ML IV BOLUS
INTRAVENOUS | Status: DC | PRN
  Administered 2016-07-16 (×3): 20 mg via INTRAVENOUS
  Administered 2016-07-16: 40 mg via INTRAVENOUS
  Administered 2016-07-16: 20 mg via INTRAVENOUS
  Administered 2016-07-16 (×2): 30 mg via INTRAVENOUS

## 2016-07-16 MED ORDER — MIDAZOLAM HCL 2 MG/2ML IJ SOLN
INTRAMUSCULAR | Status: DC | PRN
  Administered 2016-07-16 (×2): 1 mg via INTRAVENOUS

## 2016-07-16 MED ORDER — ONDANSETRON HCL 4 MG/2ML IJ SOLN
INTRAMUSCULAR | Status: AC
  Administered 2016-07-16: 4 mg via INTRAVENOUS
  Filled 2016-07-16: qty 2

## 2016-07-16 MED ORDER — LIDOCAINE HCL (CARDIAC) 20 MG/ML IV SOLN
INTRAVENOUS | Status: DC | PRN
  Administered 2016-07-16: 40 mg via INTRATRACHEAL

## 2016-07-16 MED ORDER — PROPOFOL 500 MG/50ML IV EMUL
INTRAVENOUS | Status: DC | PRN
  Administered 2016-07-16: 120 ug/kg/min via INTRAVENOUS

## 2016-07-16 NOTE — Op Note (Signed)
[facility] Gastroenterology Patient Name: Jordan Ortega Procedure Date: 07/16/2016 1:27 PM MRN: [id number] Account #: [id number] Date of Birth: [birth year] Admit Type: Outpatient Age: 61 Room: [facility] ENDO ROOM 3 Gender: Female Note Status: Finalized Procedure:            Colonoscopy Indications:          Rectal bleeding Providers:            [name] MD, MD Referring MD:         Vickki Muff. Chrismon, MD (Referring MD) Medicines:            Monitored Anesthesia Care Complications:        No immediate complications. Procedure:            Pre-Anesthesia Assessment:                       - Prior to the procedure, a History and Physical was                        performed, and patient medications, allergies and                        sensitivities were reviewed. The patient's tolerance of                        previous anesthesia was reviewed.                       - The risks and benefits of the procedure and the                        sedation options and risks were discussed with the                        patient. All questions were answered and informed                        consent was obtained.                       - ASA Grade Assessment: III - A patient with severe                        systemic disease.                       After obtaining informed consent, the colonoscope was                        passed under direct vision. Throughout the procedure,                        the patient's blood pressure, pulse, and oxygen                        saturations were monitored continuously. The                        Colonoscope was introduced through the anus and                        advanced  to the the cecum, identified by the                        appendiceal orifice, IC valve and transillumination.                        The colonoscopy was performed with ease. The patient                        tolerated the procedure well. The quality of the bowel                       preparation was good. Findings:      The perianal and digital rectal examinations were normal.      Non-bleeding internal hemorrhoids were found during retroflexion. The       hemorrhoids were medium-sized and Grade I (internal hemorrhoids that do       not prolapse).      A 5 mm polyp was found in the sigmoid colon. The polyp was sessile. The       polyp was removed with a cold biopsy forceps. Resection and retrieval       were complete.      The exam was otherwise without abnormality on direct and retroflexion       views. Impression:           - Non-bleeding internal hemorrhoids.                       - One 5 mm polyp in the sigmoid colon, removed with a                        cold biopsy forceps. Resected and retrieved.                       - The examination was otherwise normal on direct and                        retroflexion views. Recommendation:       - Discharge patient to home (with escort).                       - Resume previous diet.                       - Continue present medications.                       - Await pathology results.                       - Repeat colonoscopy in 5 years for surveillance.                       - Return to GI office in 4 weeks. Procedure Code(s):    --- Professional ---                       [phone], Colonoscopy, flexible; with biopsy, single or                        multiple Diagnosis Code(s):    --- Professional ---  D12.5, Benign neoplasm of sigmoid colon                       K64.0, First degree hemorrhoids                       K62.5, Hemorrhage of anus and rectum CPT copyright 2016 American Medical Association. All rights reserved. The codes documented in this report are preliminary and upon coder review may  be revised to meet current compliance requirements. [name], MD [name] MD, MD 07/16/2016 2:01:01 PM This report has been signed electronically. Number of Addenda: 0 Note  Initiated On: 07/16/2016 1:27 PM Scope Withdrawal Time: 0 hours 9 minutes 20 seconds  Total Procedure Duration: 0 hours 17 minutes 44 seconds       [facility]

## 2016-07-16 NOTE — Anesthesia Postprocedure Evaluation (Signed)
Anesthesia Post Note  Patient: Alyvia Derk  Procedure(s) Performed: Procedure(s) (LRB): COLONOSCOPY WITH PROPOFOL (N/A) ESOPHAGOGASTRODUODENOSCOPY (EGD) WITH PROPOFOL (N/A)  Patient location during evaluation: PACU Anesthesia Type: General Level of consciousness: awake and alert and oriented Pain management: pain level controlled Vital Signs Assessment: post-procedure vital signs reviewed and stable Respiratory status: spontaneous breathing Cardiovascular status: blood pressure returned to baseline Anesthetic complications: no     Last Vitals:  Vitals:   07/16/16 1213 07/16/16 1406  BP: 104/80 103/62  Pulse: 60 61  Resp: 20 20  Temp: (!) 35.9 C (!) 36.1 C    Last Pain:  Vitals:   07/16/16 1406  TempSrc: Tympanic  PainSc:                  Emalynn Clewis

## 2016-07-16 NOTE — H&P (Signed)
[name] MD [address], [place], [place] [address] Phone: [phone] Fax : [phone]  Primary Care Physician:  Margo Common, [place] Primary Gastroenterologist:  Dr. [name]   Pre-Procedure History & Physical: HPI:  Jordan Ortega is a 61 y.o. female is here for an endoscopy and colonoscopy.   Past Medical History:  Diagnosis Date  . Barrett's esophagus   . Bell palsy   . Migraines     Past Surgical History:  Procedure Laterality Date  . ABDOMINAL HYSTERECTOMY    . CHOLECYSTECTOMY    . [place]  . TUBAL LIGATION  1983    Prior to Admission medications   Medication Sig Start Date End Date Taking? Authorizing Provider  metoprolol tartrate (LOPRESSOR) 25 MG tablet Take by mouth. 01/02/16 01/01/17 Yes [provider]  albuterol (PROVENTIL HFA;VENTOLIN HFA) 108 (90 Base) MCG/ACT inhaler Inhale 2 puffs into the lungs every 6 (six) hours as needed for wheezing or shortness of breath. 12/02/15   Rudene Re, MD  ALPRAZolam Duanne Moron) 0.5 MG tablet Take 1 tablet (0.5 mg total) by mouth 3 (three) times daily as needed for anxiety. 07/02/15   Mar Daring, PA-C  baclofen (LIORESAL) 10 MG tablet TAKE 1 TABLET (10 MG TOTAL) BY MOUTH 2 (TWO) TIMES DAILY AS NEEDED. 03/25/16   [provider]  gabapentin (NEURONTIN) 300 MG capsule Take by mouth. 01/14/16 05/13/16  [provider]  gabapentin (NEURONTIN) 300 MG capsule TAKE 1 CAPSULE (300 MG TOTAL) BY MOUTH NIGHTLY. 07/09/16   [provider]  HYDROcodone-acetaminophen (NORCO/VICODIN) 5-325 MG tablet Take 1 tablet by mouth every 6 (six) hours as needed. For pain 04/09/16   Chrismon, Simona Huh E, PA  pantoprazole (PROTONIX) 40 MG tablet TAKE 1 TABLET (40 MG TOTAL) BY MOUTH DAILY. 09/25/15   Mar Daring, PA-C  polyethylene glycol powder (GLYCOLAX/MIRALAX) powder Take 17 g by mouth daily. 07/15/16 09/14/16  [name], MD  QUEtiapine (SEROQUEL) 50 MG tablet Take 1  tablet (50 mg total) by mouth at bedtime. Patient taking differently: Take 75 mg by mouth at bedtime.  04/19/16   Chrismon, Vickki Muff, PA  simvastatin (ZOCOR) 40 MG tablet TAKE 1 TABLET BY MOUTH NIGHTLY AT BEDTIME 12/29/15   Chrismon, Vickki Muff, PA    Allergies as of 07/14/2016 - Review Complete 07/14/2016  Allergen Reaction Noted  . Ciprofloxacin Other (See Comments) 07/11/2014  . Codeine Hives and Itching 11/20/2009  . Diazepam Other (See Comments) 11/20/2009  . Iodinated diagnostic agents Other (See Comments) 07/11/2014  . Metoprolol  07/10/2014  . Morphine Hives 11/20/2009  . Niacin Other (See Comments) 07/11/2014  . Niacin and related Other (See Comments) 11/28/2011  . Toradol [ketorolac tromethamine] Itching 07/10/2014    Family History  Problem Relation Age of Onset  . Breast cancer Mother   . Migraines Sister   . Colon cancer Father   . Lung cancer Father   . Diabetes Paternal Grandmother   . Hypertension Paternal Grandmother   . Stroke Paternal Grandmother   . Dementia Paternal Grandfather     Social History   Social History  . Marital status: Married    Spouse name: N/A  . Number of children: N/A  . Years of education: N/A   Occupational History  . Not on file.   Social History Main Topics  . Smoking status: Current Every Day Smoker    Packs/day: 0.50    Years: 30.00    Types: Cigarettes  . Smokeless tobacco:  Never Used  . Alcohol use 0.0 oz/week     Comment: occasional  . Drug use: No  . Sexual activity: Not on file   Other Topics Concern  . Not on file   Social History Narrative  . No narrative on file    Review of Systems: See HPI, otherwise negative ROS  Physical Exam: BP 104/80   Pulse 60   Temp (!) 96.6 F (35.9 C) (Tympanic)   Resp 20   Ht 5' (1.524 m)   Wt 93 lb (42.2 kg)   SpO2 100%   BMI 18.16 kg/m  General:   Alert,  pleasant and cooperative in NAD Head:  Normocephalic and atraumatic. Neck:  Supple; no masses or  thyromegaly. Lungs:  Clear throughout to auscultation.    Heart:  Regular rate and rhythm. Abdomen:  Soft, nontender and nondistended. Normal bowel sounds, without guarding, and without rebound.   Neurologic:  Alert and  oriented x4;  grossly normal neurologically.  Impression/Plan: Jordan Ortega is here for an endoscopy and colonoscopy to be performed for abdominal pain , rectal bleeding  Risks, benefits, limitations, and alternatives regarding  endoscopy and colonoscopy have been reviewed with the patient.  Questions have been answered.  All parties agreeable.   [name], MD  07/16/2016, 1:16 PM

## 2016-07-16 NOTE — Transfer of Care (Signed)
Immediate Anesthesia Transfer of Care Note  Patient: Kelsea Mousel  Procedure(s) Performed: Procedure(s): COLONOSCOPY WITH PROPOFOL (N/A) ESOPHAGOGASTRODUODENOSCOPY (EGD) WITH PROPOFOL (N/A)  Patient Location: PACU  Anesthesia Type:General  Level of Consciousness: awake, alert  and oriented  Airway & Oxygen Therapy: Patient Spontanous Breathing and Patient connected to nasal cannula oxygen  Post-op Assessment: Report given to RN and Post -op Vital signs reviewed and stable  Post vital signs: Reviewed and stable  Last Vitals:  Vitals:   07/16/16 1213 07/16/16 1406  BP: 104/80 103/62  Pulse: 60 61  Resp: 20 20  Temp: (!) 35.9 C (!) 36.1 C    Last Pain:  Vitals:   07/16/16 1406  TempSrc: Tympanic  PainSc:          Complications: No apparent anesthesia complications

## 2016-07-16 NOTE — Anesthesia Preprocedure Evaluation (Signed)
Anesthesia Evaluation  Patient identified by MRN, date of birth, ID band Patient awake    Reviewed: Allergy & Precautions, NPO status , Patient's Chart, lab work & pertinent test results  Airway Mallampati: III  TM Distance: <3 FB     Dental  (+) Caps   Pulmonary Current Smoker,    Pulmonary exam normal        Cardiovascular negative cardio ROS Normal cardiovascular exam     Neuro/Psych  Headaches, PSYCHIATRIC DISORDERS Anxiety Depression  Neuromuscular disease    GI/Hepatic Neg liver ROS, Barrett's esophagus   Endo/Other  negative endocrine ROS  Renal/GU negative Renal ROS  negative genitourinary   Musculoskeletal negative musculoskeletal ROS (+)   Abdominal Normal abdominal exam  (+)   Peds negative pediatric ROS (+)  Hematology negative hematology ROS (+)   Anesthesia Other Findings   Reproductive/Obstetrics                             Anesthesia Physical Anesthesia Plan  ASA: II  Anesthesia Plan: General   Post-op Pain Management:    Induction: Intravenous  PONV Risk Score and Plan:   Airway Management Planned: Nasal Cannula  Additional Equipment:   Intra-op Plan:   Post-operative Plan:   Informed Consent: I have reviewed the patients History and Physical, chart, labs and discussed the procedure including the risks, benefits and alternatives for the proposed anesthesia with the patient or authorized representative who has indicated his/her understanding and acceptance.   Dental advisory given  Plan Discussed with: CRNA and Surgeon  Anesthesia Plan Comments:         Anesthesia Quick Evaluation

## 2016-07-16 NOTE — Anesthesia Post-op Follow-up Note (Cosign Needed)
Anesthesia QCDR form completed.        

## 2016-07-16 NOTE — Op Note (Signed)
[facility] Patient Name: Jordan Ortega Procedure Date: 07/16/2016 1:27 PM MRN: [id number] Account #: [id number] Date of Birth: 11-20-1955 Admit Type: Outpatient Age: 61 Room: [facility] ENDO ROOM 3 Gender: Female Note Status: Finalized Procedure:            Upper GI endoscopy Indications:          Epigastric abdominal pain Providers:            [name] MD, MD Referring MD:         Vickki Muff. Chrismon, MD (Referring MD) Medicines:            Monitored Anesthesia Care Complications:        No immediate complications. Procedure:            Pre-Anesthesia Assessment:                       - Prior to the procedure, a History and Physical was                        performed, and patient medications, allergies and                        sensitivities were reviewed. The patient's tolerance of                        previous anesthesia was reviewed.                       - The risks and benefits of the procedure and the                        sedation options and risks were discussed with the                        patient. All questions were answered and informed                        consent was obtained.                       - ASA Grade Assessment: III - A patient with severe                        systemic disease.                       After obtaining informed consent, the endoscope was                        passed under direct vision. Throughout the procedure,                        the patient's blood pressure, pulse, and oxygen                        saturations were monitored continuously. The Endoscope                        was introduced through the mouth, and advanced to the  third part of duodenum. The upper GI endoscopy was                        accomplished with ease. The patient tolerated the                        procedure well. Findings:      The examined duodenum was normal.      The examined duodenum was  normal. Biopsies for histology were taken with       a cold forceps for evaluation of celiac disease.      The esophagus was normal.      Diffuse moderate inflammation characterized by congestion (edema) and       erythema was found in the entire examined stomach. Biopsies were taken       with a cold forceps for histology. Impression:           - Normal examined duodenum.                       - Normal examined duodenum. Biopsied.                       - Normal esophagus.                       - Gastritis. Biopsied. Recommendation:       - Await pathology results.                       - Perform a colonoscopy today. Procedure Code(s):    --- Professional ---                       [phone], Esophagogastroduodenoscopy, flexible, transoral;                        with biopsy, single or multiple Diagnosis Code(s):    --- Professional ---                       K29.70, Gastritis, unspecified, without bleeding                       R10.13, Epigastric pain CPT copyright 2016 American Medical Association. All rights reserved. The codes documented in this report are preliminary and upon coder review may  be revised to meet current compliance requirements. [name], MD [name] MD, MD 07/16/2016 1:41:07 PM This report has been signed electronically. Number of Addenda: 0 Note Initiated On: 07/16/2016 1:27 PM      [facility]

## 2016-07-17 NOTE — Progress Notes (Signed)
Non-identifying Voicemail.  No message left. 

## 2016-07-19 ENCOUNTER — Encounter: Payer: Self-pay | Admitting: Gastroenterology

## 2016-07-21 LAB — SURGICAL PATHOLOGY

## 2016-07-26 ENCOUNTER — Encounter: Payer: Self-pay | Admitting: Family Medicine

## 2016-07-27 ENCOUNTER — Encounter: Payer: Self-pay | Admitting: Family Medicine

## 2016-07-27 ENCOUNTER — Ambulatory Visit (INDEPENDENT_AMBULATORY_CARE_PROVIDER_SITE_OTHER): Payer: 59 | Admitting: Family Medicine

## 2016-07-27 VITALS — BP 92/60 | HR 52 | Temp 98.0°F | Wt 97.8 lb

## 2016-07-27 DIAGNOSIS — L259 Unspecified contact dermatitis, unspecified cause: Secondary | ICD-10-CM

## 2016-07-27 DIAGNOSIS — J40 Bronchitis, not specified as acute or chronic: Secondary | ICD-10-CM | POA: Diagnosis not present

## 2016-07-27 MED ORDER — CLOTRIMAZOLE-BETAMETHASONE 1-0.05 % EX CREA: 1.0000 "application " | TOPICAL_CREAM | Freq: Two times a day (BID) | CUTANEOUS | 0 refills | Status: DC

## 2016-07-27 MED ORDER — DOXYCYCLINE HYCLATE 100 MG PO TABS: 100.0000 mg | ORAL_TABLET | Freq: Two times a day (BID) | ORAL | 0 refills | Status: DC

## 2016-07-27 NOTE — Progress Notes (Signed)
[name] Female    DOB: March 08, 1955   61 y.o.   MRN: [id number] Visit Date: 07/27/2016  Today's Provider: Vernie Murders, PA   Chief Complaint  Patient presents with  . URI   Subjective:    URI   This is a new problem. Episode onset: Saturday. The problem has been gradually worsening. The maximum temperature recorded prior to her arrival was 100.4 - 100.9 F. Fever duration: this morning. Associated symptoms include congestion and coughing. Associated symptoms comments: Post nasal drainage, and sinus congestion. She has tried decongestant for the symptoms. The treatment provided no relief.   Patient Active Problem List   Diagnosis Date Noted  . Bilateral carpal tunnel syndrome 04/08/2016  . Bilateral hand pain 04/08/2016  . 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 Acute onset aura migraine 07/10/2014  . Bad memory 07/10/2014  . Migraine without aura 07/10/2014  . Abdominal pain, epigastric 07/10/2014  . Chills with fever 07/10/2014  . Chronic constipation 07/10/2014  . Migraines   . Migraine 11/28/2011  . Weakness of left side of body 11/28/2011  . Acute anxiety 11/20/2009  . DEPRESSION 11/20/2009  . FOOT PAIN 11/20/2009  . Hypercholesterolemia without hypertriglyceridemia 06/18/2009  . 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 Malaise and fatigue 10/29/2008  . Abnormal loss of weight 10/29/2008  . 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 Affective bipolar disorder ([place]) 05/17/2008  . Barrett esophagus 05/17/2008   Allergies  Allergen Reactions  . Ciprofloxacin Other (See Comments)  . Codeine Hives and Itching    REACTION: Dizziness  . Diazepam Other (See Comments)    Patient states "it makes me crazy" REACTION: Behavioral problems  . Iodinated Diagnostic Agents Other (See Comments)  . Metoprolol   . Morphine Hives  . Niacin Other (See Comments)  . Niacin And Related Other (See  Comments)    flushing  . Toradol [Ketorolac Tromethamine] Itching    Tolerates when taken with Benadryl     Previous Medications   ALBUTEROL (PROVENTIL HFA;VENTOLIN HFA) 108 (90 BASE) MCG/ACT INHALER    Inhale 2 puffs into the lungs every 6 (six) hours as needed for wheezing or shortness of breath.   ALPRAZOLAM (XANAX) 0.5 MG TABLET    Take 1 tablet (0.5 mg total) by mouth 3 (three) times daily as needed for anxiety.   BACLOFEN (LIORESAL) 10 MG TABLET    TAKE 1 TABLET (10 MG TOTAL) BY MOUTH 2 (TWO) TIMES DAILY AS NEEDED.   GABAPENTIN (NEURONTIN) 300 MG CAPSULE    TAKE 1 CAPSULE (300 MG TOTAL) BY MOUTH NIGHTLY.   HYDROCODONE-ACETAMINOPHEN (NORCO/VICODIN) 5-325 MG TABLET    Take 1 tablet by mouth every 6 (six) hours as needed. For pain   METOPROLOL TARTRATE (LOPRESSOR) 25 MG TABLET    Take by mouth.   PANTOPRAZOLE (PROTONIX) 40 MG TABLET    TAKE 1 TABLET (40 MG TOTAL) BY MOUTH DAILY.   POLYETHYLENE GLYCOL POWDER (GLYCOLAX/MIRALAX) POWDER    Take 17 g by mouth daily.   QUETIAPINE (SEROQUEL) 50 MG TABLET    Take 1 tablet (50 mg total) by mouth at bedtime.   SIMVASTATIN (ZOCOR) 40 MG TABLET    TAKE 1 TABLET BY MOUTH NIGHTLY AT BEDTIME    Review of Systems  Constitutional: Negative.   HENT: Positive for congestion and postnasal drip.   Respiratory: Positive for cough.   Cardiovascular: Negative.     Social History  Substance Use Topics  .  Smoking status: Current Every Day Smoker    Packs/day: 0.50    Years: 30.00    Types: Cigarettes  . Smokeless tobacco: Never Used  . Alcohol use 0.0 oz/week     Comment: occasional   Objective:   BP 92/60 (BP Location: Right Arm, Patient Position: Sitting, Cuff Size: Normal)   Pulse (!) 52   Temp 98 F (36.7 C) (Oral)   Wt 97 lb 12.8 oz (44.4 kg)   SpO2 99%   BMI 19.10 kg/m   Physical Exam  Constitutional: She is oriented to person, place, and time. She appears well-developed and well-nourished. No distress.  HENT:  Head: Normocephalic and  atraumatic.  Right Ear: Hearing and external ear normal.  Left Ear: Hearing and external ear normal.  Nose: Nose normal.  Hazy TM's -( R>L).  Eyes: Conjunctivae and lids are normal. Right eye exhibits no discharge. Left eye exhibits no discharge. No scleral icterus.  Neck: Neck supple.  Cardiovascular: Normal rate and regular rhythm.   Pulmonary/Chest: Effort normal and breath sounds normal. No respiratory distress.  Slight rhonchi. No wheeze or rales.  Musculoskeletal: Normal range of motion.  Neurological: She is alert and oriented to person, place, and time.  Skin: Skin is intact. Rash noted. No lesion noted.  Erythematous lesions on right foot (one is 1 x 0.5 cm and one is 2.5 x 2 cm on the dorsum of the foot). Not pruritic or drainage. Some discomfort to palpate an radiates to the lower leg without rash in that area.  Psychiatric: She has a normal mood and affect. Her speech is normal and behavior is normal. Thought content normal.      Assessment & Plan:     1. Bronchitis Onset over the past 3-4 days with some fever occasionally. Still smoking but working on a quit date with husband. May use Mucinex-DM and add Doxycycline for atypical bronchitis. Increase fluids and may use Loratadine 10 mg qd for rhinitis. Recheck prn. - doxycycline (VIBRA-TABS) 100 MG tablet; Take 1 tablet (100 mg total) by mouth 2 (two) times daily.  Dispense: 20 tablet; Refill: 0  2. Contact dermatitis, unspecified contact dermatitis type, unspecified trigger Onset over the past week. May have been poison ivy exposure. No itching. No lymphangitis or drainage. Will treat with Lotrisone cream BID and recheck prn. - clotrimazole-betamethasone (LOTRISONE) cream; Apply 1 application topically 2 (two) times daily.  Dispense: 30 g; Refill: 0

## 2016-08-01 ENCOUNTER — Encounter: Payer: Self-pay | Admitting: Gastroenterology

## 2016-08-02 ENCOUNTER — Other Ambulatory Visit: Payer: Self-pay

## 2016-08-02 ENCOUNTER — Ambulatory Visit (INDEPENDENT_AMBULATORY_CARE_PROVIDER_SITE_OTHER): Payer: 59 | Admitting: Gastroenterology

## 2016-08-02 ENCOUNTER — Encounter: Payer: Self-pay | Admitting: Gastroenterology

## 2016-08-02 ENCOUNTER — Other Ambulatory Visit: Payer: Self-pay | Admitting: Family Medicine

## 2016-08-02 VITALS — BP 122/72 | HR 60 | Temp 98.5°F | Ht 60.0 in | Wt 95.6 lb

## 2016-08-02 DIAGNOSIS — R634 Abnormal weight loss: Secondary | ICD-10-CM

## 2016-08-02 NOTE — Progress Notes (Signed)
[name] MD, MRCP(U.K) [address]  [place]  [place], [place] [address]  Main: [phone]  Fax: [phone]   Primary Care Physician: Margo Common, [place]  Primary Gastroenterologist:  Dr. [name]   No chief complaint on file.   HPI: Jordan Ortega is a 61 y.o. female   Summary of history :  She is being followed for diarrhea .Initial visit on 07/14/16 .She has had diarrhea for about 6 weeks. She has "diarrhea" once very 2-3 days with some blood, when she wipes on top of the stool. Initially hard "balls" then very watery. At times when she has a bowel movement it hurts but only passes mucus. Father had colon cancer, last colonoscopy per patient in 2011 and recalls she had polyps which were taken out by Dr Gustavo Lah. She was advised to return in 5 years. She lost 16 lbs over 6 weeks. When she eats has lower abdominal pain , usually begins in an hour, lasts for about a few hours.   Labs 07/2016- normal CMP,CBC. Denies any NSAID use.   Interval history   07/14/2016-  08/02/2016   EGD 07/2016- endoscopically gastritis seen. small bowel bx - normal , gastric bx -moderate gastropathy  Colonoscopy 07/2016 - hyperplastic polyp, random colon bx negative for colitis. Medium sized internal hemorroids seen.   Taking miralax- having a bowel movement daily , soft , no rectal bleeding except on an off occasion . Eating a "lot more " lost 3 lbs since last visit. Feels a bit weak. In march was 107 lbs. No diarrhea.   BP 122/72 (BP Location: Right Arm, Patient Position: Sitting, Cuff Size: Normal)   Pulse 60   Temp 98.5 F (36.9 C) (Oral)   Ht 5' (1.524 m)   Wt 95 lb 9.6 oz (43.4 kg)   BMI 18.67 kg/m   Current Outpatient Prescriptions  Medication Sig Dispense Refill  . albuterol (PROVENTIL HFA;VENTOLIN HFA) 108 (90 Base) MCG/ACT inhaler Inhale 2 puffs into the lungs every 6 (six) hours as needed for wheezing or shortness of breath. 1 Inhaler 2  . ALPRAZolam (XANAX) 0.5 MG  tablet Take 1 tablet (0.5 mg total) by mouth 3 (three) times daily as needed for anxiety. 90 tablet 0  . baclofen (LIORESAL) 10 MG tablet TAKE 1 TABLET (10 MG TOTAL) BY MOUTH 2 (TWO) TIMES DAILY AS NEEDED.  2  . clotrimazole-betamethasone (LOTRISONE) cream Apply 1 application topically 2 (two) times daily. 30 g 0  . doxycycline (VIBRA-TABS) 100 MG tablet Take 1 tablet (100 mg total) by mouth 2 (two) times daily. 20 tablet 0  . gabapentin (NEURONTIN) 300 MG capsule TAKE 1 CAPSULE (300 MG TOTAL) BY MOUTH NIGHTLY.  3  . HYDROcodone-acetaminophen (NORCO/VICODIN) 5-325 MG tablet Take 1 tablet by mouth every 6 (six) hours as needed. For pain 30 tablet 0  . metoprolol tartrate (LOPRESSOR) 25 MG tablet Take by mouth.    . pantoprazole (PROTONIX) 40 MG tablet TAKE 1 TABLET (40 MG TOTAL) BY MOUTH DAILY. 90 tablet 3  . polyethylene glycol powder (GLYCOLAX/MIRALAX) powder Take 17 g by mouth daily. 255 g 3  . QUEtiapine (SEROQUEL) 50 MG tablet Take 1 tablet (50 mg total) by mouth at bedtime. (Patient taking differently: Take 75 mg by mouth at bedtime. ) 90 tablet 3  . simvastatin (ZOCOR) 40 MG tablet TAKE 1 TABLET BY MOUTH NIGHTLY AT BEDTIME 30 tablet 5   No current facility-administered medications for this visit.     Allergies as  of 08/02/2016 - Review Complete 07/27/2016  Allergen Reaction Noted  . Ciprofloxacin Other (See Comments) 07/11/2014  . Codeine Hives and Itching 11/20/2009  . Diazepam Other (See Comments) 11/20/2009  . Iodinated diagnostic agents Other (See Comments) 07/11/2014  . Metoprolol  07/10/2014  . Morphine Hives 11/20/2009  . Niacin Other (See Comments) 07/11/2014  . Niacin and related Other (See Comments) 11/28/2011  . Toradol [ketorolac tromethamine] Itching 07/10/2014    ROS:  General: Negative for anorexia, weight loss, fever, chills, fatigue, weakness. ENT: Negative for hoarseness, difficulty swallowing , nasal congestion. CV: Negative for chest pain, angina,  palpitations, dyspnea on exertion, peripheral edema.  Respiratory: Negative for dyspnea at rest, dyspnea on exertion, cough, sputum, wheezing.  GI: See history of present illness. GU:  Negative for dysuria, hematuria, urinary incontinence, urinary frequency, nocturnal urination.  Endo: Negative for unusual weight change.    Physical Examination:   There were no vitals taken for this visit.  General: Well-nourished, well-developed in no acute distress.  Eyes: No icterus. Conjunctivae pink. Mouth: Oropharyngeal mucosa moist and pink , no lesions erythema or exudate. Lungs: Clear to auscultation bilaterally. Non-labored. Heart: Regular rate and rhythm, no murmurs rubs or gallops.  Abdomen: Bowel sounds are normal, nontender, nondistended, no hepatosplenomegaly or masses, no abdominal bruits or hernia , no rebound or guarding.   Extremities: No lower extremity edema. No clubbing or deformities. Neuro: Alert and oriented x 3.  Grossly intact. Skin: Warm and dry, no jaundice.   Psych: Alert and cooperative, normal mood and affect.   Imaging Studies: Dg Abd 1 View  Result Date: 07/14/2016 CLINICAL DATA:  Diarrhea and weight loss EXAM: ABDOMEN - 1 VIEW COMPARISON:  None. FINDINGS: Scattered large and small bowel gas is noted. Fecal material is noted throughout colon consistent with a mild degree of constipation. No abnormal mass or abnormal calcifications are noted. Degenerative changes of lumbar spine are noted. IMPRESSION: Mild constipation. Electronically Signed   By: Inez Catalina M.D.   On: 07/14/2016 15:55    Assessment and Plan:   Jordan Ortega is a 61 y.o. y/o female here to follow up for diarrhea/rectal bleeding which have resolved. Rectal bleeding likely secondary to constipation which has resolved with daily miralax . She has lost weight unintentionally about 14 lbs per EPIC over last 3 months .   Plan  1. CT chest/abdomen/pelvis for unintentional weight loss  Dr [name]   MD,MRCP [facility]) Follow up in 4 weeks.  Marland Kitchen

## 2016-08-02 NOTE — Patient Instructions (Signed)
You have been scheduled for a CT of the Chest, Abd and Pelvis in regards to your unintentional weightloss. Your CT has been scheduled for Monday at 2:45 pm.  The location is  [place]..  Plkease pick up prep before hand.  Nothing to eat or drink 4 hours prior.  And follow up in 4 weeks.

## 2016-08-09 ENCOUNTER — Ambulatory Visit
Admission: RE | Admit: 2016-08-09 | Discharge: 2016-08-09 | Disposition: A | Discharge status: Home / Self Care | Payer: 59 | Source: Ambulatory Visit | Attending: Gastroenterology | Admitting: Gastroenterology

## 2016-08-09 ENCOUNTER — Other Ambulatory Visit: Payer: Self-pay | Admitting: Gastroenterology

## 2016-08-09 DIAGNOSIS — Z90710 Acquired absence of both cervix and uterus: Secondary | ICD-10-CM | POA: Insufficient documentation

## 2016-08-09 DIAGNOSIS — R634 Abnormal weight loss: Secondary | ICD-10-CM

## 2016-08-09 DIAGNOSIS — R1900 Intra-abdominal and pelvic swelling, mass and lump, unspecified site: Secondary | ICD-10-CM | POA: Insufficient documentation

## 2016-08-10 ENCOUNTER — Telehealth: Payer: Self-pay

## 2016-08-10 ENCOUNTER — Other Ambulatory Visit: Payer: Self-pay

## 2016-08-10 DIAGNOSIS — R1900 Intra-abdominal and pelvic swelling, mass and lump, unspecified site: Secondary | ICD-10-CM

## 2016-08-10 NOTE — Telephone Encounter (Signed)
Advised patient of results per Dr. Vicente Males.   Scheduling Pelvic US due to pelvic mass.

## 2016-08-13 ENCOUNTER — Ambulatory Visit
Admission: RE | Admit: 2016-08-13 | Discharge: 2016-08-13 | Disposition: A | Discharge status: Home / Self Care | Payer: 59 | Source: Ambulatory Visit | Attending: Gastroenterology | Admitting: Gastroenterology

## 2016-08-13 DIAGNOSIS — R1900 Intra-abdominal and pelvic swelling, mass and lump, unspecified site: Secondary | ICD-10-CM

## 2016-08-13 DIAGNOSIS — Z90710 Acquired absence of both cervix and uterus: Secondary | ICD-10-CM | POA: Diagnosis not present

## 2016-08-13 DIAGNOSIS — R1909 Other intra-abdominal and pelvic swelling, mass and lump: Secondary | ICD-10-CM | POA: Diagnosis not present

## 2016-08-16 ENCOUNTER — Telehealth: Payer: Self-pay | Admitting: Family Medicine

## 2016-08-16 ENCOUNTER — Telehealth: Payer: Self-pay | Admitting: Gastroenterology

## 2016-08-16 ENCOUNTER — Other Ambulatory Visit: Payer: Self-pay | Admitting: Family Medicine

## 2016-08-16 DIAGNOSIS — N9489 Other specified conditions associated with female genital organs and menstrual cycle: Secondary | ICD-10-CM

## 2016-08-16 NOTE — Telephone Encounter (Signed)
Patient called for u/s results please call her on her cell phone as she will not be at home today. Thanks!

## 2016-08-16 NOTE — Progress Notes (Signed)
Will schedule referral with GYN.

## 2016-08-16 NOTE — Telephone Encounter (Signed)
Patient is aware. Referral in EPIC

## 2016-08-16 NOTE — Telephone Encounter (Signed)
-----   Message from Margo Common, [place] sent at 08/16/2016  7:55 AM EDT ----- Advise patient we need to schedule GYN referral to evaluate right adnexal/ovarian mass found on ultrasound and CT scan.

## 2016-08-16 NOTE — Telephone Encounter (Signed)
Advised patient of results per Dr. Vicente Males.  Informed patient to contact Dr. Natale Milch. Follow-up will be from [facility]. GYN referral in process.  Mass found on ovary that may require surgery.   Patient to contact Dr. Natale Milch for follow-up.

## 2016-08-16 NOTE — Telephone Encounter (Signed)
Pt states per Dr Vicente Males she is needing a referral to a OBGYN due to a pelvic mass.  ZD#[phone]/[id number]

## 2016-08-16 NOTE — Progress Notes (Signed)
Advise patient we need to schedule GYN referral to evaluate right adnexal/ovarian mass found on ultrasound and CT scan.

## 2016-08-17 ENCOUNTER — Encounter: Payer: Self-pay | Admitting: Obstetrics & Gynecology

## 2016-08-17 ENCOUNTER — Ambulatory Visit (INDEPENDENT_AMBULATORY_CARE_PROVIDER_SITE_OTHER): Payer: BC Managed Care – PPO | Admitting: Obstetrics & Gynecology

## 2016-08-17 VITALS — BP 110/70 | HR 59 | Ht 61.0 in | Wt 96.0 lb

## 2016-08-17 DIAGNOSIS — N949 Unspecified condition associated with female genital organs and menstrual cycle: Secondary | ICD-10-CM | POA: Diagnosis not present

## 2016-08-17 DIAGNOSIS — N9489 Other specified conditions associated with female genital organs and menstrual cycle: Secondary | ICD-10-CM | POA: Insufficient documentation

## 2016-08-17 NOTE — Progress Notes (Signed)
HPI: Patient is a 61 y.o. [id number] who has had TAH in 1999, presents today for a problem visit.  She complains of recent findings of Right adnexal mass by Ultrasound - Pelvic Vaginal, CT - Pelvis.  Pt has had symptoms of none, she was seen by PCP and GI for 30 lb weight loss over 3 mos and diarrhea.  No pelvic pain, bloating, weight gain.  No VB.  No prior h/o ovarian concerns; hyst was for bleeeding in her 28s..  Previous evaluation: office visits by PCP. Prior Diagnosis: no prior hx. Previous Treatment: none.  PMHx: She  has a past medical history of Barrett's esophagus; Bell palsy; and Migraines. Also,  has a past surgical history that includes Abdominal hysterectomy; Cholecystectomy; Tubal ligation (1983); Gallbladder surgery (1980); Colonoscopy with propofol (N/A, 07/16/2016); and Esophagogastroduodenoscopy (egd) with propofol (N/A, 07/16/2016)., family history includes Breast cancer in her mother; Colon cancer in her father; Dementia in her paternal grandfather; Diabetes in her paternal grandmother; Hypertension in her paternal grandmother; Lung cancer in her father; Migraines in her sister; Stroke in her paternal grandmother.,  reports that she has been smoking Cigarettes.  She has a 15.00 pack-year smoking history. She has never used smokeless tobacco. She reports that she drinks alcohol. She reports that she does not use drugs.  She has a current medication list which includes the following prescription(s): albuterol, alprazolam, baclofen, clotrimazole-betamethasone, doxycycline, gabapentin, hydrocodone-acetaminophen, metoprolol tartrate, pantoprazole, polyethylene glycol powder, quetiapine, and simvastatin. Also, is allergic to ciprofloxacin; codeine; diazepam; morphine; niacin; niacin and related; toradol [ketorolac tromethamine]; and iodinated diagnostic agents.  Review of Systems  Constitutional: Negative for chills, fever and malaise/fatigue.  HENT: Negative for congestion, sinus pain and  sore throat.   Eyes: Negative for blurred vision and pain.  Respiratory: Negative for cough and wheezing.   Cardiovascular: Negative for chest pain and leg swelling.  Gastrointestinal: Negative for abdominal pain, constipation, diarrhea, heartburn, nausea and vomiting.  Genitourinary: Negative for dysuria, frequency, hematuria and urgency.  Musculoskeletal: Negative for back pain, joint pain, myalgias and neck pain.  Skin: Negative for itching and rash.  Neurological: Negative for dizziness, tremors and weakness.  Endo/Heme/Allergies: Does not bruise/bleed easily.  Psychiatric/Behavioral: Negative for depression. The patient is not nervous/anxious and does not have insomnia.     Objective: BP 110/70   Pulse (!) 59   Ht 5\' 1"  (1.549 m)   Wt 96 lb (43.5 kg)   BMI 18.14 kg/m  Physical Exam  Constitutional: She is oriented to person, place, and time. She appears well-developed and well-nourished. No distress.  Genitourinary: Vagina normal. Pelvic exam was performed with patient supine. There is no rash, tenderness or lesion on the right labia. There is no rash, tenderness or lesion on the left labia. No erythema or bleeding in the vagina.  Genitourinary Comments: Cuff intact/ no lesions Absent uterus and cervix Post pelvic mass soft mobile mildly T;  No LAN  Abdominal: Soft. She exhibits no distension. There is tenderness in the right lower quadrant.  Musculoskeletal: Normal range of motion.  Neurological: She is alert and oriented to person, place, and time. No cranial nerve deficit.  Skin: Skin is warm and dry.  Psychiatric: She has a normal mood and affect.    ASSESSMENT/PLAN:    Problem List Items Addressed This Visit      Other   Adnexal mass - Primary   Relevant Orders   CA 125   Ambulatory referral to Gynecologic Oncology    Options for surgery discussed.  Cancer will affect how much surgery; basic would be Lap BSO, but if cancer then further surgery w Gyn Onc  assistance.  Barnett Applebaum, MD, [facility], [facility] 08/17/2016  12:03 PM

## 2016-08-17 NOTE — Patient Instructions (Signed)
CA-125 Tumor Marker Test Why am I having this test? This test is used to check the level of cancer antigen 125 (CA-125) in your blood. The CA-125 tumor marker test can be helpful in detecting ovarian cancer. The test is only performed if you are considered at high risk for ovarian cancer. Your health care provider may recommend this test if:  You have a strong family history of ovarian cancer.  You have a breast cancer antigen (BRCA) genetic defect.  If you have already been diagnosed with ovarian cancer, your health care provider may use this test to help identify the extent of the disease and to monitor your response to treatment. What kind of sample is taken? A blood sample is required for this test. It is usually collected by inserting a needle into a vein. How do I prepare for this test? There is no preparation required for this test. What are the reference ranges? Reference ranges are considered healthy ranges established after testing a large group of healthy people. Reference ranges may vary among different people, labs, and hospitals. It is your responsibility to obtain your test results. Ask the lab or department performing the test when and how you will get your results. The reference range for this test is 0-35 units/mL or less than 35 kunits/L (SI units). What do the results mean? Increased levels of CA-125 may indicate:  Certain types of cancer, including: ? Ovarian cancer. ? Pancreatic cancer. ? Colon cancer. ? Lung cancer. ? Breast cancer. ? Lymphoma.  Noncancerous (benign) disorders, including: ? Cirrhosis. ? Pregnancy. ? Endometriosis. ? Pancreatitis. ? Pelvic inflammatory disease (PID).  Talk with your health care provider to discuss your results, treatment options, and if necessary, the need for more tests. Talk with your health care provider if you have any questions about your results. Talk with your health care provider to discuss your results, treatment  options, and if necessary, the need for more tests. Talk with your health care provider if you have any questions about your results. This information is not intended to replace advice given to you by your health care provider. Make sure you discuss any questions you have with your health care provider. Document Released: 02/10/2004 Document Revised: 09/23/2015 Document Reviewed: 06/07/2013 Elsevier Interactive Patient Education  [employer].

## 2016-08-18 ENCOUNTER — Telehealth: Payer: Self-pay | Admitting: Obstetrics & Gynecology

## 2016-08-18 ENCOUNTER — Other Ambulatory Visit: Payer: Self-pay | Admitting: *Deleted

## 2016-08-18 ENCOUNTER — Encounter: Payer: Self-pay | Admitting: Obstetrics and Gynecology

## 2016-08-18 ENCOUNTER — Inpatient Hospital Stay: Payer: BC Managed Care – PPO | Attending: Obstetrics and Gynecology | Admitting: Obstetrics and Gynecology

## 2016-08-18 VITALS — BP 98/64 | HR 57 | Temp 98.4°F | Resp 18 | Ht 61.0 in | Wt 96.6 lb

## 2016-08-18 DIAGNOSIS — Z79899 Other long term (current) drug therapy: Secondary | ICD-10-CM

## 2016-08-18 DIAGNOSIS — R634 Abnormal weight loss: Secondary | ICD-10-CM | POA: Diagnosis not present

## 2016-08-18 DIAGNOSIS — F419 Anxiety disorder, unspecified: Secondary | ICD-10-CM | POA: Diagnosis not present

## 2016-08-18 DIAGNOSIS — Z90710 Acquired absence of both cervix and uterus: Secondary | ICD-10-CM

## 2016-08-18 DIAGNOSIS — K2270 Barrett's esophagus without dysplasia: Secondary | ICD-10-CM

## 2016-08-18 DIAGNOSIS — E7800 Pure hypercholesterolemia, unspecified: Secondary | ICD-10-CM | POA: Diagnosis not present

## 2016-08-18 DIAGNOSIS — N83201 Unspecified ovarian cyst, right side: Secondary | ICD-10-CM | POA: Diagnosis not present

## 2016-08-18 DIAGNOSIS — F17210 Nicotine dependence, cigarettes, uncomplicated: Secondary | ICD-10-CM

## 2016-08-18 DIAGNOSIS — F319 Bipolar disorder, unspecified: Secondary | ICD-10-CM

## 2016-08-18 LAB — CA 125: CANCER ANTIGEN (CA) 125: 11.9 U/mL (ref 0.0–38.1)

## 2016-08-18 NOTE — Progress Notes (Signed)
Gynecologic Oncology Consult Visit   Referring Provider: Dr. Barnett Applebaum  Chief Concern: right adnexal mass  Subjective:  Jordan Ortega is a 61 y.o. female who is seen in consultation from Dr. Kenton Kingfisher for right adnexal mass.   Jordan Ortega is s/p TAH in 1999 (benign disease, ovaries in situ), and presented with 30 pound weight loss over 3 months. Evaluation included Ultrasound - Pelvic Vaginal and CT scan. Pelvic exam by Dr. Kenton Kingfisher demonstrated RLQ tenderson. She does not have abdominal or pelvic pain.   08/09/2016 CT scan  IMPRESSION: 7 x 6 cm cystic mass in the cul-de-sac. This is presumably ovarian in origin. This could be further characterized and evaluated with pelvic ultrasound. Cannot exclude ovarian cystic neoplasm, benign or Malignant.  08/13/2016 Pelvic Ultrasound Right ovary: Within the right adnexal region there is a cystic mass with internal echoes measuring 7.6 x 4.5 x 7.8 cm. There appears to be ovarian tissue surrounding the mass. Small mural nodule is identified along the inferior aspect of the mass, associated with internal vascularity and measuring 0.7 x 1.1 cm.  Left ovary: 1.5 x 1.6 x 1.7 cm. Normal appearance/no adnexal mass. Punctate echogenic foci are identified within the left ovary, a variation of normal.  No abnormal free fluid.  IMPRESSION: 1. Right adnexal cystic mass with internal echoes and a vascular mural nodule. Findings are suspicious for ovarian neoplasm. Given the large size of the mass, the lesion may be better characterized with MRI. Consider MRI prior to surgery. 2. Status post hysterectomy. 3. Normal appearance of the left ovary. 4. No ascites.  CA125 = 11.9  She presents today for evaluation.   Problem List: Patient Active Problem List   Diagnosis Date Noted  . Cyst of right ovary 08/18/2016  . Adnexal mass 08/17/2016  . Bilateral carpal tunnel syndrome 04/08/2016  . Bilateral hand pain 04/08/2016  . 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 Acute onset aura migraine 07/10/2014  . Bad memory 07/10/2014  . Migraine without aura 07/10/2014  . Abdominal pain, epigastric 07/10/2014  . Chills with fever 07/10/2014  . Chronic constipation 07/10/2014  . Migraines   . Migraine 11/28/2011  . Weakness of left side of body 11/28/2011  . Acute anxiety 11/20/2009  . DEPRESSION 11/20/2009  . FOOT PAIN 11/20/2009  . Hypercholesterolemia without hypertriglyceridemia 06/18/2009  . 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 Malaise and fatigue 10/29/2008  . Abnormal loss of weight 10/29/2008  . 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 Affective bipolar disorder ([place]) 05/17/2008  . Barrett esophagus 05/17/2008    Past Medical History: Past Medical History:  Diagnosis Date  . Adnexal mass   . Barrett's esophagus   . Bell palsy    left foot also ,   . Degenerative disc disease, cervical   . High triglycerides   . Migraines   . Migraines     Past Surgical History: Past Surgical History:  Procedure Laterality Date  . ABDOMINAL HYSTERECTOMY    . APPENDECTOMY    . CHOLECYSTECTOMY    . COLONOSCOPY WITH PROPOFOL N/A 07/16/2016   Procedure: COLONOSCOPY WITH PROPOFOL;  Surgeon: [name], MD;  Location: [facility] ENDOSCOPY;  Service: Endoscopy;  Laterality: N/A;  . ESOPHAGOGASTRODUODENOSCOPY (EGD) WITH PROPOFOL N/A 07/16/2016   Procedure: ESOPHAGOGASTRODUODENOSCOPY (EGD) WITH PROPOFOL;  Surgeon: [name], MD;  Location: [facility] ENDOSCOPY;  Service: Endoscopy;  Laterality: N/A;  . [place]  . [place]  Past Gynecologic History:  As per HPI  OB History:  OB History  Gravida Para Term Preterm AB Living  5 3 3   2 3   SAB TAB Ectopic Multiple Live Births    2          # Outcome Date GA Lbr Len/2nd Weight Sex Delivery Anes PTL Lv  5 TAB           4 TAB           3 Term           2 Term           1 Term                Family History: Family History  Problem Relation Age of Onset  . Breast cancer Mother   . Skin cancer Mother   . Hypothyroidism Mother   . Migraines Sister   . Colon cancer Father   . Lung cancer Father   . Heart disease Father   . Diabetes Paternal Grandmother   . Hypertension Paternal Grandmother   . Stroke Paternal Grandmother   . Dementia Paternal Grandfather   . Breast cancer Maternal Aunt   . Diabetes Paternal Uncle   . Breast cancer Cousin     Social History: Social History   Social History  . Marital status: Married    Spouse name: N/A  . Number of children: N/A  . Years of education: N/A   Occupational History  . Not on file.   Social History Main Topics  . Smoking status: Current Every Day Smoker    Packs/day: 0.50    Years: 30.00    Types: Cigarettes  . Smokeless tobacco: Never Used  . Alcohol use 0.0 oz/week     Comment: occasional  . Drug use: No  . Sexual activity: Not on file   Other Topics Concern  . Not on file   Social History Narrative  . No narrative on file    Allergies: Allergies  Allergen Reactions  . Ciprofloxacin Other (See Comments)  . Codeine Hives and Itching    REACTION: Dizziness  . Diazepam Other (See Comments)    Patient states "it makes me crazy" REACTION: Behavioral problems  . Morphine Hives  . Niacin Other (See Comments)  . Niacin And Related Other (See Comments)    flushing  . Toradol [Ketorolac Tromethamine] Itching    Tolerates when taken with Benadryl  . Iodinated Diagnostic Agents Hives    Pt states she had a CT Head with contrast in the 90's and broke out in hives after the injection. SPM    Current Medications: Current Outpatient Prescriptions  Medication Sig Dispense Refill  . albuterol (PROVENTIL HFA;VENTOLIN HFA) 108 (90 Base) MCG/ACT inhaler Inhale 2 puffs into the lungs every 6 (six) hours as needed for wheezing or shortness of breath. 1 Inhaler 2  . ALPRAZolam (XANAX) 0.5 MG tablet Take 1  tablet (0.5 mg total) by mouth 3 (three) times daily as needed for anxiety. 90 tablet 0  . baclofen (LIORESAL) 10 MG tablet TAKE 1 TABLET (10 MG TOTAL) BY MOUTH 2 (TWO) TIMES DAILY AS NEEDED.  2  . gabapentin (NEURONTIN) 300 MG capsule TAKE 1 CAPSULE (300 MG TOTAL) BY MOUTH NIGHTLY.  3  . HYDROcodone-acetaminophen (NORCO/VICODIN) 5-325 MG tablet Take 1 tablet by mouth every 6 (six) hours as needed. For pain 30 tablet 0  . metoprolol tartrate (LOPRESSOR) 25 MG tablet Take by mouth.    Marland Kitchen  pantoprazole (PROTONIX) 40 MG tablet TAKE 1 TABLET (40 MG TOTAL) BY MOUTH DAILY. 90 tablet 3  . polyethylene glycol powder (GLYCOLAX/MIRALAX) powder Take 17 g by mouth daily. 255 g 3  . Probiotic Product (PROBIOTIC-10) CHEW Chew 2 tablets by mouth daily.    . QUEtiapine (SEROQUEL) 50 MG tablet Take 1 tablet (50 mg total) by mouth at bedtime. (Patient taking differently: Take 75 mg by mouth at bedtime. ) 90 tablet 3  . simvastatin (ZOCOR) 40 MG tablet TAKE 1 TABLET BY MOUTH NIGHTLY AT BEDTIME 30 tablet 5   No current facility-administered medications for this visit.     Review of Systems General: no complaints  HEENT: no complaints  Lungs: no complaints  Cardiac: no complaints  GI: no complaints  GU: no complaints  Musculoskeletal: no complaints  Extremities: no complaints  Skin: no complaints  Neuro: no complaints  Endocrine: no complaints  Psych: no complaints       Objective:  Physical Examination:  BP 98/64   Pulse (!) 57   Temp 98.4 F (36.9 C) (Tympanic)   Resp 18   Ht 5\' 1"  (1.549 m)   Wt 96 lb 9.6 oz (43.8 kg)   BMI 18.25 kg/m    ECOG Performance Status: 0 - Asymptomatic  General appearance: alert, cooperative and appears stated age HEENT:PERRLA, extra ocular movement intact and sclera clear, anicteric Lymph node survey: non-palpable, axillary, inguinal, supraclavicular Cardiovascular: regular rate and rhythm Respiratory: normal air entry, lungs clear to auscultation Abdomen:  soft, non-tender, without masses or organomegaly, no hernias and well healed incision. No ascites. Back: inspection of back is normal Extremities: extremities normal, atraumatic, no cyanosis or edema Skin exam - normal coloration and turgor, no rashes, no suspicious skin lesions noted. Neurological exam reveals alert, oriented, normal speech, no focal findings or movement disorder noted.  Pelvic: exam chaperoned by nurse;  Vulva: normal appearing vulva with no masses, tenderness or lesions; Vagina: normal vagina; Adnexa: tenderness right mass present right side, size 8 cm with 1 cm palpable nodule at the vaginal cuff, otherwise the mass has a smooth surface, limited mobility but not fixed; Uterus: surgically absent, vaginal cuff well healed; Cervix: absent; Rectal: mass palpated as above but does not impinge on the rectum    Lab Review Labs on site today: n/a  Radiologic Imaging: I personally reviewed ultrasound    Assessment:  Vi Biddinger is a 61 y.o. female diagnosed with asymptomatic right adnexal mass (essentially simple except for 1 cm nodule), normal CA125. Weight loss concerning for malignancy, but cyst features and CA125 is reassuring.  Medical co-morbidities complicating care: prior abdominal surgery.  Plan:   Problem List Items Addressed This Visit      Genitourinary   Cyst of right ovary - Primary      We discussed options for management. I think she has low risk for malignancy. I reviewed that CA125 can be normal in 50% of early stage ovarian and 20% late stage ovarian cancers. She will plan to have surgery with Dr. Kenton Kingfisher for laparoscopic BSO. We hopefully can coordinate on a day that we can be available if frozen section positive for malignancy. But I do think the risk of malignancy is low enough that Dr. Kenton Kingfisher can proceed on a day we are not available.   Risks of staging procedure were discussed in detail. These include infection, anesthesia, bleeding,  transfusion, wound separation, medical issues (blood clots, stroke, heart attack, fluid in the lungs, pneumonia, abnormal heart rhythm,  death), possible exploratory surgery with larger incision, lymphedema, lymphocyst, allergic reaction, persistent scar tissue and pain.     The patient's diagnosis, an outline of the further diagnostic and laboratory studies which will be required, the recommendation, and alternatives were discussed.  All questions were answered to the patient's satisfaction.     Gillis Ends, MD    CC:  Dr. Barnett Applebaum, MD

## 2016-08-18 NOTE — Telephone Encounter (Signed)
Pt is calling this morning about an missed call, please call patient.

## 2016-08-19 ENCOUNTER — Telehealth: Payer: Self-pay | Admitting: Obstetrics & Gynecology

## 2016-08-19 NOTE — Telephone Encounter (Signed)
Pt is being referred by [facility] for Adnexal mass. Called and left voicemail for patient to be schedule

## 2016-08-20 NOTE — Telephone Encounter (Signed)
Pt returned call. CB# [phone] Thanks TNP

## 2016-08-20 NOTE — Telephone Encounter (Signed)
Patient is aware of H&P on 08/24/16 @ 2:50pm w/ Dr. Kenton Kingfisher, Pre-admit Testing on 08/25/16 @ 2pm, and OR on 08/27/16. Patient was given my ext.

## 2016-08-20 NOTE — Telephone Encounter (Signed)
Lmtrc at both phone#s.

## 2016-08-20 NOTE — Telephone Encounter (Signed)
-----   Message from Gae Dry, MD sent at 08/20/2016 12:04 AM EDT ----- Regarding: RE: surgery FRI I am in office Tues, Wed, Thurs.  ----- Message ----- From: Alexandria Lodge Sent: 08/19/2016   1:36 PM To: Gae Dry, MD Subject: RE: surgery FRI                                You are not in the office between now and 08/27/16 to do the H&P. Do you want to get consents day of surgery?  ----- Message ----- From: Gae Dry, MD Sent: 08/19/2016  10:45 AM To: Alexandria Lodge Subject: surgery FRI                                    Surgery Booking Request Patient Full Name:   MRN: [id number]  DOB: 1955/02/18  Surgeon: Hoyt Koch, MD  Requested Surgery Date and Time: 08/27/16 - Friday - anytime available Primary Diagnosis AND Code: Right Ovarian Cyst Secondary Diagnosis and Code:  Surgical Procedure: Laparoscopy with BSO L&D Notification: No Admission Status: same day surgery Length of Surgery: 1 hour Special Case Needs: no H&P: yes (date) Phone Interview???: no Interpreter: Language:  Medical Clearance: no Special Scheduling Instructions: no

## 2016-08-24 ENCOUNTER — Encounter: Payer: Self-pay | Admitting: Obstetrics & Gynecology

## 2016-08-24 ENCOUNTER — Ambulatory Visit (INDEPENDENT_AMBULATORY_CARE_PROVIDER_SITE_OTHER): Payer: BC Managed Care – PPO | Admitting: Obstetrics & Gynecology

## 2016-08-24 VITALS — BP 90/60 | HR 55 | Ht 60.0 in | Wt 95.0 lb

## 2016-08-24 DIAGNOSIS — R1900 Intra-abdominal and pelvic swelling, mass and lump, unspecified site: Secondary | ICD-10-CM

## 2016-08-24 NOTE — Progress Notes (Signed)
PRE-OPERATIVE HISTORY AND PHYSICAL EXAM  HPI:  Jordan Ortega is a 61 y.o. [phone]; she is being admitted for surgery related to adnexal mass.   She has had TAH in 1999, presents for concerns over pelvic mass found on CT/US with min sx's.  She complains of recent findings of Right adnexal mass by Ultrasound - Pelvic Vaginal, CT - Pelvis.  Pt has had symptoms of none, she was seen by PCP and GI for 20 lb weight loss over 3 mos and diarrhea.  No pelvic pain, bloating, weight gain.  No VB.  No prior h/o ovarian concerns; hyst was for bleeeding in her 12s.  Previous evaluation: CA125 neg; [place] with findings of large cyst right ovary.  [facility] ONC eval as well, low suspicion for cancer.  Prior Diagnosis: no prior hx. Previous Treatment: none.  PMHx: Past Medical History:  Diagnosis Date  . Adnexal mass   . Barrett's esophagus   . Bell palsy    left foot also ,   . Degenerative disc disease, cervical   . High triglycerides   . Migraines   . Migraines    Past Surgical History:  Procedure Laterality Date  . ABDOMINAL HYSTERECTOMY    . APPENDECTOMY    . CHOLECYSTECTOMY    . COLONOSCOPY WITH PROPOFOL N/A 07/16/2016   Procedure: COLONOSCOPY WITH PROPOFOL;  Surgeon: [name], MD;  Location: [facility] ENDOSCOPY;  Service: Endoscopy;  Laterality: N/A;  . ESOPHAGOGASTRODUODENOSCOPY (EGD) WITH PROPOFOL N/A 07/16/2016   Procedure: ESOPHAGOGASTRODUODENOSCOPY (EGD) WITH PROPOFOL;  Surgeon: [name], MD;  Location: [facility] ENDOSCOPY;  Service: Endoscopy;  Laterality: N/A;  . [place]  . TUBAL LIGATION  1983   Family History  Problem Relation Age of Onset  . Breast cancer Mother   . Skin cancer Mother   . Hypothyroidism Mother   . Migraines Sister   . Colon cancer Father   . Lung cancer Father   . Heart disease Father   . Diabetes Paternal Grandmother   . Hypertension Paternal Grandmother   . Stroke Paternal Grandmother   . Dementia Paternal Grandfather   . Breast  cancer Maternal Aunt   . Diabetes Paternal Uncle   . Breast cancer Cousin    Social History  Substance Use Topics  . Smoking status: Current Every Day Smoker    Packs/day: 0.50    Years: 30.00    Types: Cigarettes  . Smokeless tobacco: Never Used  . Alcohol use 0.0 oz/week     Comment: occasional    Current Outpatient Prescriptions:  .  albuterol (PROVENTIL HFA;VENTOLIN HFA) 108 (90 Base) MCG/ACT inhaler, Inhale 2 puffs into the lungs every 6 (six) hours as needed for wheezing or shortness of breath., Disp: 1 Inhaler, Rfl: 2 .  ALPRAZolam (XANAX) 0.5 MG tablet, Take 1 tablet (0.5 mg total) by mouth 3 (three) times daily as needed for anxiety., Disp: 90 tablet, Rfl: 0 .  aspirin-acetaminophen-caffeine (EXCEDRIN MIGRAINE) 250-250-65 MG tablet, Take 1 tablet by mouth every 6 (six) hours as needed for migraine., Disp: , Rfl:  .  baclofen (LIORESAL) 10 MG tablet, TAKE 1 TABLET (10 MG TOTAL) BY MOUTH 2 (TWO) TIMES DAILY, Disp: , Rfl: 2 .  gabapentin (NEURONTIN) 300 MG capsule, TAKE 1 CAPSULE (300 MG TOTAL) BY MOUTH NIGHTLY., Disp: , Rfl: 3 .  HYDROcodone-acetaminophen (NORCO/VICODIN) 5-325 MG tablet, Take 1 tablet by mouth every 6 (six) hours as needed. For pain (Patient taking differently: Take 1 tablet by  mouth every 6 (six) hours as needed. For migraines), Disp: 30 tablet, Rfl: 0 .  loratadine (CLARITIN) 10 MG tablet, Take 10 mg by mouth 2 (two) times daily., Disp: , Rfl:  .  metoprolol tartrate (LOPRESSOR) 25 MG tablet, Take 25 mg by mouth 2 (two) times daily. , Disp: , Rfl:  .  pantoprazole (PROTONIX) 40 MG tablet, TAKE 1 TABLET (40 MG TOTAL) BY MOUTH DAILY., Disp: 90 tablet, Rfl: 3 .  polyethylene glycol powder (GLYCOLAX/MIRALAX) powder, Take 17 g by mouth daily. (Patient taking differently: Take 17 g by mouth every other day. ), Disp: 255 g, Rfl: 3 .  Probiotic Product (PROBIOTIC-10) CHEW, Chew 2 tablets by mouth daily., Disp: , Rfl:  .  QUEtiapine (SEROQUEL) 50 MG tablet, Take 1 tablet  (50 mg total) by mouth at bedtime. (Patient taking differently: Take 75 mg by mouth at bedtime. Takes 1.5 tablets), Disp: 90 tablet, Rfl: 3 .  simvastatin (ZOCOR) 40 MG tablet, TAKE 1 TABLET BY MOUTH NIGHTLY AT BEDTIME, Disp: 30 tablet, Rfl: 5 .  Specialty Vitamins Products (BIOTIN PLUS KERATIN) 10000-100 MCG-MG TABS, Take 1 tablet by mouth daily., Disp: , Rfl:   Allergies: Ciprofloxacin; Codeine; Diazepam; Morphine; Niacin and related; Toradol [ketorolac tromethamine]; and Iodinated diagnostic agents  Review of Systems  Constitutional: Negative for chills, fever and malaise/fatigue.  HENT: Negative for congestion, sinus pain and sore throat.   Eyes: Negative for blurred vision and pain.  Respiratory: Negative for cough and wheezing.   Cardiovascular: Negative for chest pain and leg swelling.  Gastrointestinal: Negative for abdominal pain, constipation, diarrhea, heartburn, nausea and vomiting.  Genitourinary: Negative for dysuria, frequency, hematuria and urgency.  Musculoskeletal: Negative for back pain, joint pain, myalgias and neck pain.  Skin: Negative for itching and rash.  Neurological: Negative for dizziness, tremors and weakness.  Endo/Heme/Allergies: Does not bruise/bleed easily.  Psychiatric/Behavioral: Negative for depression. The patient is not nervous/anxious and does not have insomnia.     Objective: BP 90/60   Pulse (!) 55   Ht 5' (1.524 m)   Wt 95 lb (43.1 kg)   BMI 18.55 kg/m   Filed Weights   08/24/16 1450  Weight: 95 lb (43.1 kg)   Physical Exam  Constitutional: She is oriented to person, place, and time. She appears well-developed and well-nourished. No distress.  Genitourinary: Rectum normal and vagina normal. Pelvic exam was performed with patient supine. There is no rash or lesion on the right labia. There is no rash or lesion on the left labia. Vagina exhibits no lesion. No bleeding in the vagina. Right adnexum does not display mass and does not display  tenderness. Left adnexum does not display mass and does not display tenderness.  Genitourinary Comments: Absent Uterus Absent cervix Vaginal cuff well healed  HENT:  Head: Normocephalic and atraumatic. Head is without laceration.  Right Ear: Hearing normal.  Left Ear: Hearing normal.  Nose: No epistaxis.  No foreign bodies.  Mouth/Throat: Uvula is midline, oropharynx is clear and moist and mucous membranes are normal.  Eyes: Pupils are equal, round, and reactive to light.  Neck: Normal range of motion. Neck supple. No thyromegaly present.  Cardiovascular: Normal rate and regular rhythm.  Exam reveals no gallop and no friction rub.   No murmur heard. Pulmonary/Chest: Effort normal and breath sounds normal. No respiratory distress. She has no wheezes. Right breast exhibits no mass, no skin change and no tenderness. Left breast exhibits no mass, no skin change and no tenderness.  Abdominal: Soft.  Bowel sounds are normal. She exhibits no distension. There is no tenderness. There is no rebound.  Musculoskeletal: Normal range of motion.  Neurological: She is alert and oriented to person, place, and time. No cranial nerve deficit.  Skin: Skin is warm and dry.  Psychiatric: She has a normal mood and affect. Judgment normal.  Vitals reviewed.   Assessment: 1. Pelvic mass   Excision both ovaries planned, with mass intact so may require min-laparotomy or even full laparotomy.  I have had a careful discussion with this patient about all the options available and the risk/benefits of each. I have fully informed this patient that surgery may subject her to a variety of discomforts and risks: She understands that most patients have surgery with little difficulty, but problems can happen ranging from minor to fatal. These include nausea, vomiting, pain, bleeding, infection, poor healing, hernia, or formation of adhesions. Unexpected reactions may occur from any drug or anesthetic given. Unintended injury  may occur to other pelvic or abdominal structures such as Fallopian tubes, ovaries, bladder, ureter (tube from kidney to bladder), or bowel. Nerves going from the pelvis to the legs may be injured. Any such injury may require immediate or later additional surgery to correct the problem. Excessive blood loss requiring transfusion is very unlikely but possible. Dangerous blood clots may form in the legs or lungs. Physical and sexual activity will be restricted in varying degrees for an indeterminate period of time but most often 2-6 weeks.  Finally, she understands that it is impossible to list every possible undesirable effect and that the condition for which surgery is done is not always cured or significantly improved, and in rare cases may be even worse.Ample time was given to answer all questions.  Barnett Applebaum, MD, [facility], [facility] 08/24/2016  3:26 PM

## 2016-08-25 ENCOUNTER — Encounter
Admission: RE | Admit: 2016-08-25 | Discharge: 2016-08-25 | Disposition: A | Discharge status: Home / Self Care | Payer: BC Managed Care – PPO | Source: Ambulatory Visit | Attending: Obstetrics & Gynecology | Admitting: Obstetrics & Gynecology

## 2016-08-25 DIAGNOSIS — M1990 Unspecified osteoarthritis, unspecified site: Secondary | ICD-10-CM | POA: Diagnosis not present

## 2016-08-25 DIAGNOSIS — Z800 Family history of malignant neoplasm of digestive organs: Secondary | ICD-10-CM | POA: Diagnosis not present

## 2016-08-25 DIAGNOSIS — Z91041 Radiographic dye allergy status: Secondary | ICD-10-CM | POA: Diagnosis not present

## 2016-08-25 DIAGNOSIS — Z801 Family history of malignant neoplasm of trachea, bronchus and lung: Secondary | ICD-10-CM | POA: Diagnosis not present

## 2016-08-25 DIAGNOSIS — Z888 Allergy status to other drugs, medicaments and biological substances status: Secondary | ICD-10-CM | POA: Diagnosis not present

## 2016-08-25 DIAGNOSIS — K2270 Barrett's esophagus without dysplasia: Secondary | ICD-10-CM | POA: Diagnosis not present

## 2016-08-25 DIAGNOSIS — Z885 Allergy status to narcotic agent status: Secondary | ICD-10-CM | POA: Diagnosis not present

## 2016-08-25 DIAGNOSIS — Z79899 Other long term (current) drug therapy: Secondary | ICD-10-CM | POA: Diagnosis not present

## 2016-08-25 DIAGNOSIS — F418 Other specified anxiety disorders: Secondary | ICD-10-CM | POA: Diagnosis not present

## 2016-08-25 DIAGNOSIS — Z7951 Long term (current) use of inhaled steroids: Secondary | ICD-10-CM | POA: Diagnosis not present

## 2016-08-25 DIAGNOSIS — Z803 Family history of malignant neoplasm of breast: Secondary | ICD-10-CM | POA: Diagnosis not present

## 2016-08-25 DIAGNOSIS — Z808 Family history of malignant neoplasm of other organs or systems: Secondary | ICD-10-CM | POA: Diagnosis not present

## 2016-08-25 DIAGNOSIS — J45909 Unspecified asthma, uncomplicated: Secondary | ICD-10-CM | POA: Diagnosis not present

## 2016-08-25 DIAGNOSIS — K219 Gastro-esophageal reflux disease without esophagitis: Secondary | ICD-10-CM | POA: Diagnosis not present

## 2016-08-25 DIAGNOSIS — D270 Benign neoplasm of right ovary: Secondary | ICD-10-CM | POA: Diagnosis not present

## 2016-08-25 DIAGNOSIS — Z90710 Acquired absence of both cervix and uterus: Secondary | ICD-10-CM | POA: Diagnosis not present

## 2016-08-25 DIAGNOSIS — N736 Female pelvic peritoneal adhesions (postinfective): Secondary | ICD-10-CM | POA: Diagnosis not present

## 2016-08-25 DIAGNOSIS — Z833 Family history of diabetes mellitus: Secondary | ICD-10-CM | POA: Diagnosis not present

## 2016-08-25 DIAGNOSIS — N83201 Unspecified ovarian cyst, right side: Secondary | ICD-10-CM | POA: Diagnosis present

## 2016-08-25 DIAGNOSIS — F319 Bipolar disorder, unspecified: Secondary | ICD-10-CM | POA: Diagnosis not present

## 2016-08-25 DIAGNOSIS — M5030 Other cervical disc degeneration, unspecified cervical region: Secondary | ICD-10-CM | POA: Diagnosis not present

## 2016-08-25 DIAGNOSIS — F17210 Nicotine dependence, cigarettes, uncomplicated: Secondary | ICD-10-CM | POA: Diagnosis not present

## 2016-08-25 DIAGNOSIS — G43909 Migraine, unspecified, not intractable, without status migrainosus: Secondary | ICD-10-CM | POA: Diagnosis not present

## 2016-08-25 DIAGNOSIS — G709 Myoneural disorder, unspecified: Secondary | ICD-10-CM | POA: Diagnosis not present

## 2016-08-25 DIAGNOSIS — Z881 Allergy status to other antibiotic agents status: Secondary | ICD-10-CM | POA: Diagnosis not present

## 2016-08-25 HISTORY — DX: Other allergic rhinitis: J30.89

## 2016-08-25 HISTORY — DX: Other complications of anesthesia, initial encounter: T88.59XA

## 2016-08-25 HISTORY — DX: Tachycardia, unspecified: R00.0

## 2016-08-25 HISTORY — DX: Major depressive disorder, single episode, unspecified: F32.9

## 2016-08-25 HISTORY — DX: Anxiety disorder, unspecified: F41.9

## 2016-08-25 HISTORY — DX: Bronchitis, not specified as acute or chronic: J40

## 2016-08-25 HISTORY — DX: Gastro-esophageal reflux disease without esophagitis: K21.9

## 2016-08-25 HISTORY — DX: Depression, unspecified: F32.A

## 2016-08-25 HISTORY — DX: Adverse effect of unspecified anesthetic, initial encounter: T41.45XA

## 2016-08-25 LAB — CBC
HEMATOCRIT: 37.4 % (ref 35.0–47.0)
Hemoglobin: 12.9 g/dL (ref 12.0–16.0)
MCH: 32.5 pg (ref 26.0–34.0)
MCHC: 34.5 g/dL (ref 32.0–36.0)
MCV: 94.2 fL (ref 80.0–100.0)
Platelets: 204 10*3/uL (ref 150–440)
RBC: 3.97 MIL/uL (ref 3.80–5.20)
RDW: 13.2 % (ref 11.5–14.5)
WBC: 8.1 10*3/uL (ref 3.6–11.0)

## 2016-08-25 LAB — TYPE AND SCREEN
ABO/RH(D): A POS
Antibody Screen: NEGATIVE

## 2016-08-25 LAB — PROTIME-INR
INR: 0.98
Prothrombin Time: 13 seconds (ref 11.4–15.2)

## 2016-08-25 LAB — APTT: APTT: 29 s (ref 24–36)

## 2016-08-25 NOTE — Patient Instructions (Signed)
Your procedure is scheduled on: 08/27/16 Fri Report to Same Day Surgery 2nd floor medical mall [facility] Entrance-take elevator on left to 2nd floor.  Check in with surgery information desk.) To find out your arrival time please call [phone] between 1PM - 3PM on 08/26/16 Thurs  Remember: Instructions that are not followed completely may result in serious medical risk, up to and including death, or upon the discretion of your surgeon and anesthesiologist your surgery may need to be rescheduled.    _x___ 1. Do not eat food or drink liquids after midnight. No gum chewing or                              hard candies.     __x__ 2. No Alcohol for 24 hours before or after surgery.   __x__3. No Smoking for 24 prior to surgery.   ____  4. Bring all medications with you on the day of surgery if instructed.    __x__ 5. Notify your doctor if there is any change in your medical condition     (cold, fever, infections).     Do not wear jewelry, make-up, hairpins, clips or nail polish.  Do not wear lotions, powders, or perfumes. You may wear deodorant.  Do not shave 48 hours prior to surgery. Men may shave face and neck.  Do not bring valuables to the hospital.    [facility] is not responsible for any belongings or valuables.               Contacts, dentures or bridgework may not be worn into surgery.  Leave your suitcase in the car. After surgery it may be brought to your room.  For patients admitted to the hospital, discharge time is determined by your                       treatment team.   Patients discharged the day of surgery will not be allowed to drive home.  You will need someone to drive you home and stay with you the night of your procedure.    Please read over the following fact sheets that you were given:   [facility] Preparing for Surgery and or MRSA Information   _x___ Take anti-hypertensive (unless it includes a diuretic), cardiac, seizure, asthma,     anti-reflux and  psychiatric medicines. These include:  1. albuterol (PROVENTIL   2.ALPRAZolam (XANAX) 0.5  3.baclofen (LIORESAL  4.metoprolol tartrate (LOPRESSOR)  5.pantoprazole (PROTONIX) 40 MG   6.  ____Fleets enema or Magnesium Citrate as directed.   _x___ Use CHG Soap or sage wipes as directed on instruction sheet   ____ Use inhalers on the day of surgery and bring to hospital day of surgery  ____ Stop Metformin and Janumet 2 days prior to surgery.    ____ Take 1/2 of usual insulin dose the night before surgery and none on the morning     surgery.   _x___ Follow recommendations from Cardiologist, Pulmonologist or PCP regarding          stopping Aspirin, Coumadin, Pllavix ,Eliquis, Effient, or Pradaxa, and Pletal.  X____Stop Anti-inflammatories such as Advil, Aleve, Ibuprofen, Motrin, Naproxen, Naprosyn, Goodies powders or aspirin products. OK to take Tylenol and                          Celebrex.   _x___ Stop supplements  until after surgery.  But may continue Vitamin D, Vitamin B,       and multivitamin.   ____ Bring C-Pap to the hospital.

## 2016-08-26 MED ORDER — CEFOXITIN SODIUM-DEXTROSE 2-2.2 GM-% IV SOLR (PREMIX): 2.0000 g | INTRAVENOUS | Status: DC

## 2016-08-27 ENCOUNTER — Ambulatory Visit: Payer: BC Managed Care – PPO | Admitting: Anesthesiology

## 2016-08-27 ENCOUNTER — Ambulatory Visit
Admission: RE | Admit: 2016-08-27 | Discharge: 2016-08-27 | Disposition: A | Discharge status: Home / Self Care | Payer: BC Managed Care – PPO | Source: Ambulatory Visit | Attending: Obstetrics & Gynecology | Admitting: Obstetrics & Gynecology

## 2016-08-27 ENCOUNTER — Encounter: Payer: Self-pay | Admitting: *Deleted

## 2016-08-27 ENCOUNTER — Encounter
Admission: RE | Disposition: A | Discharge status: Home / Self Care | Payer: Self-pay | Source: Ambulatory Visit | Attending: Obstetrics & Gynecology

## 2016-08-27 DIAGNOSIS — N736 Female pelvic peritoneal adhesions (postinfective): Secondary | ICD-10-CM | POA: Insufficient documentation

## 2016-08-27 DIAGNOSIS — Z7951 Long term (current) use of inhaled steroids: Secondary | ICD-10-CM | POA: Insufficient documentation

## 2016-08-27 DIAGNOSIS — Z79899 Other long term (current) drug therapy: Secondary | ICD-10-CM | POA: Insufficient documentation

## 2016-08-27 DIAGNOSIS — D270 Benign neoplasm of right ovary: Secondary | ICD-10-CM | POA: Diagnosis not present

## 2016-08-27 DIAGNOSIS — K2270 Barrett's esophagus without dysplasia: Secondary | ICD-10-CM | POA: Insufficient documentation

## 2016-08-27 DIAGNOSIS — Z90710 Acquired absence of both cervix and uterus: Secondary | ICD-10-CM | POA: Insufficient documentation

## 2016-08-27 DIAGNOSIS — K219 Gastro-esophageal reflux disease without esophagitis: Secondary | ICD-10-CM | POA: Insufficient documentation

## 2016-08-27 DIAGNOSIS — Z808 Family history of malignant neoplasm of other organs or systems: Secondary | ICD-10-CM | POA: Insufficient documentation

## 2016-08-27 DIAGNOSIS — N83201 Unspecified ovarian cyst, right side: Secondary | ICD-10-CM | POA: Diagnosis present

## 2016-08-27 DIAGNOSIS — F418 Other specified anxiety disorders: Secondary | ICD-10-CM | POA: Insufficient documentation

## 2016-08-27 DIAGNOSIS — M5030 Other cervical disc degeneration, unspecified cervical region: Secondary | ICD-10-CM | POA: Insufficient documentation

## 2016-08-27 DIAGNOSIS — R1013 Epigastric pain: Secondary | ICD-10-CM | POA: Diagnosis present

## 2016-08-27 DIAGNOSIS — G43909 Migraine, unspecified, not intractable, without status migrainosus: Secondary | ICD-10-CM | POA: Insufficient documentation

## 2016-08-27 DIAGNOSIS — Z885 Allergy status to narcotic agent status: Secondary | ICD-10-CM | POA: Insufficient documentation

## 2016-08-27 DIAGNOSIS — Z91041 Radiographic dye allergy status: Secondary | ICD-10-CM | POA: Insufficient documentation

## 2016-08-27 DIAGNOSIS — M1990 Unspecified osteoarthritis, unspecified site: Secondary | ICD-10-CM | POA: Insufficient documentation

## 2016-08-27 DIAGNOSIS — Z818 Family history of other mental and behavioral disorders: Secondary | ICD-10-CM | POA: Insufficient documentation

## 2016-08-27 DIAGNOSIS — Z820 Family history of epilepsy and other diseases of the nervous system: Secondary | ICD-10-CM | POA: Insufficient documentation

## 2016-08-27 DIAGNOSIS — Z803 Family history of malignant neoplasm of breast: Secondary | ICD-10-CM | POA: Insufficient documentation

## 2016-08-27 DIAGNOSIS — Z800 Family history of malignant neoplasm of digestive organs: Secondary | ICD-10-CM | POA: Insufficient documentation

## 2016-08-27 DIAGNOSIS — G709 Myoneural disorder, unspecified: Secondary | ICD-10-CM | POA: Insufficient documentation

## 2016-08-27 DIAGNOSIS — Z888 Allergy status to other drugs, medicaments and biological substances status: Secondary | ICD-10-CM | POA: Insufficient documentation

## 2016-08-27 DIAGNOSIS — F17210 Nicotine dependence, cigarettes, uncomplicated: Secondary | ICD-10-CM | POA: Insufficient documentation

## 2016-08-27 DIAGNOSIS — F319 Bipolar disorder, unspecified: Secondary | ICD-10-CM | POA: Insufficient documentation

## 2016-08-27 DIAGNOSIS — Z801 Family history of malignant neoplasm of trachea, bronchus and lung: Secondary | ICD-10-CM | POA: Insufficient documentation

## 2016-08-27 DIAGNOSIS — Z8249 Family history of ischemic heart disease and other diseases of the circulatory system: Secondary | ICD-10-CM | POA: Insufficient documentation

## 2016-08-27 DIAGNOSIS — Z881 Allergy status to other antibiotic agents status: Secondary | ICD-10-CM | POA: Insufficient documentation

## 2016-08-27 DIAGNOSIS — Z833 Family history of diabetes mellitus: Secondary | ICD-10-CM | POA: Insufficient documentation

## 2016-08-27 DIAGNOSIS — J45909 Unspecified asthma, uncomplicated: Secondary | ICD-10-CM | POA: Insufficient documentation

## 2016-08-27 DIAGNOSIS — Z8349 Family history of other endocrine, nutritional and metabolic diseases: Secondary | ICD-10-CM | POA: Insufficient documentation

## 2016-08-27 HISTORY — PX: LAPAROSCOPIC SALPINGO OOPHERECTOMY: SHX5927

## 2016-08-27 LAB — ABO/RH: ABO/RH(D): A POS

## 2016-08-27 SURGERY — SALPINGO-OOPHORECTOMY, LAPAROSCOPIC
Anesthesia: General | Laterality: Bilateral | Wound class: Clean

## 2016-08-27 MED ORDER — ONDANSETRON HCL 4 MG/2ML IJ SOLN
INTRAMUSCULAR | Status: AC
  Filled 2016-08-27: qty 2

## 2016-08-27 MED ORDER — SUGAMMADEX SODIUM 200 MG/2ML IV SOLN
INTRAVENOUS | Status: DC | PRN
  Administered 2016-08-27: 90 mg via INTRAVENOUS

## 2016-08-27 MED ORDER — FENTANYL CITRATE (PF) 100 MCG/2ML IJ SOLN
INTRAMUSCULAR | Status: AC
  Filled 2016-08-27: qty 2

## 2016-08-27 MED ORDER — DEXAMETHASONE SODIUM PHOSPHATE 10 MG/ML IJ SOLN
INTRAMUSCULAR | Status: AC
  Filled 2016-08-27: qty 1

## 2016-08-27 MED ORDER — ACETAMINOPHEN 650 MG RE SUPP
650.0000 mg | RECTAL | Status: DC | PRN
  Filled 2016-08-27: qty 1

## 2016-08-27 MED ORDER — HYDROCODONE-ACETAMINOPHEN 5-325 MG PO TABS: 1.0000 | ORAL_TABLET | Freq: Four times a day (QID) | ORAL | 0 refills | Status: DC | PRN

## 2016-08-27 MED ORDER — PROPOFOL 10 MG/ML IV BOLUS
INTRAVENOUS | Status: DC | PRN
  Administered 2016-08-27: 100 mg via INTRAVENOUS

## 2016-08-27 MED ORDER — LIDOCAINE HCL (CARDIAC) 20 MG/ML IV SOLN
INTRAVENOUS | Status: DC | PRN
  Administered 2016-08-27: 40 mg via INTRAVENOUS

## 2016-08-27 MED ORDER — ROCURONIUM BROMIDE 100 MG/10ML IV SOLN
INTRAVENOUS | Status: DC | PRN
  Administered 2016-08-27: 30 mg via INTRAVENOUS

## 2016-08-27 MED ORDER — PROMETHAZINE HCL 25 MG/ML IJ SOLN
6.2500 mg | Freq: Once | INTRAMUSCULAR | Status: AC
  Administered 2016-08-27: 6.25 mg via INTRAVENOUS

## 2016-08-27 MED ORDER — ROCURONIUM BROMIDE 50 MG/5ML IV SOLN
INTRAVENOUS | Status: AC
  Filled 2016-08-27: qty 1

## 2016-08-27 MED ORDER — LACTATED RINGERS IV SOLN
INTRAVENOUS | Status: DC
  Administered 2016-08-27 (×2): via INTRAVENOUS

## 2016-08-27 MED ORDER — CEFOXITIN SODIUM-DEXTROSE 2-2.2 GM-% IV SOLR (PREMIX)
INTRAVENOUS | Status: AC
  Filled 2016-08-27: qty 50

## 2016-08-27 MED ORDER — HYDROMORPHONE HCL 1 MG/ML IJ SOLN
0.2500 mg | Freq: Once | INTRAMUSCULAR | Status: AC
  Administered 2016-08-27: 0.25 mg via INTRAVENOUS

## 2016-08-27 MED ORDER — PROMETHAZINE HCL 25 MG/ML IJ SOLN
INTRAMUSCULAR | Status: AC
  Administered 2016-08-27: 6.25 mg via INTRAVENOUS
  Filled 2016-08-27: qty 1

## 2016-08-27 MED ORDER — NEOSTIGMINE METHYLSULFATE 10 MG/10ML IV SOLN
INTRAVENOUS | Status: AC
  Filled 2016-08-27: qty 1

## 2016-08-27 MED ORDER — ACETAMINOPHEN 10 MG/ML IV SOLN
INTRAVENOUS | Status: DC | PRN
  Administered 2016-08-27: 1000 mg via INTRAVENOUS

## 2016-08-27 MED ORDER — MEPERIDINE HCL 50 MG/ML IJ SOLN
INTRAMUSCULAR | Status: AC
  Administered 2016-08-27: 25 mg
  Filled 2016-08-27: qty 1

## 2016-08-27 MED ORDER — LIDOCAINE HCL (PF) 2 % IJ SOLN
INTRAMUSCULAR | Status: AC
  Filled 2016-08-27: qty 2

## 2016-08-27 MED ORDER — ONDANSETRON HCL 4 MG/2ML IJ SOLN
4.0000 mg | Freq: Once | INTRAMUSCULAR | Status: AC
  Administered 2016-08-27: 4 mg via INTRAVENOUS

## 2016-08-27 MED ORDER — LACTATED RINGERS IV SOLN
INTRAVENOUS | Status: DC
  Administered 2016-08-27: 10:00:00 via INTRAVENOUS

## 2016-08-27 MED ORDER — HYDROCODONE-ACETAMINOPHEN 5-325 MG PO TABS
1.0000 | ORAL_TABLET | Freq: Four times a day (QID) | ORAL | Status: DC | PRN
  Administered 2016-08-27: 1 via ORAL

## 2016-08-27 MED ORDER — SUGAMMADEX SODIUM 200 MG/2ML IV SOLN
INTRAVENOUS | Status: AC
  Filled 2016-08-27: qty 2

## 2016-08-27 MED ORDER — SODIUM CHLORIDE FLUSH 0.9 % IV SOLN
INTRAVENOUS | Status: AC
  Filled 2016-08-27: qty 3

## 2016-08-27 MED ORDER — MEPERIDINE HCL 25 MG/ML IJ SOLN: 25.0000 mg | Freq: Once | INTRAMUSCULAR | Status: DC

## 2016-08-27 MED ORDER — HYDROCODONE-ACETAMINOPHEN 5-325 MG PO TABS
ORAL_TABLET | ORAL | Status: AC
  Filled 2016-08-27: qty 1

## 2016-08-27 MED ORDER — MORPHINE SULFATE (PF) 4 MG/ML IV SOLN
1.0000 mg | INTRAVENOUS | Status: DC | PRN
Start: 2016-08-27 — End: 2016-08-27

## 2016-08-27 MED ORDER — ACETAMINOPHEN 10 MG/ML IV SOLN
INTRAVENOUS | Status: AC
  Filled 2016-08-27: qty 100

## 2016-08-27 MED ORDER — EPHEDRINE SULFATE 50 MG/ML IJ SOLN
15.0000 mg | Freq: Once | INTRAMUSCULAR | Status: AC
  Administered 2016-08-27: 15 mg via INTRAVENOUS

## 2016-08-27 MED ORDER — FENTANYL CITRATE (PF) 100 MCG/2ML IJ SOLN
INTRAMUSCULAR | Status: AC
  Administered 2016-08-27: 25 ug via INTRAVENOUS
  Filled 2016-08-27: qty 2

## 2016-08-27 MED ORDER — ONDANSETRON HCL 4 MG/2ML IJ SOLN
INTRAMUSCULAR | Status: AC
  Administered 2016-08-27: 4 mg via INTRAVENOUS
  Filled 2016-08-27: qty 2

## 2016-08-27 MED ORDER — HYDROMORPHONE HCL 1 MG/ML IJ SOLN
INTRAMUSCULAR | Status: AC
  Administered 2016-08-27: 0.25 mg via INTRAVENOUS
  Filled 2016-08-27: qty 1

## 2016-08-27 MED ORDER — MIDAZOLAM HCL 2 MG/2ML IJ SOLN
INTRAMUSCULAR | Status: DC | PRN
  Administered 2016-08-27 (×2): 1 mg via INTRAVENOUS

## 2016-08-27 MED ORDER — BUPIVACAINE HCL (PF) 0.5 % IJ SOLN
INTRAMUSCULAR | Status: DC | PRN
  Administered 2016-08-27: 17 mL

## 2016-08-27 MED ORDER — PROPOFOL 10 MG/ML IV BOLUS
INTRAVENOUS | Status: AC
  Filled 2016-08-27: qty 20

## 2016-08-27 MED ORDER — DEXAMETHASONE SODIUM PHOSPHATE 10 MG/ML IJ SOLN
INTRAMUSCULAR | Status: DC | PRN
  Administered 2016-08-27: 10 mg via INTRAVENOUS

## 2016-08-27 MED ORDER — CEFOXITIN SODIUM-DEXTROSE 2-2.2 GM-% IV SOLR (PREMIX)
2.0000 g | Freq: Once | INTRAVENOUS | Status: AC
  Administered 2016-08-27: 2 g via INTRAVENOUS

## 2016-08-27 MED ORDER — MIDAZOLAM HCL 2 MG/2ML IJ SOLN
INTRAMUSCULAR | Status: AC
  Filled 2016-08-27: qty 2

## 2016-08-27 MED ORDER — EPHEDRINE SULFATE 50 MG/ML IJ SOLN
INTRAMUSCULAR | Status: DC | PRN
  Administered 2016-08-27 (×2): 10 mg via INTRAVENOUS

## 2016-08-27 MED ORDER — ACETAMINOPHEN 325 MG PO TABS: 650.0000 mg | ORAL_TABLET | ORAL | Status: DC | PRN

## 2016-08-27 MED ORDER — BUPIVACAINE HCL (PF) 0.5 % IJ SOLN
INTRAMUSCULAR | Status: AC
  Filled 2016-08-27: qty 30

## 2016-08-27 MED ORDER — ONDANSETRON HCL 4 MG/2ML IJ SOLN
INTRAMUSCULAR | Status: AC
  Filled 2016-08-27: qty 4

## 2016-08-27 MED ORDER — SODIUM CHLORIDE 0.9 % IJ SOLN
INTRAMUSCULAR | Status: AC
  Filled 2016-08-27: qty 10

## 2016-08-27 MED ORDER — ONDANSETRON HCL 4 MG/2ML IJ SOLN
4.0000 mg | Freq: Once | INTRAMUSCULAR | Status: AC | PRN
  Administered 2016-08-27: 4 mg via INTRAVENOUS

## 2016-08-27 MED ORDER — KETOROLAC TROMETHAMINE 30 MG/ML IJ SOLN
INTRAMUSCULAR | Status: AC
  Filled 2016-08-27: qty 1

## 2016-08-27 MED ORDER — EPHEDRINE SULFATE 50 MG/ML IJ SOLN
INTRAMUSCULAR | Status: AC
  Administered 2016-08-27: 15 mg via INTRAVENOUS
  Filled 2016-08-27: qty 1

## 2016-08-27 MED ORDER — FENTANYL CITRATE (PF) 100 MCG/2ML IJ SOLN
INTRAMUSCULAR | Status: DC | PRN
  Administered 2016-08-27 (×2): 25 ug via INTRAVENOUS
  Administered 2016-08-27: 50 ug via INTRAVENOUS
  Administered 2016-08-27 (×2): 25 ug via INTRAVENOUS

## 2016-08-27 MED ORDER — FENTANYL CITRATE (PF) 100 MCG/2ML IJ SOLN
25.0000 ug | INTRAMUSCULAR | Status: DC | PRN
  Administered 2016-08-27 (×4): 25 ug via INTRAVENOUS

## 2016-08-27 SURGICAL SUPPLY — 55 items
ADH SKN CLS APL DERMABOND .7 (GAUZE/BANDAGES/DRESSINGS) ×1
ANCHOR TIS RET SYS 1550ML (BAG) ×1 IMPLANT
ANCHOR TIS RET SYS 235ML (MISCELLANEOUS) ×1 IMPLANT
BAG SPEC RTRVL C1550 25.4 (BAG) ×1
BAG SPEC RTRVL LRG 6X4 10 (ENDOMECHANICALS)
BAG TISS RTRVL C235 10X14 (MISCELLANEOUS) ×1
BLADE SURG SZ11 CARB STEEL (BLADE) ×3 IMPLANT
CANISTER SUCT 1200ML W/VALVE (MISCELLANEOUS) ×2 IMPLANT
CATH ROBINSON RED A/P 16FR (CATHETERS) ×2 IMPLANT
CHLORAPREP W/TINT 26ML (MISCELLANEOUS) ×2 IMPLANT
DERMABOND ADVANCED (GAUZE/BANDAGES/DRESSINGS) ×1
DERMABOND ADVANCED .7 DNX12 (GAUZE/BANDAGES/DRESSINGS) ×1 IMPLANT
DEVICE DUBIN SPECIMEN MAMMOGRA (MISCELLANEOUS) ×1 IMPLANT
DRAPE LAPAROTOMY 100X77 ABD (DRAPES) ×1 IMPLANT
DRAPE LAPAROTOMY TRNSV 106X77 (MISCELLANEOUS) ×1 IMPLANT
DRSG TEGADERM 2-3/8X2-3/4 SM (GAUZE/BANDAGES/DRESSINGS) ×8 IMPLANT
DRSG TELFA 4X3 1S NADH ST (GAUZE/BANDAGES/DRESSINGS) ×1 IMPLANT
GAUZE SPONGE NON-WVN 2X2 STRL (MISCELLANEOUS) IMPLANT
GLOVE BIO SURGEON STRL SZ8 (GLOVE) ×3 IMPLANT
GLOVE BIOGEL PI IND STRL 7.0 (GLOVE) IMPLANT
GLOVE BIOGEL PI INDICATOR 7.0 (GLOVE) ×1
GLOVE INDICATOR 8.0 STRL GRN (GLOVE) ×2 IMPLANT
GLOVE SURG SYN 6.5 ES PF (GLOVE) ×4 IMPLANT
GLOVE SURG SYN 6.5 PF PI (GLOVE) IMPLANT
GOWN STRL REUS W/ TWL LRG LVL3 (GOWN DISPOSABLE) ×1 IMPLANT
GOWN STRL REUS W/ TWL XL LVL3 (GOWN DISPOSABLE) ×1 IMPLANT
GOWN STRL REUS W/TWL LRG LVL3 (GOWN DISPOSABLE) ×2
GOWN STRL REUS W/TWL XL LVL3 (GOWN DISPOSABLE) ×8
GRASPER SUT TROCAR 14GX15 (MISCELLANEOUS) ×2 IMPLANT
IRRIGATION STRYKERFLOW (MISCELLANEOUS) ×1 IMPLANT
IRRIGATOR STRYKERFLOW (MISCELLANEOUS) ×2
IV LACTATED RINGERS 1000ML (IV SOLUTION) ×1 IMPLANT
KIT PINK PAD W/HEAD ARE REST (MISCELLANEOUS) ×2
KIT PINK PAD W/HEAD ARM REST (MISCELLANEOUS) ×1 IMPLANT
LABEL OR SOLS (LABEL) ×2 IMPLANT
NEEDLE VERESS 14GA 120MM (NEEDLE) ×2 IMPLANT
NS IRRIG 500ML POUR BTL (IV SOLUTION) ×2 IMPLANT
PACK GYN LAPAROSCOPIC (MISCELLANEOUS) ×2 IMPLANT
PAD PREP 24X41 OB/GYN DISP (PERSONAL CARE ITEMS) ×2 IMPLANT
POUCH SPECIMEN RETRIEVAL 10MM (ENDOMECHANICALS) IMPLANT
SCISSORS METZENBAUM CVD 33 (INSTRUMENTS) ×2 IMPLANT
SHEARS HARMONIC ACE PLUS 36CM (ENDOMECHANICALS) ×1 IMPLANT
SLEEVE ENDOPATH XCEL 5M (ENDOMECHANICALS) IMPLANT
SPONGE VERSALON 2X2 STRL (MISCELLANEOUS)
STRAP SAFETY BODY (MISCELLANEOUS) ×2 IMPLANT
SUT VIC AB 0 CT1 36 (SUTURE) ×2 IMPLANT
SUT VIC AB 2-0 UR6 27 (SUTURE) IMPLANT
SUT VIC AB 4-0 PS2 18 (SUTURE) IMPLANT
SYR 10ML LL (SYRINGE) ×1 IMPLANT
SYRINGE 10CC LL (SYRINGE) ×2 IMPLANT
TRAY FOLEY CATH SILVER 16FR LF (SET/KITS/TRAYS/PACK) ×1 IMPLANT
TROCAR BLADELESS 15MM (ENDOMECHANICALS) ×1 IMPLANT
TROCAR ENDO BLADELESS 11MM (ENDOMECHANICALS) IMPLANT
TROCAR XCEL NON-BLD 5MMX100MML (ENDOMECHANICALS) ×2 IMPLANT
TUBING INSUFFLATOR HI FLOW (MISCELLANEOUS) ×2 IMPLANT

## 2016-08-27 NOTE — Transfer of Care (Signed)
Immediate Anesthesia Transfer of Care Note  [name]  Procedure(s) Performed: Procedure(s): LAPAROSCOPIC SALPINGO OOPHORECTOMY (Bilateral)  Patient Location: PACU  Anesthesia Type:General  Level of Consciousness: drowsy and patient cooperative  Airway & Oxygen Therapy: Patient Spontanous Breathing and Patient connected to nasal cannula oxygen  Post-op Assessment: Report given to RN and Post -op Vital signs reviewed and stable  Post vital signs: Reviewed and stable  Last Vitals:  Vitals:   08/27/16 0940 08/27/16 1238  BP: 110/63 (!) 99/52  Pulse: (!) 49 65  Resp: 16 13  Temp: 36.7 C 36.4 C    Last Pain:  Vitals:   08/27/16 1238  TempSrc: Temporal  PainSc: 0-No pain      Patients Stated Pain Goal: 0 (24/81/85 9093)  Complications: No apparent anesthesia complications

## 2016-08-27 NOTE — Anesthesia Procedure Notes (Addendum)
Procedure Name: Intubation Date/Time: 08/27/2016 11:12 AM Performed by: Darlyne Russian Pre-anesthesia Checklist: Patient identified, Patient being monitored, Timeout performed, Emergency Drugs available and Suction available Patient Re-evaluated:Patient Re-evaluated prior to induction Oxygen Delivery Method: Circle system utilized Preoxygenation: Pre-oxygenation with 100% oxygen Induction Type: IV induction Ventilation: Mask ventilation without difficulty Laryngoscope Size: Mac and 3 Grade View: Grade I Tube type: Oral Tube size: 7.0 mm Number of attempts: 1 Airway Equipment and Method: Stylet Placement Confirmation: ETT inserted through vocal cords under direct vision,  positive ETCO2 and breath sounds checked- equal and bilateral Secured at: 21 cm Tube secured with: Tape Dental Injury: Teeth and Oropharynx as per pre-operative assessment

## 2016-08-27 NOTE — OR Nursing (Signed)
Okay to use betadine prep per patient.

## 2016-08-27 NOTE — Op Note (Signed)
Operative Note:  PRE-OP DIAGNOSIS: RIGHT OVARIAN CYST   POST-OP DIAGNOSIS: RIGHT OVARIAN CYST   PROCEDURE: Procedure(s): LAPAROSCOPIC BILATERAL SALPINGO OOPHORECTOMY  SURGEON: Barnett Applebaum, MD, FACOG  ANESTHESIA: General endotracheal anesthesia  ESTIMATED BLOOD LOSS: 15 mL  SPECIMENS: Tubes and Ovaries.  COMPLICATIONS: None  DISPOSITION: stable to PACU  FINDINGS: Intraabdominal adhesions were noted. Large right ovary, atrophic left ovary. Frozen Section Analysis: Benign Mucinous cystadenoma   PROCEDURE IN DETAIL: The patient was taken to the OR where anesthesia was administed. The patient was positioned supine. Foley placed.  Attention was turned to the patient's abdomen where a 5 mm skin incision was made in the umbilical fold, after injection of local anesthesia. The Veress step needle was carefully introduced into the peritoneal cavity with placement confirmed using the hanging drop technique. Pneumoperitoneum was obtained. The 5 mm port was then placed under direct visualization with the operative laparoscope The above noted findings. Trendelenburg.  5 mm  And 11 mm trocars were then placed in the RLQ  lateral to the inferior epigastric blood vessels and suprapubic region, respectfully, under direct visualization with the laparoscope. Right and left fallopian tubes/ovaries are identified and the ovarian blood vessels/infundibulopelvic ligament pedicles are coagulated and ligated using the Harmonic scapel. The right ovarian cyst ruptured and leaked fluid in pelvis, collected as washings.They are removed through a specimen bag. No injuries or bleeding was noted.  All instruments and ports were then removed from the abdomen after gas was expelled and patient was leveled.Fascia closed with 2-0 vicryl at 11 mm site and skin with 4-0 vicryl suture. The skin was closed with skin adhesive. The foley catheter was removed. The patient tolerated the procedure well. All counts were  correct x 2. The patient was transferred to the recovery room awake, alert and breathing independently.  Barnett Applebaum, MD, [facility], [facility] 08/27/2016  12:28 PM

## 2016-08-27 NOTE — Anesthesia Post-op Follow-up Note (Cosign Needed)
Anesthesia QCDR form completed.        

## 2016-08-27 NOTE — H&P (Signed)
History and Physical Interval Note:  08/27/2016 9:18 AM  Jordan Ortega  has presented today for surgery, with the diagnosis of RIGHT OVARIAN CYST  The various methods of treatment have been discussed with the patient and family. After consideration of risks, benefits and other options for treatment, the patient has consented to  Procedure(s): LAPAROSCOPIC SALPINGO OOPHORECTOMY (Bilateral) as a surgical intervention (and possible laparotomy for removal of mass intact if possible).  The patient's history has been reviewed, patient examined, no change in status, stable for surgery.  Pt has the following beta blocker history-  Pre-op Days 1-30.  I have reviewed the patient's chart and labs.  Questions were answered to the patient's satisfaction.    Hoyt Koch

## 2016-08-27 NOTE — Anesthesia Preprocedure Evaluation (Addendum)
Anesthesia Evaluation  Patient identified by MRN, date of birth, ID band Patient awake    Reviewed: Allergy & Precautions, NPO status , Patient's Chart, lab work & pertinent test results, reviewed documented beta blocker date and time   History of Anesthesia Complications (+) PROLONGED EMERGENCE and history of anesthetic complications  Airway Mallampati: III  TM Distance: <3 FB     Dental  (+) Caps   Pulmonary asthma , Current Smoker,    Pulmonary exam normal        Cardiovascular negative cardio ROS Normal cardiovascular exam     Neuro/Psych  Headaches, PSYCHIATRIC DISORDERS Anxiety Depression Bipolar Disorder  Neuromuscular disease    GI/Hepatic Neg liver ROS, GERD  Medicated,Barrett's esophagus   Endo/Other  negative endocrine ROS  Renal/GU negative Renal ROS  negative genitourinary   Musculoskeletal  (+) Arthritis , Osteoarthritis,    Abdominal Normal abdominal exam  (+)   Peds negative pediatric ROS (+)  Hematology negative hematology ROS (+)   Anesthesia Other Findings Past Medical History: No date: Adnexal mass No date: Anxiety No date: Barrett's esophagus No date: Bell palsy     Comment:  left foot also ,  No date: Bronchitis No date: Complication of anesthesia     Comment:  difficulty waking up No date: Degenerative disc disease, cervical No date: Depression No date: Environmental and seasonal allergies No date: GERD (gastroesophageal reflux disease) No date: High triglycerides No date: Migraines No date: Migraines No date: Rapid heart rate  Reproductive/Obstetrics                             Anesthesia Physical  Anesthesia Plan  ASA: II  Anesthesia Plan: General   Post-op Pain Management:    Induction: Intravenous  PONV Risk Score and Plan: 3 and Ondansetron, Dexamethasone, Propofol and Midazolam  Airway Management Planned: Oral ETT  Additional Equipment:    Intra-op Plan:   Post-operative Plan: Extubation in OR  Informed Consent: I have reviewed the patients History and Physical, chart, labs and discussed the procedure including the risks, benefits and alternatives for the proposed anesthesia with the patient or authorized representative who has indicated his/her understanding and acceptance.   Dental advisory given  Plan Discussed with: CRNA and Surgeon  Anesthesia Plan Comments:        Anesthesia Quick Evaluation

## 2016-08-27 NOTE — OR Nursing (Signed)
Dr. Kenton Kingfisher in to see patient and discuss her post op course of care. Plan is to have patient eat crackers, get oral pain med, empty bladder and reassess. He will check on her again later.

## 2016-08-27 NOTE — Discharge Instructions (Addendum)
General Gynecological Post-Operative Instructions You may expect to feel dizzy, weak, and drowsy for as long as 24 hours after receiving the medicine that made you sleep (anesthetic).  Do not drive a car, ride a bicycle, participate in physical activities, or take public transportation until you are done taking narcotic pain medicines or as directed by your doctor.  Do not drink alcohol or take tranquilizers.  Do not take medicine that has not been prescribed by your doctor.  Do not sign important papers or make important decisions while on narcotic pain medicines.  Have a responsible person with you.  CARE OF INCISION  Keep incision clean and dry. Take showers instead of baths until your doctor gives you permission to take baths.  Avoid heavy lifting (more than 10 pounds/4.5 kilograms), pushing, or pulling.  Avoid activities that may risk injury to your surgical site.  No sexual intercourse or placement of anything in the vagina for 1 weeks or as instructed by your doctor. If you have tubes coming from the wound site, check with your doctor regarding appropriate care of the tubes. Only take prescription or over-the-counter medicines  for pain, discomfort, or fever as directed by your doctor. Do not take aspirin. It can make you bleed. Take medicines (antibiotics) that kill germs if they are prescribed for you.  Call the office or go to the ER if:  You feel sick to your stomach (nauseous) and you start to throw up (vomit).  You have trouble eating or drinking.  You have an oral temperature above 101.  You have constipation that is not helped by adjusting diet or increasing fluid intake. Pain medicines are a common cause of constipation.  You have any other concerns. SEEK IMMEDIATE MEDICAL CARE IF:  You have persistent dizziness.  You have difficulty breathing or a congested sounding (croupy) cough.  You have an oral temperature above 102.5, not controlled by medicine.  There is increasing  pain or tenderness near or in the surgical site.     AMBULATORY SURGERY  DISCHARGE INSTRUCTIONS   1) The drugs that you were given will stay in your system until tomorrow so for the next 24 hours you should not:  A) Drive an automobile B) Make any legal decisions C) Drink any alcoholic beverage   2) You may resume regular meals tomorrow.  Today it is better to start with liquids and gradually work up to solid foods.  You may eat anything you prefer, but it is better to start with liquids, then soup and crackers, and gradually work up to solid foods.   3) Please notify your doctor immediately if you have any unusual bleeding, trouble breathing, redness and pain at the surgery site, drainage, fever, or pain not relieved by medication.    4) Additional Instructions:Drink drink drink.  Keep  Your bladder empty and that should help some pressure sensations.  Use a pillow to support abdomen when coughing, sneezing, laughing or climbing stairs.  Make sure to take stool softeners when taking pain medications. If you can take tylenol or ibuprofen then take between doses of Norco to enhance the effects of narcotic.   Please contact your physician with any problems or Same Day Surgery at [phone], Monday through Friday 6 am to 4 pm, or [place] at [facility] number at [phone].

## 2016-08-27 NOTE — OR Nursing (Signed)
Pain med gave some relief for patient. Able to void and ambulate prior to discharge. Still complaining of some nausea but has medicine at home for this.  Family around her and supportive of her discharge home. Patient anxious but excited to try it at home!! Tolerating liquids and crackers.

## 2016-08-28 ENCOUNTER — Encounter: Payer: Self-pay | Admitting: Obstetrics & Gynecology

## 2016-08-30 ENCOUNTER — Ambulatory Visit: Payer: 59 | Admitting: Gastroenterology

## 2016-08-30 LAB — CYTOLOGY - NON PAP

## 2016-08-30 LAB — SURGICAL PATHOLOGY

## 2016-08-30 NOTE — Anesthesia Postprocedure Evaluation (Signed)
Anesthesia Post Note  [name]  Procedure(s) Performed: Procedure(s) (LRB): LAPAROSCOPIC SALPINGO OOPHORECTOMY (Bilateral)  Patient location during evaluation: PACU Anesthesia Type: General Level of consciousness: awake and alert and oriented Pain management: pain level controlled Vital Signs Assessment: post-procedure vital signs reviewed and stable Respiratory status: spontaneous breathing Cardiovascular status: blood pressure returned to baseline Anesthetic complications: no     Last Vitals:  Vitals:   08/27/16 1451 08/27/16 1613  BP: 100/61 (!) 97/50  Pulse: 67 69  Resp: 16 16  Temp: 36.5 C (!) 36.1 C    Last Pain:  Vitals:   08/27/16 1613  TempSrc: Temporal  PainSc: 7                  Berlie Hatchel

## 2016-09-06 ENCOUNTER — Telehealth: Payer: Self-pay

## 2016-09-06 NOTE — Telephone Encounter (Signed)
Pt had surgery 08/27/16 and stated this past Saturday she started choking on her coffee and since then she has been in more pain than immediately after surgery. Per pt she can't sleep, has chills and feels hot. Pt unsure if this is related to surgery or choking spell if she possibly pulled something to cause the pain. Please advise. Thank you.

## 2016-09-07 ENCOUNTER — Telehealth: Payer: Self-pay | Admitting: Gastroenterology

## 2016-09-07 NOTE — Telephone Encounter (Signed)
Pt has been informed that she does not have a CT scan ordered to reschedule at this time.  She informed me that she is still experiencing weight loss.  I advised her to give her body time to recover from the procedure she had on 07/27.  She said she has an appt this Friday for follow up with ob-gyn and has been advised to discuss what she can expect during this time.

## 2016-09-07 NOTE — Telephone Encounter (Signed)
Jordan Ortega called and had a ovarian mass removed 08/27/16 and Dr. Vicente Males wanted her to have a CT w/ contrast but patient is allergic to contrast and unable to go through that. Does Dr. Vicente Males want to r/s the CT if so patient needs her medication prior to CT w/ contrast so she doesn't have a reaction. Please call patient.

## 2016-09-07 NOTE — Telephone Encounter (Signed)
Pt states she is feeling a little better today and was able to sleep last night. Pt has post op on Friday 8/10.

## 2016-09-07 NOTE — Telephone Encounter (Signed)
When is appt?  She needs to be seen if still a problem.

## 2016-09-10 ENCOUNTER — Encounter: Payer: Self-pay | Admitting: Obstetrics & Gynecology

## 2016-09-10 ENCOUNTER — Ambulatory Visit (INDEPENDENT_AMBULATORY_CARE_PROVIDER_SITE_OTHER): Payer: BC Managed Care – PPO | Admitting: Obstetrics & Gynecology

## 2016-09-10 VITALS — BP 90/60 | Ht 60.0 in | Wt 97.0 lb

## 2016-09-10 DIAGNOSIS — N9489 Other specified conditions associated with female genital organs and menstrual cycle: Secondary | ICD-10-CM

## 2016-09-10 DIAGNOSIS — N949 Unspecified condition associated with female genital organs and menstrual cycle: Secondary | ICD-10-CM

## 2016-09-10 DIAGNOSIS — Z1231 Encounter for screening mammogram for malignant neoplasm of breast: Secondary | ICD-10-CM

## 2016-09-10 DIAGNOSIS — Z1239 Encounter for other screening for malignant neoplasm of breast: Secondary | ICD-10-CM

## 2016-09-10 NOTE — Progress Notes (Signed)
  Postoperative Follow-up Patient presents post op from bilateral oophorectomy for adnexal mass, 2 weeks ago.  Subjective: Patient reports marked improvement in her preop symptoms. Eating a regular diet without difficulty. The patient is not having any pain.  Activity: normal activities of daily living. Patient reports vaginal sx's of None  Objective: BP 90/60   Ht 5' (1.524 m)   Wt 97 lb (44 kg)   BMI 18.94 kg/m  Physical Exam  Constitutional: She is oriented to person, place, and time. She appears well-developed and well-nourished. No distress.  Cardiovascular: Normal rate.   Pulmonary/Chest: Effort normal.  Abdominal: Soft. She exhibits no distension. There is no tenderness.  Incision Healing Well   Musculoskeletal: Normal range of motion.  Neurological: She is alert and oriented to person, place, and time. No cranial nerve deficit.  Skin: Skin is warm and dry.  Psychiatric: She has a normal mood and affect.    Assessment: s/p :  bilateral oophorectomy stable  Plan: Patient has done well after surgery with no apparent complications.  I have discussed the post-operative course to date, and the expected progress moving forward.  The patient understands what complications to be concerned about.  I will see the patient in routine follow up, or sooner if needed.    Pathology discussed (Mucinous cystadenoma)  Activity plan: No restriction.  MMG soon  Yearly follow up or for menopausal sx's  Hoyt Koch 09/10/2016, 3:34 PM

## 2016-09-21 ENCOUNTER — Ambulatory Visit
Admission: RE | Admit: 2016-09-21 | Discharge: 2016-09-21 | Disposition: A | Discharge status: Home / Self Care | Payer: BC Managed Care – PPO | Source: Ambulatory Visit | Attending: Obstetrics & Gynecology | Admitting: Obstetrics & Gynecology

## 2016-09-21 ENCOUNTER — Encounter: Payer: Self-pay | Admitting: Obstetrics & Gynecology

## 2016-09-21 DIAGNOSIS — Z1239 Encounter for other screening for malignant neoplasm of breast: Secondary | ICD-10-CM

## 2016-09-21 DIAGNOSIS — Z1231 Encounter for screening mammogram for malignant neoplasm of breast: Secondary | ICD-10-CM | POA: Insufficient documentation

## 2016-10-05 DIAGNOSIS — R002 Palpitations: Secondary | ICD-10-CM | POA: Insufficient documentation

## 2016-10-21 DIAGNOSIS — I493 Ventricular premature depolarization: Secondary | ICD-10-CM | POA: Insufficient documentation

## 2016-10-25 ENCOUNTER — Other Ambulatory Visit: Payer: Self-pay | Admitting: Physician Assistant

## 2016-10-25 DIAGNOSIS — K2270 Barrett's esophagus without dysplasia: Secondary | ICD-10-CM

## 2016-11-19 ENCOUNTER — Ambulatory Visit (INDEPENDENT_AMBULATORY_CARE_PROVIDER_SITE_OTHER): Payer: BC Managed Care – PPO | Admitting: Family Medicine

## 2016-11-19 ENCOUNTER — Encounter: Payer: Self-pay | Admitting: Family Medicine

## 2016-11-19 VITALS — BP 106/74 | HR 57 | Temp 98.9°F | Wt 106.2 lb

## 2016-11-19 DIAGNOSIS — J45909 Unspecified asthma, uncomplicated: Secondary | ICD-10-CM | POA: Diagnosis not present

## 2016-11-19 DIAGNOSIS — F3175 Bipolar disorder, in partial remission, most recent episode depressed: Secondary | ICD-10-CM | POA: Diagnosis not present

## 2016-11-19 DIAGNOSIS — G43009 Migraine without aura, not intractable, without status migrainosus: Secondary | ICD-10-CM

## 2016-11-19 DIAGNOSIS — L309 Dermatitis, unspecified: Secondary | ICD-10-CM

## 2016-11-19 MED ORDER — ALPRAZOLAM 0.5 MG PO TABS: 0.5000 mg | ORAL_TABLET | Freq: Three times a day (TID) | ORAL | 0 refills | Status: AC | PRN

## 2016-11-19 MED ORDER — PREDNISONE 5 MG PO TABS: ORAL_TABLET | ORAL | 0 refills | Status: DC

## 2016-11-19 MED ORDER — PROMETHAZINE HCL 25 MG PO TABS: 25.0000 mg | ORAL_TABLET | Freq: Three times a day (TID) | ORAL | 0 refills | Status: DC | PRN

## 2016-11-19 MED ORDER — AMOXICILLIN 875 MG PO TABS: 875.0000 mg | ORAL_TABLET | Freq: Two times a day (BID) | ORAL | 0 refills | Status: DC

## 2016-11-19 NOTE — Progress Notes (Signed)
[name] Female    DOB: Dec 16, 1955   61 y.o.   MRN: [id number] Visit Date: 11/19/2016  Today's Provider: Vernie Murders, PA   Chief Complaint  Patient presents with  . Chest tightness   Subjective:    HPI Patient is here today with complaints of chest tightness when laying down. She states the symptoms started 1 week ago. She denies cough and congestion. She has been taking Mucinex for symptoms with no relief. Patient has a PMH of recurrent bronchitis and symptoms symptoms are similar. Cough worse early morning and at night. No sputum production or fever. Both ears having drainage and crusting in openings. Using Loratadine BID with equal parts of rubbing alcohol with white vinegar drops up to TID.  Past Medical History:  Diagnosis Date  . Adnexal mass   . Anxiety   . Barrett's esophagus   . Bell palsy    left foot also ,   . Bronchitis   . Complication of anesthesia    difficulty waking up  . Degenerative disc disease, cervical   . Depression   . Environmental and seasonal allergies   . GERD (gastroesophageal reflux disease)   . High triglycerides   . Migraines   . Migraines   . Rapid heart rate    Past Surgical History:  Procedure Laterality Date  . ABDOMINAL HYSTERECTOMY    . APPENDECTOMY    . CHOLECYSTECTOMY    . COLONOSCOPY WITH PROPOFOL N/A 07/16/2016   Procedure: COLONOSCOPY WITH PROPOFOL;  Surgeon: [name], MD;  Location: [facility] ENDOSCOPY;  Service: Endoscopy;  Laterality: N/A;  . ESOPHAGOGASTRODUODENOSCOPY (EGD) WITH PROPOFOL N/A 07/16/2016   Procedure: ESOPHAGOGASTRODUODENOSCOPY (EGD) WITH PROPOFOL;  Surgeon: [name], MD;  Location: [facility] ENDOSCOPY;  Service: Endoscopy;  Laterality: N/A;  . [place]  . LAPAROSCOPIC SALPINGO OOPHERECTOMY Bilateral 08/27/2016   Procedure: LAPAROSCOPIC SALPINGO OOPHORECTOMY;  Surgeon: Gae Dry, MD;  Location: [facility] ORS;  Service: Gynecology;  Laterality: Bilateral;  . TUBAL LIGATION   1983   Allergies  Allergen Reactions  . Diazepam Other (See Comments)    Other reaction(s): Hallucination Patient states "it makes me crazy" REACTION: Behavioral problems  . Ciprofloxacin Other (See Comments)    Other reaction(s): Unknown Loopy  . Codeine Hives and Itching    REACTION: Dizziness  . Morphine Hives    Other reaction(s): Unknown  . Niacin And Related Other (See Comments)    flushing  . Toradol [Ketorolac Tromethamine] Itching    Tolerates when taken with Benadryl  . Iodinated Diagnostic Agents Hives    Pt states she had a CT Head with contrast in the 90's and broke out in hives after the injection. SPM     Previous Medications   ALBUTEROL (PROVENTIL HFA;VENTOLIN HFA) 108 (90 BASE) MCG/ACT INHALER    Inhale 2 puffs into the lungs every 6 (six) hours as needed for wheezing or shortness of breath.   ALPRAZOLAM (XANAX) 0.5 MG TABLET    Take 1 tablet (0.5 mg total) by mouth 3 (three) times daily as needed for anxiety.   ASPIRIN EC 81 MG TABLET    Take 81 mg by mouth daily.   ASPIRIN-ACETAMINOPHEN-CAFFEINE (EXCEDRIN MIGRAINE) 250-250-65 MG TABLET    Take 1 tablet by mouth every 6 (six) hours as needed for migraine.   BACLOFEN (LIORESAL) 10 MG TABLET    TAKE 1 TABLET (10 MG TOTAL) BY MOUTH 2 (TWO) TIMES DAILY   GABAPENTIN (NEURONTIN) 300 MG CAPSULE  TAKE 1 CAPSULE (300 MG TOTAL) BY MOUTH NIGHTLY.   HYDROCODONE-ACETAMINOPHEN (NORCO/VICODIN) 5-325 MG TABLET    Take 1 tablet by mouth every 6 (six) hours as needed. For pain   LORATADINE (CLARITIN) 10 MG TABLET    Take 10 mg by mouth 2 (two) times daily.   METOPROLOL TARTRATE (LOPRESSOR) 25 MG TABLET    Take 25 mg by mouth 2 (two) times daily.    PANTOPRAZOLE (PROTONIX) 40 MG TABLET    TAKE ONE TABLET BY MOUTH DAILY   PROBIOTIC PRODUCT (PROBIOTIC-10) CHEW    Chew 2 tablets by mouth daily.   QUETIAPINE (SEROQUEL) 50 MG TABLET    Take 1 tablet (50 mg total) by mouth at bedtime.   SIMVASTATIN (ZOCOR) 40 MG TABLET    TAKE 1 TABLET  BY MOUTH NIGHTLY AT BEDTIME   SPECIALTY VITAMINS PRODUCTS (BIOTIN PLUS KERATIN) 10000-100 MCG-MG TABS    Take 1 tablet by mouth daily.    Review of Systems  Constitutional: Negative.   Respiratory: Positive for chest tightness.   Cardiovascular: Negative.     Social History  Substance Use Topics  . Smoking status: Current Every Day Smoker    Packs/day: 0.50    Years: 30.00    Types: Cigarettes  . Smokeless tobacco: Never Used  . Alcohol use 0.6 oz/week    1 Cans of beer per week     Comment: occasional   Objective:   BP 106/74 (BP Location: Right Arm, Patient Position: Sitting, Cuff Size: Normal)   Pulse (!) 57   Temp 98.9 F (37.2 C) (Oral)   Wt 106 lb 3.2 oz (48.2 kg)   SpO2 97%   BMI 20.74 kg/m   Physical Exam  Constitutional: She is oriented to person, place, and time. She appears well-developed and well-nourished. No distress.  HENT:  Head: Normocephalic and atraumatic.  Right Ear: Hearing normal.  Left Ear: Hearing normal.  Nose: Nose normal.  Slightly injected posterior tonsillar pillars. No exudates or significant erythema. Both ears have dry flaking to auricles at canal opening. TM's with good reflex and no perforations, redness or drainage.  Eyes: Conjunctivae and lids are normal. Right eye exhibits no discharge. Left eye exhibits no discharge. No scleral icterus.  Neck: Neck supple.  Cardiovascular: Normal rate.   Pulmonary/Chest: Effort normal and breath sounds normal. No respiratory distress. She has no rales.  Slightly coarse breath sounds.  Abdominal: Soft.  Musculoskeletal: Normal range of motion.  Neurological: She is alert and oriented to person, place, and time.  Skin: Skin is intact. No lesion and no rash noted.  Psychiatric: She has a normal mood and affect. Her speech is normal and behavior is normal. Thought content normal.      Assessment & Plan:     1. Mild reactive airways disease, unspecified whether persistent Some sensation wheezing  (none heard today) without fever or sputum production. Some chest tightness when lying down with coughing occasionally. Has not had to use Ventolin recently. Still smoking 1/2 ppd for 30 years. Some irritation to posterior pharynx without exudates. Will treat with prednisone taper and given Amoxil to use of purulent sputum with fever starts over the weekend. Recheck prn. - predniSONE (DELTASONE) 5 MG tablet; Taper dosage by mouth at meal times as directed (6,5,4,3,2,1)  Dispense: 21 tablet; Refill: 0 - amoxicillin (AMOXIL) 875 MG tablet; Take 1 tablet (875 mg total) by mouth 2 (two) times daily.  Dispense: 20 tablet; Refill: 0  2. Migraine without aura and without  status migrainosus, not intractable Rarely having migraine with use of Seroquel daily and good sleep habits. If headache occurs, Promethazine will help it stop and control nausea. Metoprolol 25 mg BID helps control palpitations (evaluated by cardiologist - Dr. Saralyn Pilar) and prevent more frequent migraines. Refilled Promethazine for prn use (used less than 30 tablets in the past year). - promethazine (PHENERGAN) 25 MG tablet; Take 1 tablet (25 mg total) by mouth every 8 (eight) hours as needed for nausea or vomiting.  Dispense: 30 tablet; Refill: 0  3. Dermatitis of external ear Persistent dryness in both external ears. Should stop using the alcohol with white vinegar - probably causing more dryness and flaking. May use Hydrocortisone Cream to external ear rash BID. If no further improvement, may need referral to ENT.  4. Bipolar disorder, in partial remission, most recent episode depressed (HCC) Controlled by Seroquel 75 mg qd and Alprazolam 0,5 mg TID when anxiety flares (used #90 tablets in the past year). Refilled Xanax and recommend she continue the Seroquel daily. No suicidal ideation or overwhelming mania. If control not maintained, will need recheck with psychiatrist. - ALPRAZolam (XANAX) 0.5 MG tablet; Take 1 tablet (0.5 mg total) by  mouth 3 (three) times daily as needed for anxiety.  Dispense: 90 tablet; Refill: 0

## 2016-11-25 ENCOUNTER — Telehealth: Payer: Self-pay | Admitting: Family Medicine

## 2016-11-25 NOTE — Telephone Encounter (Signed)
Please advise 

## 2016-11-25 NOTE — Telephone Encounter (Signed)
LMOVM for pt to return call 

## 2016-11-25 NOTE — Telephone Encounter (Signed)
Patient was notified.

## 2016-11-25 NOTE — Telephone Encounter (Signed)
Pt states she was in last week for chest tightness.  Pt states she was given a antibiotic to take if she has any other symptoms.  Pt states she now have an unproductive cough, wheezing at bedtime and still having chest tightness.  Pt is asking should she start the antibiotic?  AL#[phone]/DZ

## 2016-11-25 NOTE — Telephone Encounter (Signed)
Yes and use inhaler for wheezing as needed.

## 2017-01-06 ENCOUNTER — Ambulatory Visit (INDEPENDENT_AMBULATORY_CARE_PROVIDER_SITE_OTHER): Payer: BC Managed Care – PPO | Admitting: Physician Assistant

## 2017-01-06 ENCOUNTER — Encounter: Payer: Self-pay | Admitting: Physician Assistant

## 2017-01-06 VITALS — BP 102/72 | HR 62 | Temp 97.9°F | Resp 16

## 2017-01-06 DIAGNOSIS — G43411 Hemiplegic migraine, intractable, with status migrainosus: Secondary | ICD-10-CM | POA: Diagnosis not present

## 2017-01-06 MED ORDER — PROMETHAZINE HCL 25 MG/ML IJ SOLN
25.0000 mg | Freq: Once | INTRAMUSCULAR | Status: AC
  Administered 2017-01-06: 25 mg via INTRAMUSCULAR

## 2017-01-06 MED ORDER — DIPHENHYDRAMINE HCL 50 MG/ML IJ SOLN
50.0000 mg | Freq: Once | INTRAMUSCULAR | Status: AC
  Administered 2017-01-06: 50 mg via INTRAMUSCULAR

## 2017-01-06 MED ORDER — KETOROLAC TROMETHAMINE 30 MG/ML IJ SOLN
30.0000 mg | Freq: Once | INTRAMUSCULAR | Status: AC
  Administered 2017-01-06: 30 mg via INTRAMUSCULAR

## 2017-01-06 NOTE — Patient Instructions (Signed)
Migraine Headache A migraine headache is an intense, throbbing pain on one side or both sides of the head. Migraines may also cause other symptoms, such as nausea, vomiting, and sensitivity to light and noise. What are the causes? Doing or taking certain things may also trigger migraines, such as:  Alcohol.  Smoking.  Medicines, such as: ? Medicine used to treat chest pain (nitroglycerine). ? Birth control pills. ? Estrogen pills. ? Certain blood pressure medicines.  Aged cheeses, chocolate, or caffeine.  Foods or drinks that contain nitrates, glutamate, aspartame, or tyramine.  Physical activity.  Other things that may trigger a migraine include:  Menstruation.  Pregnancy.  Hunger.  Stress, lack of sleep, too much sleep, or fatigue.  Weather changes.  What increases the risk? The following factors may make you more likely to experience migraine headaches:  Age. Risk increases with age.  Family history of migraine headaches.  Being Caucasian.  Depression and anxiety.  Obesity.  Being a woman.  Having a hole in the heart (patent foramen ovale) or other heart problems.  What are the signs or symptoms? The main symptom of this condition is pulsating or throbbing pain. Pain may:  Happen in any area of the head, such as on one side or both sides.  Interfere with daily activities.  Get worse with physical activity.  Get worse with exposure to bright lights or loud noises.  Other symptoms may include:  Nausea.  Vomiting.  Dizziness.  General sensitivity to bright lights, loud noises, or smells.  Before you get a migraine, you may get warning signs that a migraine is developing (aura). An aura may include:  Seeing flashing lights or having blind spots.  Seeing bright spots, halos, or zigzag lines.  Having tunnel vision or blurred vision.  Having numbness or a tingling feeling.  Having trouble talking.  Having muscle weakness.  How is this  diagnosed? A migraine headache can be diagnosed based on:  Your symptoms.  A physical exam.  Tests, such as CT scan or MRI of the head. These imaging tests can help rule out other causes of headaches.  Taking fluid from the spine (lumbar puncture) and analyzing it (cerebrospinal fluid analysis, or CSF analysis).  How is this treated? A migraine headache is usually treated with medicines that:  Relieve pain.  Relieve nausea.  Prevent migraines from coming back.  Treatment may also include:  Acupuncture.  Lifestyle changes like avoiding foods that trigger migraines.  Follow these instructions at home: Medicines  Take over-the-counter and prescription medicines only as told by your health care provider.  Do not drive or use heavy machinery while taking prescription pain medicine.  To prevent or treat constipation while you are taking prescription pain medicine, your health care provider may recommend that you: ? Drink enough fluid to keep your urine clear or pale yellow. ? Take over-the-counter or prescription medicines. ? Eat foods that are high in fiber, such as fresh fruits and vegetables, whole grains, and beans. ? Limit foods that are high in fat and processed sugars, such as fried and sweet foods. Lifestyle  Avoid alcohol use.  Do not use any products that contain nicotine or tobacco, such as cigarettes and e-cigarettes. If you need help quitting, ask your health care provider.  Get at least 8 hours of sleep every night.  Limit your stress. General instructions   Keep a journal to find out what may trigger your migraine headaches. For example, write down: ? What you eat and   drink. ? How much sleep you get. ? Any change to your diet or medicines.  If you have a migraine: ? Avoid things that make your symptoms worse, such as bright lights. ? It may help to lie down in a dark, quiet room. ? Do not drive or use heavy machinery. ? Ask your health care provider  what activities are safe for you while you are experiencing symptoms.  Keep all follow-up visits as told by your health care provider. This is important. Contact a health care provider if:  You develop symptoms that are different or more severe than your usual migraine symptoms. Get help right away if:  Your migraine becomes severe.  You have a fever.  You have a stiff neck.  You have vision loss.  Your muscles feel weak or like you cannot control them.  You start to lose your balance often.  You develop trouble walking.  You faint. This information is not intended to replace advice given to you by your health care provider. Make sure you discuss any questions you have with your health care provider. Document Released: 01/18/2005 Document Revised: 08/08/2015 Document Reviewed: 07/07/2015 Elsevier Interactive Patient Education  2017 [employer] Inc[employer]   

## 2017-01-06 NOTE — Progress Notes (Signed)
[name] Female    DOB: [birth year]   61 y.o.   MRN: [id number] Visit Date: 01/06/2017  Today's Provider: Mar Daring, PA-C   Chief Complaint  Patient presents with  . Migraine   Subjective:    Migraine   This is a chronic problem. The current episode started today. The problem occurs constantly. The problem has been gradually worsening. The pain is located in the left unilateral region. The pain radiates to the face. The pain quality is similar to prior headaches (has been a while; does have history of hemiplegic migraines always affecting left side of face). The quality of the pain is described as aching, throbbing and stabbing (Pounding). The pain is at a severity of 10/10. The pain is severe. Associated symptoms include blurred vision, dizziness, a loss of balance (some), nausea, neck pain, numbness and photophobia. Pertinent negatives include no abdominal pain, eye watering, facial sweating, fever, sinus pressure, tinnitus, visual change, vomiting or weakness. The symptoms are aggravated by bright light and noise. Treatments tried: IBU, VC powder. The treatment provided no relief.      Allergies  Allergen Reactions  . Diazepam Other (See Comments)    Other reaction(s): Hallucination Patient states "it makes me crazy" REACTION: Behavioral problems  . Ciprofloxacin Other (See Comments)    Other reaction(s): Unknown Loopy  . Codeine Hives and Itching    REACTION: Dizziness  . Morphine Hives    Other reaction(s): Unknown  . Niacin And Related Other (See Comments)    flushing  . Toradol [Ketorolac Tromethamine] Itching    Tolerates when taken with Benadryl  . Iodinated Diagnostic Agents Hives    Pt states she had a CT Head with contrast in the 90's and broke out in hives after the injection. SPM     Current Outpatient Medications:  .  albuterol (PROVENTIL HFA;VENTOLIN HFA) 108 (90 Base) MCG/ACT inhaler, Inhale 2 puffs into the lungs every 6  (six) hours as needed for wheezing or shortness of breath., Disp: 1 Inhaler, Rfl: 2 .  ALPRAZolam (XANAX) 0.5 MG tablet, Take 1 tablet (0.5 mg total) by mouth 3 (three) times daily as needed for anxiety., Disp: 90 tablet, Rfl: 0 .  aspirin EC 81 MG tablet, Take 81 mg by mouth daily., Disp: , Rfl:  .  baclofen (LIORESAL) 10 MG tablet, TAKE 1 TABLET (10 MG TOTAL) BY MOUTH 2 (TWO) TIMES DAILY, Disp: , Rfl: 2 .  gabapentin (NEURONTIN) 300 MG capsule, TAKE 1 CAPSULE (300 MG TOTAL) BY MOUTH NIGHTLY., Disp: , Rfl: 3 .  HYDROcodone-acetaminophen (NORCO/VICODIN) 5-325 MG tablet, Take 1 tablet by mouth every 6 (six) hours as needed. For pain (Patient taking differently: Take 1 tablet by mouth every 6 (six) hours as needed. For migraines), Disp: 30 tablet, Rfl: 0 .  loratadine (CLARITIN) 10 MG tablet, Take 10 mg by mouth 2 (two) times daily., Disp: , Rfl:  .  pantoprazole (PROTONIX) 40 MG tablet, TAKE ONE TABLET BY MOUTH DAILY, Disp: 90 tablet, Rfl: 1 .  Probiotic Product (PROBIOTIC-10) CHEW, Chew 2 tablets by mouth daily., Disp: , Rfl:  .  QUEtiapine (SEROQUEL) 50 MG tablet, Take 1 tablet (50 mg total) by mouth at bedtime. (Patient taking differently: Take 75 mg by mouth at bedtime. Takes 1.5 tablets), Disp: 90 tablet, Rfl: 3 .  simvastatin (ZOCOR) 40 MG tablet, TAKE 1 TABLET BY MOUTH NIGHTLY AT BEDTIME, Disp: 30 tablet, Rfl: 5 .  Specialty Vitamins Products (BIOTIN PLUS  KERATIN) 10000-100 MCG-MG TABS, Take 1 tablet by mouth daily., Disp: , Rfl:  .  amoxicillin (AMOXIL) 875 MG tablet, Take 1 tablet (875 mg total) by mouth 2 (two) times daily. (Patient not taking: Reported on 01/06/2017), Disp: 20 tablet, Rfl: 0 .  aspirin-acetaminophen-caffeine (EXCEDRIN MIGRAINE) 250-250-65 MG tablet, Take 1 tablet by mouth every 6 (six) hours as needed for migraine., Disp: , Rfl:  .  metoprolol tartrate (LOPRESSOR) 25 MG tablet, Take 25 mg by mouth 2 (two) times daily. , Disp: , Rfl:  .  predniSONE (DELTASONE) 5 MG tablet,  Taper dosage by mouth at meal times as directed (4,6,2,7,0,3) (Patient not taking: Reported on 01/06/2017), Disp: 21 tablet, Rfl: 0 .  promethazine (PHENERGAN) 25 MG tablet, Take 1 tablet (25 mg total) by mouth every 8 (eight) hours as needed for nausea or vomiting. (Patient not taking: Reported on 01/06/2017), Disp: 30 tablet, Rfl: 0  Review of Systems  Constitutional: Negative for fever.  HENT: Negative for sinus pressure and tinnitus.   Eyes: Positive for blurred vision and photophobia.  Respiratory: Negative for chest tightness, shortness of breath and wheezing.   Cardiovascular: Negative for chest pain, palpitations and leg swelling.  Gastrointestinal: Positive for nausea. Negative for abdominal pain and vomiting.  Musculoskeletal: Positive for neck pain. Negative for neck stiffness.  Neurological: Positive for dizziness, facial asymmetry, numbness, headaches and loss of balance (some). Negative for weakness.    Social History   Tobacco Use  . Smoking status: Current Every Day Smoker    Packs/day: 0.50    Years: 30.00    Pack years: 15.00    Types: Cigarettes  . Smokeless tobacco: Never Used  Substance Use Topics  . Alcohol use: Yes    Alcohol/week: 0.6 oz    Types: 1 Cans of beer per week    Comment: occasional   Objective:   BP 102/72 (BP Location: Right Arm, Patient Position: Lying right side, Cuff Size: Normal)   Pulse 62   Temp 97.9 F (36.6 C) (Oral)   Resp 16  Vitals:   01/06/17 1111  BP: 102/72  Pulse: 62  Resp: 16  Temp: 97.9 F (36.6 C)  TempSrc: Oral     Physical Exam  Constitutional: She appears well-developed and well-nourished. No distress.  HENT:  Head: Normocephalic and atraumatic.  Right Ear: Hearing, tympanic membrane, external ear and ear canal normal.  Left Ear: Hearing, tympanic membrane, external ear and ear canal normal.  Mouth/Throat: Uvula is midline, oropharynx is clear and moist and mucous membranes are normal.  Left side of face  paralysis  Neck: Normal range of motion. Neck supple.  Cardiovascular: Normal rate, regular rhythm and normal heart sounds. Exam reveals no gallop and no friction rub.  No murmur heard. Pulmonary/Chest: Effort normal and breath sounds normal. No respiratory distress. She has no wheezes. She has no rales.  Skin: She is not diaphoretic.  Vitals reviewed.       Assessment & Plan:     1. Intractable hemiplegic migraine with status migrainosus IM toradol, benadryl, and phenergan given today. Patient sat for 15 minutes and tolerated well. She was given strict return precautions or reasons to call 9-1-1. - ketorolac (TORADOL) 30 MG/ML injection 30 mg - promethazine (PHENERGAN) injection 25 mg - diphenhydrAMINE (BENADRYL) injection 50 mg       Mar Daring, PA-C  [place]

## 2017-01-20 ENCOUNTER — Other Ambulatory Visit: Payer: Self-pay

## 2017-01-20 ENCOUNTER — Emergency Department: Payer: BC Managed Care – PPO

## 2017-01-20 ENCOUNTER — Emergency Department
Admission: EM | Admit: 2017-01-20 | Discharge: 2017-01-20 | Disposition: A | Discharge status: Home / Self Care | Payer: BC Managed Care – PPO | Attending: Emergency Medicine | Admitting: Emergency Medicine

## 2017-01-20 ENCOUNTER — Encounter: Payer: Self-pay | Admitting: Emergency Medicine

## 2017-01-20 DIAGNOSIS — F17210 Nicotine dependence, cigarettes, uncomplicated: Secondary | ICD-10-CM | POA: Diagnosis not present

## 2017-01-20 DIAGNOSIS — Z79899 Other long term (current) drug therapy: Secondary | ICD-10-CM | POA: Diagnosis not present

## 2017-01-20 DIAGNOSIS — Z7982 Long term (current) use of aspirin: Secondary | ICD-10-CM | POA: Diagnosis not present

## 2017-01-20 DIAGNOSIS — G43109 Migraine with aura, not intractable, without status migrainosus: Secondary | ICD-10-CM

## 2017-01-20 DIAGNOSIS — R42 Dizziness and giddiness: Secondary | ICD-10-CM | POA: Diagnosis present

## 2017-01-20 LAB — URINALYSIS, COMPLETE (UACMP) WITH MICROSCOPIC
Bacteria, UA: NONE SEEN
Bilirubin Urine: NEGATIVE
GLUCOSE, UA: NEGATIVE mg/dL
Ketones, ur: NEGATIVE mg/dL
LEUKOCYTES UA: NEGATIVE
Nitrite: NEGATIVE
PH: 5 (ref 5.0–8.0)
PROTEIN: NEGATIVE mg/dL
Specific Gravity, Urine: 1.009 (ref 1.005–1.030)

## 2017-01-20 LAB — BASIC METABOLIC PANEL
Anion gap: 7 (ref 5–15)
BUN: 19 mg/dL (ref 6–20)
CALCIUM: 8.8 mg/dL — AB (ref 8.9–10.3)
CO2: 24 mmol/L (ref 22–32)
CREATININE: 0.86 mg/dL (ref 0.44–1.00)
Chloride: 110 mmol/L (ref 101–111)
GFR calc non Af Amer: >60 mL/min (ref >60)
Glucose, Bld: 98 mg/dL (ref 65–99)
Potassium: 3.4 mmol/L — ABNORMAL LOW (ref 3.5–5.1)
SODIUM: 141 mmol/L (ref 135–145)

## 2017-01-20 LAB — CBC
HCT: 39.9 % (ref 35.0–47.0)
Hemoglobin: 13.5 g/dL (ref 12.0–16.0)
MCH: 32.8 pg (ref 26.0–34.0)
MCHC: 33.9 g/dL (ref 32.0–36.0)
MCV: 97 fL (ref 80.0–100.0)
PLATELETS: 198 10*3/uL (ref 150–440)
RBC: 4.12 MIL/uL (ref 3.80–5.20)
RDW: 13.6 % (ref 11.5–14.5)
WBC: 7.8 10*3/uL (ref 3.6–11.0)

## 2017-01-20 LAB — TROPONIN I: Troponin I: <0.03 ng/mL (ref <0.03)

## 2017-01-20 MED ORDER — KETOROLAC TROMETHAMINE 30 MG/ML IJ SOLN
30.0000 mg | Freq: Once | INTRAMUSCULAR | Status: AC
  Administered 2017-01-20: 30 mg via INTRAMUSCULAR
  Filled 2017-01-20: qty 1

## 2017-01-20 MED ORDER — DIPHENHYDRAMINE HCL 50 MG/ML IJ SOLN
25.0000 mg | Freq: Once | INTRAMUSCULAR | Status: AC
  Administered 2017-01-20: 25 mg via INTRAMUSCULAR
  Filled 2017-01-20: qty 1

## 2017-01-20 MED ORDER — PROMETHAZINE HCL 25 MG/ML IJ SOLN
25.0000 mg | Freq: Once | INTRAMUSCULAR | Status: AC
  Administered 2017-01-20: 25 mg via INTRAMUSCULAR
  Filled 2017-01-20: qty 1

## 2017-01-20 NOTE — ED Notes (Signed)
ED Provider at bedside. 

## 2017-01-20 NOTE — ED Notes (Signed)
Pt reports dizziness x 2-3 weeks with headache at this time. Pt husband states that she switched jobs about 2 months ago and has felt a lot more fatigued since then. New job involves sitting at a desk for 8 hours a day. Pt reports swelling in bilateral feet that worsen at night since the new job began. Slight droop to left side of mouth, which husband says happens when she gets migraine headaches. This is not a new symptom for pt.

## 2017-01-20 NOTE — ED Notes (Signed)
Pt on the phone with MRI for screening questions now.

## 2017-01-20 NOTE — ED Triage Notes (Signed)
Pt here for increasing dizziness over last 2 weeks. Today was at work and felt suddenly tired and weak all over. Called EMS to check BP and they brought her here.  No pain or fevers. No NVD.  Color WNL. VSS.

## 2017-01-20 NOTE — ED Notes (Signed)
First nurse note: Pt arrived via EMS from work for reports of increasing dizziness for two weeks. Pt reports history of HTN, takes labetolol. Pt reports blood pressure has been elevated. Pt reports history of migraine headaches and reports headache to pt. EMS reports VSS, no neuro deficits noted.

## 2017-01-20 NOTE — ED Notes (Signed)
Pt ambulatory to wheel chair upon discharge. Pt and significant other verbalized understanding of discharge instructions and follow-up care. VSS. A&O x4. Skin warm and dry.

## 2017-01-20 NOTE — ED Provider Notes (Signed)
[facility] Emergency Department Provider Note   ____________________________________________    I have reviewed the triage vital signs and the nursing notes.   HISTORY  Chief Complaint Dizziness     HPI Jordan Ortega is a 61 y.o. female who presents with complaints of dizziness.  Patient reports she went to work today and felt "not well "she asked her coworker to take her blood pressure who became concerned and called EMS.  Husband reports that he noted at 62 this morning that she had a left facial droop, patient reports a history of complex migraines and whenever she has a migraine she has a left facial droop.  Patient reports she last had a migraine 3 weeks ago treated by her PCP at which point she did have a facial droop which then resolved when her migraine got better.  She has a mild headache currently but not a migraine.  She denies other weakness.  No chest pain or palpitations.  No shortness of breath cough nausea or vomiting.  Currently she feels well   Past Medical History:  Diagnosis Date  . Adnexal mass   . Anxiety   . Barrett's esophagus   . Bell palsy    left foot also ,   . Bronchitis   . Complication of anesthesia    difficulty waking up  . Degenerative disc disease, cervical   . Depression   . Environmental and seasonal allergies   . GERD (gastroesophageal reflux disease)   . High triglycerides   . Migraines   . Migraines   . Rapid heart rate     Patient Active Problem List   Diagnosis Date Noted  . Premature ventricular contractions 10/21/2016  . Heart palpitations 10/05/2016  . Cyst of right ovary 08/18/2016  . Adnexal mass 08/17/2016  . Bilateral carpal tunnel syndrome 04/08/2016  . Bilateral hand pain 04/08/2016  . 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 Acute onset aura migraine 07/10/2014  . Bad memory 07/10/2014  . Migraine without aura 07/10/2014  . Abdominal pain, epigastric 07/10/2014  . Chills with fever  07/10/2014  . Chronic constipation 07/10/2014  . Migraines   . Migraine 11/28/2011  . Weakness of left side of body 11/28/2011  . Acute anxiety 11/20/2009  . DEPRESSION 11/20/2009  . FOOT PAIN 11/20/2009  . Hypercholesterolemia without hypertriglyceridemia 06/18/2009  . 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 Malaise and fatigue 10/29/2008  . Abnormal loss of weight 10/29/2008  . 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 Affective bipolar disorder ([place]) 05/17/2008  . Barrett esophagus 05/17/2008    Past Surgical History:  Procedure Laterality Date  . ABDOMINAL HYSTERECTOMY    . APPENDECTOMY    . CHOLECYSTECTOMY    . COLONOSCOPY WITH PROPOFOL N/A 07/16/2016   Procedure: COLONOSCOPY WITH PROPOFOL;  Surgeon: [name], MD;  Location: [facility] ENDOSCOPY;  Service: Endoscopy;  Laterality: N/A;  . ESOPHAGOGASTRODUODENOSCOPY (EGD) WITH PROPOFOL N/A 07/16/2016   Procedure: ESOPHAGOGASTRODUODENOSCOPY (EGD) WITH PROPOFOL;  Surgeon: [name], MD;  Location: [facility] ENDOSCOPY;  Service: Endoscopy;  Laterality: N/A;  . [place]  . LAPAROSCOPIC SALPINGO OOPHERECTOMY Bilateral 08/27/2016   Procedure: LAPAROSCOPIC SALPINGO OOPHORECTOMY;  Surgeon: Gae Dry, MD;  Location: [facility] ORS;  Service: Gynecology;  Laterality: Bilateral;  . TUBAL LIGATION  1983    Prior to Admission medications   Medication Sig Start Date End Date Taking? Authorizing Provider  albuterol (PROVENTIL HFA;VENTOLIN HFA) 108 (  90 Base) MCG/ACT inhaler Inhale 2 puffs into the lungs every 6 (six) hours as needed for wheezing or shortness of breath. 12/02/15   Rudene Re, MD  ALPRAZolam Duanne Moron) 0.5 MG tablet Take 1 tablet (0.5 mg total) by mouth 3 (three) times daily as needed for anxiety. 11/19/16   Chrismon, Vickki Muff, PA  amoxicillin (AMOXIL) 875 MG tablet Take 1 tablet (875 mg total) by mouth 2 (two) times daily. Patient not taking:  Reported on 01/06/2017 11/19/16   Chrismon, Vickki Muff, PA  aspirin EC 81 MG tablet Take 81 mg by mouth daily.    [provider]  aspirin-acetaminophen-caffeine (EXCEDRIN MIGRAINE) [phone] MG tablet Take 1 tablet by mouth every 6 (six) hours as needed for migraine.    [provider]  baclofen (LIORESAL) 10 MG tablet TAKE 1 TABLET (10 MG TOTAL) BY MOUTH 2 (TWO) TIMES DAILY 03/25/16   [provider]  gabapentin (NEURONTIN) 300 MG capsule TAKE 1 CAPSULE (300 MG TOTAL) BY MOUTH NIGHTLY. 07/09/16   [provider]  HYDROcodone-acetaminophen (NORCO/VICODIN) 5-325 MG tablet Take 1 tablet by mouth every 6 (six) hours as needed. For pain Patient taking differently: Take 1 tablet by mouth every 6 (six) hours as needed. For migraines 04/09/16   Chrismon, Vickki Muff, PA  loratadine (CLARITIN) 10 MG tablet Take 10 mg by mouth 2 (two) times daily.    [provider]  metoprolol tartrate (LOPRESSOR) 25 MG tablet Take 25 mg by mouth 2 (two) times daily.  01/02/16 01/01/17  [provider]  pantoprazole (PROTONIX) 40 MG tablet TAKE ONE TABLET BY MOUTH DAILY 10/25/16   Chrismon, Vickki Muff, PA  predniSONE (DELTASONE) 5 MG tablet Taper dosage by mouth at meal times as directed (3,3,2,9,5,1) Patient not taking: Reported on 01/06/2017 11/19/16   Chrismon, Vickki Muff, PA  Probiotic Product (PROBIOTIC-10) CHEW Chew 2 tablets by mouth daily.    [provider]  promethazine (PHENERGAN) 25 MG tablet Take 1 tablet (25 mg total) by mouth every 8 (eight) hours as needed for nausea or vomiting. Patient not taking: Reported on 01/06/2017 11/19/16   Chrismon, Vickki Muff, PA  QUEtiapine (SEROQUEL) 50 MG tablet Take 1 tablet (50 mg total) by mouth at bedtime. Patient taking differently: Take 75 mg by mouth at bedtime. Takes 1.5 tablets 04/19/16   Chrismon, Vickki Muff, PA  simvastatin (ZOCOR) 40 MG tablet TAKE 1 TABLET BY MOUTH NIGHTLY AT BEDTIME 08/02/16   Chrismon, Vickki Muff, PA    Specialty Vitamins Products (BIOTIN PLUS KERATIN) 10000-100 MCG-MG TABS Take 1 tablet by mouth daily.    [provider]     Allergies Diazepam; Ciprofloxacin; Codeine; Morphine; Niacin and related; Toradol [ketorolac tromethamine]; and Iodinated diagnostic agents  Family History  Problem Relation Age of Onset  . Breast cancer Mother   . Skin cancer Mother   . Hypothyroidism Mother   . Migraines Sister   . Colon cancer Father   . Lung cancer Father   . Heart disease Father   . Diabetes Paternal Grandmother   . Hypertension Paternal Grandmother   . Stroke Paternal Grandmother   . Dementia Paternal Grandfather   . Breast cancer Maternal Aunt   . Diabetes Paternal Uncle   . Breast cancer Cousin     Social History Social History   Tobacco Use  . Smoking status: Current Every Day Smoker    Packs/day: 0.50    Years: 30.00    Pack years: 15.00    Types: Cigarettes  .  Smokeless tobacco: Never Used  Substance Use Topics  . Alcohol use: Yes    Alcohol/week: 0.6 oz    Types: 1 Cans of beer per week    Comment: occasional  . Drug use: No    Review of Systems  Constitutional: No fever/chills Eyes: No visual changes.  ENT: No sore throat. Cardiovascular: Denies chest pain.  No palpitation Respiratory: Denies cough Gastrointestinal: No abdominal pain.  No nausea, no vomiting.   Genitourinary: Negative for dysuria. Musculoskeletal: Negative for back pain. Skin: Negative for rash. Neurological:  mild headache, left facial droop   ____________________________________________   PHYSICAL EXAM:  VITAL SIGNS: ED Triage Vitals  Enc Vitals Group     BP 01/20/17 1141 120/86     Pulse Rate 01/20/17 1141 (!) 59     Resp 01/20/17 1141 16     Temp 01/20/17 1141 97.7 F (36.5 C)     Temp Source 01/20/17 1141 Oral     SpO2 01/20/17 1141 100 %     Weight 01/20/17 1142 49.9 kg (110 lb)     Height 01/20/17 1142 1.524 m (5')     Head Circumference --      Peak Flow  --      Pain Score --      Pain Loc --      Pain Edu? --      Excl. in [place]? --     Constitutional: Alert and oriented. No acute distress. Pleasant and interactive Eyes: Conjunctivae are normal.  Head: Atraumatic. Nose: No congestion/rhinnorhea. Mouth/Throat: Mucous membranes are moist.   Neck:  Painless ROM Cardiovascular: Normal rate, regular rhythm. Grossly normal heart sounds.  Good peripheral circulation. Respiratory: Normal respiratory effort.  No retractions. Lungs CTAB. Gastrointestinal: Soft and nontender. No distention.  No CVA tenderness. Genitourinary: deferred Musculoskeletal: No lower extremity tenderness nor edema.  Warm and well perfused Neurologic:  Normal speech and language.  Left facial droop, no ptosis or forehead involvement.  Normal strength in all extremities.  Cranial nerves otherwise normal Skin:  Skin is warm, dry and intact. No rash noted. Psychiatric: Mood and affect are normal. Speech and behavior are normal.  ____________________________________________   LABS (all labs ordered are listed, but only abnormal results are displayed)  Labs Reviewed  BASIC METABOLIC PANEL - Abnormal; Notable for the following components:      Result Value   Potassium 3.4 (*)    Calcium 8.8 (*)    All other components within normal limits  URINALYSIS, COMPLETE (UACMP) WITH MICROSCOPIC - Abnormal; Notable for the following components:   Color, Urine STRAW (*)    APPearance CLEAR (*)    Hgb urine dipstick SMALL (*)    Squamous Epithelial / LPF 0-5 (*)    All other components within normal limits  CBC  TROPONIN I   ____________________________________________  EKG  ED ECG REPORT I, Lavonia Drafts, the attending physician, personally viewed and interpreted this ECG.  Date: 01/20/2017  Rhythm: Sinus bradycardia QRS Axis: normal Intervals: normal ST/T Wave abnormalities: normal Narrative Interpretation: no evidence of acute  ischemia  ____________________________________________  RADIOLOGY  CT head unremarkable MRI brain unremarkable ____________________________________________   PROCEDURES  Procedure(s) performed: No  Procedures   Critical Care performed: No ____________________________________________   INITIAL IMPRESSION / ASSESSMENT AND PLAN / ED COURSE  Pertinent labs & imaging results that were available during my care of the patient were reviewed by me and considered in my medical decision making (see chart for details).  Patient presents  with neuro deficits but without migraine.  History of complex migraines in the past as detailed above.  Vital signs are reassuring.  Lab work overall unremarkable.  CT head ordered given concern for possible CVA.  Patient apparently woke up with neuro deficits.  CT normal, discussed with Dr. Doy Mince of neurology who recommends MRI as she suspects this is related to complex migraine  ----------------------------------------- 4:41 PM on 01/20/2017 -----------------------------------------  MRI negative, patient has now developed a full on migraine per her description.  She requests IM medications Toradol Benadryl and Phenergan which have always helped her in the past, refuses IV    ____________________________________________   FINAL CLINICAL IMPRESSION(S) / ED DIAGNOSES  Final diagnoses:  Complicated migraine        Note:  This document was prepared using Dragon voice recognition software and may include unintentional dictation errors.    Lavonia Drafts, MD 01/20/17 [phone]

## 2017-01-21 ENCOUNTER — Other Ambulatory Visit: Payer: Self-pay | Admitting: Family Medicine

## 2017-01-28 ENCOUNTER — Other Ambulatory Visit: Payer: Self-pay | Admitting: Family Medicine

## 2017-01-28 DIAGNOSIS — K2270 Barrett's esophagus without dysplasia: Secondary | ICD-10-CM

## 2017-03-21 ENCOUNTER — Ambulatory Visit
Admission: RE | Admit: 2017-03-21 | Discharge: 2017-03-21 | Disposition: A | Discharge status: Home / Self Care | Payer: BC Managed Care – PPO | Source: Ambulatory Visit | Attending: Family Medicine | Admitting: Family Medicine

## 2017-03-21 ENCOUNTER — Encounter: Payer: Self-pay | Admitting: Family Medicine

## 2017-03-21 ENCOUNTER — Ambulatory Visit (INDEPENDENT_AMBULATORY_CARE_PROVIDER_SITE_OTHER): Payer: BC Managed Care – PPO | Admitting: Family Medicine

## 2017-03-21 VITALS — BP 102/64 | HR 54 | Temp 98.4°F | Wt 114.4 lb

## 2017-03-21 DIAGNOSIS — R0609 Other forms of dyspnea: Secondary | ICD-10-CM | POA: Diagnosis not present

## 2017-03-21 DIAGNOSIS — R0600 Dyspnea, unspecified: Secondary | ICD-10-CM | POA: Diagnosis present

## 2017-03-21 DIAGNOSIS — G43409 Hemiplegic migraine, not intractable, without status migrainosus: Secondary | ICD-10-CM | POA: Diagnosis not present

## 2017-03-21 DIAGNOSIS — R0602 Shortness of breath: Secondary | ICD-10-CM | POA: Insufficient documentation

## 2017-03-21 MED ORDER — BUDESONIDE-FORMOTEROL FUMARATE 160-4.5 MCG/ACT IN AERO: 1.0000 | INHALATION_SPRAY | Freq: Two times a day (BID) | RESPIRATORY_TRACT | 0 refills | Status: AC

## 2017-03-21 NOTE — Progress Notes (Signed)
[name] Female    DOB: [birth year]   62 y.o.   MRN: [id number] Visit Date: 03/21/2017  Today's Provider: Vernie Murders, PA   Chief Complaint  Patient presents with  . Headache  . Shortness of Breath   Subjective:    Migraine   This is a recurrent problem. Episode onset: last week. The problem occurs intermittently. The problem has been resolved. The pain is located in the frontal region. The pain quality is similar to prior headaches. The quality of the pain is described as stabbing. Associated symptoms include nausea, photophobia and a visual change. The symptoms are aggravated by bright light (movement). Treatments tried: norco and phenergen. Her past medical history is significant for migraine headaches.  Shortness of Breath  This is a new problem. Episode onset: 1 month ago. The problem occurs constantly. The problem has been gradually worsening. Associated symptoms include headaches. 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 Risk factors include smoking. Treatments tried: Albuterol.     Past Medical History:  Diagnosis Date  . Adnexal mass   . Anxiety   . Barrett's esophagus   . Bell palsy    left foot also ,   . Bronchitis   . Complication of anesthesia    difficulty waking up  . Degenerative disc disease, cervical   . Depression   . Environmental and seasonal allergies   . GERD (gastroesophageal reflux disease)   . High triglycerides   . Migraines   . Migraines   . Rapid heart rate    Past Surgical History:  Procedure Laterality Date  . ABDOMINAL HYSTERECTOMY    . APPENDECTOMY    . CHOLECYSTECTOMY    . COLONOSCOPY WITH PROPOFOL N/A 07/16/2016   Procedure: COLONOSCOPY WITH PROPOFOL;  Surgeon: [name], MD;  Location: [facility] ENDOSCOPY;  Service: Endoscopy;  Laterality: N/A;  . ESOPHAGOGASTRODUODENOSCOPY (EGD) WITH PROPOFOL N/A 07/16/2016   Procedure: ESOPHAGOGASTRODUODENOSCOPY (EGD) WITH PROPOFOL;  Surgeon: [name], MD;  Location[facility] ENDOSCOPY;  Service: Endoscopy;  Laterality: N/A;  . [place]  . LAPAROSCOPIC SALPINGO OOPHERECTOMY Bilateral 08/27/2016   Procedure: LAPAROSCOPIC SALPINGO OOPHORECTOMY;  Surgeon: Gae Dry, MD;  Location: [facility] ORS;  Service: Gynecology;  Laterality: Bilateral;  . TUBAL LIGATION  1983   Family History  Problem Relation Age of Onset  . Breast cancer Mother   . Skin cancer Mother   . Hypothyroidism Mother   . Migraines Sister   . Colon cancer Father   . Lung cancer Father   . Heart disease Father   . Diabetes Paternal Grandmother   . Hypertension Paternal Grandmother   . Stroke Paternal Grandmother   . Dementia Paternal Grandfather   . Breast cancer Maternal Aunt   . Diabetes Paternal Uncle   . Breast cancer Cousin    Allergies  Allergen Reactions  . Diazepam Other (See Comments)    Other reaction(s): Hallucination Patient states "it makes me crazy" REACTION: Behavioral problems  . Ciprofloxacin Other (See Comments)    Other reaction(s): Unknown Loopy  . Codeine Hives and Itching    REACTION: Dizziness  . Morphine Hives    Other reaction(s): Unknown  . Niacin And Related Other (See Comments)    flushing  . Toradol [Ketorolac Tromethamine] Itching    Tolerates when taken with Benadryl  . Iodinated Diagnostic Agents Hives    Pt states she had a CT Head with contrast in the 90's and broke out in  hives after the injection. SPM    Current Outpatient Medications:  .  albuterol (PROVENTIL HFA;VENTOLIN HFA) 108 (90 Base) MCG/ACT inhaler, Inhale 2 puffs into the lungs every 6 (six) hours as needed for wheezing or shortness of breath., Disp: 1 Inhaler, Rfl: 2 .  ALPRAZolam (XANAX) 0.5 MG tablet, Take 1 tablet (0.5 mg total) by mouth 3 (three) times daily as needed for anxiety., Disp: 90 tablet, Rfl: 0 .  aspirin EC 81 MG tablet, Take 81 mg by mouth daily., Disp: , Rfl:  .  aspirin-acetaminophen-caffeine (EXCEDRIN MIGRAINE)  250-250-65 MG tablet, Take 1 tablet by mouth every 6 (six) hours as needed for migraine., Disp: , Rfl:  .  baclofen (LIORESAL) 10 MG tablet, TAKE 1 TABLET (10 MG TOTAL) BY MOUTH 2 (TWO) TIMES DAILY, Disp: , Rfl: 2 .  gabapentin (NEURONTIN) 400 MG capsule, TAKE 1 CAPSULE (400 MG TOTAL) BY MOUTH NIGHTLY., Disp: , Rfl: 3 .  HYDROcodone-acetaminophen (NORCO/VICODIN) 5-325 MG tablet, Take 1 tablet by mouth every 6 (six) hours as needed. For pain (Patient taking differently: Take 1 tablet by mouth every 6 (six) hours as needed. For migraines), Disp: 30 tablet, Rfl: 0 .  loratadine (CLARITIN) 10 MG tablet, Take 10 mg by mouth 2 (two) times daily., Disp: , Rfl:  .  pantoprazole (PROTONIX) 40 MG tablet, TAKE 1 TABLET BY MOUTH ONCE A DAY, Disp: 90 tablet, Rfl: 1 .  Probiotic Product (PROBIOTIC-10) CHEW, Chew 2 tablets by mouth daily., Disp: , Rfl:  .  promethazine (PHENERGAN) 25 MG tablet, Take 1 tablet (25 mg total) by mouth every 8 (eight) hours as needed for nausea or vomiting., Disp: 30 tablet, Rfl: 0 .  QUEtiapine (SEROQUEL) 50 MG tablet, Take 1 tablet (50 mg total) by mouth at bedtime. (Patient taking differently: Take 75 mg by mouth at bedtime. Takes 1.5 tablets), Disp: 90 tablet, Rfl: 3 .  simvastatin (ZOCOR) 40 MG tablet, TAKE ONE TABLET BY MOUTH NIGHTLY AT BEDTIME, Disp: 30 tablet, Rfl: 5 .  Specialty Vitamins Products (BIOTIN PLUS KERATIN) 10000-100 MCG-MG TABS, Take 1 tablet by mouth daily., Disp: , Rfl:  .  metoprolol tartrate (LOPRESSOR) 25 MG tablet, Take 25 mg by mouth 2 (two) times daily. , Disp: , Rfl:   Review of Systems  Constitutional: Negative.   Eyes: Positive for photophobia.  Respiratory: Positive for shortness of breath.   Cardiovascular: Negative.   Gastrointestinal: Positive for nausea.  Neurological: Positive for headaches.   Social History   Tobacco Use  . Smoking status: Current Every Day Smoker    Packs/day: 0.50    Years: 30.00    Pack years: 15.00    Types:  Cigarettes  . Smokeless tobacco: Never Used  Substance Use Topics  . Alcohol use: Yes    Alcohol/week: 0.6 oz    Types: 1 Cans of beer per week    Comment: occasional   Objective:   BP 102/64 (BP Location: Right Arm, Patient Position: Sitting, Cuff Size: Normal)   Pulse (!) 54   Temp 98.4 F (36.9 C) (Oral)   Wt 114 lb 6.4 oz (51.9 kg)   SpO2 99%   BMI 22.34 kg/m   Physical Exam  Constitutional: She is oriented to person, place, and time. She appears well-developed and well-nourished. No distress.  HENT:  Head: Normocephalic and atraumatic.  Right Ear: Hearing and external ear normal.  Left Ear: Hearing and external ear normal.  Nose: Nose normal.  Mouth/Throat: Oropharynx is clear and moist.  Eyes: Conjunctivae  and lids are normal. Right eye exhibits no discharge. Left eye exhibits no discharge. No scleral icterus.  Neck: Neck supple.  Cardiovascular: Normal rate and regular rhythm.  Pulmonary/Chest: Breath sounds normal. She is in respiratory distress.  Intermittent sense of dyspnea. No significant cough or congestion.  Abdominal: Bowel sounds are normal.  Musculoskeletal: Normal range of motion.  Neurological: She is alert and oriented to person, place, and time.  Skin: Skin is intact. No lesion and no rash noted.  Psychiatric: She has a normal mood and affect. Her speech is normal and behavior is normal. Thought content normal.      Assessment & Plan:     1. Hemiplegic migraine without status migrainosus, not intractable Has had more migraines since December 2018. States the strain of working to read fine print has triggered more frequent headaches. Had an appointment to see her neurologist in the next month or two. States she remembers having a tick on the back of her [place] 12-08-16 that her husband removed. No fever or flu-like symptoms since that time. Will check CBC and CMP and schedule follow up appointment pending reports. No headache today, yet. - Comprehensive  metabolic panel - CBC with Differential/Platelet  2. Dyspnea on exertion States she has dyspnea intermittently on exertion or after taking a great deal. No audible wheezes or cough. Still smoking 1 pack every 3 days. Albuterol some help but dyspnea continues to return. Has an appointment with Dr. Saralyn Pilar today to evaluate this and her palpitations. Spirometry showed moderate airway obstruction with low vital capacity and estimated lung age of 20 years (age 28). Given sample of Symbicort 160/4.5 mcg 1 inhalation BID and continue Albuterol prn rescue. Should stop all smoking and proceed with cardiology follow up. Check CBC, CMP and CXR. - Spirometry with graph - DG Chest 2 View - Comprehensive metabolic panel - CBC with Differential/Platelet        Vernie Murders, PA  [facility]

## 2017-03-22 ENCOUNTER — Ambulatory Visit: Payer: BC Managed Care – PPO | Admitting: Family Medicine

## 2017-03-22 ENCOUNTER — Encounter: Payer: Self-pay | Admitting: Family Medicine

## 2017-03-22 VITALS — BP 102/66 | HR 55 | Temp 98.4°F | Wt 111.8 lb

## 2017-03-22 DIAGNOSIS — R5381 Other malaise: Secondary | ICD-10-CM | POA: Diagnosis not present

## 2017-03-22 DIAGNOSIS — R059 Cough, unspecified: Secondary | ICD-10-CM

## 2017-03-22 DIAGNOSIS — Z20828 Contact with and (suspected) exposure to other viral communicable diseases: Secondary | ICD-10-CM

## 2017-03-22 DIAGNOSIS — J029 Acute pharyngitis, unspecified: Secondary | ICD-10-CM | POA: Diagnosis not present

## 2017-03-22 DIAGNOSIS — R05 Cough: Secondary | ICD-10-CM | POA: Diagnosis not present

## 2017-03-22 LAB — COMPREHENSIVE METABOLIC PANEL
A/G RATIO: 2 (ref 1.2–2.2)
ALBUMIN: 4.3 g/dL (ref 3.6–4.8)
ALT: 15 IU/L (ref 0–32)
AST: 19 IU/L (ref 0–40)
Alkaline Phosphatase: 71 IU/L (ref 39–117)
BUN/Creatinine Ratio: 15 (ref 12–28)
BUN: 14 mg/dL (ref 8–27)
Bilirubin Total: 0.4 mg/dL (ref 0.0–1.2)
CALCIUM: 9.6 mg/dL (ref 8.7–10.3)
CO2: 24 mmol/L (ref 20–29)
Chloride: 105 mmol/L (ref 96–106)
Creatinine, Ser: 0.96 mg/dL (ref 0.57–1.00)
GFR, EST AFRICAN AMERICAN: 74 mL/min/{1.73_m2} (ref >59)
GFR, EST NON AFRICAN AMERICAN: 64 mL/min/{1.73_m2} (ref >59)
Globulin, Total: 2.2 g/dL (ref 1.5–4.5)
Glucose: 91 mg/dL (ref 65–99)
Potassium: 4.4 mmol/L (ref 3.5–5.2)
Sodium: 144 mmol/L (ref 134–144)
TOTAL PROTEIN: 6.5 g/dL (ref 6.0–8.5)

## 2017-03-22 LAB — CBC WITH DIFFERENTIAL/PLATELET
BASOS: 1 %
Basophils Absolute: 0 10*3/uL (ref 0.0–0.2)
EOS (ABSOLUTE): 0.4 10*3/uL (ref 0.0–0.4)
Eos: 7 %
HEMOGLOBIN: 13.6 g/dL (ref 11.1–15.9)
Hematocrit: 39 % (ref 34.0–46.6)
IMMATURE GRANS (ABS): 0 10*3/uL (ref 0.0–0.1)
Immature Granulocytes: 0 %
LYMPHS: 20 %
Lymphocytes Absolute: 1.3 10*3/uL (ref 0.7–3.1)
MCH: 32.7 pg (ref 26.6–33.0)
MCHC: 34.9 g/dL (ref 31.5–35.7)
MCV: 94 fL (ref 79–97)
MONOCYTES: 6 %
Monocytes Absolute: 0.4 10*3/uL (ref 0.1–0.9)
NEUTROS ABS: 4.3 10*3/uL (ref 1.4–7.0)
Neutrophils: 66 %
Platelets: 205 10*3/uL (ref 150–379)
RBC: 4.16 x10E6/uL (ref 3.77–5.28)
RDW: 13.6 % (ref 12.3–15.4)
WBC: 6.5 10*3/uL (ref 3.4–10.8)

## 2017-03-22 LAB — POCT RAPID STREP A (OFFICE): Rapid Strep A Screen: NEGATIVE

## 2017-03-22 MED ORDER — HYDROCODONE-HOMATROPINE 5-1.5 MG/5ML PO SYRP: 5.0000 mL | ORAL_SOLUTION | Freq: Three times a day (TID) | ORAL | 0 refills | Status: DC | PRN

## 2017-03-22 MED ORDER — OSELTAMIVIR PHOSPHATE 75 MG PO CAPS: 75.0000 mg | ORAL_CAPSULE | Freq: Two times a day (BID) | ORAL | 0 refills | Status: DC

## 2017-03-22 NOTE — Progress Notes (Signed)
[name] Female    DOB: 04-30-1955   62 y.o.   MRN: [id number] Visit Date: 03/22/2017  Today's Provider: Vernie Murders, PA   Chief Complaint  Patient presents with  . URI   Subjective:    URI   This is a new problem. The current episode started yesterday. The problem has been gradually worsening. Maximum temperature: 99.9. Associated symptoms include congestion, coughing and rhinorrhea. Associated symptoms comments: Fever . She has tried nothing for the symptoms.   Past Medical History:  Diagnosis Date  . Adnexal mass   . Anxiety   . Barrett's esophagus   . Bell palsy    left foot also ,   . Bronchitis   . Complication of anesthesia    difficulty waking up  . Degenerative disc disease, cervical   . Depression   . Environmental and seasonal allergies   . GERD (gastroesophageal reflux disease)   . High triglycerides   . Migraines   . Migraines   . Rapid heart rate    Past Surgical History:  Procedure Laterality Date  . ABDOMINAL HYSTERECTOMY    . APPENDECTOMY    . CHOLECYSTECTOMY    . COLONOSCOPY WITH PROPOFOL N/A 07/16/2016   Procedure: COLONOSCOPY WITH PROPOFOL;  Surgeon: [name], MD;  Location: [facility] ENDOSCOPY;  Service: Endoscopy;  Laterality: N/A;  . ESOPHAGOGASTRODUODENOSCOPY (EGD) WITH PROPOFOL N/A 07/16/2016   Procedure: ESOPHAGOGASTRODUODENOSCOPY (EGD) WITH PROPOFOL;  Surgeon: [name], MD;  Location: [facility] ENDOSCOPY;  Service: Endoscopy;  Laterality: N/A;  . [place]  . LAPAROSCOPIC SALPINGO OOPHERECTOMY Bilateral 08/27/2016   Procedure: LAPAROSCOPIC SALPINGO OOPHORECTOMY;  Surgeon: Gae Dry, MD;  Location: [facility] ORS;  Service: Gynecology;  Laterality: Bilateral;  . TUBAL LIGATION  1983   Family History  Problem Relation Age of Onset  . Breast cancer Mother   . Skin cancer Mother   . Hypothyroidism Mother   . Migraines Sister   . Colon cancer Father   . Lung cancer Father   . Heart disease Father     . Diabetes Paternal Grandmother   . Hypertension Paternal Grandmother   . Stroke Paternal Grandmother   . Dementia Paternal Grandfather   . Breast cancer Maternal Aunt   . Diabetes Paternal Uncle   . Breast cancer Cousin    Allergies  Allergen Reactions  . Diazepam Other (See Comments)    Other reaction(s): Hallucination Patient states "it makes me crazy" REACTION: Behavioral problems  . Ciprofloxacin Other (See Comments)    Other reaction(s): Unknown Loopy  . Codeine Hives and Itching    REACTION: Dizziness  . Morphine Hives    Other reaction(s): Unknown  . Niacin And Related Other (See Comments)    flushing  . Toradol [Ketorolac Tromethamine] Itching    Tolerates when taken with Benadryl  . Iodinated Diagnostic Agents Hives    Pt states she had a CT Head with contrast in the 90's and broke out in hives after the injection. SPM    Current Outpatient Medications:  .  albuterol (PROVENTIL HFA;VENTOLIN HFA) 108 (90 Base) MCG/ACT inhaler, Inhale 2 puffs into the lungs every 6 (six) hours as needed for wheezing or shortness of breath., Disp: 1 Inhaler, Rfl: 2 .  ALPRAZolam (XANAX) 0.5 MG tablet, Take 1 tablet (0.5 mg total) by mouth 3 (three) times daily as needed for anxiety., Disp: 90 tablet, Rfl: 0 .  aspirin EC 81 MG tablet, Take 81 mg by mouth  daily., Disp: , Rfl:  .  aspirin-acetaminophen-caffeine (EXCEDRIN MIGRAINE) 250-250-65 MG tablet, Take 1 tablet by mouth every 6 (six) hours as needed for migraine., Disp: , Rfl:  .  baclofen (LIORESAL) 10 MG tablet, TAKE 1 TABLET (10 MG TOTAL) BY MOUTH 2 (TWO) TIMES DAILY, Disp: , Rfl: 2 .  budesonide-formoterol (SYMBICORT) 160-4.5 MCG/ACT inhaler, Inhale 1 puff into the lungs 2 (two) times daily., Disp: 1 Inhaler, Rfl: 0 .  gabapentin (NEURONTIN) 400 MG capsule, TAKE 1 CAPSULE (400 MG TOTAL) BY MOUTH NIGHTLY., Disp: , Rfl: 3 .  HYDROcodone-acetaminophen (NORCO/VICODIN) 5-325 MG tablet, Take 1 tablet by mouth every 6 (six) hours as  needed. For pain (Patient taking differently: Take 1 tablet by mouth every 6 (six) hours as needed. For migraines), Disp: 30 tablet, Rfl: 0 .  loratadine (CLARITIN) 10 MG tablet, Take 10 mg by mouth 2 (two) times daily., Disp: , Rfl:  .  pantoprazole (PROTONIX) 40 MG tablet, TAKE 1 TABLET BY MOUTH ONCE A DAY, Disp: 90 tablet, Rfl: 1 .  Probiotic Product (PROBIOTIC-10) CHEW, Chew 2 tablets by mouth daily., Disp: , Rfl:  .  promethazine (PHENERGAN) 25 MG tablet, Take 1 tablet (25 mg total) by mouth every 8 (eight) hours as needed for nausea or vomiting., Disp: 30 tablet, Rfl: 0 .  QUEtiapine (SEROQUEL) 50 MG tablet, Take 1 tablet (50 mg total) by mouth at bedtime. (Patient taking differently: Take 75 mg by mouth at bedtime. Takes 1.5 tablets), Disp: 90 tablet, Rfl: 3 .  simvastatin (ZOCOR) 40 MG tablet, TAKE ONE TABLET BY MOUTH NIGHTLY AT BEDTIME, Disp: 30 tablet, Rfl: 5 .  Specialty Vitamins Products (BIOTIN PLUS KERATIN) 10000-100 MCG-MG TABS, Take 1 tablet by mouth daily., Disp: , Rfl:  .  metoprolol tartrate (LOPRESSOR) 25 MG tablet, Take 25 mg by mouth 2 (two) times daily. , Disp: , Rfl:   Review of Systems  Constitutional: Negative.   HENT: Positive for congestion and rhinorrhea.   Respiratory: Positive for cough.   Cardiovascular: Negative.     Social History   Tobacco Use  . Smoking status: Current Every Day Smoker    Packs/day: 0.50    Years: 30.00    Pack years: 15.00    Types: Cigarettes  . Smokeless tobacco: Never Used  Substance Use Topics  . Alcohol use: Yes    Alcohol/week: 0.6 oz    Types: 1 Cans of beer per week    Comment: occasional   Objective:   BP 102/66 (BP Location: Right Arm, Patient Position: Sitting, Cuff Size: Normal)   Pulse (!) 55   Temp 98.4 F (36.9 C) (Oral)   Wt 111 lb 12.8 oz (50.7 kg)   SpO2 98%   BMI 21.83 kg/m    Physical Exam  Constitutional: She is oriented to person, place, and time. She appears well-developed and well-nourished. No  distress.  HENT:  Head: Normocephalic and atraumatic.  Right Ear: Hearing and external ear normal.  Left Ear: Hearing and external ear normal.  Nose: Nose normal.  Red injected posterior pharynx without exudates.   Eyes: Conjunctivae and lids are normal. Right eye exhibits no discharge. Left eye exhibits no discharge. No scleral icterus.  Neck: Neck supple.  Cardiovascular: Normal rate and regular rhythm.  Pulmonary/Chest: Effort normal. No respiratory distress.  Abdominal: Soft.  Musculoskeletal: Normal range of motion.  Lymphadenopathy:    She has no cervical adenopathy.  Neurological: She is alert and oriented to person, place, and time.  Skin: Skin is  intact. No lesion and no rash noted.  Psychiatric: She has a normal mood and affect. Her speech is normal and behavior is normal. Thought content normal.      Assessment & Plan:      1. Sore throat Onset yesterday with cough and malaise with chills. Son diagnosed with influenza A today. Strep test negative. Suspect influenza. Will treat with Tamiflu and Hycodan for cough. Recheck if no better in 5-7 days. - POCT rapid strep A  2. Cough Onset yesterday. No sputum production with some rhinorrhea. May use Hycodan for cough. Recheck prn.  3. Malaise Onset the past 24 hours with low grade fever (99.9) this morning. Took Tylenol and went back to bed. Increase fluid intake. Home to rest. Note written for out of work today.  4. Exposure to influenza Around several co-workers and son have had flu recently. Son had a positive influenza A test today. Will treat with Tamiflu.       Vernie Murders, PA  [facility]

## 2017-03-22 NOTE — Patient Instructions (Signed)

## 2017-06-08 ENCOUNTER — Telehealth: Payer: Self-pay | Admitting: Emergency Medicine

## 2017-06-08 NOTE — Telephone Encounter (Signed)
Pt calling about paper work that was faxed over about a week ago. She is wanting to know the status of this. Pt reports that she takes a break at 3:00. You can leave a VM. Per pt this paper work is time sensitive. Please advise.

## 2017-06-09 NOTE — Telephone Encounter (Signed)
Have NOT seen any forms for her.

## 2017-06-09 NOTE — Telephone Encounter (Signed)
Left patient a VM advising her that we have not received any forms to be completed. Also left the fax number so the forms can be re faxed.

## 2017-06-16 ENCOUNTER — Ambulatory Visit: Payer: BC Managed Care – PPO | Admitting: Family Medicine

## 2017-06-16 ENCOUNTER — Encounter: Payer: Self-pay | Admitting: Family Medicine

## 2017-06-16 VITALS — BP 102/58 | HR 51 | Temp 98.4°F | Wt 113.2 lb

## 2017-06-16 DIAGNOSIS — G43411 Hemiplegic migraine, intractable, with status migrainosus: Secondary | ICD-10-CM | POA: Diagnosis not present

## 2017-06-16 MED ORDER — PROMETHAZINE HCL 25 MG/ML IJ SOLN
25.0000 mg | Freq: Once | INTRAMUSCULAR | Status: AC
  Administered 2017-06-16: 25 mg via INTRAMUSCULAR

## 2017-06-16 MED ORDER — HYDROCODONE-ACETAMINOPHEN 5-325 MG PO TABS: 1.0000 | ORAL_TABLET | Freq: Four times a day (QID) | ORAL | 0 refills | Status: DC | PRN

## 2017-06-16 MED ORDER — KETOROLAC TROMETHAMINE 30 MG/ML IJ SOLN
30.0000 mg | Freq: Once | INTRAMUSCULAR | Status: AC
  Administered 2017-06-16: 30 mg via INTRAMUSCULAR

## 2017-06-16 MED ORDER — DIPHENHYDRAMINE HCL 50 MG PO CAPS: 50.0000 mg | ORAL_CAPSULE | Freq: Once | ORAL | Status: DC

## 2017-06-16 MED ORDER — PROMETHAZINE HCL 25 MG PO TABS: 25.0000 mg | ORAL_TABLET | Freq: Once | ORAL | Status: DC

## 2017-06-16 MED ORDER — DIPHENHYDRAMINE HCL 50 MG/ML IJ SOLN
50.0000 mg | Freq: Once | INTRAMUSCULAR | Status: AC
  Administered 2017-06-16: 50 mg via INTRAMUSCULAR

## 2017-06-16 NOTE — Progress Notes (Signed)
[name] Female    DOB: 30-Jul-1955   62 y.o.   MRN: [id number] Visit Date: 06/16/2017  Today's Provider: Vernie Murders, PA   Chief Complaint  Patient presents with  . Migraine   Subjective:    Migraine   This is a recurrent problem. The current episode started today. The problem occurs constantly. The problem has been gradually worsening. The pain is located in the frontal and temporal region. The pain quality is similar to prior headaches. Quality: pounding. Associated symptoms include nausea. The symptoms are aggravated by bright light and noise. Treatments tried: BC and Vicodin. The treatment provided no relief. Her past medical history is significant for migraine headaches.   Past Medical History:  Diagnosis Date  . Adnexal mass   . Anxiety   . Barrett's esophagus   . Bell palsy    left foot also ,   . Bronchitis   . Complication of anesthesia    difficulty waking up  . Degenerative disc disease, cervical   . Depression   . Environmental and seasonal allergies   . GERD (gastroesophageal reflux disease)   . High triglycerides   . Migraines   . Migraines   . Rapid heart rate    Past Surgical History:  Procedure Laterality Date  . ABDOMINAL HYSTERECTOMY    . APPENDECTOMY    . CHOLECYSTECTOMY    . COLONOSCOPY WITH PROPOFOL N/A 07/16/2016   Procedure: COLONOSCOPY WITH PROPOFOL;  Surgeon: [name], MD;  Location: [facility] ENDOSCOPY;  Service: Endoscopy;  Laterality: N/A;  . ESOPHAGOGASTRODUODENOSCOPY (EGD) WITH PROPOFOL N/A 07/16/2016   Procedure: ESOPHAGOGASTRODUODENOSCOPY (EGD) WITH PROPOFOL;  Surgeon: [name], MD;  Location[facility] ENDOSCOPY;  Service: Endoscopy;  Laterality: N/A;  . [place]  . LAPAROSCOPIC SALPINGO OOPHERECTOMY Bilateral 08/27/2016   Procedure: LAPAROSCOPIC SALPINGO OOPHORECTOMY;  Surgeon: Gae Dry, MD;  Location: [facility] ORS;  Service: Gynecology;  Laterality: Bilateral;  . TUBAL LIGATION  1983   Family  History  Problem Relation Age of Onset  . Breast cancer Mother   . Skin cancer Mother   . Hypothyroidism Mother   . Migraines Sister   . Colon cancer Father   . Lung cancer Father   . Heart disease Father   . Diabetes Paternal Grandmother   . Hypertension Paternal Grandmother   . Stroke Paternal Grandmother   . Dementia Paternal Grandfather   . Breast cancer Maternal Aunt   . Diabetes Paternal Uncle   . Breast cancer Cousin    Allergies  Allergen Reactions  . Diazepam Other (See Comments)    Other reaction(s): Hallucination Patient states "it makes me crazy" REACTION: Behavioral problems  . Ciprofloxacin Other (See Comments)    Other reaction(s): Unknown Loopy  . Codeine Hives and Itching    REACTION: Dizziness  . Morphine Hives    Other reaction(s): Unknown  . Niacin And Related Other (See Comments)    flushing  . Toradol [Ketorolac Tromethamine] Itching    Tolerates when taken with Benadryl  . Iodinated Diagnostic Agents Hives    Pt states she had a CT Head with contrast in the 90's and broke out in hives after the injection. SPM    Current Outpatient Medications:  .  albuterol (PROVENTIL HFA;VENTOLIN HFA) 108 (90 Base) MCG/ACT inhaler, Inhale 2 puffs into the lungs every 6 (six) hours as needed for wheezing or shortness of breath., Disp: 1 Inhaler, Rfl: 2 .  ALPRAZolam (XANAX) 0.5 MG tablet, Take 1  tablet (0.5 mg total) by mouth 3 (three) times daily as needed for anxiety., Disp: 90 tablet, Rfl: 0 .  aspirin EC 81 MG tablet, Take 81 mg by mouth daily., Disp: , Rfl:  .  aspirin-acetaminophen-caffeine (EXCEDRIN MIGRAINE) 250-250-65 MG tablet, Take 1 tablet by mouth every 6 (six) hours as needed for migraine., Disp: , Rfl:  .  baclofen (LIORESAL) 10 MG tablet, TAKE 1 TABLET (10 MG TOTAL) BY MOUTH 2 (TWO) TIMES DAILY, Disp: , Rfl: 2 .  budesonide-formoterol (SYMBICORT) 160-4.5 MCG/ACT inhaler, Inhale 1 puff into the lungs 2 (two) times daily., Disp: 1 Inhaler, Rfl: 0 .   gabapentin (NEURONTIN) 400 MG capsule, TAKE 1 CAPSULE (400 MG TOTAL) BY MOUTH NIGHTLY., Disp: , Rfl: 3 .  HYDROcodone-acetaminophen (NORCO/VICODIN) 5-325 MG tablet, Take 1 tablet by mouth every 6 (six) hours as needed. For pain (Patient taking differently: Take 1 tablet by mouth every 6 (six) hours as needed. For migraines), Disp: 30 tablet, Rfl: 0 .  HYDROcodone-homatropine (HYCODAN) 5-1.5 MG/5ML syrup, Take 5 mLs by mouth every 8 (eight) hours as needed for cough., Disp: 120 mL, Rfl: 0 .  loratadine (CLARITIN) 10 MG tablet, Take 10 mg by mouth 2 (two) times daily., Disp: , Rfl:  .  oseltamivir (TAMIFLU) 75 MG capsule, Take 1 capsule (75 mg total) by mouth 2 (two) times daily., Disp: 10 capsule, Rfl: 0 .  pantoprazole (PROTONIX) 40 MG tablet, TAKE 1 TABLET BY MOUTH ONCE A DAY, Disp: 90 tablet, Rfl: 1 .  Probiotic Product (PROBIOTIC-10) CHEW, Chew 2 tablets by mouth daily., Disp: , Rfl:  .  promethazine (PHENERGAN) 25 MG tablet, Take 1 tablet (25 mg total) by mouth every 8 (eight) hours as needed for nausea or vomiting., Disp: 30 tablet, Rfl: 0 .  QUEtiapine (SEROQUEL) 50 MG tablet, Take 1 tablet (50 mg total) by mouth at bedtime. (Patient taking differently: Take 75 mg by mouth at bedtime. Takes 1.5 tablets), Disp: 90 tablet, Rfl: 3 .  simvastatin (ZOCOR) 40 MG tablet, TAKE ONE TABLET BY MOUTH NIGHTLY AT BEDTIME, Disp: 30 tablet, Rfl: 5 .  Specialty Vitamins Products (BIOTIN PLUS KERATIN) 10000-100 MCG-MG TABS, Take 1 tablet by mouth daily., Disp: , Rfl:  .  metoprolol tartrate (LOPRESSOR) 25 MG tablet, Take 25 mg by mouth 2 (two) times daily. , Disp: , Rfl:   Review of Systems  Constitutional: Negative.   Respiratory: Negative.   Cardiovascular: Negative.   Gastrointestinal: Positive for nausea.  Neurological: Positive for headaches.   Social History   Tobacco Use  . Smoking status: Current Every Day Smoker    Packs/day: 0.50    Years: 30.00    Pack years: 15.00    Types: Cigarettes  .  Smokeless tobacco: Never Used  Substance Use Topics  . Alcohol use: Yes    Alcohol/week: 0.6 oz    Types: 1 Cans of beer per week    Comment: occasional   Objective:   BP (!) 102/58 (BP Location: Right Arm, Patient Position: Sitting, Cuff Size: Normal)   Pulse (!) 51   Temp 98.4 F (36.9 C) (Oral)   Wt 113 lb 3.2 oz (51.3 kg)   SpO2 98%   BMI 22.11 kg/m  Wt Readings from Last 3 Encounters:  06/16/17 113 lb 3.2 oz (51.3 kg)  03/22/17 111 lb 12.8 oz (50.7 kg)  03/21/17 114 lb 6.4 oz (51.9 kg)    Physical Exam  Constitutional: She is oriented to person, place, and time. She appears well-developed and  well-nourished. No distress.  HENT:  Head: Normocephalic and atraumatic.  Right Ear: Hearing normal.  Left Ear: Hearing normal.  Nose: Nose normal.  Tender left temple and frontal region with pounding headache.  Eyes: Conjunctivae and lids are normal. Right eye exhibits no discharge. Left eye exhibits no discharge. No scleral icterus.  Cardiovascular: Normal rate.  Pulmonary/Chest: Effort normal. No respiratory distress.  Abdominal: Soft. Bowel sounds are normal.  Musculoskeletal: Normal range of motion.  Neurological: She is alert and oriented to person, place, and time.  Skin: Skin is intact. No lesion and no rash noted.  Psychiatric: She has a normal mood and affect. Her speech is normal and behavior is normal. Thought content normal.       Assessment & Plan:     1. Intractable hemiplegic migraine with status migrainosus Onset at 8:00 am today but has worsened since lunch with return of left facial weakness. Same symptoms and distribution as in the past with hemiplegic migraine. Has tried Vicodin without much relief today. Headaches have been more frequent, but without the hemiplegic symptoms, since January 2019. Feels loud noises/music and bright overhead lights at work have been a trigger. Completed forms to ask job for accommodations by allowing tinted lenses on lights over  her work station with lower volume of music/noises. Given Toradol, Phenergan and Benadryl IM for migraine today. Home to rest and recheck prn. - ketorolac (TORADOL) 30 MG/ML injection 30 mg - HYDROcodone-acetaminophen (NORCO/VICODIN) 5-325 MG tablet; Take 1 tablet by mouth every 6 (six) hours as needed. For pain  Dispense: 30 tablet; Refill: 0 - promethazine (PHENERGAN) injection 25 mg - diphenhydrAMINE (BENADRYL) injection 50 mg       Vernie Murders, PA  [facility]

## 2017-07-18 ENCOUNTER — Other Ambulatory Visit: Payer: Self-pay | Admitting: Family Medicine

## 2017-07-18 DIAGNOSIS — K2270 Barrett's esophagus without dysplasia: Secondary | ICD-10-CM

## 2017-07-25 ENCOUNTER — Ambulatory Visit (INDEPENDENT_AMBULATORY_CARE_PROVIDER_SITE_OTHER): Payer: BC Managed Care – PPO | Admitting: Family Medicine

## 2017-07-25 ENCOUNTER — Encounter: Payer: Self-pay | Admitting: Family Medicine

## 2017-07-25 ENCOUNTER — Other Ambulatory Visit: Payer: Self-pay | Admitting: Family Medicine

## 2017-07-25 VITALS — BP 110/62 | HR 60 | Temp 99.2°F | Resp 16 | Wt 116.0 lb

## 2017-07-25 DIAGNOSIS — J441 Chronic obstructive pulmonary disease with (acute) exacerbation: Secondary | ICD-10-CM

## 2017-07-25 DIAGNOSIS — J069 Acute upper respiratory infection, unspecified: Secondary | ICD-10-CM | POA: Diagnosis not present

## 2017-07-25 MED ORDER — PREDNISONE 20 MG PO TABS: ORAL_TABLET | ORAL | 1 refills | Status: DC

## 2017-07-25 MED ORDER — DOXYCYCLINE HYCLATE 100 MG PO TABS: 100.0000 mg | ORAL_TABLET | Freq: Two times a day (BID) | ORAL | 0 refills | Status: DC

## 2017-07-25 NOTE — Progress Notes (Signed)
  Subjective:     Patient ID: Jordan Ortega, female   DOB: 1955/03/01, 62 y.o.   MRN: [id number] Chief Complaint  Patient presents with  . Cough    Patient comes in office today with concern of cough since 07/23/17, patient reports symptoms of burning in chest when coughing, shortness of breath and wheezing. Patient has been taking otc Robitussin and using her Albuterol and Symbicort inhalers.    HPI States she is not smoking since becoming ill. Also reports sore throat and mild sinus congestion.  Review of Systems     Objective:   Physical Exam  Constitutional: She appears well-developed and well-nourished. No distress.  Ears: T.M's intact without inflammation Throat: no tonsillar enlargement or exudate Neck: no cervical adenopathy Lungs: clear with mildly diminished breath sounds.     Assessment:    1. Upper respiratory tract infection, unspecified type  2. COPD exacerbation (HCC) - predniSONE (DELTASONE) 20 MG tablet; One pill twice daily for 5 days  Dispense: 10 tablet; Refill: 1 - doxycycline (VIBRA-TABS) 100 MG tablet; Take 1 tablet (100 mg total) by mouth 2 (two) times daily.  Dispense: 14 tablet; Refill: 0     Plan:    Discussed use of albuterol and expectorant.

## 2017-07-25 NOTE — Patient Instructions (Signed)
Continue inhalers. May use albuterol up to every 4-6 hours. Add an expectorant as needed like Mucinex. Let us know if new symptoms or not improving.

## 2017-07-26 ENCOUNTER — Ambulatory Visit: Payer: Self-pay | Admitting: Family Medicine

## 2017-07-26 ENCOUNTER — Encounter: Payer: Self-pay | Admitting: Family Medicine

## 2017-07-26 ENCOUNTER — Telehealth: Payer: Self-pay | Admitting: Family Medicine

## 2017-07-26 NOTE — Telephone Encounter (Signed)
Patient advised.KW 

## 2017-07-26 NOTE — Telephone Encounter (Signed)
Please review. KW 

## 2017-07-26 NOTE — Telephone Encounter (Signed)
Pt stated she had OV with Mikki Santee 07/25/17 and she forgot to get her work note excusing her from work 07/25/17 and returning today 07/26/17. Pt would like to pick up today if possible. Please advise. Thanks TNP

## 2017-07-26 NOTE — Telephone Encounter (Signed)
Work excuse up front for pickup.

## 2017-08-02 ENCOUNTER — Ambulatory Visit: Payer: BC Managed Care – PPO | Admitting: Family Medicine

## 2017-08-02 ENCOUNTER — Encounter: Payer: Self-pay | Admitting: Family Medicine

## 2017-08-02 VITALS — BP 104/60 | HR 56 | Temp 98.1°F | Wt 115.8 lb

## 2017-08-02 DIAGNOSIS — R05 Cough: Secondary | ICD-10-CM | POA: Diagnosis not present

## 2017-08-02 DIAGNOSIS — J449 Chronic obstructive pulmonary disease, unspecified: Secondary | ICD-10-CM

## 2017-08-02 DIAGNOSIS — J4540 Moderate persistent asthma, uncomplicated: Secondary | ICD-10-CM

## 2017-08-02 DIAGNOSIS — R059 Cough, unspecified: Secondary | ICD-10-CM

## 2017-08-02 MED ORDER — BENZONATATE 200 MG PO CAPS: 200.0000 mg | ORAL_CAPSULE | Freq: Two times a day (BID) | ORAL | 0 refills | Status: DC | PRN

## 2017-08-02 NOTE — Progress Notes (Signed)
[name] Female    DOB: [birth year]   62 y.o.   MRN: [id number] Visit Date: 08/02/2017  Today's Provider: Vernie Murders, PA   Chief Complaint  Patient presents with  . URI    follow up    Subjective:    HPI Patient presents today to follow up from [place] on 07/25/17. Patient seen Mikki Santee she was diagnosed with URI. Patient  was advised to start Prednisone 20 mg,  Doxycycline 100 mg, and use Albuterol along with expectorant. She reports good compliance with treatment plan. Patient states symptoms are improving, but still coughing a lot.     Past Medical History:  Diagnosis Date  . Adnexal mass   . Anxiety   . Barrett's esophagus   . Bell palsy    left foot also ,   . Bronchitis   . Complication of anesthesia    difficulty waking up  . Degenerative disc disease, cervical   . Depression   . Environmental and seasonal allergies   . GERD (gastroesophageal reflux disease)   . High triglycerides   . Migraines   . Migraines   . Rapid heart rate    Past Surgical History:  Procedure Laterality Date  . ABDOMINAL HYSTERECTOMY    . APPENDECTOMY    . CHOLECYSTECTOMY    . COLONOSCOPY WITH PROPOFOL N/A 07/16/2016   Procedure: COLONOSCOPY WITH PROPOFOL;  Surgeon: [name], MD;  Location: [facility] ENDOSCOPY;  Service: Endoscopy;  Laterality: N/A;  . ESOPHAGOGASTRODUODENOSCOPY (EGD) WITH PROPOFOL N/A 07/16/2016   Procedure: ESOPHAGOGASTRODUODENOSCOPY (EGD) WITH PROPOFOL;  Surgeon: [name], MD;  Location: [facility] ENDOSCOPY;  Service: Endoscopy;  Laterality: N/A;  . [place]  . LAPAROSCOPIC SALPINGO OOPHERECTOMY Bilateral 08/27/2016   Procedure: LAPAROSCOPIC SALPINGO OOPHORECTOMY;  Surgeon: Gae Dry, MD;  Location: [facility] ORS;  Service: Gynecology;  Laterality: Bilateral;  . TUBAL LIGATION  1983   Family History  Problem Relation Age of Onset  . Breast cancer Mother   . Skin cancer Mother   . Hypothyroidism Mother   . Migraines Sister   . Colon  cancer Father   . Lung cancer Father   . Heart disease Father   . Diabetes Paternal Grandmother   . Hypertension Paternal Grandmother   . Stroke Paternal Grandmother   . Dementia Paternal Grandfather   . Breast cancer Maternal Aunt   . Diabetes Paternal Uncle   . Breast cancer Cousin    Allergies  Allergen Reactions  . Diazepam Other (See Comments)    Other reaction(s): Hallucination Patient states "it makes me crazy" REACTION: Behavioral problems  . Ciprofloxacin Other (See Comments)    Other reaction(s): Unknown Loopy  . Codeine Hives and Itching    REACTION: Dizziness  . Morphine Hives    Other reaction(s): Unknown  . Niacin And Related Other (See Comments)    flushing  . Toradol [Ketorolac Tromethamine] Itching    Tolerates when taken with Benadryl  . Iodinated Diagnostic Agents Hives    Pt states she had a CT Head with contrast in the 90's and broke out in hives after the injection. SPM    Current Outpatient Medications:  .  ALPRAZolam (XANAX) 0.5 MG tablet, Take 1 tablet (0.5 mg total) by mouth 3 (three) times daily as needed for anxiety., Disp: 90 tablet, Rfl: 0 .  aspirin EC 81 MG tablet, Take 81 mg by mouth daily., Disp: , Rfl:  .  aspirin-acetaminophen-caffeine (EXCEDRIN MIGRAINE) 250-250-65 MG tablet,  Take 1 tablet by mouth every 6 (six) hours as needed for migraine., Disp: , Rfl:  .  baclofen (LIORESAL) 10 MG tablet, TAKE 1 TABLET (10 MG TOTAL) BY MOUTH 2 (TWO) TIMES DAILY, Disp: , Rfl: 2 .  budesonide-formoterol (SYMBICORT) 160-4.5 MCG/ACT inhaler, Inhale 1 puff into the lungs 2 (two) times daily., Disp: 1 Inhaler, Rfl: 0 .  gabapentin (NEURONTIN) 400 MG capsule, TAKE 1 CAPSULE (400 MG TOTAL) BY MOUTH NIGHTLY., Disp: , Rfl: 3 .  HYDROcodone-acetaminophen (NORCO/VICODIN) 5-325 MG tablet, Take 1 tablet by mouth every 6 (six) hours as needed. For pain, Disp: 30 tablet, Rfl: 0 .  loratadine (CLARITIN) 10 MG tablet, Take 10 mg by mouth 2 (two) times daily., Disp: ,  Rfl:  .  pantoprazole (PROTONIX) 40 MG tablet, TAKE 1 TABLET BY MOUTH ONCE DAILY, Disp: 90 tablet, Rfl: 1 .  PROAIR HFA 108 (90 Base) MCG/ACT inhaler, INHALE 2 PUFFS INTO THE LUNGS EVERY 6 HOURS AS NEEDED FOR WHEEZING ORSHORTNESS OF BREATH, Disp: 8.5 g, Rfl: 3 .  Probiotic Product (PROBIOTIC-10) CHEW, Chew 2 tablets by mouth daily., Disp: , Rfl:  .  promethazine (PHENERGAN) 25 MG tablet, Take 1 tablet (25 mg total) by mouth every 8 (eight) hours as needed for nausea or vomiting., Disp: 30 tablet, Rfl: 0 .  QUEtiapine (SEROQUEL) 50 MG tablet, Take 1 tablet (50 mg total) by mouth at bedtime. (Patient taking differently: Take 75 mg by mouth at bedtime. Takes 1.5 tablets), Disp: 90 tablet, Rfl: 3 .  simvastatin (ZOCOR) 40 MG tablet, TAKE ONE TABLET BY MOUTH NIGHTLY AT BEDTIME, Disp: 30 tablet, Rfl: 5 .  Specialty Vitamins Products (BIOTIN PLUS KERATIN) 10000-100 MCG-MG TABS, Take 1 tablet by mouth daily., Disp: , Rfl:  .  metoprolol tartrate (LOPRESSOR) 25 MG tablet, Take 25 mg by mouth 2 (two) times daily. , Disp: , Rfl:   Review of Systems  Constitutional: Negative.   HENT: Positive for congestion.   Respiratory: Positive for cough.   Cardiovascular: Negative.    Social History   Tobacco Use  . Smoking status: Current Every Day Smoker    Packs/day: 0.50    Years: 30.00    Pack years: 15.00    Types: Cigarettes  . Smokeless tobacco: Never Used  Substance Use Topics  . Alcohol use: Not Currently    Alcohol/week: 0.0 oz   Objective:   BP 104/60 (BP Location: Right Arm, Patient Position: Sitting, Cuff Size: Normal)   Pulse (!) 56   Temp 98.1 F (36.7 C) (Oral)   Wt 115 lb 12.8 oz (52.5 kg)   SpO2 99%   BMI 22.62 kg/m  Vitals:   08/02/17 0835  BP: 104/60  Pulse: (!) 56  Temp: 98.1 F (36.7 C)  TempSrc: Oral  SpO2: 99%  Weight: 115 lb 12.8 oz (52.5 kg)   Physical Exam  Constitutional: She is oriented to person, place, and time. She appears well-developed and well-nourished.  No distress.  HENT:  Head: Normocephalic and atraumatic.  Right Ear: Hearing normal.  Left Ear: Hearing normal.  Nose: Nose normal.  Eyes: Conjunctivae and lids are normal. Right eye exhibits no discharge. Left eye exhibits no discharge. No scleral icterus.  Cardiovascular: Normal rate.  Pulmonary/Chest: Effort normal and breath sounds normal. No respiratory distress.  Abdominal: Soft.  Musculoskeletal: Normal range of motion.  Neurological: She is alert and oriented to person, place, and time.  Skin: Skin is intact. No lesion and no rash noted.  Psychiatric: She has a  normal mood and affect. Her speech is normal and behavior is normal. Thought content normal.      Assessment & Plan:     1. Cough No further sputum production and no fevers. Finished the Doxycycline and Prednisone. Improved but cough still keeping her awake at night. Recommend Tessalon Perles and may use cough drop during the day. Recommend she stop all smoking (may need to consider Welbutrin since she can't take the Chantix and no help from the nicotine patches). - benzonatate (TESSALON) 200 MG capsule; Take 1 capsule (200 mg total) by mouth 2 (two) times daily as needed for cough.  Dispense: 20 capsule; Refill: 0  2. Moderate persistent asthma with irreversible airway obstruction without complication (HCC) Chronic cough and recurring congestion with spirometry showing moderate airway obstruction on 03-21-17, may be the early signs of impending COPD in this 30 year smoker. Still feels she is getting some relief from Albuterol prn and Symbicort BID daily. Advised to stop all smoking and recheck if cough no better in the next 2 weeks. May need Spiriva or Atrovent added to her Symbicort 160-4.5 mcg/ACT 1 puff BID and Albuterol-HFA 2 puffs QID prn.        Vernie Murders, PA  [facility]

## 2017-08-19 ENCOUNTER — Ambulatory Visit
Admission: RE | Admit: 2017-08-19 | Discharge: 2017-08-19 | Disposition: A | Discharge status: Home / Self Care | Payer: BC Managed Care – PPO | Source: Ambulatory Visit | Attending: Physician Assistant | Admitting: Physician Assistant

## 2017-08-19 ENCOUNTER — Encounter: Payer: Self-pay | Admitting: Physician Assistant

## 2017-08-19 ENCOUNTER — Ambulatory Visit (INDEPENDENT_AMBULATORY_CARE_PROVIDER_SITE_OTHER): Payer: BC Managed Care – PPO | Admitting: Physician Assistant

## 2017-08-19 VITALS — BP 100/60 | HR 52 | Temp 98.1°F | Resp 16 | Wt 119.2 lb

## 2017-08-19 DIAGNOSIS — J449 Chronic obstructive pulmonary disease, unspecified: Secondary | ICD-10-CM | POA: Diagnosis not present

## 2017-08-19 DIAGNOSIS — R0781 Pleurodynia: Secondary | ICD-10-CM

## 2017-08-19 MED ORDER — METHYLPREDNISOLONE 4 MG PO TBPK
ORAL_TABLET | ORAL | 0 refills | Status: DC
Start: 2017-08-19 — End: 2017-10-07

## 2017-08-19 NOTE — Progress Notes (Signed)
[name] Female    DOB: 05/01/1955   62 y.o.   MRN: [id number] Visit Date: 08/19/2017  Today's Provider: Mar Daring, PA-C   No chief complaint on file.  Subjective:    HPI Patient here today with c/o possible pulled muscle. Location:under left breast. She reports she went to put her grandson to sleep on the crib, who is 15 months. She reports that her rib was right on the rail of the crib and she heard something pop and it hurt really bad that night and started to feel better. She reports that since she was feeling better she did yard work and heard another pop sound last Saturday it has been hurting since then. It gets aggravated by breathing and sitting. Pain radiates to the back under her shoulder blade. She reports it feels like "rubber band. Treatment:Vicodin and is not touching it.    Allergies  Allergen Reactions  . Diazepam Other (See Comments)    Other reaction(s): Hallucination Patient states "it makes me crazy" REACTION: Behavioral problems  . Ciprofloxacin Other (See Comments)    Other reaction(s): Unknown Loopy  . Codeine Hives and Itching    REACTION: Dizziness  . Morphine Hives    Other reaction(s): Unknown  . Niacin And Related Other (See Comments)    flushing  . Toradol [Ketorolac Tromethamine] Itching    Tolerates when taken with Benadryl  . Iodinated Diagnostic Agents Hives    Pt states she had a CT Head with contrast in the 90's and broke out in hives after the injection. SPM     Current Outpatient Medications:  .  ALPRAZolam (XANAX) 0.5 MG tablet, Take 1 tablet (0.5 mg total) by mouth 3 (three) times daily as needed for anxiety., Disp: 90 tablet, Rfl: 0 .  aspirin EC 81 MG tablet, Take 81 mg by mouth daily., Disp: , Rfl:  .  aspirin-acetaminophen-caffeine (EXCEDRIN MIGRAINE) 250-250-65 MG tablet, Take 1 tablet by mouth every 6 (six) hours as needed for migraine., Disp: , Rfl:  .  baclofen (LIORESAL) 10 MG tablet, TAKE 1  TABLET (10 MG TOTAL) BY MOUTH 2 (TWO) TIMES DAILY, Disp: , Rfl: 2 .  budesonide-formoterol (SYMBICORT) 160-4.5 MCG/ACT inhaler, Inhale 1 puff into the lungs 2 (two) times daily., Disp: 1 Inhaler, Rfl: 0 .  HYDROcodone-acetaminophen (NORCO/VICODIN) 5-325 MG tablet, Take 1 tablet by mouth every 6 (six) hours as needed. For pain, Disp: 30 tablet, Rfl: 0 .  loratadine (CLARITIN) 10 MG tablet, Take 10 mg by mouth 2 (two) times daily., Disp: , Rfl:  .  metoprolol tartrate (LOPRESSOR) 25 MG tablet, Take 25 mg by mouth 2 (two) times daily. , Disp: , Rfl:  .  pantoprazole (PROTONIX) 40 MG tablet, TAKE 1 TABLET BY MOUTH ONCE DAILY, Disp: 90 tablet, Rfl: 1 .  PROAIR HFA 108 (90 Base) MCG/ACT inhaler, INHALE 2 PUFFS INTO THE LUNGS EVERY 6 HOURS AS NEEDED FOR WHEEZING ORSHORTNESS OF BREATH, Disp: 8.5 g, Rfl: 3 .  Probiotic Product (PROBIOTIC-10) CHEW, Chew 2 tablets by mouth daily., Disp: , Rfl:  .  QUEtiapine (SEROQUEL) 50 MG tablet, Take 1 tablet (50 mg total) by mouth at bedtime. (Patient taking differently: Take 75 mg by mouth at bedtime. Takes 1.5 tablets), Disp: 90 tablet, Rfl: 3 .  simvastatin (ZOCOR) 40 MG tablet, TAKE ONE TABLET BY MOUTH NIGHTLY AT BEDTIME, Disp: 30 tablet, Rfl: 5 .  Specialty Vitamins Products (BIOTIN PLUS KERATIN) 10000-100 MCG-MG TABS, Take 1  tablet by mouth daily., Disp: , Rfl:  .  benzonatate (TESSALON) 200 MG capsule, Take 1 capsule (200 mg total) by mouth 2 (two) times daily as needed for cough. (Patient not taking: Reported on 08/19/2017), Disp: 20 capsule, Rfl: 0 .  gabapentin (NEURONTIN) 400 MG capsule, TAKE 1 CAPSULE (400 MG TOTAL) BY MOUTH NIGHTLY., Disp: , Rfl: 3 .  promethazine (PHENERGAN) 25 MG tablet, Take 1 tablet (25 mg total) by mouth every 8 (eight) hours as needed for nausea or vomiting. (Patient not taking: Reported on 08/19/2017), Disp: 30 tablet, Rfl: 0  Review of Systems  Constitutional: Negative.   Respiratory: Negative.   Cardiovascular: Negative.     Musculoskeletal: Positive for arthralgias and myalgias.  Skin: Negative for rash.  Neurological: Negative.     Social History   Tobacco Use  . Smoking status: Current Every Day Smoker    Packs/day: 0.50    Years: 30.00    Pack years: 15.00    Types: Cigarettes  . Smokeless tobacco: Never Used  Substance Use Topics  . Alcohol use: Not Currently    Alcohol/week: 0.0 oz   Objective:   BP 100/60 (BP Location: Left Arm, Patient Position: Sitting, Cuff Size: Normal)   Pulse (!) 52   Temp 98.1 F (36.7 C) (Oral)   Resp 16   Wt 119 lb 3.2 oz (54.1 kg)   BMI 23.28 kg/m  Vitals:   08/19/17 1449  BP: 100/60  Pulse: (!) 52  Resp: 16  Temp: 98.1 F (36.7 C)  TempSrc: Oral  Weight: 119 lb 3.2 oz (54.1 kg)     Physical Exam  Constitutional: She appears well-developed and well-nourished. No distress.  Neck: Normal range of motion. Neck supple.  Cardiovascular: Normal rate, regular rhythm and normal heart sounds. Exam reveals no gallop and no friction rub.  No murmur heard. Pulmonary/Chest: Effort normal and breath sounds normal. No respiratory distress. She has no wheezes. She has no rales.  Musculoskeletal:       Arms: Skin: She is not diaphoretic.  Vitals reviewed.      Assessment & Plan:     1. Rib pain on left side Suspect rib contusion vs fracture. Will get imaging as below. I will f/u pending xray results. Discussed also deep breathing exercises and rib belt (make sure to not apply too tightly to restrict breathing). Medrol dose pak sent as below for inflammation. May apply heating pad. Call if no improvements in 8-12 weeks or if symptoms worsen in the meantime.  - DG Ribs Unilateral Left; Future - methylPREDNISolone (MEDROL) 4 MG TBPK tablet; 6 day taper; take as directed on package instructions  Dispense: 21 tablet; Refill: 0       Mar Daring, PA-C  [facility]

## 2017-08-19 NOTE — Patient Instructions (Signed)
Rib Contusion A rib contusion is a deep bruise on your rib area. Contusions are the result of a blunt trauma that causes bleeding and injury to the tissues under the skin. A rib contusion may involve bruising of the ribs and of the skin and muscles in the area. The skin overlying the contusion may turn blue, purple, or yellow. Minor injuries will give you a painless contusion, but more severe contusions may stay painful and swollen for a few weeks. What are the causes? A contusion is usually caused by a blow, trauma, or direct force to an area of the body. This often occurs while playing contact sports. What are the signs or symptoms?  Swelling and redness of the injured area.  Discoloration of the injured area.  Tenderness and soreness of the injured area.  Pain with or without movement. How is this diagnosed? The diagnosis can be made by taking a medical history and performing a physical exam. An X-ray, CT scan, or MRI may be needed to determine if there were any associated injuries, such as broken bones (fractures) or internal injuries. How is this treated? Often, the best treatment for a rib contusion is rest. Icing or applying cold compresses to the injured area may help reduce swelling and inflammation. Deep breathing exercises may be recommended to reduce the risk of partial lung collapse and pneumonia. Over-the-counter or prescription medicines may also be recommended for pain control. Follow these instructions at home:  Apply ice to the injured area: ? Put ice in a plastic bag. ? Place a towel between your skin and the bag. ? Leave the ice on for 20 minutes, 2-3 times per day.  Take medicines only as directed by your health care provider.  Rest the injured area. Avoid strenuous activity and any activities or movements that cause pain. Be careful during activities and avoid bumping the injured area.  Perform deep-breathing exercises as directed by your health care provider.  Do  not lift anything that is heavier than 5 lb (2.3 kg) until your health care provider approves.  Do not use any tobacco products, including cigarettes, chewing tobacco, or electronic cigarettes. If you need help quitting, ask your health care provider. Contact a health care provider if:  You have increased bruising or swelling.  You have pain that is not controlled with treatment.  You have a fever. Get help right away if:  You have difficulty breathing or shortness of breath.  You develop a continual cough, or you cough up thick or bloody sputum.  You feel sick to your stomach (nauseous), you throw up (vomit), or you have abdominal pain. This information is not intended to replace advice given to you by your health care provider. Make sure you discuss any questions you have with your health care provider. Document Released: 10/13/2000 Document Revised: 06/26/2015 Document Reviewed: 10/30/2013 Elsevier Interactive Patient Education  2018 [employer]vier Inc[employer]  

## 2017-08-22 ENCOUNTER — Telehealth: Payer: Self-pay

## 2017-08-22 NOTE — Telephone Encounter (Signed)
LMTCB  Thanks,  -Joseline 

## 2017-08-22 NOTE — Telephone Encounter (Signed)
-----   Message from Mar Daring, PA-C sent at 08/21/2017  8:51 PM EDT ----- No rib fracture or displacement noted. Continue conservative therapy and call if symptoms worsen.

## 2017-08-22 NOTE — Telephone Encounter (Signed)
Patient advised.

## 2017-08-24 ENCOUNTER — Other Ambulatory Visit: Payer: Self-pay | Admitting: Family Medicine

## 2017-09-02 ENCOUNTER — Encounter: Payer: Self-pay | Admitting: Physician Assistant

## 2017-09-02 ENCOUNTER — Ambulatory Visit (INDEPENDENT_AMBULATORY_CARE_PROVIDER_SITE_OTHER): Payer: BC Managed Care – PPO | Admitting: Physician Assistant

## 2017-09-02 VITALS — BP 100/60 | HR 53 | Temp 98.6°F | Resp 16 | Wt 115.2 lb

## 2017-09-02 DIAGNOSIS — G43411 Hemiplegic migraine, intractable, with status migrainosus: Secondary | ICD-10-CM

## 2017-09-02 MED ORDER — KETOROLAC TROMETHAMINE 30 MG/ML IJ SOLN
30.0000 mg | Freq: Once | INTRAMUSCULAR | Status: AC
  Administered 2017-09-02: 30 mg via INTRAMUSCULAR

## 2017-09-02 MED ORDER — PROMETHAZINE HCL 25 MG/ML IJ SOLN
25.0000 mg | Freq: Once | INTRAMUSCULAR | Status: AC
  Administered 2017-09-02: 25 mg via INTRAMUSCULAR

## 2017-09-02 MED ORDER — DIPHENHYDRAMINE HCL 25 MG PO CAPS
25.0000 mg | ORAL_CAPSULE | Freq: Once | ORAL | Status: AC
  Administered 2017-09-02: 25 mg via ORAL

## 2017-09-02 NOTE — Progress Notes (Signed)
[name] Female    DOB: [birth year]   62 y.o.   MRN: [id number] Visit Date: 09/02/2017  Today's Provider: Mar Daring, PA-C   Chief Complaint  Patient presents with  . Headache   Subjective:    HPI Patient here today with c/o migraine. This is a chronic issue. She came home yesterday evening with her migraine and woke this morning at 4 am with the severe migraine and took 1/2 Vicodin and BC powder a quarter after 7 am.     Allergies  Allergen Reactions  . Diazepam Other (See Comments)    Other reaction(s): Hallucination Patient states "it makes me crazy" REACTION: Behavioral problems  . Ciprofloxacin Other (See Comments)    Other reaction(s): Unknown Loopy  . Codeine Hives and Itching    REACTION: Dizziness  . Morphine Hives    Other reaction(s): Unknown  . Niacin And Related Other (See Comments)    flushing  . Toradol [Ketorolac Tromethamine] Itching    Tolerates when taken with Benadryl  . Iodinated Diagnostic Agents Hives    Pt states she had a CT Head with contrast in the 90's and broke out in hives after the injection. SPM     Current Outpatient Medications:  .  ALPRAZolam (XANAX) 0.5 MG tablet, Take 1 tablet (0.5 mg total) by mouth 3 (three) times daily as needed for anxiety., Disp: 90 tablet, Rfl: 0 .  aspirin EC 81 MG tablet, Take 81 mg by mouth daily., Disp: , Rfl:  .  baclofen (LIORESAL) 10 MG tablet, TAKE 1 TABLET (10 MG TOTAL) BY MOUTH 2 (TWO) TIMES DAILY, Disp: , Rfl: 2 .  budesonide-formoterol (SYMBICORT) 160-4.5 MCG/ACT inhaler, Inhale 1 puff into the lungs 2 (two) times daily., Disp: 1 Inhaler, Rfl: 0 .  gabapentin (NEURONTIN) 100 MG capsule, Take 1 capsule (100 mg total) by mouth 3 (three) times daily., Disp: 90 capsule, Rfl: 3 .  gabapentin (NEURONTIN) 300 MG capsule, Take 1 capsule (300 mg total) by mouth at bedtime., Disp: 30 capsule, Rfl: 0 .  HYDROcodone-acetaminophen (NORCO/VICODIN) 5-325 MG tablet, Take 1 tablet by  mouth every 6 (six) hours as needed. For pain, Disp: 30 tablet, Rfl: 0 .  loratadine (CLARITIN) 10 MG tablet, Take 10 mg by mouth 2 (two) times daily., Disp: , Rfl:  .  pantoprazole (PROTONIX) 40 MG tablet, TAKE 1 TABLET BY MOUTH ONCE DAILY, Disp: 90 tablet, Rfl: 1 .  PROAIR HFA 108 (90 Base) MCG/ACT inhaler, INHALE 2 PUFFS INTO THE LUNGS EVERY 6 HOURS AS NEEDED FOR WHEEZING ORSHORTNESS OF BREATH, Disp: 8.5 g, Rfl: 3 .  Probiotic Product (PROBIOTIC-10) CHEW, Chew 2 tablets by mouth daily., Disp: , Rfl:  .  QUEtiapine (SEROQUEL) 50 MG tablet, Take 1 tablet (50 mg total) by mouth at bedtime. (Patient taking differently: Take 75 mg by mouth at bedtime. Takes 1.5 tablets), Disp: 90 tablet, Rfl: 3 .  simvastatin (ZOCOR) 40 MG tablet, TAKE 1 TABLET BY MOUTH AT BEDTIME, Disp: 30 tablet, Rfl: 5 .  Specialty Vitamins Products (BIOTIN PLUS KERATIN) 10000-100 MCG-MG TABS, Take 1 tablet by mouth daily., Disp: , Rfl:  .  aspirin-acetaminophen-caffeine (EXCEDRIN MIGRAINE) 250-250-65 MG tablet, Take 1 tablet by mouth every 6 (six) hours as needed for migraine., Disp: , Rfl:  .  methylPREDNISolone (MEDROL) 4 MG TBPK tablet, 6 day taper; take as directed on package instructions (Patient not taking: Reported on 09/02/2017), Disp: 21 tablet, Rfl: 0 .  metoprolol tartrate (LOPRESSOR)  25 MG tablet, Take 25 mg by mouth 2 (two) times daily. , Disp: , Rfl:  .  promethazine (PHENERGAN) 25 MG tablet, Take 1 tablet (25 mg total) by mouth every 8 (eight) hours as needed for nausea or vomiting. (Patient not taking: Reported on 08/19/2017), Disp: 30 tablet, Rfl: 0  Review of Systems  Constitutional: Positive for fatigue.  Eyes: Positive for photophobia and visual disturbance.  Respiratory: Negative.   Cardiovascular: Negative.   Gastrointestinal: Positive for nausea.  Neurological: Positive for headaches.    Social History   Tobacco Use  . Smoking status: Current Every Day Smoker    Packs/day: 0.50    Years: 30.00     Pack years: 15.00    Types: Cigarettes  . Smokeless tobacco: Never Used  Substance Use Topics  . Alcohol use: Not Currently    Alcohol/week: 0.0 oz   Objective:   BP 100/60 (BP Location: Right Arm, Patient Position: Lying right side, Cuff Size: Normal)   Pulse (!) 53   Temp 98.6 F (37 C) (Oral)   Resp 16   Wt 115 lb 3.2 oz (52.3 kg)   BMI 22.50 kg/m  Vitals:   09/02/17 1039  BP: 100/60  Pulse: (!) 53  Resp: 16  Temp: 98.6 F (37 C)  TempSrc: Oral  Weight: 115 lb 3.2 oz (52.3 kg)     Physical Exam  Constitutional: She appears well-developed and well-nourished. She appears lethargic. No distress.  Neck: Normal range of motion. Neck supple. No JVD present. No tracheal deviation present. No thyromegaly present.  Cardiovascular: Normal rate, regular rhythm and normal heart sounds. Exam reveals no gallop and no friction rub.  No murmur heard. Pulmonary/Chest: Effort normal and breath sounds normal. No respiratory distress. She has no wheezes. She has no rales.  Lymphadenopathy:    She has no cervical adenopathy.  Neurological: She appears lethargic. No sensory deficit. Coordination and gait normal.  Left side facial droop  Skin: She is not diaphoretic.  Vitals reviewed.      Assessment & Plan:     1. Intractable hemiplegic migraine with status migrainosus Patient here with typical migraine. Will give toradol, phenergan IM and oral benadryl (given due to toradol allergy). She sat for 15 minutes and tolerated well. Husband was here with her to drive her home. Call if symptoms worsen or do not resolve.  - ketorolac (TORADOL) 30 MG/ML injection 30 mg - promethazine (PHENERGAN) injection 25 mg - diphenhydrAMINE (BENADRYL) capsule 25 mg       Mar Daring, PA-C  [place]

## 2017-09-12 ENCOUNTER — Other Ambulatory Visit: Payer: Self-pay | Admitting: Family Medicine

## 2017-09-12 DIAGNOSIS — Z1231 Encounter for screening mammogram for malignant neoplasm of breast: Secondary | ICD-10-CM

## 2017-09-21 ENCOUNTER — Other Ambulatory Visit: Payer: Self-pay

## 2017-09-21 ENCOUNTER — Emergency Department
Admission: EM | Admit: 2017-09-21 | Discharge: 2017-09-21 | Disposition: A | Discharge status: Home / Self Care | Payer: BC Managed Care – PPO | Attending: Emergency Medicine | Admitting: Emergency Medicine

## 2017-09-21 ENCOUNTER — Emergency Department: Payer: BC Managed Care – PPO

## 2017-09-21 DIAGNOSIS — F17210 Nicotine dependence, cigarettes, uncomplicated: Secondary | ICD-10-CM | POA: Insufficient documentation

## 2017-09-21 DIAGNOSIS — G43909 Migraine, unspecified, not intractable, without status migrainosus: Secondary | ICD-10-CM

## 2017-09-21 DIAGNOSIS — R4182 Altered mental status, unspecified: Secondary | ICD-10-CM | POA: Diagnosis present

## 2017-09-21 DIAGNOSIS — Z79899 Other long term (current) drug therapy: Secondary | ICD-10-CM | POA: Insufficient documentation

## 2017-09-21 DIAGNOSIS — G43009 Migraine without aura, not intractable, without status migrainosus: Secondary | ICD-10-CM | POA: Diagnosis not present

## 2017-09-21 LAB — CBC WITH DIFFERENTIAL/PLATELET
Basophils Absolute: 0.1 K/uL (ref 0–0.1)
Basophils Relative: 1 %
Eosinophils Absolute: 0.4 K/uL (ref 0–0.7)
Eosinophils Relative: 4 %
HCT: 38.3 % (ref 35.0–47.0)
Hemoglobin: 13.5 g/dL (ref 12.0–16.0)
Lymphocytes Relative: 40 %
Lymphs Abs: 3.5 K/uL (ref 1.0–3.6)
MCH: 33.8 pg (ref 26.0–34.0)
MCHC: 35.2 g/dL (ref 32.0–36.0)
MCV: 96.2 fL (ref 80.0–100.0)
Monocytes Absolute: 0.6 K/uL (ref 0.2–0.9)
Monocytes Relative: 6 %
Neutro Abs: 4.3 K/uL (ref 1.4–6.5)
Neutrophils Relative %: 49 %
Platelets: 200 K/uL (ref 150–440)
RBC: 3.98 MIL/uL (ref 3.80–5.20)
RDW: 13.5 % (ref 11.5–14.5)
WBC: 8.8 K/uL (ref 3.6–11.0)

## 2017-09-21 LAB — COMPREHENSIVE METABOLIC PANEL WITH GFR
ALT: 18 U/L (ref 0–44)
AST: 21 U/L (ref 15–41)
Albumin: 4.6 g/dL (ref 3.5–5.0)
Alkaline Phosphatase: 61 U/L (ref 38–126)
Anion gap: 9 (ref 5–15)
BUN: 21 mg/dL — ABNORMAL HIGH (ref 6–20)
CO2: 25 mmol/L (ref 22–32)
Calcium: 9.2 mg/dL (ref 8.9–10.3)
Chloride: 110 mmol/L (ref 98–111)
Creatinine, Ser: 0.99 mg/dL (ref 0.44–1.00)
GFR calc Af Amer: >60 mL/min
GFR calc non Af Amer: >60 mL/min
Glucose, Bld: 140 mg/dL — ABNORMAL HIGH (ref 70–99)
Potassium: 3.4 mmol/L — ABNORMAL LOW (ref 3.5–5.1)
Sodium: 144 mmol/L (ref 135–145)
Total Bilirubin: 0.2 mg/dL — ABNORMAL LOW (ref 0.3–1.2)
Total Protein: 7.4 g/dL (ref 6.5–8.1)

## 2017-09-21 LAB — LACTIC ACID, PLASMA: Lactic Acid, Venous: 1.2 mmol/L (ref 0.5–1.9)

## 2017-09-21 LAB — GLUCOSE, CAPILLARY: GLUCOSE-CAPILLARY: 134 mg/dL — AB (ref 70–99)

## 2017-09-21 LAB — TROPONIN I

## 2017-09-21 LAB — MAGNESIUM: Magnesium: 2.4 mg/dL (ref 1.7–2.4)

## 2017-09-21 MED ORDER — METOCLOPRAMIDE HCL 5 MG/ML IJ SOLN
10.0000 mg | Freq: Once | INTRAMUSCULAR | Status: AC
  Administered 2017-09-21: 10 mg via INTRAVENOUS
  Filled 2017-09-21: qty 2

## 2017-09-21 MED ORDER — KETOROLAC TROMETHAMINE 30 MG/ML IJ SOLN
30.0000 mg | Freq: Once | INTRAMUSCULAR | Status: AC
  Administered 2017-09-21: 30 mg via INTRAVENOUS
  Filled 2017-09-21: qty 1

## 2017-09-21 MED ORDER — DEXAMETHASONE SODIUM PHOSPHATE 10 MG/ML IJ SOLN
10.0000 mg | Freq: Once | INTRAMUSCULAR | Status: AC
  Administered 2017-09-21: 10 mg via INTRAVENOUS
  Filled 2017-09-21: qty 1

## 2017-09-21 MED ORDER — SODIUM CHLORIDE 0.9 % IV BOLUS
1000.0000 mL | Freq: Once | INTRAVENOUS | Status: AC
  Administered 2017-09-21: 1000 mL via INTRAVENOUS

## 2017-09-21 MED ORDER — DIPHENHYDRAMINE HCL 50 MG/ML IJ SOLN
25.0000 mg | Freq: Once | INTRAMUSCULAR | Status: AC
  Administered 2017-09-21: 25 mg via INTRAVENOUS
  Filled 2017-09-21: qty 1

## 2017-09-21 NOTE — ED Notes (Signed)
FIRST NURSE NOTE:  Pt arrived via POV with husband, states pt's BP has been low and patient has been disoriented since work this morning [id number].  Pt on arrival unable to recall birthday, but is able to tell [place] her name. Pt slow to respond to brief questions in lobby.

## 2017-09-21 NOTE — ED Provider Notes (Signed)
[facility] Emergency Department Provider Note ____________________________________________   First MD Initiated Contact with Patient 09/21/17 1554     (approximate)  I have reviewed the triage vital signs and the nursing notes.   HISTORY  Chief Complaint Altered Mental Status    HPI Jordan Ortega is a 62 y.o. female with PMH as noted below including history of complex migraines who presents with apparent altered mental status, as well as lightheadedness, low blood pressure, and headache.  The patient states that earlier this afternoon she began to feel somewhat disoriented and lightheaded, stating it was similar to feeling as if she was drunk.  She laid down for a while and when she got up, the husband states that he took her blood pressure and it was in the 80s over 50s.  The husband noticed that the patient also seemed somewhat disoriented and slow to respond.  The patient developed a left facial droop around 2 PM.  She has now started to develop a headache while in the ED.  The husband reports that the patient has had facial droop previously multiple times when she has had a migraine although only once last year did the facial droop precede the headache.   Past Medical History:  Diagnosis Date  . Adnexal mass   . Anxiety   . Barrett's esophagus   . Bell palsy    left foot also ,   . Bronchitis   . Complication of anesthesia    difficulty waking up  . Degenerative disc disease, cervical   . Depression   . Environmental and seasonal allergies   . GERD (gastroesophageal reflux disease)   . High triglycerides   . Migraines   . Migraines   . Rapid heart rate     Patient Active Problem List   Diagnosis Date Noted  . Shortness of breath 03/21/2017  . Premature ventricular contractions 10/21/2016  . Heart palpitations 10/05/2016  . Cyst of right ovary 08/18/2016  . Adnexal mass 08/17/2016  . Bilateral carpal tunnel syndrome 04/08/2016  .  Bilateral hand pain 04/08/2016  . 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 Acute onset aura migraine 07/10/2014  . Bad memory 07/10/2014  . Migraine without aura 07/10/2014  . Abdominal pain, epigastric 07/10/2014  . Chills with fever 07/10/2014  . Chronic constipation 07/10/2014  . Weakness of left side of body 11/28/2011  . Acute anxiety 11/20/2009  . DEPRESSION 11/20/2009  . Edema of extremities 11/20/2009  . Hypercholesterolemia without hypertriglyceridemia 06/18/2009  . 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 Malaise and fatigue 10/29/2008  . Abnormal loss of weight 10/29/2008  . 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 Affective bipolar disorder ([place]) 05/17/2008  . Barrett esophagus 05/17/2008    Past Surgical History:  Procedure Laterality Date  . ABDOMINAL HYSTERECTOMY    . APPENDECTOMY    . CHOLECYSTECTOMY    . COLONOSCOPY WITH PROPOFOL N/A 07/16/2016   Procedure: COLONOSCOPY WITH PROPOFOL;  Surgeon: [name], MD;  Location: [facility] ENDOSCOPY;  Service: Endoscopy;  Laterality: N/A;  . ESOPHAGOGASTRODUODENOSCOPY (EGD) WITH PROPOFOL N/A 07/16/2016   Procedure: ESOPHAGOGASTRODUODENOSCOPY (EGD) WITH PROPOFOL;  Surgeon: [name], MD;  Location: [facility] ENDOSCOPY;  Service: Endoscopy;  Laterality: N/A;  . [place]  . LAPAROSCOPIC SALPINGO OOPHERECTOMY Bilateral 08/27/2016   Procedure: LAPAROSCOPIC SALPINGO OOPHORECTOMY;  Surgeon: Gae Dry, MD;  Location: [facility] ORS;  Service: Gynecology;  Laterality: Bilateral;  . TUBAL LIGATION  1983    Prior to Admission medications   Medication Sig Start Date End Date Taking? Authorizing Provider  ALPRAZolam Duanne Moron) 0.5 MG tablet Take 1 tablet (0.5 mg total) by mouth 3 (three) times daily as needed for anxiety. 11/19/16   Chrismon, Vickki Muff, PA  aspirin EC 81 MG tablet Take 81 mg by mouth daily.    [provider]    aspirin-acetaminophen-caffeine (EXCEDRIN MIGRAINE) [phone] MG tablet Take 1 tablet by mouth every 6 (six) hours as needed for migraine.    [provider]  baclofen (LIORESAL) 10 MG tablet TAKE 1 TABLET (10 MG TOTAL) BY MOUTH 2 (TWO) TIMES DAILY 03/25/16   [provider]  budesonide-formoterol (SYMBICORT) 160-4.5 MCG/ACT inhaler Inhale 1 puff into the lungs 2 (two) times daily. 03/21/17   Chrismon, Vickki Muff, PA  gabapentin (NEURONTIN) 100 MG capsule Take 1 capsule (100 mg total) by mouth 3 (three) times daily. 08/19/17   Mar Daring, PA-C  gabapentin (NEURONTIN) 300 MG capsule Take 1 capsule (300 mg total) by mouth at bedtime. 08/19/17   Mar Daring, PA-C  HYDROcodone-acetaminophen (NORCO/VICODIN) 5-325 MG tablet Take 1 tablet by mouth every 6 (six) hours as needed. For pain 06/16/17   Chrismon, Vickki Muff, PA  loratadine (CLARITIN) 10 MG tablet Take 10 mg by mouth 2 (two) times daily.    [provider]  methylPREDNISolone (MEDROL) 4 MG TBPK tablet 6 day taper; take as directed on package instructions Patient not taking: Reported on 09/02/2017 08/19/17   Mar Daring, PA-C  metoprolol tartrate (LOPRESSOR) 25 MG tablet Take 25 mg by mouth 2 (two) times daily.  01/02/16 08/19/17  [provider]  pantoprazole (PROTONIX) 40 MG tablet TAKE 1 TABLET BY MOUTH ONCE DAILY 07/18/17   Chrismon, Vickki Muff, PA  PROAIR HFA 108 (90 Base) MCG/ACT inhaler INHALE 2 PUFFS INTO THE LUNGS EVERY 6 HOURS AS NEEDED FOR WHEEZING ORSHORTNESS OF BREATH 07/25/17   Chrismon, Vickki Muff, PA  Probiotic Product (PROBIOTIC-10) CHEW Chew 2 tablets by mouth daily.    [provider]  promethazine (PHENERGAN) 25 MG tablet Take 1 tablet (25 mg total) by mouth every 8 (eight) hours as needed for nausea or vomiting. Patient not taking: Reported on 08/19/2017 11/19/16   Chrismon, Vickki Muff, PA  QUEtiapine (SEROQUEL) 50 MG tablet Take 1 tablet (50 mg total) by mouth at  bedtime. Patient taking differently: Take 75 mg by mouth at bedtime. Takes 1.5 tablets 04/19/16   Chrismon, Vickki Muff, PA  simvastatin (ZOCOR) 40 MG tablet TAKE 1 TABLET BY MOUTH AT BEDTIME 08/24/17   Mar Daring, PA-C  Specialty Vitamins Products (BIOTIN PLUS KERATIN) 10000-100 MCG-MG TABS Take 1 tablet by mouth daily.    [provider]    Allergies Diazepam; Ciprofloxacin; Codeine; Morphine; Niacin and related; Toradol [ketorolac tromethamine]; and Iodinated diagnostic agents  Family History  Problem Relation Age of Onset  . Breast cancer Mother   . Skin cancer Mother   . Hypothyroidism Mother   . Migraines Sister   . Colon cancer Father   . Lung cancer Father   . Heart disease Father   . Diabetes Paternal Grandmother   . Hypertension Paternal Grandmother   . Stroke Paternal Grandmother   . Dementia Paternal Grandfather   . Breast cancer Maternal Aunt   . Diabetes Paternal Uncle   . Breast cancer Cousin     Social History Social History   Tobacco Use  . Smoking status: Current Every Day  Smoker    Packs/day: 0.50    Years: 30.00    Pack years: 15.00    Types: Cigarettes  . Smokeless tobacco: Never Used  Substance Use Topics  . Alcohol use: Not Currently    Alcohol/week: 0.0 standard drinks  . Drug use: No    Review of Systems  Constitutional: No fever. Eyes: Positive for "wavy" vision. ENT: No sore throat. Cardiovascular: Denies chest pain. Respiratory: Denies shortness of breath. Gastrointestinal: No vomiting.  Genitourinary: Negative for dysuria.  Musculoskeletal: Negative for back pain. Skin: Negative for rash. Neurological: Positive for headache.  Positive for left facial droop.  Negative for left upper or lower extremity numbness or weakness.   ____________________________________________   PHYSICAL EXAM:  VITAL SIGNS: ED Triage Vitals [09/21/17 1542]  Enc Vitals Group     BP (!) 111/52     Pulse Rate 62     Resp 14     Temp  98.4 F (36.9 C)     Temp Source Oral     SpO2 100 %     Weight      Height      Head Circumference      Peak Flow      Pain Score 0     Pain Loc      Pain Edu?      Excl. in [place]?     Constitutional: Alert and oriented.  Uncomfortable appearing.   Eyes: Conjunctivae are normal.  EOMI.  PERRLA. Head: Atraumatic. Nose: No congestion/rhinnorhea. Mouth/Throat: Mucous membranes are moist.   Neck: Normal range of motion.  Cardiovascular: Normal rate, regular rhythm. Grossly normal heart sounds.  Good peripheral circulation. Respiratory: Normal respiratory effort.  No retractions. Lungs CTAB. Gastrointestinal: Soft and nontender. No distention.  Genitourinary: No flank tenderness. Musculoskeletal: No lower extremity edema.  Extremities warm and well perfused.  Neurologic:  Normal speech.  Left facial droop, sparing forehead.  Motor and sensory intact in all extremities.  Skin:  Skin is warm and dry. No rash noted. Psychiatric: Somewhat anxious appearing.  ____________________________________________   LABS (all labs ordered are listed, but only abnormal results are displayed)  Labs Reviewed  GLUCOSE, CAPILLARY - Abnormal; Notable for the following components:      Result Value   Glucose-Capillary 134 (*)    All other components within normal limits  COMPREHENSIVE METABOLIC PANEL - Abnormal; Notable for the following components:   Potassium 3.4 (*)    Glucose, Bld 140 (*)    BUN 21 (*)    Total Bilirubin 0.2 (*)    All other components within normal limits  CBC WITH DIFFERENTIAL/PLATELET  TROPONIN I  LACTIC ACID, PLASMA  MAGNESIUM  URINALYSIS, COMPLETE (UACMP) WITH MICROSCOPIC   ____________________________________________  EKG  ED ECG REPORT I, Arta Silence, the attending physician, personally viewed and interpreted this ECG.  Date: 09/21/2017 EKG Time: 1538 Rate: 62 Rhythm: normal sinus rhythm QRS Axis: normal Intervals: normal ST/T Wave abnormalities:  normal Narrative Interpretation: no evidence of acute ischemia  ____________________________________________  RADIOLOGY  CT head: No ICH or other acute abnormality  ____________________________________________   PROCEDURES  Procedure(s) performed: No  Procedures  Critical Care performed: Yes  CRITICAL CARE Performed by: Arta Silence   Total critical care time: 20 minutes  Critical care time was exclusive of separately billable procedures and treating other patients.  Critical care was necessary to treat or prevent imminent or life-threatening deterioration.  Critical care was time spent personally by me on the following activities: development  of treatment plan with patient and/or surrogate as well as nursing, discussions with consultants, evaluation of patient's response to treatment, examination of patient, obtaining history from patient or surrogate, ordering and performing treatments and interventions, ordering and review of laboratory studies, ordering and review of radiographic studies, pulse oximetry and re-evaluation of patient's condition. ____________________________________________   INITIAL IMPRESSION / ASSESSMENT AND PLAN / ED COURSE  Pertinent labs & imaging results that were available during my care of the patient were reviewed by me and considered in my medical decision making (see chart for details).  62 year old female with PMH as noted above presents with altered mental status, generalized weakness, and low blood pressure, as well as left facial droop and subsequent development of headache.  I reviewed the past medical records in Epic; the patient was seen in the ED in December 2018 for an episode of left facial droop, which then developed into a complex migraine.  She was worked up for stroke with CT and MRI, both of which were negative and the symptoms resolved after she was treated for migraine.  The husband states that the patient has had the  facial droop multiple times in association with migraines, although only that time did it precede the headache.  He states that the patient has not had confusion or altered mental status associated with these before.  On exam, the patient is somewhat uncomfortable appearing, she has a left facial droop but her neurologic exam is otherwise normal.  The remainder of the exam is unremarkable.  Overall I suspect most likely complex migraine.  Given the prior history of nearly identical symptoms, there is no indication for activation of code stroke and I do not believe that the patient is an appropriate candidate for tPA.  Differential also includes infection/sepsis, electrolyte abnormality or other metabolic cause, or less likely cardiac etiology.  We will obtain a CT to rule out acute CNS cause such as bleed, lab work-up, treat for migraine, and reassess.  ----------------------------------------- 8:58 PM on 09/21/2017 -----------------------------------------  CT head showed no acute findings.  The patient's headache and facial droop began to improve immediately after the initial medications, and now after several hours her symptoms have completely resolved.  She no longer has any facial droop and her mental status has been at her baseline since the initial arrival in the ED.  The lab work-up is unremarkable.  The patient has not provided a UA, however she denies any urinary symptoms and has no fever or other symptoms to suggest UTI.  I had an extensive discussion with the patient about the results of the work-up and the likely causes.  Given the presence of isolated facial droop and no other neurologic findings, associated with the headache identical to prior migraines and the transient confusion, the overall most likely etiology by far is a complex migraine, possibly with some component of dehydration exacerbating and/or resulting in the confusion.  Given that she has had nearly an identical  presentation previously with negative imaging and work-up, my suspicion for TIA or acute stroke is extremely low.  I discussed this with the patient and she agrees; she does not want to stay for additional monitoring or testing.  She has a neurologist and a primary care doctor and she agrees to follow-up with them.  Return precautions given, and she expresses understanding.  ____________________________________________   FINAL CLINICAL IMPRESSION(S) / ED DIAGNOSES  Final diagnoses:  Migraine without status migrainosus, not intractable, unspecified migraine type  NEW MEDICATIONS STARTED DURING THIS VISIT:  New Prescriptions   No medications on file     Note:  This document was prepared using Dragon voice recognition software and may include unintentional dictation errors.    Arta Silence, MD 09/21/17 2100

## 2017-09-21 NOTE — ED Triage Notes (Signed)
Altered mental status since 1400 today. Pt lethargic.

## 2017-09-21 NOTE — Discharge Instructions (Addendum)
Follow-up with your regular doctor within the next 1 to 2 weeks.  Return to the ER immediately for new, worsening, or recurrent facial droop, change in mental status, confusion, severe headache, vision changes, weakness, or any other new or worsening symptoms that concern you.

## 2017-09-21 NOTE — ED Notes (Signed)
Pt is Alert and Oriented to time, Place and Self, but has been not able to answer some questions by husband appropriately. Husband states that when this nurse has been out of the room the patient has no recollection of driving to hospital and why she came here. Md notified.

## 2017-09-26 ENCOUNTER — Inpatient Hospital Stay: Admission: RE | Admit: 2017-09-26 | Payer: BC Managed Care – PPO | Source: Ambulatory Visit

## 2017-09-27 ENCOUNTER — Encounter: Payer: Self-pay | Admitting: Family Medicine

## 2017-09-27 ENCOUNTER — Ambulatory Visit: Payer: BC Managed Care – PPO | Admitting: Family Medicine

## 2017-09-27 VITALS — BP 109/65 | HR 54 | Temp 98.3°F | Resp 16

## 2017-09-27 DIAGNOSIS — G43409 Hemiplegic migraine, not intractable, without status migrainosus: Secondary | ICD-10-CM

## 2017-09-27 DIAGNOSIS — G43411 Hemiplegic migraine, intractable, with status migrainosus: Secondary | ICD-10-CM

## 2017-09-27 MED ORDER — HYDROCODONE-ACETAMINOPHEN 5-325 MG PO TABS: 1.0000 | ORAL_TABLET | Freq: Four times a day (QID) | ORAL | 0 refills | Status: DC | PRN

## 2017-09-27 MED ORDER — PROMETHAZINE HCL 25 MG/ML IJ SOLN
25.0000 mg | Freq: Once | INTRAMUSCULAR | Status: AC
  Administered 2017-09-27: 25 mg via INTRAMUSCULAR

## 2017-09-27 MED ORDER — DIPHENHYDRAMINE HCL 25 MG PO CAPS
25.0000 mg | ORAL_CAPSULE | Freq: Once | ORAL | Status: AC
  Administered 2017-09-27: 25 mg via ORAL

## 2017-09-27 MED ORDER — KETOROLAC TROMETHAMINE 30 MG/ML IJ SOLN
30.0000 mg | Freq: Once | INTRAMUSCULAR | Status: AC
  Administered 2017-09-27: 30 mg via INTRAMUSCULAR

## 2017-09-27 NOTE — Progress Notes (Signed)
[name] Female    DOB: [birth year]   62 y.o.   MRN: [id number] Visit Date: 09/27/2017  Today's Provider: Lelon Huh, MD   Chief Complaint  Patient presents with  . Migraine   Subjective:    Headache   This is a recurrent problem. The current episode started today. The pain radiates to the face. Associated symptoms include blurred vision, dizziness, eye pain, nausea, phonophobia, photophobia, scalp tenderness, tingling, a visual change and weakness. Pertinent negatives include no abdominal pain, abnormal behavior, anorexia, back pain, coughing, drainage, ear pain, eye redness, eye watering, facial sweating, fever, hearing loss, insomnia, loss of balance, muscle aches, neck pain, numbness, rhinorrhea, seizures, sinus pressure, sore throat, swollen glands, tinnitus, vomiting or weight loss. The symptoms are aggravated by bright light and work. She has tried darkened room for the symptoms. Her past medical history is significant for migraine headaches.    Patient is here with severe migraine. Patient has symptoms of facial drawing (right side), photophobia, phonophobia, nausea, weakness in arms and blurred vision. Patient was seen in  ER last week with same type of migraine. In ER patient had labs, CT scan, all showing normal. Patient states this morning while driving migraine came on and patient had to drive back home. Headache is identical to several previous headaches she has had this year. She took a [facility] and 1/2 of a hydrocodone/apap with minimal relief. She reports many year history of migraine and past evaluation by neurologist. Headaches had been under better control until she started a new job in November 2018, which she anticipates leaving next week.   Allergies  Allergen Reactions  . Diazepam Other (See Comments)    Other reaction(s): Hallucination Patient states "it makes me crazy" REACTION: Behavioral problems  . Ciprofloxacin Other (See Comments)   Other reaction(s): Unknown Loopy  . Codeine Hives and Itching    REACTION: Dizziness  . Morphine Hives    Other reaction(s): Unknown  . Niacin And Related Other (See Comments)    flushing  . Toradol [Ketorolac Tromethamine] Itching    Tolerates when taken with Benadryl  . Iodinated Diagnostic Agents Hives    Pt states she had a CT Head with contrast in the 90's and broke out in hives after the injection. SPM     Current Outpatient Medications:  .  ALPRAZolam (XANAX) 0.5 MG tablet, Take 1 tablet (0.5 mg total) by mouth 3 (three) times daily as needed for anxiety., Disp: 90 tablet, Rfl: 0 .  aspirin EC 81 MG tablet, Take 81 mg by mouth daily., Disp: , Rfl:  .  aspirin-acetaminophen-caffeine (EXCEDRIN MIGRAINE) 250-250-65 MG tablet, Take 1 tablet by mouth every 6 (six) hours as needed for migraine., Disp: , Rfl:  .  baclofen (LIORESAL) 10 MG tablet, TAKE 1 TABLET (10 MG TOTAL) BY MOUTH 2 (TWO) TIMES DAILY, Disp: , Rfl: 2 .  budesonide-formoterol (SYMBICORT) 160-4.5 MCG/ACT inhaler, Inhale 1 puff into the lungs 2 (two) times daily., Disp: 1 Inhaler, Rfl: 0 .  gabapentin (NEURONTIN) 100 MG capsule, Take 1 capsule (100 mg total) by mouth 3 (three) times daily., Disp: 90 capsule, Rfl: 3 .  gabapentin (NEURONTIN) 300 MG capsule, Take 1 capsule (300 mg total) by mouth at bedtime., Disp: 30 capsule, Rfl: 0 .  HYDROcodone-acetaminophen (NORCO/VICODIN) 5-325 MG tablet, Take 1 tablet by mouth every 6 (six) hours as needed. For pain, Disp: 30 tablet, Rfl: 0 .  loratadine (CLARITIN) 10 MG tablet, Take 10  mg by mouth 2 (two) times daily., Disp: , Rfl:  .  pantoprazole (PROTONIX) 40 MG tablet, TAKE 1 TABLET BY MOUTH ONCE DAILY, Disp: 90 tablet, Rfl: 1 .  PROAIR HFA 108 (90 Base) MCG/ACT inhaler, INHALE 2 PUFFS INTO THE LUNGS EVERY 6 HOURS AS NEEDED FOR WHEEZING ORSHORTNESS OF BREATH, Disp: 8.5 g, Rfl: 3 .  Probiotic Product (PROBIOTIC-10) CHEW, Chew 2 tablets by mouth daily., Disp: , Rfl:  .  promethazine  (PHENERGAN) 25 MG tablet, Take 1 tablet (25 mg total) by mouth every 8 (eight) hours as needed for nausea or vomiting., Disp: 30 tablet, Rfl: 0 .  QUEtiapine (SEROQUEL) 50 MG tablet, Take 1 tablet (50 mg total) by mouth at bedtime. (Patient taking differently: Take 75 mg by mouth at bedtime. Takes 1.5 tablets), Disp: 90 tablet, Rfl: 3 .  simvastatin (ZOCOR) 40 MG tablet, TAKE 1 TABLET BY MOUTH AT BEDTIME, Disp: 30 tablet, Rfl: 5 .  Specialty Vitamins Products (BIOTIN PLUS KERATIN) 10000-100 MCG-MG TABS, Take 1 tablet by mouth daily., Disp: , Rfl:  .  methylPREDNISolone (MEDROL) 4 MG TBPK tablet, 6 day taper; take as directed on package instructions (Patient not taking: Reported on 09/27/2017), Disp: 21 tablet, Rfl: 0 .  metoprolol tartrate (LOPRESSOR) 25 MG tablet, Take 25 mg by mouth 2 (two) times daily. , Disp: , Rfl:   Review of Systems  Constitutional: Negative for appetite change, chills, fatigue, fever and weight loss.  HENT: Negative for ear pain, hearing loss, rhinorrhea, sinus pressure, sore throat and tinnitus.   Eyes: Positive for blurred vision, photophobia and pain. Negative for redness.  Respiratory: Negative for cough, chest tightness and shortness of breath.   Cardiovascular: Negative for chest pain and palpitations.  Gastrointestinal: Positive for nausea. Negative for abdominal pain, anorexia and vomiting.  Musculoskeletal: Negative for back pain and neck pain.  Neurological: Positive for dizziness, tingling, weakness and headaches. Negative for seizures, numbness and loss of balance.  Psychiatric/Behavioral: The patient does not have insomnia.     Social History   Tobacco Use  . Smoking status: Current Every Day Smoker    Packs/day: 0.50    Years: 30.00    Pack years: 15.00    Types: Cigarettes  . Smokeless tobacco: Never Used  Substance Use Topics  . Alcohol use: Not Currently    Alcohol/week: 0.0 standard drinks   Objective:   BP 109/65 (BP Location: Right Arm,  Patient Position: Lying right side, Cuff Size: Normal)   Pulse (!) 54   Temp 98.3 F (36.8 C) (Oral)   Resp 16   SpO2 (!) 54%  Vitals:   09/27/17 1138  BP: 109/65  Pulse: (!) 54  Resp: 16  Temp: 98.3 F (36.8 C)  TempSrc: Oral  SpO2: (!) 54%     Physical Exam  Constitutional: She appears well-developed and well-nourished. Crying due to headache.  Neck: Normal range of motion. Neck supple. No JVD present. No tracheal deviation present. No thyromegaly present.  Cardiovascular: Normal rate, regular rhythm and normal heart sounds Pulmonary/Chest: Effort normal and breath sounds normal. No respiratory distress. She has no wheezes. She has no rales.  Neurological: Left side facial droop No other focal neurologic deficits.  Skin: She is not diaphoretic    Assessment & Plan:     1. Hemiplegic migraine without status migrainosus, not intractable Identical to previous migraines which she reports responded well to phenergan and ketorolac injections. She sometimes develops itching after receiving ketorolac, which has been managed with oral  diphenhydramine which was also given today.  - diphenhydrAMINE (BENADRYL) capsule 25 mg - ketorolac (TORADOL) 30 MG/ML injection 30 mg - promethazine (PHENERGAN) injection 25 mg  2. Intractable hemiplegic migraine with status migrainosus refill- HYDROcodone-acetaminophen (NORCO/VICODIN) 5-325 MG tablet; Take 1 tablet by mouth every 6 (six) hours as needed. For pain  Dispense: 30 tablet; Refill: 0       Lelon Huh, MD  [facility]

## 2017-10-05 ENCOUNTER — Telehealth: Payer: Self-pay

## 2017-10-05 NOTE — Telephone Encounter (Signed)
Patient reports symptoms of hypotension and bradycardia. She states her BP readings are averaging 80's/40-50' s and pulse rate is 40-50' s. She states she feels like "a CD spinning in the sun" and had 3 migraines the past month. Patient states she has seen Dr. Saralyn Pilar, Dr. Caryn Section and went to the ER for symptoms. Patient requested appointment on 10/07/17. Discussed with Dr. Brita Romp. Patient advised to stop Metoprolol and if she feel like near syncope, or has a episode of syncope to go to the ER for evaluation. Patient verba;ized understanding.

## 2017-10-06 NOTE — Progress Notes (Signed)
[name] Female    DOB: 06/23/1955   62 y.o.   MRN: [id number] Visit Date: 10/07/2017  Today's Provider: Lavon Paganini, MD   Chief Complaint  Patient presents with  . Hypotension  . Bradycardia  . Headache   Subjective:    I, Tiburcio Pea, CMA, am acting as a scribe for Lavon Paganini, MD.   HPI Patient presents today C/O hypotension, bradycardia and headaches. She states her pulse has been averaging 40-50' s and BP readings are between 19' s/40-50' s. Patient reports she stopped taking Metoprolol on 10/05/2017.  Today, her BP reading was 106/68 and pulse 99 at 7:00 am and at 8:20 am BP was 127/71  and pulse 99.  She also states she had three migraines the past month. These are stronger than previous migraines but similar distribution.  Unilateral L side of face, photophobia/phonophobia, N/V, and left facial droop.  She also has frequent aura with bright flashing lights.  She is tried triptan's in the past but they did not seem to help.  Migraine cocktail (Toradol, Phenergan, Benadryl ) doesn't seem to last long enough for migraine to totally go away as she doesn't sleep long enough for them to go away entirely.  The 3 episodes of migraine, are actually part of the same migraine as it has never fully gone away.  She does frequently take Excedrin migraine medication and BC powders.  She knows that she gets a rebound headache from frequent use of these.  Patient has been seen by Sonia Baller on 09/02/2017, Dr. Caryn Section on 09/27/2017 and at the ER on 09/21/2017 for same symptoms.  She was given migraine cocktails I did both Sonia Baller and Dr. Caryn Section.  She had a negative head CT in the ER.  She was given IV fluids and some IV antiemetic and pain medication.  She has been seen by Dr. Brigitte Pulse, neurology, for bilateral carpal tunnel syndrome in the past.  She has never discussed her migraines with him.  She states she tried amitriptyline with her PCP in the past for migraine prevention and did  not feel like this worked at all.  She is also concerned about bilateral lower extremity edema.  This gets worse through the day.  She states her stands a lot at work.  She is not using any compression stockings.  Edema goes down overnight.  This is gotten worse over the last few months.     Allergies  Allergen Reactions  . Diazepam Other (See Comments)    Other reaction(s): Hallucination Patient states "it makes me crazy" REACTION: Behavioral problems  . Ciprofloxacin Other (See Comments)    Other reaction(s): Unknown Loopy  . Codeine Hives and Itching    REACTION: Dizziness  . Morphine Hives    Other reaction(s): Unknown  . Niacin And Related Other (See Comments)    flushing  . Toradol [Ketorolac Tromethamine] Itching    Tolerates when taken with Benadryl  . Iodinated Diagnostic Agents Hives    Pt states she had a CT Head with contrast in the 90's and broke out in hives after the injection. SPM     Current Outpatient Medications:  .  ALPRAZolam (XANAX) 0.5 MG tablet, Take 1 tablet (0.5 mg total) by mouth 3 (three) times daily as needed for anxiety., Disp: 90 tablet, Rfl: 0 .  aspirin EC 81 MG tablet, Take 81 mg by mouth daily., Disp: , Rfl:  .  aspirin-acetaminophen-caffeine (EXCEDRIN MIGRAINE) 250-250-65 MG tablet, Take 1 tablet by  mouth every 6 (six) hours as needed for migraine., Disp: , Rfl:  .  baclofen (LIORESAL) 10 MG tablet, TAKE 1 TABLET (10 MG TOTAL) BY MOUTH 2 (TWO) TIMES DAILY, Disp: , Rfl: 2 .  budesonide-formoterol (SYMBICORT) 160-4.5 MCG/ACT inhaler, Inhale 1 puff into the lungs 2 (two) times daily., Disp: 1 Inhaler, Rfl: 0 .  gabapentin (NEURONTIN) 100 MG capsule, Take 1 capsule (100 mg total) by mouth 3 (three) times daily., Disp: 90 capsule, Rfl: 3 .  gabapentin (NEURONTIN) 300 MG capsule, Take 1 capsule (300 mg total) by mouth at bedtime., Disp: 30 capsule, Rfl: 0 .  HYDROcodone-acetaminophen (NORCO/VICODIN) 5-325 MG tablet, Take 1 tablet by mouth every 6  (six) hours as needed. For pain, Disp: 30 tablet, Rfl: 0 .  loratadine (CLARITIN) 10 MG tablet, Take 10 mg by mouth 2 (two) times daily., Disp: , Rfl:  .  pantoprazole (PROTONIX) 40 MG tablet, TAKE 1 TABLET BY MOUTH ONCE DAILY, Disp: 90 tablet, Rfl: 1 .  PROAIR HFA 108 (90 Base) MCG/ACT inhaler, INHALE 2 PUFFS INTO THE LUNGS EVERY 6 HOURS AS NEEDED FOR WHEEZING ORSHORTNESS OF BREATH, Disp: 8.5 g, Rfl: 3 .  Probiotic Product (PROBIOTIC-10) CHEW, Chew 2 tablets by mouth daily., Disp: , Rfl:  .  promethazine (PHENERGAN) 25 MG tablet, Take 1 tablet (25 mg total) by mouth every 8 (eight) hours as needed for nausea or vomiting., Disp: 30 tablet, Rfl: 0 .  QUEtiapine (SEROQUEL) 50 MG tablet, Take 1 tablet (50 mg total) by mouth at bedtime. (Patient taking differently: Take 75 mg by mouth at bedtime. Takes 1.5 tablets), Disp: 90 tablet, Rfl: 3 .  simvastatin (ZOCOR) 40 MG tablet, TAKE 1 TABLET BY MOUTH AT BEDTIME, Disp: 30 tablet, Rfl: 5 .  Specialty Vitamins Products (BIOTIN PLUS KERATIN) 10000-100 MCG-MG TABS, Take 1 tablet by mouth daily., Disp: , Rfl:  .  metoprolol tartrate (LOPRESSOR) 25 MG tablet, Take 25 mg by mouth 2 (two) times daily. , Disp: , Rfl:   Review of Systems  Constitutional: Negative.   HENT: Negative.   Eyes: Positive for photophobia and visual disturbance. Negative for pain, discharge, redness and itching.  Respiratory: Negative.   Cardiovascular: Negative.   Gastrointestinal: Positive for diarrhea, nausea and vomiting.  Endocrine: Negative.   Musculoskeletal: Negative.   Skin: Negative.   Neurological: Positive for dizziness, facial asymmetry and headaches. Negative for tremors, seizures, syncope, speech difficulty, weakness, light-headedness and numbness.  Psychiatric/Behavioral: Positive for sleep disturbance. Negative for agitation, behavioral problems, confusion, dysphoric mood, hallucinations, self-injury and suicidal ideas. The patient is not nervous/anxious and is not  hyperactive.     Social History   Tobacco Use  . Smoking status: Current Every Day Smoker    Packs/day: 0.50    Years: 30.00    Pack years: 15.00    Types: Cigarettes  . Smokeless tobacco: Never Used  Substance Use Topics  . Alcohol use: Not Currently    Alcohol/week: 0.0 standard drinks   Objective:   BP 112/78 (BP Location: Left Arm, Patient Position: Sitting, Cuff Size: Normal)   Pulse 66   Temp 98.4 F (36.9 C) (Oral)   Wt 118 lb 9.6 oz (53.8 kg)   SpO2 99%   BMI 23.16 kg/m  Vitals:   10/07/17 0948  BP: 112/78  Pulse: 66  Temp: 98.4 F (36.9 C)  TempSrc: Oral  SpO2: 99%  Weight: 118 lb 9.6 oz (53.8 kg)     Physical Exam  Constitutional: She is oriented to person,  place, and time. She appears well-developed and well-nourished. She does not appear ill. No distress.  HENT:  Head: Normocephalic and atraumatic.  Mouth/Throat: Oropharynx is clear and moist.  Eyes: Pupils are equal, round, and reactive to light. EOM are normal. No scleral icterus.  Neck: Neck supple. No JVD present. No neck rigidity.  Cardiovascular: Normal rate, regular rhythm, normal heart sounds and intact distal pulses.  No murmur heard. Pulmonary/Chest: Effort normal and breath sounds normal. No respiratory distress. She has no wheezes. She has no rales.  Abdominal: Soft. She exhibits no distension. There is no tenderness.  Musculoskeletal: She exhibits no edema.  Neurological: She is alert and oriented to person, place, and time. She displays no atrophy. No sensory deficit. She exhibits normal muscle tone. She displays a negative Romberg sign. Coordination and gait normal.  Strength 4/5 in LUE compared to RUE, 5/5 in bilateral lower extremities Left facial droop Cranial nerves otherwise intact  Skin: Skin is warm and dry. Capillary refill takes less than 2 seconds. No rash noted.  Psychiatric: She has a normal mood and affect. Her mood appears not anxious.  Vitals reviewed.         Assessment & Plan:   1. 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 Hypotension due to drugs 3. Bradycardia - now resolved - seems to have been 2/2 beta blocker, which she has discontinued.   - Vitals are appropriate today - continue to hold Metoprolol - reassess at next visit  4. Lower extremity edema - new problem - none present currently - likely related to venous insufficiency - discussed use of compression stockings - could consider [facility] referral in the future if persists despite compression stockings    Meds ordered this encounter  Medications  . topiramate (TOPAMAX) 50 MG tablet    Sig: Take 1 tablet (50 mg total) by mouth 2 (two) times daily.    Dispense:  60 tablet    Refill:  1  . predniSONE (DELTASONE) 10 MG tablet    Sig: Take 60mg  PO daily x2 days, then 50mg  daily x2d, then 40mg  daily x2 days, then 30 mg daily x2d, then 20mg  daily x2d, then 10mg  daily x2d    Dispense:  42 tablet    Refill:  0  . methylPREDNISolone acetate (DEPO-MEDROL) injection 80 mg     Return if symptoms worsen or fail to improve.   The entirety of the information documented in the History of Present Illness, Review of Systems and Physical Exam were personally obtained by me. Portions of this information were initially documented by Tiburcio Pea, CMA and reviewed by me for thoroughness and accuracy.    Virginia Crews, MD, MPH [facility] 10/07/2017 2:30  PM

## 2017-10-07 ENCOUNTER — Encounter: Payer: Self-pay | Admitting: Family Medicine

## 2017-10-07 ENCOUNTER — Ambulatory Visit: Payer: BC Managed Care – PPO | Admitting: Family Medicine

## 2017-10-07 VITALS — BP 112/78 | HR 66 | Temp 98.4°F | Wt 118.6 lb

## 2017-10-07 DIAGNOSIS — G43411 Hemiplegic migraine, intractable, with status migrainosus: Secondary | ICD-10-CM

## 2017-10-07 DIAGNOSIS — I952 Hypotension due to drugs: Secondary | ICD-10-CM

## 2017-10-07 DIAGNOSIS — R001 Bradycardia, unspecified: Secondary | ICD-10-CM | POA: Diagnosis not present

## 2017-10-07 DIAGNOSIS — R600 Localized edema: Secondary | ICD-10-CM

## 2017-10-07 MED ORDER — PREDNISONE 10 MG PO TABS: ORAL_TABLET | ORAL | 0 refills | Status: DC

## 2017-10-07 MED ORDER — TOPIRAMATE 50 MG PO TABS: 50.0000 mg | ORAL_TABLET | Freq: Two times a day (BID) | ORAL | 1 refills | Status: DC

## 2017-10-07 MED ORDER — METHYLPREDNISOLONE ACETATE 80 MG/ML IJ SUSP
80.0000 mg | Freq: Once | INTRAMUSCULAR | Status: AC
  Administered 2017-10-07: 80 mg via INTRAMUSCULAR

## 2017-10-07 NOTE — Patient Instructions (Signed)
Migraine Headache A migraine headache is an intense, throbbing pain on one side or both sides of the head. Migraines may also cause other symptoms, such as nausea, vomiting, and sensitivity to light and noise. What are the causes? Doing or taking certain things may also trigger migraines, such as:  Alcohol.  Smoking.  Medicines, such as: ? Medicine used to treat chest pain (nitroglycerine). ? Birth control pills. ? Estrogen pills. ? Certain blood pressure medicines.  Aged cheeses, chocolate, or caffeine.  Foods or drinks that contain nitrates, glutamate, aspartame, or tyramine.  Physical activity.  Other things that may trigger a migraine include:  Menstruation.  Pregnancy.  Hunger.  Stress, lack of sleep, too much sleep, or fatigue.  Weather changes.  What increases the risk? The following factors may make you more likely to experience migraine headaches:  Age. Risk increases with age.  Family history of migraine headaches.  Being Caucasian.  Depression and anxiety.  Obesity.  Being a woman.  Having a hole in the heart (patent foramen ovale) or other heart problems.  What are the signs or symptoms? The main symptom of this condition is pulsating or throbbing pain. Pain may:  Happen in any area of the head, such as on one side or both sides.  Interfere with daily activities.  Get worse with physical activity.  Get worse with exposure to bright lights or loud noises.  Other symptoms may include:  Nausea.  Vomiting.  Dizziness.  General sensitivity to bright lights, loud noises, or smells.  Before you get a migraine, you may get warning signs that a migraine is developing (aura). An aura may include:  Seeing flashing lights or having blind spots.  Seeing bright spots, halos, or zigzag lines.  Having tunnel vision or blurred vision.  Having numbness or a tingling feeling.  Having trouble talking.  Having muscle weakness.  How is this  diagnosed? A migraine headache can be diagnosed based on:  Your symptoms.  A physical exam.  Tests, such as CT scan or MRI of the head. These imaging tests can help rule out other causes of headaches.  Taking fluid from the spine (lumbar puncture) and analyzing it (cerebrospinal fluid analysis, or CSF analysis).  How is this treated? A migraine headache is usually treated with medicines that:  Relieve pain.  Relieve nausea.  Prevent migraines from coming back.  Treatment may also include:  Acupuncture.  Lifestyle changes like avoiding foods that trigger migraines.  Follow these instructions at home: Medicines  Take over-the-counter and prescription medicines only as told by your health care provider.  Do not drive or use heavy machinery while taking prescription pain medicine.  To prevent or treat constipation while you are taking prescription pain medicine, your health care provider may recommend that you: ? Drink enough fluid to keep your urine clear or pale yellow. ? Take over-the-counter or prescription medicines. ? Eat foods that are high in fiber, such as fresh fruits and vegetables, whole grains, and beans. ? Limit foods that are high in fat and processed sugars, such as fried and sweet foods. Lifestyle  Avoid alcohol use.  Do not use any products that contain nicotine or tobacco, such as cigarettes and e-cigarettes. If you need help quitting, ask your health care provider.  Get at least 8 hours of sleep every night.  Limit your stress. General instructions   Keep a journal to find out what may trigger your migraine headaches. For example, write down: ? What you eat and   drink. ? How much sleep you get. ? Any change to your diet or medicines.  If you have a migraine: ? Avoid things that make your symptoms worse, such as bright lights. ? It may help to lie down in a dark, quiet room. ? Do not drive or use heavy machinery. ? Ask your health care provider  what activities are safe for you while you are experiencing symptoms.  Keep all follow-up visits as told by your health care provider. This is important. Contact a health care provider if:  You develop symptoms that are different or more severe than your usual migraine symptoms. Get help right away if:  Your migraine becomes severe.  You have a fever.  You have a stiff neck.  You have vision loss.  Your muscles feel weak or like you cannot control them.  You start to lose your balance often.  You develop trouble walking.  You faint. This information is not intended to replace advice given to you by your health care provider. Make sure you discuss any questions you have with your health care provider. Document Released: 01/18/2005 Document Revised: 08/08/2015 Document Reviewed: 07/07/2015 Elsevier Interactive Patient Education  2017 [employer] Inc[employer]   

## 2017-10-11 ENCOUNTER — Ambulatory Visit
Admission: RE | Admit: 2017-10-11 | Discharge: 2017-10-11 | Disposition: A | Discharge status: Home / Self Care | Payer: BC Managed Care – PPO | Source: Ambulatory Visit | Attending: Family Medicine | Admitting: Family Medicine

## 2017-10-11 ENCOUNTER — Telehealth: Payer: Self-pay

## 2017-10-11 DIAGNOSIS — Z1231 Encounter for screening mammogram for malignant neoplasm of breast: Secondary | ICD-10-CM

## 2017-10-11 NOTE — Telephone Encounter (Signed)
Pt advised.   Thanks,   -Atalie Oros  

## 2017-10-11 NOTE — Telephone Encounter (Signed)
-----   Message from Margo Common, [place] sent at 10/11/2017 12:17 PM EDT ----- Normal mammogram without signs of malignancy. Repeat mammograms annually.

## 2017-11-01 ENCOUNTER — Ambulatory Visit: Payer: BC Managed Care – PPO | Admitting: Family Medicine

## 2017-11-01 ENCOUNTER — Encounter: Payer: Self-pay | Admitting: Family Medicine

## 2017-11-01 VITALS — BP 112/68 | HR 72 | Temp 98.4°F | Wt 114.0 lb

## 2017-11-01 DIAGNOSIS — R05 Cough: Secondary | ICD-10-CM | POA: Diagnosis not present

## 2017-11-01 DIAGNOSIS — G43411 Hemiplegic migraine, intractable, with status migrainosus: Secondary | ICD-10-CM

## 2017-11-01 DIAGNOSIS — R059 Cough, unspecified: Secondary | ICD-10-CM

## 2017-11-01 DIAGNOSIS — J0100 Acute maxillary sinusitis, unspecified: Secondary | ICD-10-CM

## 2017-11-01 MED ORDER — DM-GUAIFENESIN ER 30-600 MG PO TB12: 1.0000 | ORAL_TABLET | Freq: Two times a day (BID) | ORAL | 1 refills | Status: DC | PRN

## 2017-11-01 MED ORDER — ALBUTEROL SULFATE HFA 108 (90 BASE) MCG/ACT IN AERS: 2.0000 | INHALATION_SPRAY | Freq: Four times a day (QID) | RESPIRATORY_TRACT | 2 refills | Status: AC | PRN

## 2017-11-01 MED ORDER — AZITHROMYCIN 250 MG PO TABS: ORAL_TABLET | ORAL | 0 refills | Status: DC

## 2017-11-01 NOTE — Progress Notes (Signed)
[name] Female    DOB: 10/31/1955   62 y.o.   MRN: [id number] Visit Date: 11/01/2017  Today's Provider: Vernie Murders, PA   Chief Complaint  Patient presents with  . URI    Started about four days ago.   Subjective:    URI   This is a new problem. The current episode started in the past 7 days. There has been no fever. Associated symptoms include congestion, coughing, diarrhea, rhinorrhea and wheezing. Pertinent negatives include no abdominal pain, ear pain, headaches, nausea, plugged ear sensation, sinus pain, sneezing, sore throat, swollen glands or vomiting.      Past Medical History:  Diagnosis Date  . Adnexal mass   . Anxiety   . Barrett's esophagus   . Bell palsy    left foot also ,   . Bronchitis   . Complication of anesthesia    difficulty waking up  . Degenerative disc disease, cervical   . Depression   . Environmental and seasonal allergies   . GERD (gastroesophageal reflux disease)   . High triglycerides   . Migraines   . Migraines   . Rapid heart rate    Past Surgical History:  Procedure Laterality Date  . ABDOMINAL HYSTERECTOMY    . APPENDECTOMY    . CHOLECYSTECTOMY    . COLONOSCOPY WITH PROPOFOL N/A 07/16/2016   Procedure: COLONOSCOPY WITH PROPOFOL;  Surgeon: [name], MD;  Location: [facility] ENDOSCOPY;  Service: Endoscopy;  Laterality: N/A;  . ESOPHAGOGASTRODUODENOSCOPY (EGD) WITH PROPOFOL N/A 07/16/2016   Procedure: ESOPHAGOGASTRODUODENOSCOPY (EGD) WITH PROPOFOL;  Surgeon: [name], MD;  Location: [facility] ENDOSCOPY;  Service: Endoscopy;  Laterality: N/A;  . [place]  . LAPAROSCOPIC SALPINGO OOPHERECTOMY Bilateral 08/27/2016   Procedure: LAPAROSCOPIC SALPINGO OOPHORECTOMY;  Surgeon: Gae Dry, MD;  Location: [facility] ORS;  Service: Gynecology;  Laterality: Bilateral;  . TUBAL LIGATION  1983   Family History  Problem Relation Age of Onset  . Breast cancer Mother 34  . Skin cancer Mother   .  Hypothyroidism Mother   . Migraines Sister   . Colon cancer Father   . Lung cancer Father   . Heart disease Father   . Diabetes Paternal Grandmother   . Hypertension Paternal Grandmother   . Stroke Paternal Grandmother   . Dementia Paternal Grandfather   . Breast cancer Maternal Aunt        post men.  . Diabetes Paternal Uncle   . Breast cancer Cousin 32       maternal   . Multiple myeloma Cousin    Allergies  Allergen Reactions  . Diazepam Other (See Comments)    Other reaction(s): Hallucination Patient states "it makes me crazy" REACTION: Behavioral problems  . Ciprofloxacin Other (See Comments)    Other reaction(s): Unknown Loopy  . Codeine Hives and Itching    REACTION: Dizziness  . Morphine Hives    Other reaction(s): Unknown  . Niacin And Related Other (See Comments)    flushing  . Toradol [Ketorolac Tromethamine] Itching    Tolerates when taken with Benadryl  . Iodinated Diagnostic Agents Hives    Pt states she had a CT Head with contrast in the 90's and broke out in hives after the injection. SPM    Current Outpatient Medications:  .  ALPRAZolam (XANAX) 0.5 MG tablet, Take 1 tablet (0.5 mg total) by mouth 3 (three) times daily as needed for anxiety., Disp: 90 tablet, Rfl: 0 .  aspirin-acetaminophen-caffeine (EXCEDRIN MIGRAINE) 250-250-65 MG tablet, Take 1 tablet by mouth every 6 (six) hours as needed for migraine., Disp: , Rfl:  .  baclofen (LIORESAL) 10 MG tablet, TAKE 1 TABLET (10 MG TOTAL) BY MOUTH 2 (TWO) TIMES DAILY, Disp: , Rfl: 2 .  budesonide-formoterol (SYMBICORT) 160-4.5 MCG/ACT inhaler, Inhale 1 puff into the lungs 2 (two) times daily., Disp: 1 Inhaler, Rfl: 0 .  gabapentin (NEURONTIN) 100 MG capsule, Take 1 capsule (100 mg total) by mouth 3 (three) times daily., Disp: 90 capsule, Rfl: 3 .  gabapentin (NEURONTIN) 300 MG capsule, Take 1 capsule (300 mg total) by mouth at bedtime., Disp: 30 capsule, Rfl: 0 .  HYDROcodone-acetaminophen (NORCO/VICODIN) 5-325  MG tablet, Take 1 tablet by mouth every 6 (six) hours as needed. For pain, Disp: 30 tablet, Rfl: 0 .  loratadine (CLARITIN) 10 MG tablet, Take 10 mg by mouth 2 (two) times daily., Disp: , Rfl:  .  pantoprazole (PROTONIX) 40 MG tablet, TAKE 1 TABLET BY MOUTH ONCE DAILY, Disp: 90 tablet, Rfl: 1 .  PROAIR HFA 108 (90 Base) MCG/ACT inhaler, INHALE 2 PUFFS INTO THE LUNGS EVERY 6 HOURS AS NEEDED FOR WHEEZING ORSHORTNESS OF BREATH, Disp: 8.5 g, Rfl: 3 .  Probiotic Product (PROBIOTIC-10) CHEW, Chew 2 tablets by mouth daily., Disp: , Rfl:  .  promethazine (PHENERGAN) 25 MG tablet, Take 1 tablet (25 mg total) by mouth every 8 (eight) hours as needed for nausea or vomiting., Disp: 30 tablet, Rfl: 0 .  QUEtiapine (SEROQUEL) 50 MG tablet, Take 1 tablet (50 mg total) by mouth at bedtime. (Patient taking differently: Take 75 mg by mouth at bedtime. Takes 1.5 tablets), Disp: 90 tablet, Rfl: 3 .  simvastatin (ZOCOR) 40 MG tablet, TAKE 1 TABLET BY MOUTH AT BEDTIME, Disp: 30 tablet, Rfl: 5 .  Specialty Vitamins Products (BIOTIN PLUS KERATIN) 10000-100 MCG-MG TABS, Take 1 tablet by mouth daily., Disp: , Rfl:  .  topiramate (TOPAMAX) 50 MG tablet, Take 1 tablet (50 mg total) by mouth 2 (two) times daily., Disp: 60 tablet, Rfl: 1 .  aspirin EC 81 MG tablet, Take 81 mg by mouth daily., Disp: , Rfl:  .  metoprolol tartrate (LOPRESSOR) 25 MG tablet, Take 25 mg by mouth 2 (two) times daily. , Disp: , Rfl:  .  predniSONE (DELTASONE) 10 MG tablet, Take 54m PO daily x2 days, then 557mdaily x2d, then 4050maily x2 days, then 30 mg daily x2d, then 54m61mily x2d, then 10mg17mly x2d, Disp: 42 tablet, Rfl: 0  Review of Systems  HENT: Positive for congestion, rhinorrhea and voice change. Negative for ear discharge, ear pain, postnasal drip, sinus pressure, sinus pain, sneezing, sore throat, tinnitus and trouble swallowing.   Eyes: Negative.   Respiratory: Positive for cough, chest tightness, shortness of breath and wheezing.  Negative for apnea, choking and stridor.   Gastrointestinal: Positive for diarrhea. Negative for abdominal distention, abdominal pain, anal bleeding, blood in stool, constipation, nausea, rectal pain and vomiting.  Neurological: Negative for dizziness, light-headedness and headaches.   Social History   Tobacco Use  . Smoking status: Current Every Day Smoker    Packs/day: 0.50    Years: 30.00    Pack years: 15.00    Types: Cigarettes  . Smokeless tobacco: Never Used  Substance Use Topics  . Alcohol use: Not Currently    Alcohol/week: 0.0 standard drinks   Objective:   BP 112/68 (BP Location: Right Arm, Patient Position: Sitting, Cuff Size: Normal)   Pulse  72   Temp 98.4 F (36.9 C) (Oral)   Wt 114 lb (51.7 kg)   SpO2 97%   BMI 22.26 kg/m  Vitals:   11/01/17 1421  BP: 112/68  Pulse: 72  Temp: 98.4 F (36.9 C)  TempSrc: Oral  SpO2: 97%  Weight: 114 lb (51.7 kg)   Physical Exam  Constitutional: She is oriented to person, place, and time. She appears well-developed and well-nourished. No distress.  HENT:  Head: Normocephalic and atraumatic.  Right Ear: Hearing and external ear normal.  Left Ear: Hearing and external ear normal.  Nose: Nose normal.  Mouth/Throat: Oropharynx is clear and moist.  Slightly tender left maxillary sinus with decreased transillumination.  Eyes: Conjunctivae and lids are normal. Right eye exhibits no discharge. Left eye exhibits no discharge. No scleral icterus.  Cardiovascular: Normal rate and regular rhythm.  Pulmonary/Chest: Effort normal and breath sounds normal. No respiratory distress. She has no wheezes.  Musculoskeletal: Normal range of motion.  Neurological: She is alert and oriented to person, place, and time.  Skin: Skin is intact. No lesion and no rash noted.  Psychiatric: She has a normal mood and affect. Her speech is normal and behavior is normal. Thought content normal.      Assessment & Plan:     1. Subacute maxillary  sinusitis Onset 10-28-17 with rhinorrhea and progressed to having temp on 100 for a short time. Today having general malaise, left cheek tenderness and some laryngitis from PND. Will treat with Z-pak and warm saltwater gargles. May take Tylenol prn and drink extra fluids. Follow up as needed. - azithromycin (ZITHROMAX) 250 MG tablet; Take 2 tablets by mouth the first day, then take one daily for 4 days.  Dispense: 6 tablet; Refill: 0  2. Cough Onset the past 24-36 yours. No sputum production but had some fever with temp on 100 on 10-28-17. Some slight wheezing noticed over the weekend and she has started the Albuterol (but has run out of it). Will give Mucinex-DM to loosen congestion and control cough. Increase fluid intake and recheck if no better after antibiotic treatment for early subacute bronchitis with history of asthma. - albuterol (PROVENTIL HFA;VENTOLIN HFA) 108 (90 Base) MCG/ACT inhaler; Inhale 2 puffs into the lungs every 6 (six) hours as needed for wheezing or shortness of breath.  Dispense: 1 Inhaler; Refill: 2 - dextromethorphan-guaiFENesin (MUCINEX DM) 30-600 MG 12hr tablet; Take 1 tablet by mouth 2 (two) times daily as needed for cough.  Dispense: 20 tablet; Refill: 1  3. Intractable hemiplegic migraine with status migrainosus Followed by neurologist. Has started Aimovig injections and Topamax. Side effects of Topamax caused slow speech, difficulty with focus/concentration, feeling off balance and spontaneous crying spells. Advised she notify the neurologist of this issue. She decided to stop taking it. May need to consider a different medication to control migraines. No muscle weakness today.       Vernie Murders, PA  [facility]

## 2017-11-07 ENCOUNTER — Encounter: Payer: Self-pay | Admitting: Family Medicine

## 2017-11-07 ENCOUNTER — Ambulatory Visit: Payer: BC Managed Care – PPO | Admitting: Family Medicine

## 2017-11-07 ENCOUNTER — Other Ambulatory Visit: Payer: Self-pay

## 2017-11-07 ENCOUNTER — Ambulatory Visit
Admission: RE | Admit: 2017-11-07 | Discharge: 2017-11-07 | Disposition: A | Discharge status: Home / Self Care | Payer: BC Managed Care – PPO | Source: Ambulatory Visit | Attending: Family Medicine | Admitting: Family Medicine

## 2017-11-07 VITALS — BP 116/84 | HR 71 | Temp 97.9°F | Ht 61.0 in | Wt 112.4 lb

## 2017-11-07 DIAGNOSIS — H9213 Otorrhea, bilateral: Secondary | ICD-10-CM | POA: Diagnosis not present

## 2017-11-07 DIAGNOSIS — R0989 Other specified symptoms and signs involving the circulatory and respiratory systems: Secondary | ICD-10-CM

## 2017-11-07 DIAGNOSIS — J40 Bronchitis, not specified as acute or chronic: Secondary | ICD-10-CM

## 2017-11-07 DIAGNOSIS — J449 Chronic obstructive pulmonary disease, unspecified: Secondary | ICD-10-CM | POA: Insufficient documentation

## 2017-11-07 MED ORDER — CEFDINIR 300 MG PO CAPS: 300.0000 mg | ORAL_CAPSULE | Freq: Two times a day (BID) | ORAL | 0 refills | Status: DC

## 2017-11-07 MED ORDER — HYDROCODONE-HOMATROPINE 5-1.5 MG/5ML PO SYRP: 5.0000 mL | ORAL_SOLUTION | Freq: Three times a day (TID) | ORAL | 0 refills | Status: DC | PRN

## 2017-11-07 MED ORDER — PREDNISONE 5 MG PO TABS: ORAL_TABLET | ORAL | 0 refills | Status: DC

## 2017-11-07 NOTE — Progress Notes (Signed)
[name] Female    DOB: 10-24-1955   62 y.o.   MRN: [id number] Visit Date: 11/07/2017  Today's Provider: Vernie Murders, PA   Chief Complaint  Patient presents with  . URI    cough   Subjective:    HPI pt reports URI, with cough, runny nose, cold and hot symptoms and stayed in bed all day.  Low grade fever on and off.  symptoms started about 2 weeks ago on 11/01/17 and was treated with z-pak and mucinex.   Pt still having a bad cough during the night and doesn't seem to think the z-pak helped.    Past Medical History:  Diagnosis Date  . Adnexal mass   . Anxiety   . Barrett's esophagus   . Bell palsy    left foot also ,   . Bronchitis   . Complication of anesthesia    difficulty waking up  . Degenerative disc disease, cervical   . Depression   . Environmental and seasonal allergies   . GERD (gastroesophageal reflux disease)   . High triglycerides   . Migraines   . Migraines   . Rapid heart rate    Patient Active Problem List   Diagnosis Date Noted  . Shortness of breath 03/21/2017  . Premature ventricular contractions 10/21/2016  . Heart palpitations 10/05/2016  . Cyst of right ovary 08/18/2016  . Adnexal mass 08/17/2016  . Bilateral carpal tunnel syndrome 04/08/2016  . Bilateral hand pain 04/08/2016  . 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 Acute onset aura migraine 07/10/2014  . Bad memory 07/10/2014  . Migraine without aura 07/10/2014  . Abdominal pain, epigastric 07/10/2014  . Chronic constipation 07/10/2014  . Weakness of left side of body 11/28/2011  . Acute anxiety 11/20/2009  . DEPRESSION 11/20/2009  . Edema of extremities 11/20/2009  . Hypercholesterolemia without hypertriglyceridemia 06/18/2009  . 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 Malaise and fatigue 10/29/2008  . Abnormal loss of weight 10/29/2008  . 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  Affective bipolar disorder ([place]) 05/17/2008  . Barrett esophagus 05/17/2008   Past Surgical History:  Procedure Laterality Date  . ABDOMINAL HYSTERECTOMY    . APPENDECTOMY    . CHOLECYSTECTOMY    . COLONOSCOPY WITH PROPOFOL N/A 07/16/2016   Procedure: COLONOSCOPY WITH PROPOFOL;  Surgeon: [name], MD;  Location: [facility] ENDOSCOPY;  Service: Endoscopy;  Laterality: N/A;  . ESOPHAGOGASTRODUODENOSCOPY (EGD) WITH PROPOFOL N/A 07/16/2016   Procedure: ESOPHAGOGASTRODUODENOSCOPY (EGD) WITH PROPOFOL;  Surgeon: [name], MD;  Location: [facility] ENDOSCOPY;  Service: Endoscopy;  Laterality: N/A;  . [place]  . LAPAROSCOPIC SALPINGO OOPHERECTOMY Bilateral 08/27/2016   Procedure: LAPAROSCOPIC SALPINGO OOPHORECTOMY;  Surgeon: Gae Dry, MD;  Location: [facility] ORS;  Service: Gynecology;  Laterality: Bilateral;  . TUBAL LIGATION  1983   Family History  Problem Relation Age of Onset  . Breast cancer Mother 32  . Skin cancer Mother   . Hypothyroidism Mother   . Parkinson's disease Mother   . Migraines Sister   . Colon cancer Father   . Lung cancer Father   . Heart disease Father   . Diabetes Paternal Grandmother   . Hypertension Paternal Grandmother   . Stroke Paternal Grandmother   . Dementia Paternal Grandfather   . Breast cancer Maternal Aunt        post men.  . Diabetes Paternal Uncle   .  Breast cancer Cousin 51       maternal   . Multiple myeloma Cousin    Allergies  Allergen Reactions  . Diazepam Other (See Comments)    Other reaction(s): Hallucination Patient states "it makes me crazy" REACTION: Behavioral problems  . Ciprofloxacin Other (See Comments)    Other reaction(s): Unknown Loopy  . Codeine Hives and Itching    REACTION: Dizziness  . Morphine Hives    Other reaction(s): Unknown  . Niacin And Related Other (See Comments)    flushing  . Toradol [Ketorolac Tromethamine] Itching    Tolerates when taken with Benadryl  . Iodinated Diagnostic Agents Hives     Pt states she had a CT Head with contrast in the 90's and broke out in hives after the injection. SPM    Current Outpatient Medications:  .  albuterol (PROVENTIL HFA;VENTOLIN HFA) 108 (90 Base) MCG/ACT inhaler, Inhale 2 puffs into the lungs every 6 (six) hours as needed for wheezing or shortness of breath., Disp: 1 Inhaler, Rfl: 2 .  ALPRAZolam (XANAX) 0.5 MG tablet, Take 1 tablet (0.5 mg total) by mouth 3 (three) times daily as needed for anxiety., Disp: 90 tablet, Rfl: 0 .  baclofen (LIORESAL) 10 MG tablet, TAKE 1 TABLET (10 MG TOTAL) BY MOUTH 2 (TWO) TIMES DAILY, Disp: , Rfl: 2 .  budesonide-formoterol (SYMBICORT) 160-4.5 MCG/ACT inhaler, Inhale 1 puff into the lungs 2 (two) times daily., Disp: 1 Inhaler, Rfl: 0 .  gabapentin (NEURONTIN) 100 MG capsule, Take 1 capsule (100 mg total) by mouth 3 (three) times daily., Disp: 90 capsule, Rfl: 3 .  gabapentin (NEURONTIN) 300 MG capsule, Take 1 capsule (300 mg total) by mouth at bedtime., Disp: 30 capsule, Rfl: 0 .  HYDROcodone-acetaminophen (NORCO/VICODIN) 5-325 MG tablet, Take 1 tablet by mouth every 6 (six) hours as needed. For pain, Disp: 30 tablet, Rfl: 0 .  loratadine (CLARITIN) 10 MG tablet, Take 10 mg by mouth 2 (two) times daily., Disp: , Rfl:  .  pantoprazole (PROTONIX) 40 MG tablet, TAKE 1 TABLET BY MOUTH ONCE DAILY, Disp: 90 tablet, Rfl: 1 .  Probiotic Product (PROBIOTIC-10) CHEW, Chew 2 tablets by mouth daily., Disp: , Rfl:  .  promethazine (PHENERGAN) 25 MG tablet, Take 1 tablet (25 mg total) by mouth every 8 (eight) hours as needed for nausea or vomiting., Disp: 30 tablet, Rfl: 0 .  QUEtiapine (SEROQUEL) 50 MG tablet, Take 1 tablet (50 mg total) by mouth at bedtime. (Patient taking differently: Take 75 mg by mouth at bedtime. Takes 1.5 tablets), Disp: 90 tablet, Rfl: 3 .  simvastatin (ZOCOR) 40 MG tablet, TAKE 1 TABLET BY MOUTH AT BEDTIME, Disp: 30 tablet, Rfl: 5 .  Specialty Vitamins Products (BIOTIN PLUS KERATIN) 10000-100 MCG-MG  TABS, Take 1 tablet by mouth daily., Disp: , Rfl:  .  aspirin EC 81 MG tablet, Take 81 mg by mouth daily., Disp: , Rfl:  .  aspirin-acetaminophen-caffeine (EXCEDRIN MIGRAINE) 250-250-65 MG tablet, Take 1 tablet by mouth every 6 (six) hours as needed for migraine., Disp: , Rfl:  .  azithromycin (ZITHROMAX) 250 MG tablet, Take 2 tablets by mouth the first day, then take one daily for 4 days. (Patient not taking: Reported on 11/07/2017), Disp: 6 tablet, Rfl: 0 .  dextromethorphan-guaiFENesin (MUCINEX DM) 30-600 MG 12hr tablet, Take 1 tablet by mouth 2 (two) times daily as needed for cough. (Patient not taking: Reported on 11/07/2017), Disp: 20 tablet, Rfl: 1 .  metoprolol tartrate (LOPRESSOR) 25 MG tablet, Take 25  mg by mouth 2 (two) times daily. , Disp: , Rfl:  .  topiramate (TOPAMAX) 50 MG tablet, Take 1 tablet (50 mg total) by mouth 2 (two) times daily. (Patient not taking: Reported on 11/07/2017), Disp: 60 tablet, Rfl: 1  Review of Systems  Constitutional: Positive for fever.  HENT: Positive for ear discharge, postnasal drip, sinus pressure, sinus pain and sore throat.   Eyes: Negative.   Respiratory: Positive for cough and chest tightness.   Cardiovascular: Negative.   Gastrointestinal: Negative.   Genitourinary: Negative.   Musculoskeletal: Negative.   Skin: Negative.   Allergic/Immunologic: Negative.   Neurological: Negative.   Hematological: Negative.   Psychiatric/Behavioral: Negative.    Social History   Tobacco Use  . Smoking status: Current Every Day Smoker    Packs/day: 0.50    Years: 30.00    Pack years: 15.00    Types: Cigarettes  . Smokeless tobacco: Never Used  Substance Use Topics  . Alcohol use: Not Currently    Alcohol/week: 0.0 standard drinks   Objective:   BP 116/84 (BP Location: Right Arm, Patient Position: Sitting, Cuff Size: Normal)   Pulse 71   Temp 97.9 F (36.6 C) (Oral)   Ht 5' 1" (1.549 m)   Wt 112 lb 6.4 oz (51 kg)   SpO2 99%   BMI 21.24 kg/m    Vitals:   11/07/17 1059  BP: 116/84  Pulse: 71  Temp: 97.9 F (36.6 C)  TempSrc: Oral  SpO2: 99%  Weight: 112 lb 6.4 oz (51 kg)  Height: 5' 1" (1.549 m)   Physical Exam  Constitutional: She is oriented to person, place, and time. She appears well-developed and well-nourished. No distress.  HENT:  Head: Normocephalic and atraumatic.  Right Ear: Hearing normal.  Left Ear: Hearing normal.  Nose: Nose normal.  Mouth/Throat: Oropharynx is clear and moist.  Good transillumination of all sinuses today. No tenderness to palpation. Dry skin both ear canal entrances. TM's with good reflex and no perforations noted.  Eyes: Conjunctivae and lids are normal. Right eye exhibits no discharge. Left eye exhibits no discharge. No scleral icterus.  Neck: Neck supple.  Cardiovascular: Normal rate and regular rhythm.  Pulmonary/Chest: Effort normal. No respiratory distress. She has wheezes. She has rales.  Musculoskeletal: Normal range of motion.  Lymphadenopathy:    She has no cervical adenopathy.  Neurological: She is alert and oriented to person, place, and time.  Skin: Skin is intact. No lesion and no rash noted.  Psychiatric: She has a normal mood and affect. Her speech is normal and behavior is normal. Thought content normal.      Assessment & Plan:     1. Chest rales Having some low grade fever over the weekend. Finished the Z-pak on 11-04-17 and still coughing a lot at night with wheezing. Feel congested in chest without sinus congestion now. No significant relief from Mucinex and Albuterol inhaler. Will check CBC and CXR to rule out pneumonia. - CBC with Differential/Platelet - DG Chest 2 View  2. Bronchitis Cough worse at night. No fever today and occasional white to gray sputum with slight wheeze. Continues to use the Albuterol inhaler. Will check for pneumonia, treat with Omnicef and prednisone taper. Given Hycodan for cough and follow up pending CBC and CXR reports. - CBC with  Differential/Platelet - DG Chest 2 View - cefdinir (OMNICEF) 300 MG capsule; Take 1 capsule (300 mg total) by mouth 2 (two) times daily.  Dispense: 20 capsule; Refill: 0 -  HYDROcodone-homatropine (HYCODAN) 5-1.5 MG/5ML syrup; Take 5 mLs by mouth every 8 (eight) hours as needed for cough.  Dispense: 120 mL; Refill: 0 - predniSONE (DELTASONE) 5 MG tablet; Taper down by one tablet daily and start with 6 tablets by mouth (6,5,4,3,2,1)  Dispense: 21 tablet; Refill: 0  3. Otorrhea of both ears Intermittent pop then drainage from ears (usually the right first) at night about once every 2 weeks. Some dry skin flakes at the entrance to each canal. Offered ear drops but she feels that has not helped in the past for very long. Will give antibiotic a chance and consider referral to ENT if no better in 10 days.       Vernie Murders, PA  [facility]

## 2017-11-08 LAB — CBC WITH DIFFERENTIAL/PLATELET
Basophils Absolute: 0 x10E3/uL (ref 0.0–0.2)
Basos: 1 %
EOS (ABSOLUTE): 0.1 x10E3/uL (ref 0.0–0.4)
Eos: 1 %
Hematocrit: 36.8 % (ref 34.0–46.6)
Hemoglobin: 13.2 g/dL (ref 11.1–15.9)
Immature Grans (Abs): 0 x10E3/uL (ref 0.0–0.1)
Immature Granulocytes: 0 %
Lymphocytes Absolute: 2.6 x10E3/uL (ref 0.7–3.1)
Lymphs: 45 %
MCH: 33.2 pg — ABNORMAL HIGH (ref 26.6–33.0)
MCHC: 35.9 g/dL — ABNORMAL HIGH (ref 31.5–35.7)
MCV: 93 fL (ref 79–97)
Monocytes Absolute: 0.4 x10E3/uL (ref 0.1–0.9)
Monocytes: 8 %
Neutrophils Absolute: 2.5 x10E3/uL (ref 1.4–7.0)
Neutrophils: 45 %
Platelets: 239 x10E3/uL (ref 150–450)
RBC: 3.98 x10E6/uL (ref 3.77–5.28)
RDW: 12.8 % (ref 12.3–15.4)
WBC: 5.7 x10E3/uL (ref 3.4–10.8)

## 2017-12-04 DIAGNOSIS — J432 Centrilobular emphysema: Secondary | ICD-10-CM | POA: Insufficient documentation

## 2017-12-04 NOTE — Progress Notes (Signed)
Cardiology Office Note  Date:  12/05/2017   ID:  Maryruth, Apple 1955-09-18, MRN [id number]  PCP:  Margo Common, PA   Chief Complaint  Patient presents with  . OTHER    Second opinion c/o chest discomfort radiates to back/shoulder blades feel like she's being shocked/squeezing sensation and bilateral arm numbness/tingling. Meds reviewed verbally with pt.    HPI:  Ms. Doepke is a 62 y.o.female patient  With PMH of  shortness of breath, chest pressure, progressive peripheral edema, and palpitations hyperlipidemia,  COPD with ongoing tobacco abuse,  H/a, migraines palpitations. PVCs Who presents by self-referral for consultation of her arm pain, chest pain  Previously seen by Dr. 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 She started developing worsening peripheral edema   increased palpitations , possibly secondary to stress  progressive exertional shortness of breath Now no longer works here as of August 2019  In terms of her shortness of breath, marginal improvement with her inhaler use.   Chest x-ray revealed no evidence of edema or consolidation.   spirometry showed moderate airway obstruction with low vital capacity an estimated lung age of 17 years.   given Symbicort to try , did not seem to help  Significant work-up as detailed below, cures reviewed with her in detail 48 hour Holter monitor on 10/19/16 revealed  sinus rhythm  occasional premature ventricular contractions and premature atrial contractions.  stress echocardiogram 01/01/2016, exercised 10 minutes and 10 seconds on a Bruce protocol with chest discomfort, without diagnostic ECG changes.  2D echocardiogram revealed normal left ventricular function, with LV ejection fraction greater than 55%.   24-hour Holter monitor 03/28/2017 revealed predominant sinus bradycardia  with mean heart rate of 59 bpm, infrequent premature ventricular contractions and premature atrial contractions. There were 2 atrial runs, the longest lasting 4 beats.   2D echocardiogram 03/28/2017 revealed normal left ventricular function, with LVEF greater than 55%, with trivial valvular insufficiencies.  Unintentional weight loss CT chest pending  Set a quit date of 12/14/2017, for smoking  Biggest trouble now is a shock type electrical feeling she gets in her body Going on for years, started 5 years ago worse at night, not really notice it during the daytime Shock feeling hands, arms, chest Woke up 5 times last night, Shocking head,neck, arms hands Has to get up, moves her hands  Exhausted, h/a today Took a xanax, and she went back to sleep Reports she has had work-up with neurology including shots to her wrist for carpal tunnel  Sees Dr. Sharlet Salina for chronic neck pain Did shots in her neck  Sees Dr. Manuella Ghazi, neurology Does not feel like gabapentin is helping her nerve pain  Wonders if it could all be her heart  EKG personally reviewed by myself on todays visit Shows normal sinus rhythm rate 67 bpm no significant ST or T wave changes   PMH:   has a past medical history of Adnexal mass, Anxiety, Barrett's esophagus, Bell palsy, Bronchitis, Complication of anesthesia, Degenerative disc disease, cervical, Depression, Environmental and seasonal allergies, GERD (gastroesophageal reflux disease), Heart murmur, High triglycerides, Migraines, Migraines, and Rapid heart rate.  PSH:    Past Surgical History:  Procedure Laterality Date  . ABDOMINAL HYSTERECTOMY    . APPENDECTOMY    . CARDIAC CATHETERIZATION     [facility] pt doesn't have any stents or  recall years ago 20+ yrs ago  . CHOLECYSTECTOMY    . COLONOSCOPY WITH PROPOFOL N/A 07/16/2016   Procedure: COLONOSCOPY WITH PROPOFOL;  Surgeon: [name], MD;  Location: [facility] ENDOSCOPY;  Service: Endoscopy;  Laterality: N/A;  .  ESOPHAGOGASTRODUODENOSCOPY (EGD) WITH PROPOFOL N/A 07/16/2016   Procedure: ESOPHAGOGASTRODUODENOSCOPY (EGD) WITH PROPOFOL;  Surgeon: [name], MD;  Location: [facility] ENDOSCOPY;  Service: Endoscopy;  Laterality: N/A;  . [place]  . LAPAROSCOPIC SALPINGO OOPHERECTOMY Bilateral 08/27/2016   Procedure: LAPAROSCOPIC SALPINGO OOPHORECTOMY;  Surgeon: Gae Dry, MD;  Location: [facility] ORS;  Service: Gynecology;  Laterality: Bilateral;  . TUBAL LIGATION  1983    Current Outpatient Medications  Medication Sig Dispense Refill  . albuterol (PROVENTIL HFA;VENTOLIN HFA) 108 (90 Base) MCG/ACT inhaler Inhale 2 puffs into the lungs every 6 (six) hours as needed for wheezing or shortness of breath. 1 Inhaler 2  . ALPRAZolam (XANAX) 0.5 MG tablet Take 1 tablet (0.5 mg total) by mouth 3 (three) times daily as needed for anxiety. 90 tablet 0  . aspirin EC 81 MG tablet Take 81 mg by mouth daily.    . baclofen (LIORESAL) 10 MG tablet TAKE 1 TABLET (10 MG TOTAL) BY MOUTH 2 (TWO) TIMES DAILY  2  . budesonide-formoterol (SYMBICORT) 160-4.5 MCG/ACT inhaler Inhale 1 puff into the lungs 2 (two) times daily. 1 Inhaler 0  . gabapentin (NEURONTIN) 100 MG capsule Take 1 capsule (100 mg total) by mouth 3 (three) times daily. 90 capsule 3  . gabapentin (NEURONTIN) 300 MG capsule Take 1 capsule (300 mg total) by mouth at bedtime. 30 capsule 0  . HYDROcodone-acetaminophen (NORCO/VICODIN) 5-325 MG tablet Take 1 tablet by mouth every 6 (six) hours as needed. For pain 30 tablet 0  . HYDROcodone-homatropine (HYCODAN) 5-1.5 MG/5ML syrup Take 5 mLs by mouth every 8 (eight) hours as needed for cough. 120 mL 0  . loratadine (CLARITIN) 10 MG tablet Take 10 mg by mouth 2 (two) times daily.    . pantoprazole (PROTONIX) 40 MG tablet TAKE 1 TABLET BY MOUTH ONCE DAILY 90 tablet 1  . Probiotic Product (PROBIOTIC-10) CHEW Chew 2 tablets by mouth daily.    . promethazine (PHENERGAN) 25 MG tablet Take 1 tablet (25 mg total) by  mouth every 8 (eight) hours as needed for nausea or vomiting. 30 tablet 0  . QUEtiapine (SEROQUEL) 50 MG tablet Take 1 tablet (50 mg total) by mouth at bedtime. (Patient taking differently: Take 75 mg by mouth at bedtime. Takes 1.5 tablets) 90 tablet 3  . simvastatin (ZOCOR) 40 MG tablet TAKE 1 TABLET BY MOUTH AT BEDTIME 30 tablet 5  . Specialty Vitamins Products (BIOTIN PLUS KERATIN) 10000-100 MCG-MG TABS Take 1 tablet by mouth daily.     No current facility-administered medications for this visit.      Allergies:   Diazepam; Ciprofloxacin; Codeine; Metoprolol; Morphine; Niacin and related; Topiramate; Toradol [ketorolac tromethamine]; and Iodinated diagnostic agents   Social History:  The patient  reports that she has been smoking cigarettes. She has a 30.00 pack-year smoking history. She has never used smokeless tobacco. She reports that she drank alcohol. She reports that she does not use drugs.   Family History:   family history includes Breast cancer in her maternal aunt; Breast cancer (age of onset: 58) in her cousin; Breast cancer (age of onset: 37) in her mother; Colon cancer in her father; Dementia in her paternal grandfather; Diabetes in her paternal grandmother and paternal uncle; Heart disease in  her father; Hypertension in her paternal grandmother; Hypothyroidism in her mother; Lung cancer in her father; Migraines in her sister; Multiple myeloma in her cousin; Parkinson's disease in her mother; Skin cancer in her mother; Stroke in her paternal grandmother.    Review of Systems: Review of Systems  Constitutional: Negative.   Respiratory: Positive for shortness of breath.   Cardiovascular: Negative.   Gastrointestinal: Negative.   Musculoskeletal: Negative.   Neurological: Negative.        Electrical type shock feeling running up her arms, chest neck hands, feels like electric shock  Psychiatric/Behavioral: Negative.   All other systems reviewed and are negative.    PHYSICAL  EXAM: VS:  BP 108/67 (BP Location: Right Arm, Patient Position: Sitting, Cuff Size: Normal)   Pulse 67   Ht 5' (1.524 m)   Wt 113 lb 4 oz (51.4 kg)   BMI 22.12 kg/m  , BMI Body mass index is 22.12 kg/m. GEN: Well nourished, well developed, in no acute distress  HEENT: normal  Neck: no JVD, carotid bruits, or masses Cardiac: RRR; no murmurs, rubs, or gallops,no edema  Respiratory:  clear to auscultation bilaterally, normal work of breathing GI: soft, nontender, nondistended, + BS MS: no deformity or atrophy  Skin: warm and dry, no rash Neuro:  Strength and sensation are intact Psych: euthymic mood, full affect   Recent Labs: 09/21/2017: ALT 18; BUN 21; Creatinine, Ser 0.99; Magnesium 2.4; Potassium 3.4; Sodium 144 11/07/2017: Hemoglobin 13.2; Platelets 239    Lipid Panel No results found for: CHOL, HDL, LDLCALC, TRIG    Wt Readings from Last 3 Encounters:  12/05/17 113 lb 4 oz (51.4 kg)  11/07/17 112 lb 6.4 oz (51 kg)  11/01/17 114 lb (51.7 kg)       ASSESSMENT AND PLAN:  Centrilobular emphysema (HCC) - Plan: EKG 12-Lead Likely contributing to shortness of breath symptoms Previously tried on inhalers Recommended smoking cessation  Hypercholesterolemia without hypertriglyceridemia - Plan: Lipid Profile Lipid panel drawn today, no recent labs available  Premature ventricular contractions - Plan: EKG 12-Lead She does appreciate occasional palpitations No further work-up needed These were rare on previous Holter monitors  SOB (shortness of breath) Chronic issue, recommended smoking cessation Less likely ischemia  Anxiety and depression Managed by primary care On Seroquel  Pain in both upper extremities Etiology of the shocking" sensation that she gets in her hands, arms, neck, chest is likely noncardiac.  She does see neurology for this, previously treated for carpal tunnel but did not help.  [place] does not seem to help Recommended she consider talking with  neurology concerning alternate medications such as Cymbalta or Lyrica  Disposition:   F/U as needed   Total encounter time more than 45 minutes  Greater than 50% was spent in counseling and coordination of care with the patient    Orders Placed This Encounter  Procedures  . Lipid Profile  . EKG 12-Lead     Signed, Esmond Plants, M.D., Ph.D. 12/05/2017  [place], [place]

## 2017-12-05 ENCOUNTER — Encounter: Payer: Self-pay | Admitting: Cardiovascular Disease

## 2017-12-05 ENCOUNTER — Encounter

## 2017-12-05 ENCOUNTER — Ambulatory Visit (INDEPENDENT_AMBULATORY_CARE_PROVIDER_SITE_OTHER): Payer: BC Managed Care – PPO | Admitting: Cardiovascular Disease

## 2017-12-05 VITALS — BP 108/67 | HR 67 | Ht 60.0 in | Wt 113.2 lb

## 2017-12-05 DIAGNOSIS — E782 Mixed hyperlipidemia: Secondary | ICD-10-CM | POA: Diagnosis not present

## 2017-12-05 DIAGNOSIS — M79602 Pain in left arm: Secondary | ICD-10-CM

## 2017-12-05 DIAGNOSIS — J432 Centrilobular emphysema: Secondary | ICD-10-CM

## 2017-12-05 DIAGNOSIS — E7800 Pure hypercholesterolemia, unspecified: Secondary | ICD-10-CM | POA: Diagnosis not present

## 2017-12-05 DIAGNOSIS — M79603 Pain in arm, unspecified: Secondary | ICD-10-CM | POA: Insufficient documentation

## 2017-12-05 DIAGNOSIS — F419 Anxiety disorder, unspecified: Secondary | ICD-10-CM

## 2017-12-05 DIAGNOSIS — I493 Ventricular premature depolarization: Secondary | ICD-10-CM

## 2017-12-05 DIAGNOSIS — R0602 Shortness of breath: Secondary | ICD-10-CM

## 2017-12-05 DIAGNOSIS — M79601 Pain in right arm: Secondary | ICD-10-CM

## 2017-12-05 DIAGNOSIS — F329 Major depressive disorder, single episode, unspecified: Secondary | ICD-10-CM

## 2017-12-05 NOTE — Patient Instructions (Addendum)
Ask Neurology about cymbalta and or lyrica   Medication Instructions:  No changes  If you need a refill on your cardiac medications before your next appointment, please call your pharmacy.    Lab work: Lipid panel today   If you have labs (blood work) drawn today and your tests are completely normal, you will receive your results only by: Marland Kitchen MyChart Message (if you have MyChart) OR . A paper copy in the mail If you have any lab test that is abnormal or we need to change your treatment, we will call you to review the results.   Testing/Procedures: No new testing needed   Follow-Up: At [facility], you and your health needs are our priority.  As part of our continuing mission to provide you with exceptional heart care, we have created designated Provider Care Teams.  These Care Teams include your primary Cardiologist (physician) and Advanced Practice Providers (APPs -  Physician Assistants and Nurse Practitioners) who all work together to provide you with the care you need, when you need it.  . You will need a follow up appointment as needed   . Providers on your designated Care Team:   . Murray Hodgkins, NP . Christell Faith, PA-C . Marrianne Mood, PA-C  Any Other Special Instructions Will Be Listed Below (If Applicable).  For educational health videos Log in to : [email or web address] Or : [email or web address], password : [email or web address]

## 2017-12-06 LAB — LIPID PANEL
Chol/HDL Ratio: 2.4 ratio (ref 0.0–4.4)
Cholesterol, Total: 139 mg/dL (ref 100–199)
HDL: 59 mg/dL (ref >39)
LDL Calculated: 67 mg/dL (ref 0–99)
Triglycerides: 67 mg/dL (ref 0–149)
VLDL CHOLESTEROL CAL: 13 mg/dL (ref 5–40)

## 2017-12-09 ENCOUNTER — Telehealth: Payer: Self-pay | Admitting: Cardiovascular Disease

## 2017-12-09 NOTE — Telephone Encounter (Signed)
Patient calling to check the status of lipid lab results Please call to discuss, patient will be going to work soon Would like a message with the good number, the bad number, the triglyceride number and what to do about it

## 2017-12-09 NOTE — Telephone Encounter (Signed)
Reviewed preliminary results with patient and that once provider reviews if he wants to make any further recommendations we will give her a call back. She verbalized understanding with no further questions at this time.

## 2017-12-12 ENCOUNTER — Telehealth: Payer: Self-pay | Admitting: Family Medicine

## 2017-12-12 NOTE — Telephone Encounter (Signed)
Pt contacted office for refill request on the following medications:  nicotine (NICODERM CQ) 21 mg/24hr patch  [name]  Pt stated that she has been discussing with Simona Huh her trying to stop smoking and Adriana had prescribed these in the past. Pt is requesting Simona Huh send in new Rx. Please advise. Thanks TNP

## 2017-12-13 ENCOUNTER — Other Ambulatory Visit: Payer: Self-pay | Admitting: Family Medicine

## 2017-12-13 DIAGNOSIS — Z720 Tobacco use: Secondary | ICD-10-CM

## 2017-12-13 MED ORDER — NICOTINE 21 MG/24HR TD PT24: 21.0000 mg | MEDICATED_PATCH | Freq: Every day | TRANSDERMAL | 0 refills | Status: AC

## 2017-12-13 NOTE — Telephone Encounter (Signed)
Please review

## 2017-12-13 NOTE — Telephone Encounter (Signed)
Pt advised of not to smoke and use patch at same time.  dbs

## 2017-12-13 NOTE — Telephone Encounter (Signed)
Done. Remind her not to smoke and use patch at the same time - too much nicotine that way.

## 2017-12-14 ENCOUNTER — Telehealth: Payer: Self-pay

## 2017-12-14 NOTE — Telephone Encounter (Signed)
Elverta and left vm giving the rx for the Nicoderm patch.  D[facility] sent rx to pharmacy but they didn't receive it.  dbs

## 2017-12-14 NOTE — Telephone Encounter (Signed)
Pt stated [facility] didn't receive the Rx for nicotine (NICODERM CQ) 21 mg/24hr patch and pt is requesting it be resent today if possible. Please advise. Thanks TNP

## 2017-12-15 NOTE — Telephone Encounter (Signed)
Acknowledged. Thanks

## 2018-01-17 ENCOUNTER — Other Ambulatory Visit: Payer: Self-pay | Admitting: Neurology

## 2018-01-17 DIAGNOSIS — M5412 Radiculopathy, cervical region: Secondary | ICD-10-CM

## 2018-01-17 DIAGNOSIS — M542 Cervicalgia: Secondary | ICD-10-CM

## 2018-02-07 ENCOUNTER — Ambulatory Visit
Admission: RE | Admit: 2018-02-07 | Discharge: 2018-02-07 | Disposition: A | Discharge status: Home / Self Care | Payer: BC Managed Care – PPO | Source: Ambulatory Visit | Attending: Neurology | Admitting: Neurology

## 2018-02-07 DIAGNOSIS — M5412 Radiculopathy, cervical region: Secondary | ICD-10-CM | POA: Insufficient documentation

## 2018-02-07 DIAGNOSIS — M542 Cervicalgia: Secondary | ICD-10-CM | POA: Diagnosis present

## 2018-02-08 ENCOUNTER — Ambulatory Visit: Admission: RE | Admit: 2018-02-08 | Payer: BC Managed Care – PPO | Source: Ambulatory Visit

## 2018-02-08 ENCOUNTER — Ambulatory Visit: Payer: BC Managed Care – PPO

## 2018-02-24 ENCOUNTER — Inpatient Hospital Stay: Payer: BC Managed Care – PPO | Admitting: Oncology

## 2018-03-01 ENCOUNTER — Other Ambulatory Visit: Payer: Self-pay | Admitting: Physician Assistant

## 2018-03-13 ENCOUNTER — Ambulatory Visit: Payer: BC Managed Care – PPO | Admitting: Physician Assistant

## 2018-03-13 ENCOUNTER — Encounter: Payer: Self-pay | Admitting: Physician Assistant

## 2018-03-13 VITALS — BP 112/69 | HR 57 | Temp 98.4°F | Wt 104.6 lb

## 2018-03-13 DIAGNOSIS — J0140 Acute pansinusitis, unspecified: Secondary | ICD-10-CM

## 2018-03-13 DIAGNOSIS — J40 Bronchitis, not specified as acute or chronic: Secondary | ICD-10-CM

## 2018-03-13 MED ORDER — DOXYCYCLINE HYCLATE 100 MG PO TABS: 100.0000 mg | ORAL_TABLET | Freq: Two times a day (BID) | ORAL | 0 refills | Status: DC

## 2018-03-13 MED ORDER — PREDNISONE 10 MG (21) PO TBPK: ORAL_TABLET | ORAL | 0 refills | Status: DC

## 2018-03-13 MED ORDER — HYDROCODONE-HOMATROPINE 5-1.5 MG/5ML PO SYRP: 5.0000 mL | ORAL_SOLUTION | Freq: Three times a day (TID) | ORAL | 0 refills | Status: DC | PRN

## 2018-03-13 NOTE — Patient Instructions (Signed)
Sinusitis, Adult  Sinusitis is inflammation of your sinuses. Sinuses are hollow spaces in the bones around your face. Your sinuses are located:   Around your eyes.   In the middle of your forehead.   Behind your nose.   In your cheekbones.  Mucus normally drains out of your sinuses. When your nasal tissues become inflamed or swollen, mucus can become trapped or blocked. This allows bacteria, viruses, and fungi to grow, which leads to infection. Most infections of the sinuses are caused by a virus.  Sinusitis can develop quickly. It can last for up to 4 weeks (acute) or for more than 12 weeks (chronic). Sinusitis often develops after a cold.  What are the causes?  This condition is caused by anything that creates swelling in the sinuses or stops mucus from draining. This includes:   Allergies.   Asthma.   Infection from bacteria or viruses.   Deformities or blockages in your nose or sinuses.   Abnormal growths in the nose (nasal polyps).   Pollutants, such as chemicals or irritants in the air.   Infection from fungi (rare).  What increases the risk?  You are more likely to develop this condition if you:   Have a weak body defense system (immune system).   Do a lot of swimming or diving.   Overuse nasal sprays.   Smoke.  What are the signs or symptoms?  The main symptoms of this condition are pain and a feeling of pressure around the affected sinuses. Other symptoms include:   Stuffy nose or congestion.   Thick drainage from your nose.   Swelling and warmth over the affected sinuses.   Headache.   Upper toothache.   A cough that may get worse at night.   Extra mucus that collects in the throat or the back of the nose (postnasal drip).   Decreased sense of smell and taste.   Fatigue.   A fever.   Sore throat.   Bad breath.  How is this diagnosed?  This condition is diagnosed based on:   Your symptoms.   Your medical history.   A physical exam.   Tests to find out if your condition is  acute or chronic. This may include:  ? Checking your nose for nasal polyps.  ? Viewing your sinuses using a device that has a light (endoscope).  ? Testing for allergies or bacteria.  ? Imaging tests, such as an MRI or CT scan.  In rare cases, a bone biopsy may be done to rule out more serious types of fungal sinus disease.  How is this treated?  Treatment for sinusitis depends on the cause and whether your condition is chronic or acute.   If caused by a virus, your symptoms should go away on their own within 10 days. You may be given medicines to relieve symptoms. They include:  ? Medicines that shrink swollen nasal passages (topical intranasal decongestants).  ? Medicines that treat allergies (antihistamines).  ? A spray that eases inflammation of the nostrils (topical intranasal corticosteroids).  ? Rinses that help get rid of thick mucus in your nose (nasal saline washes).   If caused by bacteria, your health care provider may recommend waiting to see if your symptoms improve. Most bacterial infections will get better without antibiotic medicine. You may be given antibiotics if you have:  ? A severe infection.  ? A weak immune system.   If caused by narrow nasal passages or nasal polyps, you may need   to have surgery.  Follow these instructions at home:  Medicines   Take, use, or apply over-the-counter and prescription medicines only as told by your health care provider. These may include nasal sprays.   If you were prescribed an antibiotic medicine, take it as told by your health care provider. Do not stop taking the antibiotic even if you start to feel better.  Hydrate and humidify     Drink enough fluid to keep your urine pale yellow. Staying hydrated will help to thin your mucus.   Use a cool mist humidifier to keep the humidity level in your home above 50%.   Inhale steam for 10-15 minutes, 3-4 times a day, or as told by your health care provider. You can do this in the bathroom while a hot shower is  running.   Limit your exposure to cool or dry air.  Rest   Rest as much as possible.   Sleep with your head raised (elevated).   Make sure you get enough sleep each night.  General instructions     Apply a warm, moist washcloth to your face 3-4 times a day or as told by your health care provider. This will help with discomfort.   Wash your hands often with soap and water to reduce your exposure to germs. If soap and water are not available, use hand sanitizer.   Do not smoke. Avoid being around people who are smoking (secondhand smoke).   Keep all follow-up visits as told by your health care provider. This is important.  Contact a health care provider if:   You have a fever.   Your symptoms get worse.   Your symptoms do not improve within 10 days.  Get help right away if:   You have a severe headache.   You have persistent vomiting.   You have severe pain or swelling around your face or eyes.   You have vision problems.   You develop confusion.   Your neck is stiff.   You have trouble breathing.  Summary   Sinusitis is soreness and inflammation of your sinuses. Sinuses are hollow spaces in the bones around your face.   This condition is caused by nasal tissues that become inflamed or swollen. The swelling traps or blocks the flow of mucus. This allows bacteria, viruses, and fungi to grow, which leads to infection.   If you were prescribed an antibiotic medicine, take it as told by your health care provider. Do not stop taking the antibiotic even if you start to feel better.   Keep all follow-up visits as told by your health care provider. This is important.  This information is not intended to replace advice given to you by your health care provider. Make sure you discuss any questions you have with your health care provider.  Document Released: 01/18/2005 Document Revised: 06/20/2017 Document Reviewed: 06/20/2017  Elsevier Interactive Patient Education  2019 [employer]

## 2018-03-13 NOTE — Progress Notes (Signed)
[name] Female    DOB: 04/17/1955   63 y.o.   MRN: [id number] Visit Date: 03/13/2018  Today's Provider: Mar Daring, PA-C   Chief Complaint  Patient presents with  . URI   Subjective:     URI   The current episode started in the past 7 days. The problem has been gradually worsening. There has been no fever. Associated symptoms include congestion, coughing, sinus pain, sneezing, a sore throat and wheezing.    Allergies  Allergen Reactions  . Diazepam Other (See Comments)    Other reaction(s): Hallucination Patient states "it makes me crazy" REACTION: Behavioral problems  . Ciprofloxacin Other (See Comments)    Other reaction(s): Unknown Loopy  . Codeine Hives and Itching    REACTION: Dizziness  . Metoprolol     Migraine/headaches   . Morphine Hives    Other reaction(s): Unknown  . Niacin And Related Other (See Comments)    flushing  . Topiramate Other (See Comments)  . Toradol [Ketorolac Tromethamine] Itching    Tolerates when taken with Benadryl  . Iodinated Diagnostic Agents Hives    Pt states she had a CT Head with contrast in the 90's and broke out in hives after the injection. SPM     Current Outpatient Medications:  .  albuterol (PROVENTIL HFA;VENTOLIN HFA) 108 (90 Base) MCG/ACT inhaler, Inhale 2 puffs into the lungs every 6 (six) hours as needed for wheezing or shortness of breath., Disp: 1 Inhaler, Rfl: 2 .  ALPRAZolam (XANAX) 0.5 MG tablet, Take 1 tablet (0.5 mg total) by mouth 3 (three) times daily as needed for anxiety., Disp: 90 tablet, Rfl: 0 .  aspirin EC 81 MG tablet, Take 81 mg by mouth daily., Disp: , Rfl:  .  baclofen (LIORESAL) 10 MG tablet, TAKE 1 TABLET (10 MG TOTAL) BY MOUTH 2 (TWO) TIMES DAILY, Disp: , Rfl: 2 .  budesonide-formoterol (SYMBICORT) 160-4.5 MCG/ACT inhaler, Inhale 1 puff into the lungs 2 (two) times daily., Disp: 1 Inhaler, Rfl: 0 .  gabapentin (NEURONTIN) 100 MG capsule, Take 1 capsule (100 mg  total) by mouth 3 (three) times daily., Disp: 90 capsule, Rfl: 3 .  gabapentin (NEURONTIN) 300 MG capsule, Take 1 capsule (300 mg total) by mouth at bedtime., Disp: 30 capsule, Rfl: 0 .  HYDROcodone-acetaminophen (NORCO/VICODIN) 5-325 MG tablet, Take 1 tablet by mouth every 6 (six) hours as needed. For pain, Disp: 30 tablet, Rfl: 0 .  HYDROcodone-homatropine (HYCODAN) 5-1.5 MG/5ML syrup, Take 5 mLs by mouth every 8 (eight) hours as needed for cough., Disp: 120 mL, Rfl: 0 .  loratadine (CLARITIN) 10 MG tablet, Take 10 mg by mouth 2 (two) times daily., Disp: , Rfl:  .  pantoprazole (PROTONIX) 40 MG tablet, TAKE 1 TABLET BY MOUTH ONCE DAILY, Disp: 90 tablet, Rfl: 1 .  Probiotic Product (PROBIOTIC-10) CHEW, Chew 2 tablets by mouth daily., Disp: , Rfl:  .  promethazine (PHENERGAN) 25 MG tablet, Take 1 tablet (25 mg total) by mouth every 8 (eight) hours as needed for nausea or vomiting., Disp: 30 tablet, Rfl: 0 .  QUEtiapine (SEROQUEL) 50 MG tablet, Take 1 tablet (50 mg total) by mouth at bedtime. (Patient taking differently: Take 75 mg by mouth at bedtime. Takes 1.5 tablets), Disp: 90 tablet, Rfl: 3 .  simvastatin (ZOCOR) 40 MG tablet, TAKE 1 TABLET BY MOUTH AT BEDTIME, Disp: 30 tablet, Rfl: 5 .  Specialty Vitamins Products (BIOTIN PLUS KERATIN) 10000-100 MCG-MG TABS, Take  1 tablet by mouth daily., Disp: , Rfl:   Review of Systems  Constitutional: Positive for appetite change, chills and fatigue.  HENT: Positive for congestion, sinus pressure, sinus pain, sneezing and sore throat.   Respiratory: Positive for cough, chest tightness and wheezing.   Cardiovascular: Negative.   Gastrointestinal: Negative.   Neurological: Negative.     Social History   Tobacco Use  . Smoking status: Current Every Day Smoker    Packs/day: 1.00    Years: 30.00    Pack years: 30.00    Types: Cigarettes  . Smokeless tobacco: Never Used  Substance Use Topics  . Alcohol use: Not Currently    Alcohol/week: 0.0  standard drinks      Objective:   BP 112/69 (BP Location: Left Arm, Patient Position: Sitting, Cuff Size: Normal)   Pulse (!) 57   Temp 98.4 F (36.9 C) (Oral)   Wt 104 lb 9.6 oz (47.4 kg)   BMI 20.43 kg/m  Vitals:   03/13/18 1100  BP: 112/69  Pulse: (!) 57  Temp: 98.4 F (36.9 C)  TempSrc: Oral  Weight: 104 lb 9.6 oz (47.4 kg)     Physical Exam Vitals signs reviewed.  Constitutional:      General: She is not in acute distress.    Appearance: She is well-developed. She is not diaphoretic.  HENT:     Head: Normocephalic and atraumatic.     Right Ear: Hearing, tympanic membrane, ear canal and external ear normal.     Left Ear: Hearing, tympanic membrane, ear canal and external ear normal.     Nose:     Right Sinus: Maxillary sinus tenderness and frontal sinus tenderness present.     Left Sinus: Maxillary sinus tenderness and frontal sinus tenderness present.     Mouth/Throat:     Mouth: Mucous membranes are moist.     Pharynx: Uvula midline. Posterior oropharyngeal erythema (mild cobblestoning) present. No oropharyngeal exudate.  Neck:     Musculoskeletal: Normal range of motion and neck supple.     Thyroid: No thyromegaly.     Trachea: No tracheal deviation.  Cardiovascular:     Rate and Rhythm: Normal rate and regular rhythm.     Heart sounds: Normal heart sounds. No murmur. No friction rub. No gallop.   Pulmonary:     Effort: Pulmonary effort is normal. No respiratory distress.     Breath sounds: No stridor. Wheezing (throughout) present. No rales.  Chest:     Chest wall: No tenderness.  Lymphadenopathy:     Cervical: No cervical adenopathy.  Neurological:     Mental Status: She is alert.  Psychiatric:        Mood and Affect: Mood is anxious and depressed.        Speech: Speech normal.        Assessment & Plan    1. Acute non-recurrent pansinusitis Worsening symptoms that have not responded to OTC medications. Will give Doxycycline as below. Continue  allergy medications. Stay well hydrated and get plenty of rest. Call if no symptom improvement or if symptoms worsen. - doxycycline (VIBRA-TABS) 100 MG tablet; Take 1 tablet (100 mg total) by mouth 2 (two) times daily.  Dispense: 20 tablet; Refill: 0 - predniSONE (STERAPRED UNI-PAK 21 TAB) 10 MG (21) TBPK tablet; 6 day taper; take as directed on package instructions  Dispense: 21 tablet; Refill: 0  2. Bronchitis Worsening. Will treat with prednisone and Hycodan cough syrup. Push fluids. Rest. Call if worsening.  -  predniSONE (STERAPRED UNI-PAK 21 TAB) 10 MG (21) TBPK tablet; 6 day taper; take as directed on package instructions  Dispense: 21 tablet; Refill: 0 - HYDROcodone-homatropine (HYCODAN) 5-1.5 MG/5ML syrup; Take 5 mLs by mouth every 8 (eight) hours as needed for cough.  Dispense: 120 mL; Refill: 0     Mar Daring, PA-C  [facility]

## 2018-03-14 ENCOUNTER — Ambulatory Visit: Payer: BC Managed Care – PPO | Admitting: Family Medicine

## 2018-03-17 ENCOUNTER — Encounter: Payer: Self-pay | Admitting: Physician Assistant

## 2018-03-17 ENCOUNTER — Ambulatory Visit: Payer: BC Managed Care – PPO | Admitting: Physician Assistant

## 2018-03-17 DIAGNOSIS — J0140 Acute pansinusitis, unspecified: Secondary | ICD-10-CM

## 2018-03-17 DIAGNOSIS — J40 Bronchitis, not specified as acute or chronic: Secondary | ICD-10-CM | POA: Diagnosis not present

## 2018-03-17 MED ORDER — PREDNISONE 10 MG (21) PO TBPK: ORAL_TABLET | ORAL | 0 refills | Status: DC

## 2018-03-17 MED ORDER — HYDROCODONE-HOMATROPINE 5-1.5 MG/5ML PO SYRP: 5.0000 mL | ORAL_SOLUTION | Freq: Three times a day (TID) | ORAL | 0 refills | Status: DC | PRN

## 2018-03-17 MED ORDER — AZITHROMYCIN 250 MG PO TABS: ORAL_TABLET | ORAL | 0 refills | Status: DC

## 2018-03-17 NOTE — Progress Notes (Signed)
[name] Female    DOB: 08-26-1955   63 y.o.   MRN: [id number] Visit Date: 03/17/2018  Today's Provider: Mar Daring, PA-C   Chief Complaint  Patient presents with  . Headache  . Otalgia  . Diarrhea   Subjective:     HPI  Patient here today with c/o worsening symptoms. Patient was seen 4 days ago and was prescribed Doxycycline and Prednisone. She reports that her head hurts, ear pain, bilateral and chest tightness, Reports that she started to have diarrhea and nausea yesterday. Patient reports that she is not eating very much but she is trying to drink plenty of fluids. Patient is also taking Hycodan and nasal spray.  Allergies  Allergen Reactions  . Diazepam Other (See Comments)    Other reaction(s): Hallucination Patient states "it makes me crazy" REACTION: Behavioral problems  . Ciprofloxacin Other (See Comments)    Other reaction(s): Unknown Loopy  . Codeine Hives and Itching    REACTION: Dizziness  . Metoprolol     Migraine/headaches   . Morphine Hives    Other reaction(s): Unknown  . Niacin And Related Other (See Comments)    flushing  . Topiramate Other (See Comments)  . Toradol [Ketorolac Tromethamine] Itching    Tolerates when taken with Benadryl  . Iodinated Diagnostic Agents Hives    Pt states she had a CT Head with contrast in the 90's and broke out in hives after the injection. SPM     Current Outpatient Medications:  .  albuterol (PROVENTIL HFA;VENTOLIN HFA) 108 (90 Base) MCG/ACT inhaler, Inhale 2 puffs into the lungs every 6 (six) hours as needed for wheezing or shortness of breath., Disp: 1 Inhaler, Rfl: 2 .  ALPRAZolam (XANAX) 0.5 MG tablet, Take 1 tablet (0.5 mg total) by mouth 3 (three) times daily as needed for anxiety., Disp: 90 tablet, Rfl: 0 .  aspirin EC 81 MG tablet, Take 81 mg by mouth daily., Disp: , Rfl:  .  baclofen (LIORESAL) 10 MG tablet, TAKE 1 TABLET (10 MG TOTAL) BY MOUTH 2 (TWO) TIMES DAILY, Disp: ,  Rfl: 2 .  budesonide-formoterol (SYMBICORT) 160-4.5 MCG/ACT inhaler, Inhale 1 puff into the lungs 2 (two) times daily., Disp: 1 Inhaler, Rfl: 0 .  doxycycline (VIBRA-TABS) 100 MG tablet, Take 1 tablet (100 mg total) by mouth 2 (two) times daily., Disp: 20 tablet, Rfl: 0 .  gabapentin (NEURONTIN) 100 MG capsule, Take 1 capsule (100 mg total) by mouth 3 (three) times daily., Disp: 90 capsule, Rfl: 3 .  gabapentin (NEURONTIN) 300 MG capsule, Take 1 capsule (300 mg total) by mouth at bedtime., Disp: 30 capsule, Rfl: 0 .  HYDROcodone-acetaminophen (NORCO/VICODIN) 5-325 MG tablet, Take 1 tablet by mouth every 6 (six) hours as needed. For pain, Disp: 30 tablet, Rfl: 0 .  HYDROcodone-homatropine (HYCODAN) 5-1.5 MG/5ML syrup, Take 5 mLs by mouth every 8 (eight) hours as needed for cough., Disp: 120 mL, Rfl: 0 .  loratadine (CLARITIN) 10 MG tablet, Take 10 mg by mouth 2 (two) times daily., Disp: , Rfl:  .  pantoprazole (PROTONIX) 40 MG tablet, TAKE 1 TABLET BY MOUTH ONCE DAILY, Disp: 90 tablet, Rfl: 1 .  predniSONE (STERAPRED UNI-PAK 21 TAB) 10 MG (21) TBPK tablet, 6 day taper; take as directed on package instructions, Disp: 21 tablet, Rfl: 0 .  Probiotic Product (PROBIOTIC-10) CHEW, Chew 2 tablets by mouth daily., Disp: , Rfl:  .  promethazine (PHENERGAN) 25 MG tablet, Take 1  tablet (25 mg total) by mouth every 8 (eight) hours as needed for nausea or vomiting., Disp: 30 tablet, Rfl: 0 .  QUEtiapine (SEROQUEL) 50 MG tablet, Take 1 tablet (50 mg total) by mouth at bedtime. (Patient taking differently: Take 75 mg by mouth at bedtime. Takes 1.5 tablets), Disp: 90 tablet, Rfl: 3 .  simvastatin (ZOCOR) 40 MG tablet, TAKE 1 TABLET BY MOUTH AT BEDTIME, Disp: 30 tablet, Rfl: 5 .  Specialty Vitamins Products (BIOTIN PLUS KERATIN) 10000-100 MCG-MG TABS, Take 1 tablet by mouth daily., Disp: , Rfl:   Review of Systems  Constitutional: Positive for fatigue and fever.  HENT: Positive for congestion, ear pain, postnasal  drip, sinus pain and sore throat.   Respiratory: Positive for cough and shortness of breath. Negative for chest tightness and wheezing.   Cardiovascular: Negative for chest pain, palpitations and leg swelling.  Gastrointestinal: Positive for abdominal pain and diarrhea.  Neurological: Positive for dizziness and headaches.    Social History   Tobacco Use  . Smoking status: Current Every Day Smoker    Packs/day: 1.00    Years: 30.00    Pack years: 30.00    Types: Cigarettes  . Smokeless tobacco: Never Used  Substance Use Topics  . Alcohol use: Not Currently    Alcohol/week: 0.0 standard drinks      Objective:   BP 100/60 (BP Location: Left Arm, Patient Position: Sitting, Cuff Size: Normal)   Pulse 66   Temp 98.5 F (36.9 C) (Oral)   Resp 16   Wt 104 lb 3.2 oz (47.3 kg)   SpO2 99%   BMI 20.35 kg/m  Vitals:   03/17/18 1412  BP: 100/60  Pulse: 66  Resp: 16  Temp: 98.5 F (36.9 C)  TempSrc: Oral  SpO2: 99%  Weight: 104 lb 3.2 oz (47.3 kg)     Physical Exam Vitals signs reviewed.  Constitutional:      General: She is not in acute distress.    Appearance: She is well-developed. She is ill-appearing. She is not diaphoretic.  HENT:     Head: Normocephalic and atraumatic.     Right Ear: Hearing, ear canal and external ear normal. A middle ear effusion is present. Tympanic membrane is bulging.     Left Ear: Hearing, ear canal and external ear normal. A middle ear effusion is present. Tympanic membrane is bulging.     Nose:     Right Sinus: Maxillary sinus tenderness and frontal sinus tenderness present.     Left Sinus: Maxillary sinus tenderness and frontal sinus tenderness present.     Mouth/Throat:     Mouth: Mucous membranes are moist.     Pharynx: Uvula midline. Posterior oropharyngeal erythema present. No oropharyngeal exudate.     Tonsils: No tonsillar exudate.  Neck:     Musculoskeletal: Normal range of motion and neck supple.     Thyroid: No thyromegaly.      Trachea: No tracheal deviation.  Cardiovascular:     Rate and Rhythm: Normal rate and regular rhythm.     Heart sounds: Normal heart sounds. No murmur. No friction rub. No gallop.   Pulmonary:     Effort: Pulmonary effort is normal. No respiratory distress.     Breath sounds: No stridor. Wheezing (throughout) present. No rales.  Lymphadenopathy:     Cervical: No cervical adenopathy.  Neurological:     Mental Status: She is alert.        Assessment & Plan    1. Bronchitis  Suspect intolerance to doxycycline as source of diarrhea. Will change therapy to zpak as noted below. Will give another round of prednisone and refill Hycodan cough syrup as below. Call if worsening.  - HYDROcodone-homatropine (HYCODAN) 5-1.5 MG/5ML syrup; Take 5 mLs by mouth every 8 (eight) hours as needed for cough.  Dispense: 120 mL; Refill: 0 - predniSONE (STERAPRED UNI-PAK 21 TAB) 10 MG (21) TBPK tablet; 6 day taper; take as directed on package instructions  Dispense: 21 tablet; Refill: 0 - azithromycin (ZITHROMAX) 250 MG tablet; Take 2 tablets PO on day one, and one tablet PO daily thereafter until completed.  Dispense: 6 tablet; Refill: 0  2. Acute non-recurrent pansinusitis See above medical treatment plan. - predniSONE (STERAPRED UNI-PAK 21 TAB) 10 MG (21) TBPK tablet; 6 day taper; take as directed on package instructions  Dispense: 21 tablet; Refill: 0 - azithromycin (ZITHROMAX) 250 MG tablet; Take 2 tablets PO on day one, and one tablet PO daily thereafter until completed.  Dispense: 6 tablet; Refill: 0     Mar Daring, PA-C  [facility]

## 2018-03-17 NOTE — Patient Instructions (Signed)
Sinusitis, Adult  Sinusitis is inflammation of your sinuses. Sinuses are hollow spaces in the bones around your face. Your sinuses are located:   Around your eyes.   In the middle of your forehead.   Behind your nose.   In your cheekbones.  Mucus normally drains out of your sinuses. When your nasal tissues become inflamed or swollen, mucus can become trapped or blocked. This allows bacteria, viruses, and fungi to grow, which leads to infection. Most infections of the sinuses are caused by a virus.  Sinusitis can develop quickly. It can last for up to 4 weeks (acute) or for more than 12 weeks (chronic). Sinusitis often develops after a cold.  What are the causes?  This condition is caused by anything that creates swelling in the sinuses or stops mucus from draining. This includes:   Allergies.   Asthma.   Infection from bacteria or viruses.   Deformities or blockages in your nose or sinuses.   Abnormal growths in the nose (nasal polyps).   Pollutants, such as chemicals or irritants in the air.   Infection from fungi (rare).  What increases the risk?  You are more likely to develop this condition if you:   Have a weak body defense system (immune system).   Do a lot of swimming or diving.   Overuse nasal sprays.   Smoke.  What are the signs or symptoms?  The main symptoms of this condition are pain and a feeling of pressure around the affected sinuses. Other symptoms include:   Stuffy nose or congestion.   Thick drainage from your nose.   Swelling and warmth over the affected sinuses.   Headache.   Upper toothache.   A cough that may get worse at night.   Extra mucus that collects in the throat or the back of the nose (postnasal drip).   Decreased sense of smell and taste.   Fatigue.   A fever.   Sore throat.   Bad breath.  How is this diagnosed?  This condition is diagnosed based on:   Your symptoms.   Your medical history.   A physical exam.   Tests to find out if your condition is  acute or chronic. This may include:  ? Checking your nose for nasal polyps.  ? Viewing your sinuses using a device that has a light (endoscope).  ? Testing for allergies or bacteria.  ? Imaging tests, such as an MRI or CT scan.  In rare cases, a bone biopsy may be done to rule out more serious types of fungal sinus disease.  How is this treated?  Treatment for sinusitis depends on the cause and whether your condition is chronic or acute.   If caused by a virus, your symptoms should go away on their own within 10 days. You may be given medicines to relieve symptoms. They include:  ? Medicines that shrink swollen nasal passages (topical intranasal decongestants).  ? Medicines that treat allergies (antihistamines).  ? A spray that eases inflammation of the nostrils (topical intranasal corticosteroids).  ? Rinses that help get rid of thick mucus in your nose (nasal saline washes).   If caused by bacteria, your health care provider may recommend waiting to see if your symptoms improve. Most bacterial infections will get better without antibiotic medicine. You may be given antibiotics if you have:  ? A severe infection.  ? A weak immune system.   If caused by narrow nasal passages or nasal polyps, you may need   to have surgery.  Follow these instructions at home:  Medicines   Take, use, or apply over-the-counter and prescription medicines only as told by your health care provider. These may include nasal sprays.   If you were prescribed an antibiotic medicine, take it as told by your health care provider. Do not stop taking the antibiotic even if you start to feel better.  Hydrate and humidify     Drink enough fluid to keep your urine pale yellow. Staying hydrated will help to thin your mucus.   Use a cool mist humidifier to keep the humidity level in your home above 50%.   Inhale steam for 10-15 minutes, 3-4 times a day, or as told by your health care provider. You can do this in the bathroom while a hot shower is  running.   Limit your exposure to cool or dry air.  Rest   Rest as much as possible.   Sleep with your head raised (elevated).   Make sure you get enough sleep each night.  General instructions     Apply a warm, moist washcloth to your face 3-4 times a day or as told by your health care provider. This will help with discomfort.   Wash your hands often with soap and water to reduce your exposure to germs. If soap and water are not available, use hand sanitizer.   Do not smoke. Avoid being around people who are smoking (secondhand smoke).   Keep all follow-up visits as told by your health care provider. This is important.  Contact a health care provider if:   You have a fever.   Your symptoms get worse.   Your symptoms do not improve within 10 days.  Get help right away if:   You have a severe headache.   You have persistent vomiting.   You have severe pain or swelling around your face or eyes.   You have vision problems.   You develop confusion.   Your neck is stiff.   You have trouble breathing.  Summary   Sinusitis is soreness and inflammation of your sinuses. Sinuses are hollow spaces in the bones around your face.   This condition is caused by nasal tissues that become inflamed or swollen. The swelling traps or blocks the flow of mucus. This allows bacteria, viruses, and fungi to grow, which leads to infection.   If you were prescribed an antibiotic medicine, take it as told by your health care provider. Do not stop taking the antibiotic even if you start to feel better.   Keep all follow-up visits as told by your health care provider. This is important.  This information is not intended to replace advice given to you by your health care provider. Make sure you discuss any questions you have with your health care provider.  Document Released: 01/18/2005 Document Revised: 06/20/2017 Document Reviewed: 06/20/2017  Elsevier Interactive Patient Education  2019 [employer]

## 2018-04-15 ENCOUNTER — Other Ambulatory Visit: Payer: Self-pay | Admitting: Family Medicine

## 2018-04-15 DIAGNOSIS — K2270 Barrett's esophagus without dysplasia: Secondary | ICD-10-CM

## 2018-05-30 ENCOUNTER — Encounter: Payer: Self-pay | Admitting: Family Medicine

## 2018-05-30 ENCOUNTER — Other Ambulatory Visit: Payer: Self-pay

## 2018-05-30 ENCOUNTER — Ambulatory Visit: Payer: BC Managed Care – PPO | Admitting: Family Medicine

## 2018-05-30 VITALS — BP 98/56 | HR 61 | Temp 98.3°F | Wt 100.0 lb

## 2018-05-30 DIAGNOSIS — J432 Centrilobular emphysema: Secondary | ICD-10-CM | POA: Diagnosis not present

## 2018-05-30 DIAGNOSIS — M940 Chondrocostal junction syndrome [Tietze]: Secondary | ICD-10-CM | POA: Diagnosis not present

## 2018-05-30 MED ORDER — PREDNISONE 10 MG (48) PO TBPK: ORAL_TABLET | ORAL | 0 refills | Status: DC

## 2018-05-30 NOTE — Progress Notes (Signed)
Jordan Ortega  MRN: [id number] DOB: 1955/10/19  Subjective:  HPI   The patient I s a 63 year old female who presents for evaluation of continued cough.  The patient states she has been coughing since December.  In February the patient was seen and treated with Prednisone, Doxycycline and then ZPak.  She got some better but the cough never went away.  About 2 weeks ago the cough started getting worse again.  She has been using Symbicort BID, and Hydrocodone cough syrup.  She has not tried any Mucinex and she is not using her Albuterol inhaler.  The patient does say that the cough is worse at night.   She denies any production of phlegm.  She describes her chest as feeling heavy.  She states she feels like she needs to cough something up but can't. The patient further states that 4 days ago she was opening a window and felt and heard a popping in her chest.  She describes feeling like she broke her breast bone.  Since that time she wakes up having pain in her back and feels like it is pushing her ribs together.   Patient Active Problem List   Diagnosis Date Noted  . Arm pain 12/05/2017  . Centrilobular emphysema ([place]) 12/04/2017  . SOB (shortness of breath) 03/21/2017  . Premature ventricular contractions 10/21/2016  . Heart palpitations 10/05/2016  . Cyst of right ovary 08/18/2016  . Adnexal mass 08/17/2016  . Bilateral carpal tunnel syndrome 04/08/2016  . Bilateral hand pain 04/08/2016  . 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 Acute onset aura migraine 07/10/2014  . Bad memory 07/10/2014  . Migraine without aura 07/10/2014  . Abdominal pain, epigastric 07/10/2014  . Chronic constipation 07/10/2014  . Weakness of left side of body 11/28/2011  . Acute anxiety 11/20/2009  . Anxiety and depression 11/20/2009  . Edema of extremities 11/20/2009  . Hypercholesterolemia without hypertriglyceridemia 06/18/2009  . 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 Malaise and fatigue  10/29/2008  . Abnormal loss of weight 10/29/2008  . Mixed hyperlipidemia 08/22/2008  . 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 Affective bipolar disorder ([place]) 05/17/2008  . Barrett esophagus 05/17/2008    Past Medical History:  Diagnosis Date  . Adnexal mass   . Anxiety   . Barrett's esophagus   . Bell palsy    left foot also ,   . Bronchitis   . Complication of anesthesia    difficulty waking up  . Degenerative disc disease, cervical   . Depression   . Environmental and seasonal allergies   . GERD (gastroesophageal reflux disease)   . Heart murmur    Hx  . High triglycerides   . Migraines   . Migraines   . Rapid heart rate    Social History   Socioeconomic History  . Marital status: Married    Spouse name: Not on file  . Number of children: Not on file  . Years of education: Not on file  . Highest education level: Not on file  Occupational History  . Not on file  Social Needs  . Financial resource strain: Not hard at all  . Food insecurity:    Worry: Never true    Inability: Never true  . Transportation needs:    Medical: No    Non-medical: No  Tobacco Use  . Smoking status: Current Every Day Smoker    Packs/day: 1.00    Years: 30.00    Pack years: 30.00  Types: Cigarettes  . Smokeless tobacco: Never Used  Substance and Sexual Activity  . Alcohol use: Not Currently    Alcohol/week: 0.0 standard drinks  . Drug use: No  . Sexual activity: Not on file  Lifestyle  . Physical activity:    Days per week: 0 days    Minutes per session: 0 min  . Stress: Not on file  Relationships  . Social connections:    Talks on phone: Patient refused    Gets together: Patient refused    Attends religious service: Patient refused    Active member of club or organization: Patient refused    Attends meetings of clubs or organizations: Patient refused    Relationship status: Patient refused  . Intimate partner violence:    Fear of current or ex partner: Patient refused     Emotionally abused: Patient refused    Physically abused: Patient refused    Forced sexual activity: Patient refused  Other Topics Concern  . Not on file  Social History Narrative  . Not on file    Outpatient Encounter Medications as of 05/30/2018  Medication Sig  . albuterol (PROVENTIL HFA;VENTOLIN HFA) 108 (90 Base) MCG/ACT inhaler Inhale 2 puffs into the lungs every 6 (six) hours as needed for wheezing or shortness of breath.  . ALPRAZolam (XANAX) 0.5 MG tablet Take 1 tablet (0.5 mg total) by mouth 3 (three) times daily as needed for anxiety.  . Ascorbic Acid (VITAMIN C) 1000 MG tablet Take 1,000 mg by mouth daily.  . baclofen (LIORESAL) 10 MG tablet TAKE 1 TABLET (10 MG TOTAL) BY MOUTH 2 (TWO) TIMES DAILY  . budesonide-formoterol (SYMBICORT) 160-4.5 MCG/ACT inhaler Inhale 1 puff into the lungs 2 (two) times daily.  Marland Kitchen gabapentin (NEURONTIN) 100 MG capsule Take 100 mg by mouth 3 (three) times daily. 100 mg am and noon, 200 mg dinner  . gabapentin (NEURONTIN) 300 MG capsule Take 1 capsule (300 mg total) by mouth at bedtime.  Marland Kitchen HYDROcodone-acetaminophen (NORCO/VICODIN) 5-325 MG tablet Take 1 tablet by mouth every 6 (six) hours as needed. For pain  . HYDROcodone-homatropine (HYCODAN) 5-1.5 MG/5ML syrup Take 5 mLs by mouth every 8 (eight) hours as needed for cough.  . loratadine (CLARITIN) 10 MG tablet Take 10 mg by mouth 2 (two) times daily.  . pantoprazole (PROTONIX) 40 MG tablet TAKE 1 TABLET BY MOUTH ONCE DAILY  . Probiotic Product (PROBIOTIC-10) CHEW Chew 2 tablets by mouth daily.  . promethazine (PHENERGAN) 25 MG tablet Take 1 tablet (25 mg total) by mouth every 8 (eight) hours as needed for nausea or vomiting.  Marland Kitchen QUEtiapine (SEROQUEL) 50 MG tablet Take 1 tablet (50 mg total) by mouth at bedtime. (Patient taking differently: Take 75 mg by mouth at bedtime. Takes 1.5 tablets)  . simvastatin (ZOCOR) 40 MG tablet TAKE 1 TABLET BY MOUTH AT BEDTIME  . Specialty Vitamins Products (BIOTIN PLUS  KERATIN) 10000-100 MCG-MG TABS Take 1 tablet by mouth daily.  . [DISCONTINUED] aspirin EC 81 MG tablet Take 81 mg by mouth daily.  . [DISCONTINUED] gabapentin (NEURONTIN) 100 MG capsule Take 1 capsule (100 mg total) by mouth 3 (three) times daily.  . [DISCONTINUED] azithromycin (ZITHROMAX) 250 MG tablet Take 2 tablets PO on day one, and one tablet PO daily thereafter until completed.  . [DISCONTINUED] predniSONE (STERAPRED UNI-PAK 21 TAB) 10 MG (21) TBPK tablet 6 day taper; take as directed on package instructions   No facility-administered encounter medications on file as of 05/30/2018.  Allergies  Allergen Reactions  . Diazepam Other (See Comments)    Other reaction(s): Hallucination Patient states "it makes me crazy" REACTION: Behavioral problems  . Ciprofloxacin Other (See Comments)    Other reaction(s): Unknown Loopy  . Codeine Hives and Itching    REACTION: Dizziness  . Metoprolol     Migraine/headaches   . Morphine Hives    Other reaction(s): Unknown  . Niacin And Related Other (See Comments)    flushing  . Topiramate Other (See Comments)  . Toradol [Ketorolac Tromethamine] Itching    Tolerates when taken with Benadryl  . Doxycycline Diarrhea  . Iodinated Diagnostic Agents Hives    Pt states she had a CT Head with contrast in the 90's and broke out in hives after the injection. SPM    Review of Systems  Constitutional: Negative for chills, fever and malaise/fatigue.  HENT: Negative for congestion, ear discharge, ear pain, sinus pain, sore throat and tinnitus.   Eyes: Negative for blurred vision, double vision, photophobia, pain, discharge and redness.  Respiratory: Positive for cough. Negative for hemoptysis, sputum production, shortness of breath and wheezing.   Cardiovascular: Positive for chest pain (secondary to accident). Negative for palpitations, orthopnea, claudication and leg swelling.    Objective:  BP (!) 98/56 (BP Location: Right Arm, Patient Position:  Sitting, Cuff Size: Normal)   Pulse 61   Temp 98.3 F (36.8 C) (Oral)   Wt 100 lb (45.4 kg)   SpO2 99%   BMI 19.53 kg/m   Physical Exam  Constitutional: She is oriented to person, place, and time and well-developed, well-nourished, and in no distress.  HENT:  Head: Normocephalic.  Eyes: Conjunctivae are normal.  Neck: Neck supple.  Cardiovascular: Normal rate and regular rhythm.  Pulmonary/Chest: Effort normal and breath sounds normal. No respiratory distress. She has no wheezes. She has no rales. She exhibits tenderness.  Pain to palpate along bilateral sternal borders or to compress 5-7 ribs.   Abdominal: Soft.  Musculoskeletal: Normal range of motion.  Neurological: She is alert and oriented to person, place, and time.  Skin: No rash noted.  Psychiatric: Mood, affect and judgment normal.    Assessment and Plan :  1. Costochondritis Pain in lower sternum between breasts since she strained to open a window in February 2020. Very tender to palpate, take a deep breath, cough or sneeze today. No fever, sputum production of significance, wheeze or nasal congestion. Recommend prednisone taper for inflammation and pain. Apply moist heat prn discomfort and restrict lifting or reaching overhead. No need for antibiotic at the present. - predniSONE (STERAPRED UNI-PAK 48 TAB) 10 MG (48) TBPK tablet; Take by mouth as directed on package as a taper.  Dispense: 48 tablet; Refill: 0  2. Centrilobular emphysema ([place]) History of moderate airway obstruction with low capacities on 03-21-17 spirometry. Some morning and evening cough but continues to smoke 1ppd for 30+ years. Continues to use Symbicort 160-4.5 mcg/act 1 puff BID and prn Ventolin-HFA 2 puffs QID. Recommend she stop smoking and given prednisone taper for 2 weeks to decrease inflammation. May need referral to pulmonologist if no better in 2 weeks. - predniSONE (STERAPRED UNI-PAK 48 TAB) 10 MG (48) TBPK tablet; Take by mouth as directed on  package as a taper.  Dispense: 48 tablet; Refill: 0

## 2018-06-08 ENCOUNTER — Other Ambulatory Visit: Payer: Self-pay

## 2018-06-08 ENCOUNTER — Ambulatory Visit
Admission: RE | Admit: 2018-06-08 | Discharge: 2018-06-08 | Disposition: A | Discharge status: Home / Self Care | Payer: BC Managed Care – PPO | Source: Ambulatory Visit | Attending: Family Medicine | Admitting: Family Medicine

## 2018-06-08 ENCOUNTER — Ambulatory Visit
Admission: RE | Admit: 2018-06-08 | Discharge: 2018-06-08 | Disposition: A | Discharge status: Home / Self Care | Payer: BC Managed Care – PPO | Attending: Family Medicine | Admitting: Family Medicine

## 2018-06-08 ENCOUNTER — Encounter: Payer: Self-pay | Admitting: Family Medicine

## 2018-06-08 ENCOUNTER — Ambulatory Visit (INDEPENDENT_AMBULATORY_CARE_PROVIDER_SITE_OTHER): Payer: BC Managed Care – PPO | Admitting: Family Medicine

## 2018-06-08 VITALS — BP 128/76 | HR 61 | Temp 98.6°F | Resp 18 | Wt 104.0 lb

## 2018-06-08 DIAGNOSIS — R0781 Pleurodynia: Secondary | ICD-10-CM | POA: Insufficient documentation

## 2018-06-08 DIAGNOSIS — R0789 Other chest pain: Secondary | ICD-10-CM | POA: Insufficient documentation

## 2018-06-08 DIAGNOSIS — M940 Chondrocostal junction syndrome [Tietze]: Secondary | ICD-10-CM

## 2018-06-08 NOTE — Progress Notes (Signed)
[name] Female    DOB: Apr 26, 1955   63 y.o.   MRN: [id number] Visit Date: 06/08/2018  Today's Provider: Vernie Murders, PA   Chief Complaint  Patient presents with  . Follow-up   Subjective:     HPI  Follow up for Costochronditis:  The patient was last seen for this 1 weeks ago. Changes made at last visit include starting prednisone taper to help with inflammation and pain   She reports good compliance with treatment. She feels that condition is Worse. She is not having side effects.   ------------------------------------------------------------------------------------ Past Medical History:  Diagnosis Date  . Adnexal mass   . Anxiety   . Barrett's esophagus   . Bell palsy    left foot also ,   . Bronchitis   . Complication of anesthesia    difficulty waking up  . Degenerative disc disease, cervical   . Depression   . Environmental and seasonal allergies   . GERD (gastroesophageal reflux disease)   . Heart murmur    Hx  . High triglycerides   . Migraines   . Migraines   . Rapid heart rate    Past Surgical History:  Procedure Laterality Date  . ABDOMINAL HYSTERECTOMY    . APPENDECTOMY    . CARDIAC CATHETERIZATION     [facility] pt doesn't have any stents or recall years ago 20+ yrs ago  . CHOLECYSTECTOMY    . COLONOSCOPY WITH PROPOFOL N/A 07/16/2016   Procedure: COLONOSCOPY WITH PROPOFOL;  Surgeon: [name], MD;  [facility] END[facility]COPY;  Service: Endoscopy;  Laterality: N/A;  . ESOPHAGOGASTRODUODENOSCOPY (EGD) WITH PROPOFOL N/A 07/16/2016   Procedure: ESOPHAGOGASTRODUODENOSCOPY (EGD) WITH PROPOFOL;  Surgeon: [name], MD;  Location: [facility] ENDOSCOPY;  Service: Endoscopy;  Laterality: N/A;  . [place]  . LAPAROSCOPIC SALPINGO OOPHERECTOMY Bilateral 08/27/2016   Procedure: LAPAROSCOPIC SALPINGO OOPHORECTOMY;  Surgeon: Gae Dry, MD;  Location: [facility] ORS;  Service: Gynecology;  Laterality: Bilateral;  . TUBAL  LIGATION  1983   Family History  Problem Relation Age of Onset  . Breast cancer Mother 59  . Skin cancer Mother   . Hypothyroidism Mother   . Parkinson's disease Mother   . Migraines Sister   . Colon cancer Father   . Lung cancer Father   . Heart disease Father   . Diabetes Paternal Grandmother   . Hypertension Paternal Grandmother   . Stroke Paternal Grandmother   . Dementia Paternal Grandfather   . Breast cancer Maternal Aunt        post men.  . Diabetes Paternal Uncle   . Breast cancer Cousin 69       maternal   . Multiple myeloma Cousin    Allergies  Allergen Reactions  . Diazepam Other (See Comments)    Other reaction(s): Hallucination Patient states "it makes me crazy" REACTION: Behavioral problems  . Ciprofloxacin Other (See Comments)    Other reaction(s): Unknown Loopy  . Codeine Hives and Itching    REACTION: Dizziness  . Metoprolol     Migraine/headaches   . Morphine Hives    Other reaction(s): Unknown  . Niacin And Related Other (See Comments)    flushing  . Topiramate Other (See Comments)  . Toradol [Ketorolac Tromethamine] Itching    Tolerates when taken with Benadryl  . Doxycycline Diarrhea  . Iodinated Diagnostic Agents Hives    Pt states she had a CT Head with contrast in the 90's and broke  out in hives after the injection. SPM    Current Outpatient Medications:  .  albuterol (PROVENTIL HFA;VENTOLIN HFA) 108 (90 Base) MCG/ACT inhaler, Inhale 2 puffs into the lungs every 6 (six) hours as needed for wheezing or shortness of breath., Disp: 1 Inhaler, Rfl: 2 .  ALPRAZolam (XANAX) 0.5 MG tablet, Take 1 tablet (0.5 mg total) by mouth 3 (three) times daily as needed for anxiety., Disp: 90 tablet, Rfl: 0 .  Ascorbic Acid (VITAMIN C) 1000 MG tablet, Take 1,000 mg by mouth daily., Disp: , Rfl:  .  baclofen (LIORESAL) 10 MG tablet, TAKE 1 TABLET (10 MG TOTAL) BY MOUTH 2 (TWO) TIMES DAILY, Disp: , Rfl: 2 .  budesonide-formoterol (SYMBICORT) 160-4.5 MCG/ACT  inhaler, Inhale 1 puff into the lungs 2 (two) times daily., Disp: 1 Inhaler, Rfl: 0 .  gabapentin (NEURONTIN) 100 MG capsule, Take 100 mg by mouth 3 (three) times daily. 100 mg am and noon, 200 mg dinner, Disp: , Rfl:  .  gabapentin (NEURONTIN) 300 MG capsule, Take 1 capsule (300 mg total) by mouth at bedtime., Disp: 30 capsule, Rfl: 0 .  HYDROcodone-acetaminophen (NORCO/VICODIN) 5-325 MG tablet, Take 1 tablet by mouth every 6 (six) hours as needed. For pain, Disp: 30 tablet, Rfl: 0 .  HYDROcodone-homatropine (HYCODAN) 5-1.5 MG/5ML syrup, Take 5 mLs by mouth every 8 (eight) hours as needed for cough., Disp: 120 mL, Rfl: 0 .  loratadine (CLARITIN) 10 MG tablet, Take 10 mg by mouth 2 (two) times daily., Disp: , Rfl:  .  pantoprazole (PROTONIX) 40 MG tablet, TAKE 1 TABLET BY MOUTH ONCE DAILY, Disp: 90 tablet, Rfl: 1 .  predniSONE (STERAPRED UNI-PAK 48 TAB) 10 MG (48) TBPK tablet, Take by mouth as directed on package as a taper., Disp: 48 tablet, Rfl: 0 .  Probiotic Product (PROBIOTIC-10) CHEW, Chew 2 tablets by mouth daily., Disp: , Rfl:  .  promethazine (PHENERGAN) 25 MG tablet, Take 1 tablet (25 mg total) by mouth every 8 (eight) hours as needed for nausea or vomiting., Disp: 30 tablet, Rfl: 0 .  QUEtiapine (SEROQUEL) 50 MG tablet, Take 1 tablet (50 mg total) by mouth at bedtime. (Patient taking differently: Take 75 mg by mouth at bedtime. Takes 1.5 tablets), Disp: 90 tablet, Rfl: 3 .  simvastatin (ZOCOR) 40 MG tablet, TAKE 1 TABLET BY MOUTH AT BEDTIME, Disp: 30 tablet, Rfl: 5 .  Specialty Vitamins Products (BIOTIN PLUS KERATIN) 10000-100 MCG-MG TABS, Take 1 tablet by mouth daily., Disp: , Rfl:   Review of Systems  Constitutional: Negative for appetite change, chills, fatigue and fever.  Respiratory: Negative for chest tightness and shortness of breath.   Cardiovascular: Positive for chest pain (in center of chest at the breast bone). Negative for palpitations.  Gastrointestinal: Negative for  abdominal pain, nausea and vomiting.  Neurological: Negative for dizziness and weakness.   Social History   Tobacco Use  . Smoking status: Current Every Day Smoker    Packs/day: 1.00    Years: 30.00    Pack years: 30.00    Types: Cigarettes  . Smokeless tobacco: Never Used  Substance Use Topics  . Alcohol use: Not Currently    Alcohol/week: 0.0 standard drinks     Objective:   BP 128/76 (BP Location: Right Arm, Cuff Size: Normal)   Pulse 61   Temp 98.6 F (37 C) (Oral)   Resp 18   Wt 104 lb (47.2 kg)   SpO2 95% Comment: room air  BMI 20.31 kg/m  Vitals:  06/08/18 1019 06/08/18 1022  BP: 138/75 128/76  Pulse: 61   Resp: 18   Temp: 98.6 F (37 C)   TempSrc: Oral   SpO2: 95%   Weight: 104 lb (47.2 kg)    Physical Exam Constitutional:      General: She is not in acute distress.    Appearance: She is well-developed.  HENT:     Head: Normocephalic and atraumatic.     Right Ear: Hearing normal.     Left Ear: Hearing normal.     Nose: Nose normal.  Eyes:     General: Lids are normal. No scleral icterus.       Right eye: No discharge.        Left eye: No discharge.     Conjunctiva/sclera: Conjunctivae normal.  Neck:     Musculoskeletal: Neck supple.  Cardiovascular:     Rate and Rhythm: Normal rate and regular rhythm.  Pulmonary:     Effort: Pulmonary effort is normal. No respiratory distress.     Breath sounds: No wheezing.     Comments: Sharp pain in mid sternum and costochondral joints from manubrium joint to xyphoid process. No rales, wheezes or rhonchi. Chest:     Chest wall: Tenderness present.  Musculoskeletal: Normal range of motion.  Skin:    Findings: No lesion or rash.  Neurological:     Mental Status: She is alert and oriented to person, place, and time.  Psychiatric:        Speech: Speech normal.        Behavior: Behavior normal.        Thought Content: Thought content normal.       Assessment & Plan    1. Sternal pain Has had a sharp  pain in the body of the sternum over the past 2 weeks. Initially felt a pop then sharp pain while she was opening a window that got stuck. Pain is worse with certain movements, lifting or cough. Some pain to wear seatbelt now. Has been a smoker for years and using patches to stop now. Plan is to stop all smoking 06-11-18 when she may get to hold a new grandchild for the first time. Admits she has not had a bone density test in a few years. Will check labs and get sternum x-ray to rule out spontaneous fracture. May need to schedule bone density test, also. May apply moist heat. Has had to use prednisone taper a couple times this year for acute exacerbation of COPD. - Sedimentation rate - Comprehensive metabolic panel - CBC with Differential/Platelet - DG Sternum  2. Costochondritis Very tender along both sternal borders to palpate or to compress middle ribs bilaterally. Not much help from prednisone taper this time. Will check labs for inflammation, alkaline phosphatase elevation, drop in calcium and any signs of infection or anemia. - Sedimentation rate - Comprehensive metabolic panel - CBC with Differential/Platelet     Vernie Murders, PA  [facility]

## 2018-06-09 LAB — CBC WITH DIFFERENTIAL/PLATELET
Basophils Absolute: 0 10*3/uL (ref 0.0–0.2)
Basos: 0 %
EOS (ABSOLUTE): 0.1 10*3/uL (ref 0.0–0.4)
Eos: 1 %
Hematocrit: 36.4 % (ref 34.0–46.6)
Hemoglobin: 12.7 g/dL (ref 11.1–15.9)
Immature Grans (Abs): 0.1 10*3/uL (ref 0.0–0.1)
Immature Granulocytes: 1 %
Lymphocytes Absolute: 3.6 10*3/uL — ABNORMAL HIGH (ref 0.7–3.1)
Lymphs: 35 %
MCH: 32.8 pg (ref 26.6–33.0)
MCHC: 34.9 g/dL (ref 31.5–35.7)
MCV: 94 fL (ref 79–97)
Monocytes Absolute: 0.8 10*3/uL (ref 0.1–0.9)
Monocytes: 8 %
Neutrophils Absolute: 5.7 10*3/uL (ref 1.4–7.0)
Neutrophils: 55 %
Platelets: 239 10*3/uL (ref 150–450)
RBC: 3.87 x10E6/uL (ref 3.77–5.28)
RDW: 12.7 % (ref 11.7–15.4)
WBC: 10.3 10*3/uL (ref 3.4–10.8)

## 2018-06-09 LAB — COMPREHENSIVE METABOLIC PANEL
ALT: 13 IU/L (ref 0–32)
AST: 13 IU/L (ref 0–40)
Albumin/Globulin Ratio: 2.5 — ABNORMAL HIGH (ref 1.2–2.2)
Albumin: 4.2 g/dL (ref 3.8–4.8)
Alkaline Phosphatase: 61 IU/L (ref 39–117)
BUN/Creatinine Ratio: 20 (ref 12–28)
BUN: 18 mg/dL (ref 8–27)
Bilirubin Total: <0.2 mg/dL (ref 0.0–1.2)
CO2: 27 mmol/L (ref 20–29)
Calcium: 9.3 mg/dL (ref 8.7–10.3)
Chloride: 104 mmol/L (ref 96–106)
Creatinine, Ser: 0.88 mg/dL (ref 0.57–1.00)
GFR calc Af Amer: 82 mL/min/{1.73_m2} (ref >59)
GFR calc non Af Amer: 71 mL/min/{1.73_m2} (ref >59)
Globulin, Total: 1.7 g/dL (ref 1.5–4.5)
Glucose: 77 mg/dL (ref 65–99)
Potassium: 3.9 mmol/L (ref 3.5–5.2)
Sodium: 144 mmol/L (ref 134–144)
Total Protein: 5.9 g/dL — ABNORMAL LOW (ref 6.0–8.5)

## 2018-06-09 LAB — SEDIMENTATION RATE: Sed Rate: 2 mm/hr (ref 0–40)

## 2018-08-14 ENCOUNTER — Encounter: Payer: Self-pay | Admitting: Family Medicine

## 2018-08-14 ENCOUNTER — Other Ambulatory Visit: Payer: Self-pay

## 2018-08-14 ENCOUNTER — Ambulatory Visit: Payer: BC Managed Care – PPO | Admitting: Family Medicine

## 2018-08-14 ENCOUNTER — Other Ambulatory Visit: Payer: Self-pay | Admitting: Family Medicine

## 2018-08-14 VITALS — BP 106/62 | HR 70 | Temp 98.6°F | Wt 97.0 lb

## 2018-08-14 DIAGNOSIS — R5381 Other malaise: Secondary | ICD-10-CM | POA: Diagnosis not present

## 2018-08-14 DIAGNOSIS — R110 Nausea: Secondary | ICD-10-CM

## 2018-08-14 DIAGNOSIS — G43409 Hemiplegic migraine, not intractable, without status migrainosus: Secondary | ICD-10-CM | POA: Diagnosis not present

## 2018-08-14 DIAGNOSIS — R5383 Other fatigue: Secondary | ICD-10-CM

## 2018-08-14 DIAGNOSIS — W57XXXA Bitten or stung by nonvenomous insect and other nonvenomous arthropods, initial encounter: Secondary | ICD-10-CM

## 2018-08-14 MED ORDER — DIPHENHYDRAMINE HCL 50 MG/ML IJ SOLN: 50.0000 mg | Freq: Once | INTRAMUSCULAR | Status: DC

## 2018-08-14 MED ORDER — DIPHENHYDRAMINE HCL 50 MG/ML IJ SOLN: 25.0000 mg | Freq: Once | INTRAMUSCULAR | Status: DC

## 2018-08-14 MED ORDER — AMOXICILLIN 875 MG PO TABS: 875.0000 mg | ORAL_TABLET | Freq: Two times a day (BID) | ORAL | 0 refills | Status: DC

## 2018-08-14 MED ORDER — KETOROLAC TROMETHAMINE 60 MG/2ML IM SOLN
60.0000 mg | Freq: Once | INTRAMUSCULAR | Status: AC
  Administered 2018-08-14: 60 mg via INTRAMUSCULAR

## 2018-08-14 MED ORDER — KETOROLAC TROMETHAMINE 60 MG/2ML IM SOLN: 60.0000 mg | Freq: Once | INTRAMUSCULAR | Status: DC

## 2018-08-14 MED ORDER — DIPHENHYDRAMINE HCL 50 MG/ML IJ SOLN
50.0000 mg | Freq: Once | INTRAMUSCULAR | Status: AC
  Administered 2018-08-14: 25 mg via INTRAMUSCULAR

## 2018-08-14 NOTE — Addendum Note (Signed)
Addended by: Althea Charon D on: 08/14/2018 04:33 PM   Modules accepted: Orders

## 2018-08-14 NOTE — Progress Notes (Signed)
Jordan Ortega  MRN: [id number] DOB: 1955-03-11  Subjective:  HPI  The patient is a 63 year female who presents with originally complaint of vomiting and diarrhea with no other symptoms.  While talking to patient she admitted to having fatigue, headaches, nausea, vomiting, diarrhea, abdominal pain, dizziness and sweats.    She also reports that she was bit by a tick about the beginning of June.  She said she developed a welt that was the size of her thumb.  She developed a rash with fever in it the size of her hand at the site of the bite.    The patient also reports that she thinks she passed a worm in her stool on Saturday night.  Patient Active Problem List   Diagnosis Date Noted  . Arm pain 12/05/2017  . Centrilobular emphysema ([place]) 12/04/2017  . SOB (shortness of breath) 03/21/2017  . Premature ventricular contractions 10/21/2016  . Heart palpitations 10/05/2016  . Cyst of right ovary 08/18/2016  . Adnexal mass 08/17/2016  . Bilateral carpal tunnel syndrome 04/08/2016  . Bilateral hand pain 04/08/2016  . 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 Acute onset aura migraine 07/10/2014  . Bad memory 07/10/2014  . Migraine without aura 07/10/2014  . Abdominal pain, epigastric 07/10/2014  . Chronic constipation 07/10/2014  . Weakness of left side of body 11/28/2011  . Acute anxiety 11/20/2009  . Anxiety and depression 11/20/2009  . Edema of extremities 11/20/2009  . Hypercholesterolemia without hypertriglyceridemia 06/18/2009  . 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 Malaise and fatigue 10/29/2008  . Abnormal loss of weight 10/29/2008  . Mixed hyperlipidemia 08/22/2008  . 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 Affective bipolar disorder ([place]) 05/17/2008  . Barrett esophagus 05/17/2008    Past Medical History:  Diagnosis Date  . Adnexal mass   . Anxiety   . Barrett's esophagus   . Bell palsy    left foot also ,   . Bronchitis   . Complication of anesthesia     difficulty waking up  . Degenerative disc disease, cervical   . Depression   . Environmental and seasonal allergies   . GERD (gastroesophageal reflux disease)   . Heart murmur    Hx  . High triglycerides   . Migraines   . Migraines   . Rapid heart rate     Social History   Socioeconomic History  . Marital status: Married    Spouse name: Not on file  . Number of children: Not on file  . Years of education: Not on file  . Highest education level: Not on file  Occupational History  . Not on file  Social Needs  . Financial resource strain: Not hard at all  . Food insecurity    Worry: Never true    Inability: Never true  . Transportation needs    Medical: No    Non-medical: No  Tobacco Use  . Smoking status: Current Every Day Smoker    Packs/day: 1.00    Years: 30.00    Pack years: 30.00    Types: Cigarettes  . Smokeless tobacco: Never Used  Substance and Sexual Activity  . Alcohol use: Not Currently    Alcohol/week: 0.0 standard drinks  . Drug use: No  . Sexual activity: Not on file  Lifestyle  . Physical activity    Days per week: 0 days    Minutes per session: 0 min  . Stress: Not on file  Relationships  . Social connections  Talks on phone: Patient refused    Gets together: Patient refused    Attends religious service: Patient refused    Active member of club or organization: Patient refused    Attends meetings of clubs or organizations: Patient refused    Relationship status: Patient refused  . Intimate partner violence    Fear of current or ex partner: Patient refused    Emotionally abused: Patient refused    Physically abused: Patient refused    Forced sexual activity: Patient refused  Other Topics Concern  . Not on file  Social History Narrative  . Not on file    Outpatient Encounter Medications as of 08/14/2018  Medication Sig  . albuterol (PROVENTIL HFA;VENTOLIN HFA) 108 (90 Base) MCG/ACT inhaler Inhale 2 puffs into the lungs every 6 (six)  hours as needed for wheezing or shortness of breath.  . ALPRAZolam (XANAX) 0.5 MG tablet Take 1 tablet (0.5 mg total) by mouth 3 (three) times daily as needed for anxiety.  . Ascorbic Acid (VITAMIN C) 1000 MG tablet Take 1,000 mg by mouth daily.  . baclofen (LIORESAL) 10 MG tablet TAKE 1 TABLET (10 MG TOTAL) BY MOUTH 2 (TWO) TIMES DAILY  . budesonide-formoterol (SYMBICORT) 160-4.5 MCG/ACT inhaler Inhale 1 puff into the lungs 2 (two) times daily.  Marland Kitchen gabapentin (NEURONTIN) 100 MG capsule Take 100 mg by mouth 3 (three) times daily. 100 mg am and noon, 200 mg dinner  . HYDROcodone-acetaminophen (NORCO/VICODIN) 5-325 MG tablet Take 1 tablet by mouth every 6 (six) hours as needed. For pain  . loratadine (CLARITIN) 10 MG tablet Take 10 mg by mouth 2 (two) times daily.  . pantoprazole (PROTONIX) 40 MG tablet TAKE 1 TABLET BY MOUTH ONCE DAILY  . Probiotic Product (PROBIOTIC-10) CHEW Chew 2 tablets by mouth daily.  . promethazine (PHENERGAN) 25 MG tablet Take 1 tablet (25 mg total) by mouth every 8 (eight) hours as needed for nausea or vomiting.  Marland Kitchen QUEtiapine (SEROQUEL) 50 MG tablet Take 1 tablet (50 mg total) by mouth at bedtime. (Patient taking differently: Take 75 mg by mouth at bedtime. Takes 1.5 tablets)  . simvastatin (ZOCOR) 40 MG tablet TAKE 1 TABLET BY MOUTH AT BEDTIME  . Specialty Vitamins Products (BIOTIN PLUS KERATIN) 10000-100 MCG-MG TABS Take 1 tablet by mouth daily.  . [DISCONTINUED] gabapentin (NEURONTIN) 300 MG capsule Take 1 capsule (300 mg total) by mouth at bedtime.  . [DISCONTINUED] HYDROcodone-homatropine (HYCODAN) 5-1.5 MG/5ML syrup Take 5 mLs by mouth every 8 (eight) hours as needed for cough.  . [DISCONTINUED] predniSONE (STERAPRED UNI-PAK 48 TAB) 10 MG (48) TBPK tablet Take by mouth as directed on package as a taper.   No facility-administered encounter medications on file as of 08/14/2018.     Allergies  Allergen Reactions  . Diazepam Other (See Comments)    Other reaction(s):  Hallucination Patient states "it makes me crazy" REACTION: Behavioral problems  . Ciprofloxacin Other (See Comments)    Other reaction(s): Unknown Loopy  . Codeine Hives and Itching    REACTION: Dizziness  . Metoprolol     Migraine/headaches   . Morphine Hives    Other reaction(s): Unknown  . Niacin And Related Other (See Comments)    flushing  . Topiramate Other (See Comments)  . Toradol [Ketorolac Tromethamine] Itching    Tolerates when taken with Benadryl  . Doxycycline Diarrhea  . Iodinated Diagnostic Agents Hives    Pt states she had a CT Head with contrast in the 90's and broke out in  hives after the injection. SPM    Review of Systems  Constitutional: Positive for diaphoresis and malaise/fatigue. Negative for chills and fever.  HENT: Negative for congestion, ear discharge, ear pain, hearing loss, nosebleeds, sinus pain, sore throat and tinnitus.   Respiratory: Negative for cough, shortness of breath and wheezing.   Cardiovascular: Negative for chest pain, palpitations and leg swelling.  Gastrointestinal: Positive for abdominal pain, diarrhea, melena (dark stools), nausea and vomiting. Negative for blood in stool, constipation and heartburn.  Musculoskeletal: Negative for myalgias.  Neurological: Positive for dizziness (when she gets really weak, which was all weekend) and headaches.    Objective:  BP 106/62 (BP Location: Right Arm, Patient Position: Sitting, Cuff Size: Normal)   Pulse 70   Temp 98.6 F (37 C) (Oral)   Wt 97 lb (44 kg)   SpO2 99%   BMI 18.94 kg/m   Wt Readings from Last 3 Encounters:  08/14/18 97 lb (44 kg)  06/08/18 104 lb (47.2 kg)  05/30/18 100 lb (45.4 kg)   Physical Exam  Constitutional: She is oriented to person, place, and time and well-developed, well-nourished, and in no distress.  HENT:  Head: Normocephalic.  Eyes: Conjunctivae are normal.  Neck: Neck supple.  Pulmonary/Chest: Effort normal.  Abdominal: Soft.  Musculoskeletal:  Normal range of motion.  Neurological: She is alert and oriented to person, place, and time.  Skin: No rash noted.  Psychiatric: Mood, affect and judgment normal.    Assessment and Plan :  1. Hemiplegic migraine without status migrainosus, not intractable Onset 07-12-18 with some facial and body weakness, pounding headache and photosensitivity as she has had with hemiplegic migraines in the past. No facial weakness at the present. Has been on Aimovig 140 mg injections per Dr. Manuella Ghazi (neurologist) with prn Norco 5/325 mg one tablet hs, gabapentin 100 mg morning and lunch with 300 mg hs and cymbalta 40 mg qd. Able to get extra relief from migraine with Toradol and Benadryl injections during severe migraine. Home to rest after injections today. Follow up with Dr. Manuella Ghazi if no better with this regimen. - ketorolac (TORADOL) injection 60 mg - diphenhydrAMINE (BENADRYL) injection 25 mg  2. Nausea Fatigue, headache, diarrhea with nausea and vomiting for 1-2 days over the weekend. No abdominal pain today. No diarrhea since this morning. Will check CBC and CMP to rule out dehydration and given COVID Drive-Up test requisition. - Novel Coronavirus, NAA ([facility])  Drive up testing site only - CBC with Differential/Platelet - Comprehensive metabolic panel  3. Tick bite, initial encounter Had 5-6 tick bites over the past 2-3 weeks. First bite in the left upper inner thigh had a large area to erythema that has faded now. No fevers. Having headache, malaise and GI upset. Will treat with Amoxil (intolerant to Doxycycline - GI upset). Will check Lyme antibody test and CBC with diff. Make COVID-19 test requisition available and recommend she go home to isolation. - CBC with Differential/Platelet - Lyme Ab/Western Blot Reflex - amoxicillin (AMOXIL) 875 MG tablet; Take 1 tablet (875 mg total) by mouth 2 (two) times daily.  Dispense: 20 tablet; Refill: 0  4. Malaise and fatigue Developed some nausea and diarrhea  08-11-18 with migraine and generalized fatigue/malaise. May be Lyme's Disease from tick bites and red rash around bite site 2-3 weeks ago. No fever, cough, congestion, loss of taste or smell. No exposure to anyone tested positive for COVID-19. Will make drive-up COVID test requisition available. Advised to isolate at home until lab reports  are available. - Novel Coronavirus, NAA ([facility])  Drive up testing site only - CBC with Differential/Platelet - Lyme Ab/Western Blot Reflex - Comprehensive metabolic panel

## 2018-08-16 ENCOUNTER — Other Ambulatory Visit: Payer: Self-pay

## 2018-08-16 ENCOUNTER — Telehealth: Payer: Self-pay | Admitting: Gastroenterology

## 2018-08-16 DIAGNOSIS — R197 Diarrhea, unspecified: Secondary | ICD-10-CM

## 2018-08-16 LAB — CBC WITH DIFFERENTIAL/PLATELET
Basophils Absolute: 0.1 10*3/uL (ref 0.0–0.2)
Basos: 1 %
EOS (ABSOLUTE): 0.5 10*3/uL — ABNORMAL HIGH (ref 0.0–0.4)
Eos: 6 %
Hematocrit: 39.2 % (ref 34.0–46.6)
Hemoglobin: 13.8 g/dL (ref 11.1–15.9)
Immature Grans (Abs): 0 10*3/uL (ref 0.0–0.1)
Immature Granulocytes: 0 %
Lymphocytes Absolute: 2.2 10*3/uL (ref 0.7–3.1)
Lymphs: 28 %
MCH: 33.1 pg — ABNORMAL HIGH (ref 26.6–33.0)
MCHC: 35.2 g/dL (ref 31.5–35.7)
MCV: 94 fL (ref 79–97)
Monocytes Absolute: 0.7 10*3/uL (ref 0.1–0.9)
Monocytes: 8 %
Neutrophils Absolute: 4.5 10*3/uL (ref 1.4–7.0)
Neutrophils: 57 %
Platelets: 229 10*3/uL (ref 150–450)
RBC: 4.17 x10E6/uL (ref 3.77–5.28)
RDW: 12.9 % (ref 11.7–15.4)
WBC: 7.9 10*3/uL (ref 3.4–10.8)

## 2018-08-16 LAB — COMPREHENSIVE METABOLIC PANEL
ALT: 9 IU/L (ref 0–32)
AST: 16 IU/L (ref 0–40)
Albumin/Globulin Ratio: 2.4 — ABNORMAL HIGH (ref 1.2–2.2)
Albumin: 4.5 g/dL (ref 3.8–4.8)
Alkaline Phosphatase: 61 IU/L (ref 39–117)
BUN/Creatinine Ratio: 16 (ref 12–28)
BUN: 15 mg/dL (ref 8–27)
Bilirubin Total: <0.2 mg/dL (ref 0.0–1.2)
CO2: 24 mmol/L (ref 20–29)
Calcium: 9.4 mg/dL (ref 8.7–10.3)
Chloride: 101 mmol/L (ref 96–106)
Creatinine, Ser: 0.94 mg/dL (ref 0.57–1.00)
GFR calc Af Amer: 76 mL/min/{1.73_m2} (ref >59)
GFR calc non Af Amer: 66 mL/min/{1.73_m2} (ref >59)
Globulin, Total: 1.9 g/dL (ref 1.5–4.5)
Glucose: 86 mg/dL (ref 65–99)
Potassium: 3.8 mmol/L (ref 3.5–5.2)
Sodium: 142 mmol/L (ref 134–144)
Total Protein: 6.4 g/dL (ref 6.0–8.5)

## 2018-08-16 LAB — LYME AB/WESTERN BLOT REFLEX
LYME DISEASE AB, QUANT, IGM: <0.8 index (ref 0.00–0.79)
Lyme IgG/IgM Ab: <0.91 {ISR} (ref 0.00–0.90)

## 2018-08-16 NOTE — Telephone Encounter (Signed)
Get stool for cdiff and pcr

## 2018-08-16 NOTE — Telephone Encounter (Signed)
Patient called & states she was having sever diarrhea & weight loss. She has taken Imodium with no relief & everything she eats or drinks runs though her with in an hour. Please advise.  She uses the [facility].

## 2018-08-17 NOTE — Telephone Encounter (Signed)
Spoke with pt and informed her of Dr. Georgeann Oppenheim suggestions. Pt understands and agrees to continue the Imodium.

## 2018-08-17 NOTE — Telephone Encounter (Signed)
In the mean while drink lots of fluids and take imodium 4 mg BID, if its an infection needs antibiotics and can only kbnow if it is after stool tests

## 2018-08-21 ENCOUNTER — Telehealth: Payer: Self-pay | Admitting: Gastroenterology

## 2018-08-21 LAB — GI PROFILE, STOOL, PCR

## 2018-08-21 NOTE — Telephone Encounter (Signed)
Pt is calling to check on Stool sample she dropped off for the results

## 2018-08-21 NOTE — Telephone Encounter (Signed)
Spoke with pt and informed her that we are still waiting for the complete results. I explained that we will contact her once we have all results and Dr. Vicente Males reviews. Pt understands.

## 2018-08-22 ENCOUNTER — Encounter: Payer: Self-pay | Admitting: Gastroenterology

## 2018-08-22 LAB — C DIFFICILE, CYTOTOXIN B

## 2018-08-22 LAB — C DIFFICILE TOXINS A+B W/RFLX: C difficile Toxins A+B, EIA: NEGATIVE

## 2018-08-22 NOTE — Telephone Encounter (Signed)
Pt is calling to check if her results are back please call pt

## 2018-08-22 NOTE — Progress Notes (Signed)
Inform stool tests are negative enquire how she is doing?

## 2018-08-23 ENCOUNTER — Encounter: Payer: Self-pay | Admitting: Intensive Care

## 2018-08-23 ENCOUNTER — Emergency Department
Admission: EM | Admit: 2018-08-23 | Discharge: 2018-08-23 | Disposition: A | Discharge status: Home / Self Care | Payer: BC Managed Care – PPO | Attending: Emergency Medicine | Admitting: Emergency Medicine

## 2018-08-23 ENCOUNTER — Other Ambulatory Visit: Payer: Self-pay

## 2018-08-23 DIAGNOSIS — J449 Chronic obstructive pulmonary disease, unspecified: Secondary | ICD-10-CM | POA: Insufficient documentation

## 2018-08-23 DIAGNOSIS — R197 Diarrhea, unspecified: Secondary | ICD-10-CM

## 2018-08-23 DIAGNOSIS — F17210 Nicotine dependence, cigarettes, uncomplicated: Secondary | ICD-10-CM | POA: Insufficient documentation

## 2018-08-23 HISTORY — DX: Chronic obstructive pulmonary disease, unspecified: J44.9

## 2018-08-23 LAB — CBC
HCT: 39.3 % (ref 36.0–46.0)
Hemoglobin: 13.3 g/dL (ref 12.0–15.0)
MCH: 32.4 pg (ref 26.0–34.0)
MCHC: 33.8 g/dL (ref 30.0–36.0)
MCV: 95.9 fL (ref 80.0–100.0)
Platelets: 223 10*3/uL (ref 150–400)
RBC: 4.1 MIL/uL (ref 3.87–5.11)
RDW: 12.7 % (ref 11.5–15.5)
WBC: 6.8 10*3/uL (ref 4.0–10.5)
nRBC: 0 % (ref 0.0–0.2)

## 2018-08-23 LAB — COMPREHENSIVE METABOLIC PANEL
ALT: 13 U/L (ref 0–44)
AST: 18 U/L (ref 15–41)
Albumin: 4.1 g/dL (ref 3.5–5.0)
Alkaline Phosphatase: 56 U/L (ref 38–126)
Anion gap: 9 (ref 5–15)
BUN: 14 mg/dL (ref 8–23)
CO2: 26 mmol/L (ref 22–32)
Calcium: 9 mg/dL (ref 8.9–10.3)
Chloride: 106 mmol/L (ref 98–111)
Creatinine, Ser: 0.82 mg/dL (ref 0.44–1.00)
GFR calc Af Amer: >60 mL/min (ref >60)
GFR calc non Af Amer: >60 mL/min (ref >60)
Glucose, Bld: 97 mg/dL (ref 70–99)
Potassium: 3.7 mmol/L (ref 3.5–5.1)
Sodium: 141 mmol/L (ref 135–145)
Total Bilirubin: 0.4 mg/dL (ref 0.3–1.2)
Total Protein: 6.5 g/dL (ref 6.5–8.1)

## 2018-08-23 LAB — LIPASE, BLOOD: Lipase: 48 U/L (ref 11–51)

## 2018-08-23 MED ORDER — SODIUM CHLORIDE 0.9 % IV SOLN
1000.0000 mL | Freq: Once | INTRAVENOUS | Status: AC
  Administered 2018-08-23: 1000 mL via INTRAVENOUS

## 2018-08-23 MED ORDER — ACETAMINOPHEN 325 MG PO TABS
650.0000 mg | ORAL_TABLET | Freq: Once | ORAL | Status: AC
  Administered 2018-08-23: 650 mg via ORAL
  Filled 2018-08-23: qty 2

## 2018-08-23 MED ORDER — DIPHENHYDRAMINE HCL 50 MG/ML IJ SOLN
12.5000 mg | Freq: Once | INTRAMUSCULAR | Status: AC
  Administered 2018-08-23: 12.5 mg via INTRAVENOUS
  Filled 2018-08-23: qty 1

## 2018-08-23 MED ORDER — KETOROLAC TROMETHAMINE 30 MG/ML IJ SOLN
15.0000 mg | Freq: Once | INTRAMUSCULAR | Status: AC
  Administered 2018-08-23: 15 mg via INTRAVENOUS
  Filled 2018-08-23: qty 1

## 2018-08-23 NOTE — Telephone Encounter (Signed)
Pt husband is calling very concerned about pt stating she is very weak and can not keep anything down not even Ensure( drink)  He needs to speak to someone regarding this to check if he should take her to get IV? Please call him

## 2018-08-23 NOTE — ED Provider Notes (Signed)
[facility] Emergency Department Provider Note   ____________________________________________    I have reviewed the triage vital signs and the nursing notes.   HISTORY  Chief Complaint Diarrhea    HPI Jordan Ortega is a 63 y.o. female who presents with complaints of diarrhea.  Patient reports she has had diarrhea for over 10 days.  Was sent to the ED for IV fluids for possible dehydration.  Had stool panel done which was negative for C. difficile and negative entirely.  Reviewed in records.  She has been seeing GI.  Denies fevers or chills.  No cough.  Has been taking Imodium.  Denies dizziness.  No abdominal pain except prior to diarrhea.  No vomiting  Past Medical History:  Diagnosis Date  . Adnexal mass   . Anxiety   . Barrett's esophagus   . Bell palsy    left foot also ,   . Bronchitis   . Complication of anesthesia    difficulty waking up  . COPD (chronic obstructive pulmonary disease) ([place])   . Degenerative disc disease, cervical   . Depression   . Environmental and seasonal allergies   . GERD (gastroesophageal reflux disease)   . Heart murmur    Hx  . High triglycerides   . Migraines   . Migraines   . Rapid heart rate     Patient Active Problem List   Diagnosis Date Noted  . Arm pain 12/05/2017  . Centrilobular emphysema ([place]) 12/04/2017  . SOB (shortness of breath) 03/21/2017  . Premature ventricular contractions 10/21/2016  . Heart palpitations 10/05/2016  . Cyst of right ovary 08/18/2016  . Adnexal mass 08/17/2016  . Bilateral carpal tunnel syndrome 04/08/2016  . Bilateral hand pain 04/08/2016  . 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 Acute onset aura migraine 07/10/2014  . Bad memory 07/10/2014  . Migraine without aura 07/10/2014  . Abdominal pain, epigastric 07/10/2014  . Chronic constipation 07/10/2014  . Weakness of left side of body 11/28/2011  . Acute anxiety 11/20/2009  . Anxiety and depression 11/20/2009  .  Edema of extremities 11/20/2009  . Hypercholesterolemia without hypertriglyceridemia 06/18/2009  . 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 Malaise and fatigue 10/29/2008  . Abnormal loss of weight 10/29/2008  . Mixed hyperlipidemia 08/22/2008  . 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 Affective bipolar disorder ([place]) 05/17/2008  . Barrett esophagus 05/17/2008    Past Surgical History:  Procedure Laterality Date  . ABDOMINAL HYSTERECTOMY    . APPENDECTOMY    . CARDIAC CATHETERIZATION     [facility] pt doesn't have any stents or recall years ago 20+ yrs ago  . CHOLECYSTECTOMY    . COLONOSCOPY WITH PROPOFOL N/A 07/16/2016   Procedure: COLONOSCOPY WITH PROPOFOL;  Surgeon: [name], MD;  Location: [facility] ENDOSCOPY;  Service: Endoscopy;  Laterality: N/A;  . ESOPHAGOGASTRODUODENOSCOPY (EGD) WITH PROPOFOL N/A 07/16/2016   Procedure: ESOPHAGOGASTRODUODENOSCOPY (EGD) WITH PROPOFOL;  Surgeon: [name], MD;  Location: [facility] ENDOSCOPY;  Service: Endoscopy;  Laterality: N/A;  . [place]  . LAPAROSCOPIC SALPINGO OOPHERECTOMY Bilateral 08/27/2016   Procedure: LAPAROSCOPIC SALPINGO OOPHORECTOMY;  Surgeon: Gae Dry, MD;  Location: [facility] ORS;  Service: Gynecology;  Laterality: Bilateral;  . TUBAL LIGATION  1983    Prior to Admission medications   Medication Sig Start Date End Date Taking? Authorizing Provider  albuterol (PROVENTIL HFA;VENTOLIN HFA) 108 (90 Base) MCG/ACT inhaler Inhale 2 puffs into the lungs every 6 (six) hours as needed for wheezing  or shortness of breath. 11/01/17   Chrismon, Vickki Muff, PA  ALPRAZolam (XANAX) 0.5 MG tablet Take 1 tablet (0.5 mg total) by mouth 3 (three) times daily as needed for anxiety. 11/19/16   Chrismon, Vickki Muff, PA  amoxicillin (AMOXIL) 875 MG tablet Take 1 tablet (875 mg total) by mouth 2 (two) times daily. 08/14/18   Chrismon, Vickki Muff, PA  Ascorbic Acid (VITAMIN C) 1000 MG tablet Take 1,000 mg by mouth daily.    [provider]   baclofen (LIORESAL) 10 MG tablet TAKE 1 TABLET (10 MG TOTAL) BY MOUTH 2 (TWO) TIMES DAILY 03/25/16   [provider]  budesonide-formoterol (SYMBICORT) 160-4.5 MCG/ACT inhaler Inhale 1 puff into the lungs 2 (two) times daily. 03/21/17   Chrismon, Vickki Muff, PA  gabapentin (NEURONTIN) 100 MG capsule Take 100 mg by mouth 3 (three) times daily. 100 mg am and noon, 200 mg dinner    [provider]  HYDROcodone-acetaminophen (NORCO/VICODIN) 5-325 MG tablet Take 1 tablet by mouth every 6 (six) hours as needed. For pain 09/27/17   Birdie Sons, MD  loratadine (CLARITIN) 10 MG tablet Take 10 mg by mouth 2 (two) times daily.    [provider]  pantoprazole (PROTONIX) 40 MG tablet TAKE 1 TABLET BY MOUTH ONCE DAILY 04/17/18   Chrismon, Vickki Muff, PA  Probiotic Product (PROBIOTIC-10) CHEW Chew 2 tablets by mouth daily.    [provider]  promethazine (PHENERGAN) 25 MG tablet Take 1 tablet (25 mg total) by mouth every 8 (eight) hours as needed for nausea or vomiting. 11/19/16   Chrismon, Vickki Muff, PA  QUEtiapine (SEROQUEL) 50 MG tablet Take 1 tablet (50 mg total) by mouth at bedtime. Patient taking differently: Take 75 mg by mouth at bedtime. Takes 1.5 tablets 04/19/16   Chrismon, Vickki Muff, PA  simvastatin (ZOCOR) 40 MG tablet TAKE 1 TABLET BY MOUTH AT BEDTIME 03/01/18   Mar Daring, PA-C  Specialty Vitamins Products (BIOTIN PLUS KERATIN) 10000-100 MCG-MG TABS Take 1 tablet by mouth daily.    [provider]     Allergies Diazepam, Ciprofloxacin, Codeine, Metoprolol, Morphine, Niacin and related, Topiramate, Toradol [ketorolac tromethamine], Doxycycline, and Iodinated diagnostic agents  Family History  Problem Relation Age of Onset  . Breast cancer Mother 50  . Skin cancer Mother   . Hypothyroidism Mother   . Parkinson's disease Mother   . Migraines Sister   . Colon cancer Father   . Lung cancer Father   . Heart disease Father   . Diabetes Paternal  Grandmother   . Hypertension Paternal Grandmother   . Stroke Paternal Grandmother   . Dementia Paternal Grandfather   . Breast cancer Maternal Aunt        post men.  . Diabetes Paternal Uncle   . Breast cancer Cousin 80       maternal   . Multiple myeloma Cousin     Social History Social History   Tobacco Use  . Smoking status: Current Every Day Smoker    Packs/day: 1.00    Years: 30.00    Pack years: 30.00    Types: Cigarettes  . Smokeless tobacco: Never Used  Substance Use Topics  . Alcohol use: Not Currently    Alcohol/week: 0.0 standard drinks  . Drug use: No    Review of Systems  Constitutional: No fever/chills Eyes: No visual changes.  ENT: No sore throat. Cardiovascular: Denies chest pain. Respiratory: Denies shortness of breath. Gastrointestinal: As above Genitourinary: Negative for  dysuria. Musculoskeletal: Negative for back pain. Skin: Negative for rash. Neurological: Negative for headaches    ____________________________________________   PHYSICAL EXAM:  VITAL SIGNS: ED Triage Vitals  Enc Vitals Group     BP 08/23/18 1628 110/69     Pulse Rate 08/23/18 1628 71     Resp 08/23/18 1628 16     Temp 08/23/18 1628 99.5 F (37.5 C)     Temp Source 08/23/18 1628 Oral     SpO2 08/23/18 1628 97 %     Weight 08/23/18 1633 42.2 kg (93 lb 1.6 oz)     Height 08/23/18 1633 1.524 m (5')     Head Circumference --      Peak Flow --      Pain Score 08/23/18 1633 7     Pain Loc --      Pain Edu? --      Excl. in [place]? --     Constitutional: Alert and oriented.  Eyes: Conjunctivae are normal.   Mouth/Throat: Mucous membranes are moist  Cardiovascular: Normal rate, regular rhythm. G  Good peripheral circulation. Respiratory: Normal respiratory effort.  No retractions. Gastrointestinal: Soft and nontender. No distention.  No CVA tenderness.  Reassuring exam  Musculoskeletal: No lower extremity tenderness nor edema.  Warm and well perfused Neurologic:   Normal speech and language. No gross focal neurologic deficits are appreciated.  Skin:  Skin is warm, dry and intact. No rash noted. Psychiatric: Mood and affect are normal. Speech and behavior are normal.  ____________________________________________   LABS (all labs ordered are listed, but only abnormal results are displayed)  Labs Reviewed  LIPASE, BLOOD  COMPREHENSIVE METABOLIC PANEL  CBC  URINALYSIS, COMPLETE (UACMP) WITH MICROSCOPIC   ____________________________________________  EKG  None ____________________________________________  RADIOLOGY  None ____________________________________________   PROCEDURES  Procedure(s) performed: No  Procedures   Critical Care performed: No ____________________________________________   INITIAL IMPRESSION / ASSESSMENT AND PLAN / ED COURSE  Pertinent labs & imaging results that were available during my care of the patient were reviewed by me and considered in my medical decision making (see chart for details).  Patient overall well-appearing and in no acute distress vital signs unremarkable, reassuring blood pressure and heart rate.  Lab work is unremarkable.  Stool panel was negative.  Will give IV fluids and reevaluate.  She does have excellent outpatient follow-up  Patient reports headache as well as requesting someone for that.  No improvement with Tylenol, she reports she can take Toradol as long she has Benadryl with it.   ----------------------------------------- 10:12 PM on 08/23/2018 -----------------------------------------  Patient reports she is feeling significantly better, she will discuss with Dr. Vicente Males in the morning regarding neck steps for her diarrhea    ____________________________________________   FINAL CLINICAL IMPRESSION(S) / ED DIAGNOSES  Final diagnoses:  Diarrhea, unspecified type        Note:  This document was prepared using Dragon voice recognition software and may include  unintentional dictation errors.   Lavonia Drafts, MD 08/23/18 2212

## 2018-08-23 NOTE — Telephone Encounter (Signed)
Called pt husbnad back for Ginger to inform pt that  He should take pt to ER for IV fluids he will be taking her and understands the instructions from Dr. Vicente Males

## 2018-08-23 NOTE — ED Triage Notes (Addendum)
Patient c/o diarrhea and emesis 08/11/18 and saw PCP the following Monday. They told her to follow up with GI MD. Patient saw GI and was told to take imodium. Patient also gave GI a stool sample due to diarrhea continuing but has not gotten results back yet. After reviewing our results patient stool sample came back negative for everything. Patient now reports she cannot keep liquids or solids down and was sent by PCP for fluids.

## 2018-08-23 NOTE — Telephone Encounter (Signed)
If patient is unable to keep anything down , will need to come to er to get iv fluids

## 2018-08-28 ENCOUNTER — Other Ambulatory Visit: Payer: Self-pay

## 2018-08-28 ENCOUNTER — Other Ambulatory Visit
Admission: RE | Admit: 2018-08-28 | Discharge: 2018-08-28 | Disposition: A | Discharge status: Home / Self Care | Payer: BC Managed Care – PPO | Source: Ambulatory Visit | Attending: Gastroenterology | Admitting: Gastroenterology

## 2018-08-28 ENCOUNTER — Ambulatory Visit
Admission: RE | Admit: 2018-08-28 | Discharge: 2018-08-28 | Disposition: A | Discharge status: Home / Self Care | Payer: BC Managed Care – PPO | Source: Ambulatory Visit | Attending: Gastroenterology | Admitting: Gastroenterology

## 2018-08-28 ENCOUNTER — Ambulatory Visit: Payer: BC Managed Care – PPO | Admitting: Gastroenterology

## 2018-08-28 DIAGNOSIS — R197 Diarrhea, unspecified: Secondary | ICD-10-CM

## 2018-08-28 LAB — TSH: TSH: 1.301 u[IU]/mL (ref 0.350–4.500)

## 2018-08-28 NOTE — Progress Notes (Signed)
[name] MD, MRCP(U.K) [address]  [place]  [place], [place] [address]  Main: [phone]  Fax: [phone]   Primary Care Physician: Margo Common, [place]  Primary Gastroenterologist:  Dr. [name]   Follow-up to ER visit  HPI: Jordan Ortega is a 63 y.o. female    She was last seen in my office in July 2018 for diarrhea.  Initial visit on 07/14/16 when she presented for diarrhea of 6 weeks duration.  At that time she had diarrhea once very 2-3 days with some blood, when she wipes on top of the stool. Initially hard "balls" then very watery. At times when she had a bowel movement hurts but only passes mucus. Father had colon cancer, prior history of colon polyps when she eats has lower abdominal pain , usually begins in an hour, lasts for about a few hours.   EGD 07/2016- endoscopically gastritis seen. small bowel bx - normal , gastric bx -moderate gastropathy  Colonoscopy 07/2016 - hyperplastic polyp, random colon bx negative for colitis. Medium sized internal hemorroids seen.   Interval history 08/02/2016 -08/28/2018  At the last visit she was being evaluated for unintentional weight loss, and was found to have a 7 x 6 cm cystic mass in the cul-de-sac.  Follow-up pelvic ultrasound are suspicious for an ovarian neoplasm.  He was seen by bedside OB/GYN, underwent bilateral oophorectomy.  Pathology showed a mucinous cystadenoma.   She called our office on 08/16/2018 for severe diarrhea and weight loss.  There was a call made to her suggesting she was dehydrated and she was referred to the ER.  Labs at the ER on 08/23/2018 demonstrated a hemoglobin of 13.3 g normal white cell count and platelet count.  CMP was normal.  Lipase is normal.  Stool C. difficile and GI PCR were negative.  She states that she has been having diarrhea after every meal since 2018.  No change.  Nonbloody.  Today she weighs 95 pounds which is same as what she weighed at her last office visit but  she says in between she had gained weight and subsequently has lost weight.  She denies any artificial sweeteners in her diet.  No diet sodas either.   Current Outpatient Medications  Medication Sig Dispense Refill  . albuterol (PROVENTIL HFA;VENTOLIN HFA) 108 (90 Base) MCG/ACT inhaler Inhale 2 puffs into the lungs every 6 (six) hours as needed for wheezing or shortness of breath. 1 Inhaler 2  . ALPRAZolam (XANAX) 0.5 MG tablet Take 1 tablet (0.5 mg total) by mouth 3 (three) times daily as needed for anxiety. 90 tablet 0  . amoxicillin (AMOXIL) 875 MG tablet Take 1 tablet (875 mg total) by mouth 2 (two) times daily. 20 tablet 0  . Ascorbic Acid (VITAMIN C) 1000 MG tablet Take 1,000 mg by mouth daily.    . baclofen (LIORESAL) 10 MG tablet TAKE 1 TABLET (10 MG TOTAL) BY MOUTH 2 (TWO) TIMES DAILY  2  . budesonide-formoterol (SYMBICORT) 160-4.5 MCG/ACT inhaler Inhale 1 puff into the lungs 2 (two) times daily. 1 Inhaler 0  . gabapentin (NEURONTIN) 100 MG capsule Take 100 mg by mouth 3 (three) times daily. 100 mg am and noon, 200 mg dinner    . HYDROcodone-acetaminophen (NORCO/VICODIN) 5-325 MG tablet Take 1 tablet by mouth every 6 (six) hours as needed. For pain 30 tablet 0  . loratadine (CLARITIN) 10 MG tablet Take 10 mg by mouth 2 (two) times daily.    . pantoprazole (  PROTONIX) 40 MG tablet TAKE 1 TABLET BY MOUTH ONCE DAILY 90 tablet 1  . Probiotic Product (PROBIOTIC-10) CHEW Chew 2 tablets by mouth daily.    . promethazine (PHENERGAN) 25 MG tablet Take 1 tablet (25 mg total) by mouth every 8 (eight) hours as needed for nausea or vomiting. 30 tablet 0  . QUEtiapine (SEROQUEL) 50 MG tablet Take 1 tablet (50 mg total) by mouth at bedtime. (Patient taking differently: Take 75 mg by mouth at bedtime. Takes 1.5 tablets) 90 tablet 3  . simvastatin (ZOCOR) 40 MG tablet TAKE 1 TABLET BY MOUTH AT BEDTIME 30 tablet 5  . Specialty Vitamins Products (BIOTIN PLUS KERATIN) 10000-100 MCG-MG TABS Take 1 tablet by  mouth daily.     No current facility-administered medications for this visit.     Allergies as of 08/28/2018 - Review Complete 08/23/2018  Allergen Reaction Noted  . Diazepam Other (See Comments) 11/20/2009  . Ciprofloxacin Other (See Comments) 06/21/2013  . Codeine Hives and Itching 11/20/2009  . Metoprolol  07/10/2014  . Morphine Hives 11/20/2009  . Niacin and related Other (See Comments) 11/28/2011  . Topiramate Other (See Comments) 11/01/2017  . Toradol [ketorolac tromethamine] Itching 07/10/2014  . Doxycycline Diarrhea 03/21/2018  . Iodinated diagnostic agents Hives 07/11/2014    ROS:  General: Negative for anorexia, weight loss, fever, chills, fatigue, weakness. ENT: Negative for hoarseness, difficulty swallowing , nasal congestion. CV: Negative for chest pain, angina, palpitations, dyspnea on exertion, peripheral edema.  Respiratory: Negative for dyspnea at rest, dyspnea on exertion, cough, sputum, wheezing.  GI: See history of present illness. GU:  Negative for dysuria, hematuria, urinary incontinence, urinary frequency, nocturnal urination.  Endo: Negative for unusual weight change.    Physical Examination:   There were no vitals taken for this visit.  General: Well-nourished, well-developed in no acute distress.  Eyes: No icterus. Conjunctivae pink. Mouth: Oropharyngeal mucosa moist and pink , no lesions erythema or exudate. Lungs: Clear to auscultation bilaterally. Non-labored. Heart: Regular rate and rhythm, no murmurs rubs or gallops.  Abdomen: Bowel sounds are normal, nontender, nondistended, no hepatosplenomegaly or masses, no abdominal bruits or hernia , no rebound or guarding.   Extremities: No lower extremity edema. No clubbing or deformities. Neuro: Alert and oriented x 3.  Grossly intact. Skin: Warm and dry, no jaundice.   Psych: Alert and cooperative, normal mood and affect.   Imaging Studies: No results found.  Assessment and Plan:   Jordan Ortega is a 63 y.o. y/o female here to see me after ER visit for diarrhea.  Based on her history the diarrhea is the same that she was seen in 2018.  At that point of time I was concerned about constipation with overflow diarrhea.  She did not follow-up subsequently.  There is no evidence of infection in her stool test.  Differentials include irritable bowel syndrome mixed versus diarrhea predominant.  Weight has been stable per my records but she mentions that she had gained weight in the interim.  Plan  1.  Check TSH 2.  Trial of Creon, fiber supplements.  If no better will try a course of Xifaxan for IBS D. 3.  If continues to lose weight will need repeat CT scan of the chest and abdomen and pelvis.   Dr [name]  MD,MRCP [facility]) Follow up in 2 weeks

## 2018-08-29 ENCOUNTER — Encounter: Payer: Self-pay | Admitting: Gastroenterology

## 2018-08-29 NOTE — Progress Notes (Signed)
Dont think we gave her samples rather she was supposed to take miralax daily

## 2018-09-05 ENCOUNTER — Telehealth: Payer: Self-pay | Admitting: Gastroenterology

## 2018-09-05 ENCOUNTER — Other Ambulatory Visit: Payer: Self-pay | Admitting: Physician Assistant

## 2018-09-05 NOTE — Telephone Encounter (Signed)
Jadijah inform 2 options  1. Stop CREON and just go on metamucil TID - if no better then we can prescribe CREON 2.  Option 2 send script for CREON- likely long term use will be needed- this helps boost the function of the pancreas by adding ezymes with the tablets   Am ok with either option

## 2018-09-05 NOTE — Telephone Encounter (Signed)
Patient called in stating she was given samples of Creon & they are working. She would like them called into [facility]. She would also like to know how long she needs to take them?

## 2018-09-06 ENCOUNTER — Ambulatory Visit: Payer: BC Managed Care – PPO | Admitting: Gastroenterology

## 2018-09-06 ENCOUNTER — Other Ambulatory Visit: Payer: Self-pay

## 2018-09-06 MED ORDER — PANCRELIPASE (LIP-PROT-AMYL) 36000-114000 UNITS PO CPEP: ORAL_CAPSULE | ORAL | 3 refills | Status: DC

## 2018-09-06 NOTE — Telephone Encounter (Signed)
Spoke with pt and informed her of Dr. Georgeann Oppenheim suggestions. Pt states she'd rather proceed with the prescription of Creon. Script has been sent to pt preferred pharmacy.

## 2018-09-12 ENCOUNTER — Other Ambulatory Visit: Payer: Self-pay

## 2018-09-12 ENCOUNTER — Ambulatory Visit: Payer: BC Managed Care – PPO | Admitting: Gastroenterology

## 2018-09-12 VITALS — BP 105/67 | HR 66 | Temp 98.3°F | Ht 60.0 in | Wt 95.6 lb

## 2018-09-12 DIAGNOSIS — R634 Abnormal weight loss: Secondary | ICD-10-CM

## 2018-09-12 DIAGNOSIS — K582 Mixed irritable bowel syndrome: Secondary | ICD-10-CM

## 2018-09-12 NOTE — Progress Notes (Signed)
[name] MD, MRCP(U.K) [address]  [place], [place] [address]  Main: [phone]  Fax: [phone]   Primary Care Physician: Margo Common, [place]  Primary Gastroenterologist:  Dr. [name]   Diarrhea follow-up  HPI: Jordan Ortega is a 63 y.o. female   Summary of history :  She was seen in my office on 08/28/2018.  Initially seen in June 2018 when she presented for diarrhea of 6 weeks duration.  At that time she had diarrhea once very 2-3 days with some blood, when she wipes on top of the stool. Initially hard "balls" then very watery. At times when she had a bowel movement hurts but only passes mucus. Father had colon cancer, prior history of colon polyps when she eats has lower abdominal pain , usually begins in an hour, lasts for about a few hours.   EGD 07/2016- endoscopically gastritis seen. small bowel bx - normal , gastric bx -moderate gastropathy  Colonoscopy 07/2016 - hyperplastic polyp, random colon bx negative for colitis. Medium sized internal hemorroids seen.  Previously well being evaluated for unintentional weight losswas found to have a 7 x 6 cm cystic mass in the cul-de-sac.  Follow-up pelvic ultrasound are suspicious for an ovarian neoplasm.  He was seen by bedside OB/GYN, underwent bilateral oophorectomy.  Pathology showed a mucinous cystadenoma.  In July 2020 she called our office with severe diarrhea and weight loss. Stool C. difficile and GI PCR were negative.  She states that she has been having diarrhea after every meal since 2018.  No change.  Nonbloody.    At her office visit in July 2020 she weighed 95 pounds which was the same when she was seen at our office a year or 2 prior.  He does state that in between she had gained weight and subsequently lost She denies any artificial sweeteners in her diet.  No diet sodas either.  Interval history   08/28/2018-09/12/2018  08/28/2018: X-ray of the abdomen showed moderate stool volume  noted throughout the colon.   She started taking the Creon and says since then she has had no diarrhea.  I also suggested to her at the last visit to start taking a stool softener because I suspect that she may be having a constipation with overflow diarrhea.  She has been taking the stool softener and has a bowel movement every day or every other day.  Her main complaint today is that she is does not have an appetite and she is further losing weight.  She says that at her home scale she has lost a few more pounds.  Feels nauseous and not able to keep any food down.  Current Outpatient Medications  Medication Sig Dispense Refill  . albuterol (PROVENTIL HFA;VENTOLIN HFA) 108 (90 Base) MCG/ACT inhaler Inhale 2 puffs into the lungs every 6 (six) hours as needed for wheezing or shortness of breath. 1 Inhaler 2  . ALPRAZolam (XANAX) 0.5 MG tablet Take 1 tablet (0.5 mg total) by mouth 3 (three) times daily as needed for anxiety. 90 tablet 0  . amoxicillin (AMOXIL) 875 MG tablet Take 1 tablet (875 mg total) by mouth 2 (two) times daily. 20 tablet 0  . Ascorbic Acid (VITAMIN C) 1000 MG tablet Take 1,000 mg by mouth daily.    . baclofen (LIORESAL) 10 MG tablet TAKE 1 TABLET (10 MG TOTAL) BY MOUTH 2 (TWO) TIMES DAILY  2  . budesonide-formoterol (SYMBICORT) 160-4.5 MCG/ACT inhaler Inhale 1 puff into  the lungs 2 (two) times daily. 1 Inhaler 0  . gabapentin (NEURONTIN) 100 MG capsule Take 100 mg by mouth 3 (three) times daily. 100 mg am and noon, 200 mg dinner    . HYDROcodone-acetaminophen (NORCO/VICODIN) 5-325 MG tablet Take 1 tablet by mouth every 6 (six) hours as needed. For pain 30 tablet 0  . lipase/protease/amylase (CREON) 36000 UNITS CPEP capsule Take 2 caps with each meal and 1 with a snack 210 capsule 3  . loratadine (CLARITIN) 10 MG tablet Take 10 mg by mouth 2 (two) times daily.    . pantoprazole (PROTONIX) 40 MG tablet TAKE 1 TABLET BY MOUTH ONCE DAILY 90 tablet 1  . Probiotic Product  (PROBIOTIC-10) CHEW Chew 2 tablets by mouth daily.    . promethazine (PHENERGAN) 25 MG tablet Take 1 tablet (25 mg total) by mouth every 8 (eight) hours as needed for nausea or vomiting. 30 tablet 0  . QUEtiapine (SEROQUEL) 50 MG tablet Take 1 tablet (50 mg total) by mouth at bedtime. (Patient taking differently: Take 75 mg by mouth at bedtime. Takes 1.5 tablets) 90 tablet 3  . simvastatin (ZOCOR) 40 MG tablet TAKE 1 TABLET BY MOUTH AT BEDTIME 30 tablet 5  . Specialty Vitamins Products (BIOTIN PLUS KERATIN) 10000-100 MCG-MG TABS Take 1 tablet by mouth daily.     No current facility-administered medications for this visit.     Allergies as of 09/12/2018 - Review Complete 08/23/2018  Allergen Reaction Noted  . Diazepam Other (See Comments) 11/20/2009  . Ciprofloxacin Other (See Comments) 06/21/2013  . Codeine Hives and Itching 11/20/2009  . Metoprolol  07/10/2014  . Morphine Hives 11/20/2009  . Niacin and related Other (See Comments) 11/28/2011  . Topiramate Other (See Comments) 11/01/2017  . Toradol [ketorolac tromethamine] Itching 07/10/2014  . Doxycycline Diarrhea 03/21/2018  . Iodinated diagnostic agents Hives 07/11/2014    ROS:  General: Negative for anorexia, weight loss, fever, chills, fatigue, weakness. ENT: Negative for hoarseness, difficulty swallowing , nasal congestion. CV: Negative for chest pain, angina, palpitations, dyspnea on exertion, peripheral edema.  Respiratory: Negative for dyspnea at rest, dyspnea on exertion, cough, sputum, wheezing.  GI: See history of present illness. GU:  Negative for dysuria, hematuria, urinary incontinence, urinary frequency, nocturnal urination.  Endo: Negative for unusual weight change.    Physical Examination:   There were no vitals taken for this visit.  General: Well-nourished, well-developed in no acute distress.  Eyes: No icterus. Conjunctivae pink. Mouth: Oropharyngeal mucosa moist and pink , no lesions erythema or exudate.  Lungs: Clear to auscultation bilaterally. Non-labored. Heart: Regular rate and rhythm, no murmurs rubs or gallops.  Abdomen: Bowel sounds are normal, nontender, nondistended, no hepatosplenomegaly or masses, no abdominal bruits or hernia , no rebound or guarding.   Extremities: No lower extremity edema. No clubbing or deformities. Neuro: Alert and oriented x 3.  Grossly intact. Skin: Warm and dry, no jaundice.   Psych: Alert and cooperative, normal mood and affect.   Imaging Studies: Dg Abd 1 View  Result Date: 08/29/2018 CLINICAL DATA:  Diarrhea.  Constipation. EXAM: ABDOMEN - 1 VIEW COMPARISON:  CT 08/09/2016.  Abdomen 07/14/2016. FINDINGS: Surgical clips right upper quadrant. No bowel distention. Moderate amount of stool noted throughout the colon. Degenerative change lumbar spine scoliosis concave left. No acute bony abnormality. IMPRESSION: No acute abnormality identified. Moderate stool volume noted throughout the colon. Electronically Signed   By: Marcello Moores  Register   On: 08/29/2018 09:04    Assessment and Plan:  Jordan Ortega is a 63 y.o. y/o female here to see me for a follow-up for diarrhea.  It is very likely she has constipation with overflow diarrhea.  She has benefited from Creon and stool softeners.  Her main concern is weight loss unintentional and inability to keep food down in terms of nausea and loss of appetite.  She is very concerned about her weight loss and I suggested she has had endoscopies in the past 2 years and probably the Only option I have is to obtain a CT scan of the chest abdomen and pelvis.  She says that she is allergic to contrast and hence can be performed without contrast IV.   Dr [name]  MD,MRCP [facility]) Follow up in 4 weeks

## 2018-09-13 ENCOUNTER — Encounter: Payer: Self-pay | Admitting: Physician Assistant

## 2018-09-13 ENCOUNTER — Ambulatory Visit: Payer: BC Managed Care – PPO | Admitting: Physician Assistant

## 2018-09-13 VITALS — BP 95/59 | HR 60 | Temp 98.4°F | Resp 16 | Wt 95.0 lb

## 2018-09-13 DIAGNOSIS — N952 Postmenopausal atrophic vaginitis: Secondary | ICD-10-CM | POA: Diagnosis not present

## 2018-09-13 DIAGNOSIS — G43411 Hemiplegic migraine, intractable, with status migrainosus: Secondary | ICD-10-CM | POA: Diagnosis not present

## 2018-09-13 MED ORDER — PROMETHAZINE HCL 25 MG/ML IJ SOLN
25.0000 mg | Freq: Once | INTRAMUSCULAR | Status: AC
  Administered 2018-09-13: 25 mg via INTRAMUSCULAR

## 2018-09-13 MED ORDER — DIPHENHYDRAMINE HCL 25 MG PO CAPS
25.0000 mg | ORAL_CAPSULE | Freq: Once | ORAL | Status: AC
  Administered 2018-09-13: 25 mg via ORAL

## 2018-09-13 MED ORDER — KETOROLAC TROMETHAMINE 60 MG/2ML IM SOLN
30.0000 mg | Freq: Once | INTRAMUSCULAR | Status: AC
  Administered 2018-09-13: 30 mg via INTRAMUSCULAR

## 2018-09-13 NOTE — Progress Notes (Signed)
[name] Female    DOB: 08-28-1955   63 y.o.   MRN: [id number] Visit Date: 09/13/2018  Today's Provider: Mar Daring, PA-C   Chief Complaint  Patient presents with  . Migraine   Subjective:     HPI  Patient here with Migraine headache that started on Sunday. Reports is worsening. She is having nausea. Sensitive to light and sound. It hurts in her temporal area radiating to her face. She has taken Hydrocodone-acetaminophen and phenergan.   Allergies  Allergen Reactions  . Diazepam Other (See Comments)    Other reaction(s): Hallucination Patient states "it makes me crazy" REACTION: Behavioral problems  . Ciprofloxacin Other (See Comments)    Other reaction(s): Unknown Loopy  . Codeine Hives and Itching    REACTION: Dizziness  . Metoprolol     Migraine/headaches   . Morphine Hives    Other reaction(s): Unknown  . Niacin And Related Other (See Comments)    flushing  . Topiramate Other (See Comments)  . Toradol [Ketorolac Tromethamine] Itching    Tolerates when taken with Benadryl  . Doxycycline Diarrhea  . Iodinated Diagnostic Agents Hives    Pt states she had a CT Head with contrast in the 90's and broke out in hives after the injection. SPM     Current Outpatient Medications:  .  albuterol (PROVENTIL HFA;VENTOLIN HFA) 108 (90 Base) MCG/ACT inhaler, Inhale 2 puffs into the lungs every 6 (six) hours as needed for wheezing or shortness of breath., Disp: 1 Inhaler, Rfl: 2 .  ALPRAZolam (XANAX) 0.5 MG tablet, Take 1 tablet (0.5 mg total) by mouth 3 (three) times daily as needed for anxiety., Disp: 90 tablet, Rfl: 0 .  Ascorbic Acid (VITAMIN C) 1000 MG tablet, Take 1,000 mg by mouth daily., Disp: , Rfl:  .  baclofen (LIORESAL) 10 MG tablet, TAKE 1 TABLET (10 MG TOTAL) BY MOUTH 2 (TWO) TIMES DAILY, Disp: , Rfl: 2 .  budesonide-formoterol (SYMBICORT) 160-4.5 MCG/ACT inhaler, Inhale 1 puff into the lungs 2 (two) times daily., Disp: 1 Inhaler,  Rfl: 0 .  DULoxetine (CYMBALTA) 20 MG capsule, Take by mouth., Disp: , Rfl:  .  gabapentin (NEURONTIN) 100 MG capsule, Take 100 mg by mouth 3 (three) times daily. 100 mg am and noon, 200 mg dinner, Disp: , Rfl:  .  HYDROcodone-acetaminophen (NORCO/VICODIN) 5-325 MG tablet, Take 1 tablet by mouth every 6 (six) hours as needed. For pain, Disp: 30 tablet, Rfl: 0 .  lipase/protease/amylase (CREON) 36000 UNITS CPEP capsule, Take 2 caps with each meal and 1 with a snack, Disp: 210 capsule, Rfl: 3 .  loratadine (CLARITIN) 10 MG tablet, Take 10 mg by mouth 2 (two) times daily., Disp: , Rfl:  .  pantoprazole (PROTONIX) 40 MG tablet, TAKE 1 TABLET BY MOUTH ONCE DAILY, Disp: 90 tablet, Rfl: 1 .  Probiotic Product (PROBIOTIC-10) CHEW, Chew 2 tablets by mouth daily., Disp: , Rfl:  .  promethazine (PHENERGAN) 25 MG tablet, Take 1 tablet (25 mg total) by mouth every 8 (eight) hours as needed for nausea or vomiting., Disp: 30 tablet, Rfl: 0 .  QUEtiapine (SEROQUEL) 50 MG tablet, Take 1 tablet (50 mg total) by mouth at bedtime. (Patient taking differently: Take 75 mg by mouth at bedtime. Takes 1.5 tablets), Disp: 90 tablet, Rfl: 3 .  simvastatin (ZOCOR) 40 MG tablet, TAKE 1 TABLET BY MOUTH AT BEDTIME, Disp: 30 tablet, Rfl: 5 .  Specialty Vitamins Products (BIOTIN PLUS KERATIN) 10000-100  MCG-MG TABS, Take 1 tablet by mouth daily., Disp: , Rfl:   Review of Systems  Constitutional: Negative.   Eyes: Positive for photophobia and visual disturbance.  Respiratory: Negative.   Cardiovascular: Negative.   Gastrointestinal: Positive for nausea.  Genitourinary: Positive for dyspareunia.       Vaginal dryness  Neurological: Positive for facial asymmetry and headaches.    Social History   Tobacco Use  . Smoking status: Current Every Day Smoker    Packs/day: 1.00    Years: 30.00    Pack years: 30.00    Types: Cigarettes  . Smokeless tobacco: Never Used  Substance Use Topics  . Alcohol use: Not Currently     Alcohol/week: 0.0 standard drinks      Objective:   BP (!) 95/59 (BP Location: Right Arm, Patient Position: Sitting, Cuff Size: Normal)   Pulse 60   Temp 98.4 F (36.9 C) (Oral)   Resp 16   Wt 95 lb (43.1 kg)   BMI 18.55 kg/m  Vitals:   09/13/18 1113  BP: (!) 95/59  Pulse: 60  Resp: 16  Temp: 98.4 F (36.9 C)  TempSrc: Oral  Weight: 95 lb (43.1 kg)     Physical Exam Vitals signs reviewed.  Constitutional:      General: She is not in acute distress.    Appearance: Normal appearance. She is well-developed and normal weight. She is ill-appearing.  HENT:     Head: Normocephalic and atraumatic.  Neck:     Musculoskeletal: Normal range of motion and neck supple.  Pulmonary:     Effort: Pulmonary effort is normal. No respiratory distress.  Neurological:     Mental Status: She is alert.     Comments: Left side facial droop c/w her hemiplegic migraines  Psychiatric:        Mood and Affect: Mood normal.        Behavior: Behavior normal.        Thought Content: Thought content normal.        Judgment: Judgment normal.      No results found for any visits on 09/13/18.     Assessment & Plan    1. Intractable hemiplegic migraine with status migrainosus Failed home medications. Will give Toradol, phenergan and benadryl as below. Continue to f/u with Neurology, Dr. Manuella Ghazi. She is on Aimovig, but may need to make them aware she has had 2 severe breakthrough migraines over the last 2 months, both requiring injectable abortive medications. She agrees. Call if worsening or not improving.  - ketorolac (TORADOL) injection 30 mg - diphenhydrAMINE (BENADRYL) capsule 25 mg - promethazine (PHENERGAN) injection 25 mg  2. Vaginal atrophy Worsening. Having hard time with intercourse with her husband. Has had hysterectomy many years ago. Sample of Premarin cream given to patient today in the office. Advised to call if not improving and may consider oral HRT. Patient was advised of  increased risk of breast cancer and CVD issues with oral estrogen therapy. If topical works we will prescribe for her.     Mar Daring, PA-C  [facility]

## 2018-09-19 ENCOUNTER — Telehealth: Payer: Self-pay

## 2018-09-19 NOTE — Telephone Encounter (Signed)
Pt left vm to speak with Dejiah  About her CT scan date please call pt

## 2018-09-19 NOTE — Telephone Encounter (Signed)
Spoke with pt and was able to reschedule CT scan to 09-27-18.

## 2018-09-19 NOTE — Telephone Encounter (Signed)
Spoke with pt and informed her that her insurance has not yet responded to the pre-cert for approval of the CT scan. I explained that we will need to reschedule the CT scan to a later date to allow more time for her insurance to respond. Pt understands, and states she'll call back in an hour to inform me of her availability.

## 2018-09-20 ENCOUNTER — Ambulatory Visit: Admission: RE | Admit: 2018-09-20 | Payer: BC Managed Care – PPO | Source: Ambulatory Visit

## 2018-09-26 ENCOUNTER — Other Ambulatory Visit: Payer: Self-pay

## 2018-09-26 ENCOUNTER — Telehealth: Payer: Self-pay

## 2018-09-26 DIAGNOSIS — R634 Abnormal weight loss: Secondary | ICD-10-CM

## 2018-09-26 NOTE — Telephone Encounter (Signed)
Spoke with pt and informed her that her insurance denied coverage for the CT scans. I explained that Dr. Vicente Males recommends a referral to oncology for further evaluation. Pt understands and agrees to the referral.

## 2018-09-27 ENCOUNTER — Ambulatory Visit: Payer: BC Managed Care – PPO

## 2018-10-03 ENCOUNTER — Encounter: Payer: Self-pay | Admitting: Oncology

## 2018-10-03 ENCOUNTER — Inpatient Hospital Stay: Payer: BC Managed Care – PPO

## 2018-10-03 ENCOUNTER — Other Ambulatory Visit: Payer: Self-pay

## 2018-10-03 ENCOUNTER — Inpatient Hospital Stay: Payer: BC Managed Care – PPO | Attending: Oncology | Admitting: Oncology

## 2018-10-03 VITALS — BP 109/74 | HR 68 | Temp 98.9°F | Resp 20 | Ht 60.0 in | Wt 92.1 lb

## 2018-10-03 DIAGNOSIS — R918 Other nonspecific abnormal finding of lung field: Secondary | ICD-10-CM | POA: Insufficient documentation

## 2018-10-03 DIAGNOSIS — Z807 Family history of other malignant neoplasms of lymphoid, hematopoietic and related tissues: Secondary | ICD-10-CM | POA: Diagnosis not present

## 2018-10-03 DIAGNOSIS — R634 Abnormal weight loss: Secondary | ICD-10-CM | POA: Diagnosis present

## 2018-10-03 DIAGNOSIS — Z800 Family history of malignant neoplasm of digestive organs: Secondary | ICD-10-CM | POA: Insufficient documentation

## 2018-10-03 DIAGNOSIS — Z801 Family history of malignant neoplasm of trachea, bronchus and lung: Secondary | ICD-10-CM | POA: Insufficient documentation

## 2018-10-03 DIAGNOSIS — Z808 Family history of malignant neoplasm of other organs or systems: Secondary | ICD-10-CM | POA: Insufficient documentation

## 2018-10-03 DIAGNOSIS — Z720 Tobacco use: Secondary | ICD-10-CM

## 2018-10-03 DIAGNOSIS — F17210 Nicotine dependence, cigarettes, uncomplicated: Secondary | ICD-10-CM | POA: Insufficient documentation

## 2018-10-03 DIAGNOSIS — Z87891 Personal history of nicotine dependence: Secondary | ICD-10-CM | POA: Insufficient documentation

## 2018-10-03 DIAGNOSIS — Z803 Family history of malignant neoplasm of breast: Secondary | ICD-10-CM | POA: Diagnosis not present

## 2018-10-03 DIAGNOSIS — R948 Abnormal results of function studies of other organs and systems: Secondary | ICD-10-CM | POA: Insufficient documentation

## 2018-10-03 DIAGNOSIS — R109 Unspecified abdominal pain: Secondary | ICD-10-CM | POA: Insufficient documentation

## 2018-10-03 DIAGNOSIS — J432 Centrilobular emphysema: Secondary | ICD-10-CM | POA: Diagnosis not present

## 2018-10-03 LAB — CBC WITH DIFFERENTIAL/PLATELET
Abs Immature Granulocytes: 0.01 10*3/uL (ref 0.00–0.07)
Basophils Absolute: 0.1 10*3/uL (ref 0.0–0.1)
Basophils Relative: 1 %
Eosinophils Absolute: 0.1 10*3/uL (ref 0.0–0.5)
Eosinophils Relative: 2 %
HCT: 40.9 % (ref 36.0–46.0)
Hemoglobin: 13.9 g/dL (ref 12.0–15.0)
Immature Granulocytes: 0 %
Lymphocytes Relative: 37 %
Lymphs Abs: 2.1 10*3/uL (ref 0.7–4.0)
MCH: 32.6 pg (ref 26.0–34.0)
MCHC: 34 g/dL (ref 30.0–36.0)
MCV: 96 fL (ref 80.0–100.0)
Monocytes Absolute: 0.4 10*3/uL (ref 0.1–1.0)
Monocytes Relative: 7 %
Neutro Abs: 2.9 10*3/uL (ref 1.7–7.7)
Neutrophils Relative %: 53 %
Platelets: 246 10*3/uL (ref 150–400)
RBC: 4.26 MIL/uL (ref 3.87–5.11)
RDW: 13 % (ref 11.5–15.5)
WBC: 5.7 10*3/uL (ref 4.0–10.5)
nRBC: 0 % (ref 0.0–0.2)

## 2018-10-03 LAB — TECHNOLOGIST SMEAR REVIEW: Plt Morphology: ADEQUATE

## 2018-10-03 LAB — LACTATE DEHYDROGENASE: LDH: 139 U/L (ref 98–192)

## 2018-10-03 NOTE — Progress Notes (Signed)
Here today for initial visti for weight loss. Attempted CT by GI provider denied by insurance twice. Reports weight was 120 in January and 114 in April. Today weight is 92.1lb

## 2018-10-04 ENCOUNTER — Other Ambulatory Visit: Payer: Self-pay

## 2018-10-04 ENCOUNTER — Other Ambulatory Visit: Payer: Self-pay | Admitting: Physician Assistant

## 2018-10-04 ENCOUNTER — Encounter: Payer: Self-pay | Admitting: Oncology

## 2018-10-04 ENCOUNTER — Telehealth: Payer: Self-pay | Admitting: *Deleted

## 2018-10-04 DIAGNOSIS — Z1231 Encounter for screening mammogram for malignant neoplasm of breast: Secondary | ICD-10-CM

## 2018-10-04 DIAGNOSIS — Z122 Encounter for screening for malignant neoplasm of respiratory organs: Secondary | ICD-10-CM

## 2018-10-04 DIAGNOSIS — Z87891 Personal history of nicotine dependence: Secondary | ICD-10-CM

## 2018-10-04 DIAGNOSIS — R634 Abnormal weight loss: Secondary | ICD-10-CM

## 2018-10-04 LAB — MULTIPLE MYELOMA PANEL, SERUM
Albumin SerPl Elph-Mcnc: 4.5 g/dL — ABNORMAL HIGH (ref 2.9–4.4)
Albumin/Glob SerPl: 2.1 — ABNORMAL HIGH (ref 0.7–1.7)
Alpha 1: 0.2 g/dL (ref 0.0–0.4)
Alpha2 Glob SerPl Elph-Mcnc: 0.7 g/dL (ref 0.4–1.0)
B-Globulin SerPl Elph-Mcnc: 0.7 g/dL (ref 0.7–1.3)
Gamma Glob SerPl Elph-Mcnc: 0.5 g/dL (ref 0.4–1.8)
Globulin, Total: 2.2 g/dL (ref 2.2–3.9)
IgA: 57 mg/dL — ABNORMAL LOW (ref 87–352)
IgG (Immunoglobin G), Serum: 675 mg/dL (ref 586–1602)
IgM (Immunoglobulin M), Srm: 57 mg/dL (ref 26–217)
Total Protein ELP: 6.7 g/dL (ref 6.0–8.5)

## 2018-10-04 LAB — KAPPA/LAMBDA LIGHT CHAINS
Kappa free light chain: 18.2 mg/L (ref 3.3–19.4)
Kappa, lambda light chain ratio: 1.52 (ref 0.26–1.65)
Lambda free light chains: 12 mg/L (ref 5.7–26.3)

## 2018-10-04 NOTE — Progress Notes (Signed)
Hematology/Oncology Consult note [facility] Telephone:([phone] [phone]   Patient Care Team: Rubye Beach as PCP - General (Family Medicine)  REFERRING PROVIDER: [name], MD  CHIEF COMPLAINTS/REASON FOR VISIT:  Evaluation of unintentional weight loss.  HISTORY OF PRESENTING ILLNESS:   Jordan Ortega is a  63 y.o.  female with PMH listed below was seen in consultation at the request of  [name], MD  for evaluation of unintentional weight loss Patient reports that she was in her usual state of health until January 2020 when she started to have unintentional weight loss. Her weight was 113 pound on 12/05/2017 when she was seen by cardiology Dr.: Christia Reading for arm pain  Weight was 104 pound on 03/13/2018 when she saw primary care provider With further decreased to 100 Ib on 05/30/2018 at PCPs office. 08/14/2018 97 Ib, at primary care physician's office.  She complained having diarrhea at that time.  She went to emergency room for evaluation of dehydration.  Their blood work showed C. difficile and a GI PCR were negative.  Normal CMP, lipase was normal.  Diarrhea was nonbloody. 08/28/2018 seen by gastroenterology Dr. Vicente Males and the weight was 95 Ib TSH was checked which was normal.  Patient was given trials of Cryan and a fiber supplementation. 09/12/2018, seen by Dr. Vicente Males again in the office.  Weighed 95ib.  Patient reports that crying has caused her to have constipation.  She has benefited from crying and stool softeners. GI felt the patient may have constipation with overflow diarrhea. EGD 07/2016- endoscopically gastritis seen. small bowel bx - normal , gastric bx -moderate gastropathy  Colonoscopy 07/2016 - hyperplastic polyp, random colon bx negative for colitis. Medium sized internal hemorroids seen.  Patient also felt that her appetite has further decreased since July 2020.  Also feels some nausea CT has been ordered for further  evaluation of her unintentional weight loss and was not approved by insurance.  Patient was sent to heme-onc for further evaluation. Patient also reports some abdominal discomfort, around umbilical area,  associated with eating.  No alleviating or exacerbating factors.  Review of Systems  Constitutional: Positive for appetite change and unexpected weight change. Negative for chills, fatigue and fever.  HENT:   Negative for hearing loss and voice change.   Eyes: Negative for eye problems.  Respiratory: Negative for chest tightness and cough.   Cardiovascular: Negative for chest pain.  Gastrointestinal: Positive for abdominal pain and nausea. Negative for abdominal distention and blood in stool.  Endocrine: Negative for hot flashes.  Genitourinary: Negative for difficulty urinating and frequency.   Musculoskeletal: Negative for arthralgias.  Skin: Negative for itching and rash.  Neurological: Negative for extremity weakness.  Hematological: Negative for adenopathy.  Psychiatric/Behavioral: Negative for confusion.    MEDICAL HISTORY:  Past Medical History:  Diagnosis Date  . Adnexal mass   . Anxiety   . Barrett's esophagus   . Bell palsy    left foot also ,   . Bronchitis   . Complication of anesthesia    difficulty waking up  . COPD (chronic obstructive pulmonary disease) ([place])   . Degenerative disc disease, cervical   . Depression   . Environmental and seasonal allergies   . GERD (gastroesophageal reflux disease)   . Heart murmur    Hx  . High triglycerides   . Migraines   . Migraines   . Rapid heart rate     SURGICAL HISTORY: Past Surgical History:  Procedure  Laterality Date  . ABDOMINAL HYSTERECTOMY    . APPENDECTOMY    . CARDIAC CATHETERIZATION     [facility] pt doesn't have any stents or recall years ago 20+ yrs ago  . CHOLECYSTECTOMY    . COLONOSCOPY WITH PROPOFOL N/A 07/16/2016   Procedure: COLONOSCOPY WITH PROPOFOL;  Surgeon: [name], MD;  Location: [facility]  ENDOSCOPY;  Service: Endoscopy;  Laterality: N/A;  . ESOPHAGOGASTRODUODENOSCOPY (EGD) WITH PROPOFOL N/A 07/16/2016   Procedure: ESOPHAGOGASTRODUODENOSCOPY (EGD) WITH PROPOFOL;  Surgeon: [name], MD;  Location: [facility] ENDOSCOPY;  Service: Endoscopy;  Laterality: N/A;  . [place]  . LAPAROSCOPIC SALPINGO OOPHERECTOMY Bilateral 08/27/2016   Procedure: LAPAROSCOPIC SALPINGO OOPHORECTOMY;  Surgeon: Gae Dry, MD;  Location: [facility] ORS;  Service: Gynecology;  Laterality: Bilateral;  . TUBAL LIGATION  1983    SOCIAL HISTORY: Social History   Socioeconomic History  . Marital status: Married    Spouse name: Not on file  . Number of children: Not on file  . Years of education: Not on file  . Highest education level: Not on file  Occupational History  . Not on file  Social Needs  . Financial resource strain: Not hard at all  . Food insecurity    Worry: Never true    Inability: Never true  . Transportation needs    Medical: No    Non-medical: No  Tobacco Use  . Smoking status: Current Every Day Smoker    Packs/day: 1.00    Years: 30.00    Pack years: 30.00    Types: Cigarettes  . Smokeless tobacco: Never Used  Substance and Sexual Activity  . Alcohol use: Not Currently    Alcohol/week: 0.0 standard drinks  . Drug use: No  . Sexual activity: Not on file  Lifestyle  . Physical activity    Days per week: 0 days    Minutes per session: 0 min  . Stress: Not on file  Relationships  . Social Herbalist on phone: Patient refused    Gets together: Patient refused    Attends religious service: Patient refused    Active member of club or organization: Patient refused    Attends meetings of clubs or organizations: Patient refused    Relationship status: Patient refused  . Intimate partner violence    Fear of current or ex partner: Patient refused    Emotionally abused: Patient refused    Physically abused: Patient refused    Forced sexual activity:  Patient refused  Other Topics Concern  . Not on file  Social History Narrative  . Not on file    FAMILY HISTORY: Family History  Problem Relation Age of Onset  . Breast cancer Mother 62  . Skin cancer Mother   . Hypothyroidism Mother   . Parkinson's disease Mother   . Migraines Sister   . Colon cancer Father   . Lung cancer Father   . Heart disease Father   . Diabetes Paternal Grandmother   . Hypertension Paternal Grandmother   . Stroke Paternal Grandmother   . Dementia Paternal Grandfather   . Breast cancer Maternal Aunt        post men.  . Diabetes Paternal Uncle   . Breast cancer Cousin 34       maternal   . Multiple myeloma Cousin     ALLERGIES:  is allergic to diazepam; ciprofloxacin; codeine; metoprolol; morphine; niacin and related; topiramate; toradol [ketorolac tromethamine]; doxycycline; and iodinated diagnostic agents.  MEDICATIONS:  Current Outpatient Medications  Medication Sig Dispense Refill  . albuterol (PROVENTIL HFA;VENTOLIN HFA) 108 (90 Base) MCG/ACT inhaler Inhale 2 puffs into the lungs every 6 (six) hours as needed for wheezing or shortness of breath. 1 Inhaler 2  . ALPRAZolam (XANAX) 0.5 MG tablet Take 1 tablet (0.5 mg total) by mouth 3 (three) times daily as needed for anxiety. 90 tablet 0  . Ascorbic Acid (VITAMIN C) 1000 MG tablet Take 1,000 mg by mouth daily.    . baclofen (LIORESAL) 10 MG tablet TAKE 1 TABLET (10 MG TOTAL) BY MOUTH 2 (TWO) TIMES DAILY  2  . budesonide-formoterol (SYMBICORT) 160-4.5 MCG/ACT inhaler Inhale 1 puff into the lungs 2 (two) times daily. 1 Inhaler 0  . DULoxetine (CYMBALTA) 20 MG capsule Take by mouth.    Eduard Roux (AIMOVIG) 140 MG/ML SOAJ Inject 140 mg into the skin every 28 (twenty-eight) days.    Marland Kitchen gabapentin (NEURONTIN) 100 MG capsule Take 300 mg by mouth at bedtime.     Marland Kitchen HYDROcodone-acetaminophen (NORCO/VICODIN) 5-325 MG tablet Take 1 tablet by mouth every 6 (six) hours as needed. For pain 30 tablet 0  .  lipase/protease/amylase (CREON) 36000 UNITS CPEP capsule Take 2 caps with each meal and 1 with a snack 210 capsule 3  . loratadine (CLARITIN) 10 MG tablet Take 10 mg by mouth 2 (two) times daily.    . Multiple Vitamin (MULTIVITAMIN) tablet Take 1 tablet by mouth daily.    . pantoprazole (PROTONIX) 40 MG tablet TAKE 1 TABLET BY MOUTH ONCE DAILY 90 tablet 1  . Probiotic Product (PROBIOTIC-10) CHEW Chew 2 tablets by mouth daily.    . promethazine (PHENERGAN) 25 MG tablet Take 1 tablet (25 mg total) by mouth every 8 (eight) hours as needed for nausea or vomiting. 30 tablet 0  . QUEtiapine (SEROQUEL) 50 MG tablet Take 1 tablet (50 mg total) by mouth at bedtime. (Patient taking differently: Take 50 mg by mouth at bedtime. Takes 2 65m tablets) 90 tablet 3  . simvastatin (ZOCOR) 40 MG tablet TAKE 1 TABLET BY MOUTH AT BEDTIME 30 tablet 5  . Specialty Vitamins Products (BIOTIN PLUS KERATIN) 10000-100 MCG-MG TABS Take 1 tablet by mouth daily.     No current facility-administered medications for this visit.      PHYSICAL EXAMINATION: ECOG PERFORMANCE STATUS: 1 - Symptomatic but completely ambulatory Vitals:   10/03/18 0949  BP: 109/74  Pulse: 68  Resp: 20  Temp: 98.9 F (37.2 C)   Filed Weights   10/03/18 0949  Weight: 92 lb 1.6 oz (41.8 kg)    Physical Exam Constitutional:      General: She is not in acute distress.    Comments: thin  HENT:     Head: Normocephalic and atraumatic.  Eyes:     General: No scleral icterus.    Pupils: Pupils are equal, round, and reactive to light.  Neck:     Musculoskeletal: Normal range of motion and neck supple.  Cardiovascular:     Rate and Rhythm: Normal rate and regular rhythm.     Heart sounds: Normal heart sounds.  Pulmonary:     Effort: Pulmonary effort is normal. No respiratory distress.     Breath sounds: No wheezing.     Comments: Severely diminished breath sounds bilaterally. Abdominal:     General: Bowel sounds are normal. There is no  distension.     Palpations: Abdomen is soft. There is no mass.     Tenderness: There is no abdominal  tenderness.  Musculoskeletal: Normal range of motion.        General: No deformity.  Skin:    General: Skin is warm and dry.     Findings: No erythema or rash.  Neurological:     Mental Status: She is alert and oriented to person, place, and time.     Cranial Nerves: No cranial nerve deficit.     Coordination: Coordination normal.  Psychiatric:        Behavior: Behavior normal.        Thought Content: Thought content normal.     LABORATORY DATA:  I have reviewed the data as listed Lab Results  Component Value Date   WBC 5.7 10/03/2018   HGB 13.9 10/03/2018   HCT 40.9 10/03/2018   MCV 96.0 10/03/2018   PLT 246 10/03/2018   Recent Labs    06/08/18 1123 08/14/18 1604 08/23/18 1638  NA 144 142 141  K 3.9 3.8 3.7  CL 104 101 106  CO2 _0 GLUCOSE 77 86 97  BUN _1 CREATININE 0.88 0.94 0.82  CALCIUM 9.3 9.4 9.0  GFRNONAA 71 66 >60  GFRAA 82 76 >60  PROT 5.9* 6.4 6.5  ALBUMIN 4.2 4.5 4.1  AST _2 ALT _3 ALKPHOS 61 61 56  BILITOT <0.2 <0.2 0.4   Iron/TIBC/Ferritin/ %Sat No results found for: IRON, TIBC, FERRITIN, IRONPCTSAT    RADIOGRAPHIC STUDIES: I have personally reviewed the radiological images as listed and agreed with the findings in the report. Dg Abd 1 View  Result Date: 08/29/2018 CLINICAL DATA:  Diarrhea.  Constipation. EXAM: ABDOMEN - 1 VIEW COMPARISON:  CT 08/09/2016.  Abdomen 07/14/2016. FINDINGS: Surgical clips right upper quadrant. No bowel distention. Moderate amount of stool noted throughout the colon. Degenerative change lumbar spine scoliosis concave left. No acute bony abnormality. IMPRESSION: No acute abnormality identified. Moderate stool volume noted throughout the colon. Electronically Signed   By: Marcello Moores  Register   On: 08/29/2018 09:04      ASSESSMENT & PLAN:  1. Weight loss, unintentional   2. Abdominal pain,  unspecified abdominal location   3. Tobacco user   4. Centrilobular emphysema ([place])    #Significant unintentional weight loss, etiology unknown. Patient has had gastroenterology work-up including negative infectious etiology, EGD/colonoscopy in 2018 unremarkable not explaining weight loss. TSH is normal. Check multiple myeloma panel, light chain ratio, CBC, smear, LDH. In the context of unintentional weight loss, abdominal discomfort/pain, nausea, I would obtain CT abdomen pelvis without contrast for further evaluation.  #Tobacco user, centrilobular emphysema.  1 pack a day  since age of 68.  Patient has never had CT lung cancer screening.  Will refer to CT lung cancer screening program for a low-dose CT chest. Pulmonary cachexia will also be in the differential. Smoke cessation was discussed in details with patient.  She is not interested.  Orders Placed This Encounter  Procedures  . CT Abdomen Pelvis Wo Contrast    Standing Status:   Future    Standing Expiration Date:   10/03/2019    Order Specific Question:   ** REASON FOR EXAM (FREE TEXT)    Answer:   unintentional weight loss; abdominal pain    Order Specific Question:   Preferred imaging location?    Answer:   [facility]    Order Specific Question:   Is Oral Contrast requested for this exam?    Answer:   Yes, Per Radiology protocol  Order Specific Question:   Radiology Contrast Protocol - do NOT remove file path    Answer:   \\[email or web address].pdf  . CBC with Differential/Platelet    Standing Status:   Future    Number of Occurrences:   1    Standing Expiration Date:   10/03/2019  . Multiple Myeloma Panel (SPEP&IFE w/QIG)    Standing Status:   Future    Number of Occurrences:   1    Standing Expiration Date:   10/03/2019  . Kappa/lambda light chains    Standing Status:   Future    Number of Occurrences:   1    Standing Expiration Date:   10/03/2019  . Lactate dehydrogenase    Standing Status:    Future    Number of Occurrences:   1    Standing Expiration Date:   10/03/2019  . Technologist smear review    Standing Status:   Future    Number of Occurrences:   1    Standing Expiration Date:   10/03/2019  . Ambulatory Referral for Lung Cancer Screening    Referral Priority:   Routine    Referral Type:   Consultation    Referral Reason:   Specialty Services Required    Number of Visits Requested:   1    All questions were answered. The patient knows to call the clinic with any problems questions or concerns.  cc [name], MD    Return of visit: 3 months.  Thank you for this kind referral and the opportunity to participate in the care of this patient. A copy of today's note is routed to referring provider    Earlie Server, MD, PhD Hematology Oncology [facility] Pager- [phone] 10/04/2018

## 2018-10-04 NOTE — Telephone Encounter (Signed)
Received referral for initial lung cancer screening scan. Contacted patient and obtained smoking history,(current, 45 pack year) as well as answering questions related to screening process. Patient denies signs of lung cancer such as weight loss or hemoptysis. Patient denies comorbidity that would prevent curative treatment if lung cancer were found. Patient is scheduled for shared decision making visit and CT scan on 10/10/18 at 130pm.

## 2018-10-05 ENCOUNTER — Other Ambulatory Visit: Payer: Self-pay | Admitting: Family Medicine

## 2018-10-05 DIAGNOSIS — K2270 Barrett's esophagus without dysplasia: Secondary | ICD-10-CM

## 2018-10-06 ENCOUNTER — Other Ambulatory Visit: Payer: Self-pay | Admitting: Oncology

## 2018-10-10 ENCOUNTER — Inpatient Hospital Stay: Payer: BC Managed Care – PPO | Admitting: Nurse Practitioner

## 2018-10-10 ENCOUNTER — Ambulatory Visit: Payer: BC Managed Care – PPO

## 2018-10-10 ENCOUNTER — Telehealth: Payer: Self-pay | Admitting: *Deleted

## 2018-10-10 NOTE — Telephone Encounter (Signed)
Please schedule her to have virtual visit or in person visit with me to go over results after CT scan. Thanks.

## 2018-10-10 NOTE — Telephone Encounter (Signed)
Patient calling asking that she be called to go over her results.  Her next appointment is not until December   CBC with Differential/Platelet Order: [id number] Status:  Final result Visible to patient:  No (not released) Next appt:  10/13/2018 at 01:00 PM in Radiology ([facility]-CT) Dx:  Weight loss, unintentional; Abdominal...  Ref Range & Units 7d ago (10/03/18) 20moago (08/23/18) 175mogo (08/14/18)  WBC 4.0 - 10.5 K/uL 5.7  6.8  7.9 R   RBC 3.87 - 5.11 MIL/uL 4.26  4.10  4.17 R   Hemoglobin 12.0 - 15.0 g/dL 13.9  13.3  13.8 R   HCT 36.0 - 46.0 % 40.9  39.3  39.2 R   MCV 80.0 - 100.0 fL 96.0  95.9  94 R   MCH 26.0 - 34.0 pg 32.6  32.4  33.1High  R   MCHC 30.0 - 36.0 g/dL 34.0  33.8  35.2 R   RDW 11.5 - 15.5 % 13.0  12.7  12.9 R   Platelets 150 - 400 K/uL 246  223  229 R   nRBC 0.0 - 0.2 % 0.0  0.0 CM    Neutrophils Relative % % 53   57 R   Neutro Abs 1.7 - 7.7 K/uL 2.9   4.5 R   Lymphocytes Relative % 37     Lymphs Abs 0.7 - 4.0 K/uL 2.1   2.2 R   Monocytes Relative % 7     Monocytes Absolute 0.1 - 1.0 K/uL 0.4     Eosinophils Relative % 2     Eosinophils Absolute 0.0 - 0.5 K/uL 0.1     Basophils Relative % 1     Basophils Absolute 0.0 - 0.1 K/uL 0.1   0.1 R   Immature Granulocytes % 0   0 R   Abs Immature Granulocytes 0.00 - 0.07 K/uL 0.01     Comment: [facility]formed at [facility] [place]ymphs    28 R   Monocytes    8 R   Eos    6 R   Basos    1 R   Monocytes Absolute    0.7 R   EOS (ABSOLUTE)    0.5High  R   Immature Grans (Abs)    0.0 R   Resulting Agency  [facility]      Specimen Collected: 10/03/18 11:05 Last Resulted: 10/03/18 11:19     Lab Flowsheet   Order Details   View Encounter   Lab and Collection Details   Routing   Result History     CM=Additional commentsR=Reference range differs from displayed range      Other Results from 10/03/2018  Technologist smear review Order:  [id number]:  Final result Visible to patient:  No (not released) Next appt:  10/13/2018 at 01:00 PM in Radiology ([facility]-CT) Dx:  Weight loss, unintentional; Abdominal... Component 7d ago  WBC Morphology UNREMARKABLE   RBC Morphology UNREMARKABLE   Tech Review PLATELETS APPEAR ADEQUATE   Comment: Normal platelet morphology  Performed at [facility] [place] [address] Resulting Agency [facility]      Specimen Collected: 10/03/18 11:06 Last Resulted: 10/03/18 12:38     Lab Flowsheet   Order Details   View Encounter   Lab and Collection Details   Routing   Result History  Lactate dehydrogenase Order: [id number]  Status:  Final result Visible to patient:  No (not released) Next appt:  10/13/2018 at 01:00 PM in Radiology ([facility]-CT) Dx:  Weight loss, unintentional; Abdominal...  Ref Range & Units 7d ago  LDH 98 - 192 U/L 139   Comment: Performed at [facility], [address] [place], [place] [address]  Resulting Agency  [facility]      Specimen Collected: 10/03/18 11:05 Last Resulted: 10/03/18 11:28     Lab Flowsheet   Order Details   View Encounter   Lab and Collection Details   Routing   Result History           Kappa/lambda light chains Order: [id number]  Status:  Final result Visible to patient:  No (not released) Next appt:  10/13/2018 at 01:00 PM in Radiology ([facility]-CT) Dx:  Weight loss, unintentional; Abdominal...  Ref Range & Units 7d ago  Kappa free light chain 3.3 - 19.4 mg/L 18.2   Lamda free light chains 5.7 - 26.3 mg/L 12.0   Kappa, lamda light chain ratio 0.26 - 1.65 1.52   Comment: (NOTE)  Performed At: [facility]  [address] [place], [place] [id number]  Rush Farmer MD YQ:[id number]   Resulting Agency  [facility] LAB      Specimen Collected: 10/03/18 11:05 Last Resulted: 10/04/18 15:36     Lab Flowsheet   Order Details   View Encounter   Lab and  Collection Details   Routing   Result History           Contains abnormal data Multiple Myeloma Panel (SPEP&IFE w/QIG) Order: [id number]  Status:  Edited Result - FINAL Visible to patient:  No (not released) Next appt:  10/13/2018 at 01:00 PM in Radiology ([facility]-CT) Dx:  Weight loss, unintentional; Abdominal...  Ref Range & Units 7d ago 42moago 483mogo  IgG (Immunoglobin G), Serum 586 - 1,602 mg/dL 675     IgA 87 - 352 mg/dL 57Low      IgM (Immunoglobulin M), Srm 26 - 217 mg/dL 57     Total Protein ELP 6.0 - 8.5 g/dL 6.7 VC     Albumin SerPl Elph-Mcnc 2.9 - 4.4 g/dL 4.5High  VC     Alpha 1 0.0 - 0.4 g/dL 0.2 VC     Alpha2 Glob SerPl Elph-Mcnc 0.4 - 1.0 g/dL 0.7 VC     B-Globulin SerPl Elph-Mcnc 0.7 - 1.3 g/dL 0.7 VC     Gamma Glob SerPl Elph-Mcnc 0.4 - 1.8 g/dL 0.5 VC     M Protein SerPl Elph-Mcnc Not Observed g/dL Not Observed VC     Globulin, Total 2.2 - 3.9 g/dL 2.2 VC  1.9 R  1.7 R   Albumin/Glob SerPl 0.7 - 1.7 2.1High  VC     IFE 1  Comment VC     Comment: (NOTE)  The immunofixation pattern appears unremarkable. Evidence of  monoclonal protein is not apparent.   Please Note  Comment VC     Comment: (NOTE)  Protein electrophoresis scan will follow via computer, mail, or  courier delivery.  Performed At: [facility] Farmer[place] PhKJ:[id number] Resulting Agency  [facility]      Specimen Collected: 10/03/18 11:05 Last Resulted: 10/04/18 15:36

## 2018-10-11 NOTE — Telephone Encounter (Signed)
Virtual visit scheduled for 10/16/2018.

## 2018-10-13 ENCOUNTER — Other Ambulatory Visit: Payer: Self-pay

## 2018-10-13 ENCOUNTER — Ambulatory Visit
Admission: RE | Admit: 2018-10-13 | Discharge: 2018-10-13 | Disposition: A | Discharge status: Home / Self Care | Payer: BC Managed Care – PPO | Source: Ambulatory Visit | Attending: Oncology | Admitting: Oncology

## 2018-10-13 DIAGNOSIS — R634 Abnormal weight loss: Secondary | ICD-10-CM

## 2018-10-13 DIAGNOSIS — R109 Unspecified abdominal pain: Secondary | ICD-10-CM

## 2018-10-13 NOTE — Progress Notes (Signed)
Patient is coming in for follow up, she is doing ok. She would prefer to come in the clinic for her appointment. Says the virtual visit did not work for her previously.

## 2018-10-16 ENCOUNTER — Inpatient Hospital Stay: Payer: BC Managed Care – PPO

## 2018-10-16 ENCOUNTER — Inpatient Hospital Stay (HOSPITAL_BASED_OUTPATIENT_CLINIC_OR_DEPARTMENT_OTHER): Payer: BC Managed Care – PPO | Admitting: Oncology

## 2018-10-16 ENCOUNTER — Encounter: Payer: Self-pay | Admitting: Oncology

## 2018-10-16 ENCOUNTER — Other Ambulatory Visit: Payer: Self-pay

## 2018-10-16 VITALS — BP 110/73 | HR 71 | Temp 99.1°F | Resp 16 | Wt 91.5 lb

## 2018-10-16 DIAGNOSIS — R948 Abnormal results of function studies of other organs and systems: Secondary | ICD-10-CM | POA: Diagnosis not present

## 2018-10-16 DIAGNOSIS — Z87891 Personal history of nicotine dependence: Secondary | ICD-10-CM | POA: Diagnosis not present

## 2018-10-16 DIAGNOSIS — R933 Abnormal findings on diagnostic imaging of other parts of digestive tract: Secondary | ICD-10-CM

## 2018-10-16 DIAGNOSIS — R634 Abnormal weight loss: Secondary | ICD-10-CM | POA: Diagnosis not present

## 2018-10-16 DIAGNOSIS — R109 Unspecified abdominal pain: Secondary | ICD-10-CM | POA: Diagnosis not present

## 2018-10-16 NOTE — Progress Notes (Signed)
Hematology/Oncology Consult note [facility] [phone] [phone]   Patient Care Team: Rubye Beach as PCP - General (Family Medicine)  REFERRING PROVIDER: Mar Daring, P*  CHIEF COMPLAINTS/REASON FOR VISIT:  Evaluation of unintentional weight loss.  HISTORY OF PRESENTING ILLNESS:   Jordan Ortega is a  63 y.o.  female with PMH listed below was seen in consultation at the request of  Mar Daring, P*  for evaluation of unintentional weight loss Patient reports that she was in her usual state of health until January 2020 when she started to have unintentional weight loss. Her weight was 113 pound on 12/05/2017 when she was seen by cardiology Dr.: Christia Reading for arm pain  Weight was 104 pound on 03/13/2018 when she saw primary care provider With further decreased to 100 Ib on 05/30/2018 at PCPs office. 08/14/2018 97 Ib, at primary care physician's office.  She complained having diarrhea at that time.  She went to emergency room for evaluation of dehydration.  Their blood work showed C. difficile and a GI PCR were negative.  Normal CMP, lipase was normal.  Diarrhea was nonbloody. 08/28/2018 seen by gastroenterology Dr. Vicente Males and the weight was 95 Ib TSH was checked which was normal.  Patient was given trials of Cryan and a fiber supplementation. 09/12/2018, seen by Dr. Vicente Males again in the office.  Weighed 95ib.  Patient reports that crying has caused her to have constipation.  She has benefited from crying and stool softeners. GI felt the patient may have constipation with overflow diarrhea. EGD 07/2016- endoscopically gastritis seen. small bowel bx - normal , gastric bx -moderate gastropathy  Colonoscopy 07/2016 - hyperplastic polyp, random colon bx negative for colitis. Medium sized internal hemorroids seen.  Patient also felt that her appetite has further decreased since July 2020.  Also feels some nausea CT has been  ordered for further evaluation of her unintentional weight loss and was not approved by insurance.  Patient was sent to heme-onc for further evaluation. Patient also reports some abdominal discomfort, around umbilical area,  associated with eating.  No alleviating or exacerbating factors.  INTERVAL HISTORY Jordan Ortega is a 63 y.o. female who has above history reviewed by me today presents for follow up visit for management of unintentional weight loss. Problems and complaints are listed below: Patient has had CT scan done during toe and also lab work done. Today she reports feeling very anxious about work-up results. No new complaints. New to feel tired and fatigued.  Lost 1 pound since 2 weeks ago. Chronic abdominal discomfort and nausea.  Review of Systems  Constitutional: Positive for appetite change and unexpected weight change. Negative for chills, fatigue and fever.  HENT:   Negative for hearing loss and voice change.   Eyes: Negative for eye problems.  Respiratory: Negative for chest tightness and cough.   Cardiovascular: Negative for chest pain.  Gastrointestinal: Positive for abdominal pain and nausea. Negative for abdominal distention and blood in stool.  Endocrine: Negative for hot flashes.  Genitourinary: Negative for difficulty urinating and frequency.   Musculoskeletal: Negative for arthralgias.  Skin: Negative for itching and rash.  Neurological: Negative for extremity weakness.  Hematological: Negative for adenopathy.  Psychiatric/Behavioral: Negative for confusion.    MEDICAL HISTORY:  Past Medical History:  Diagnosis Date   Adnexal mass    Anxiety    Barrett's esophagus    Bell palsy    left foot also ,    Bronchitis  Complication of anesthesia    difficulty waking up   COPD (chronic obstructive pulmonary disease) (HCC)    Degenerative disc disease, cervical    Depression    Environmental and seasonal allergies    GERD (gastroesophageal  reflux disease)    Heart murmur    Hx   High triglycerides    Migraines    Migraines    Rapid heart rate     SURGICAL HISTORY: Past Surgical History:  Procedure Laterality Date   ABDOMINAL HYSTERECTOMY     APPENDECTOMY     CARDIAC CATHETERIZATION     [facility] pt doesn't have any stents or recall years ago 20+ yrs ago   CHOLECYSTECTOMY     COLONOSCOPY WITH PROPOFOL N/A 07/16/2016   Procedure: COLONOSCOPY WITH PROPOFOL;  Surgeon: [name], MD;  Location[facility] ENDOSCOPY;  Service: Endoscopy;  Laterality: N/A;   ESOPHAGOGASTRODUODENOSCOPY (EGD) WITH PROPOFOL N/A 07/16/2016   Procedure: ESOPHAGOGASTRODUODENOSCOPY (EGD) WITH PROPOFOL;  Surgeon: [name], MD;  Location: [facility] ENDOSCOPY;  Service: Endoscopy;  Laterality: N/A;   GALLBLADDER SURGERY  1980   LAPAROSCOPIC SALPINGO OOPHERECTOMY Bilateral 08/27/2016   Procedure: LAPAROSCOPIC SALPINGO OOPHORECTOMY;  Surgeon: Gae Dry, MD;  Location: [facility] ORS;  Service: Gynecology;  Laterality: Bilateral;   TUBAL LIGATION  1983    SOCIAL HISTORY: Social History   Socioeconomic History   Marital status: Married    Spouse name: Not on file   Number of children: Not on file   Years of education: Not on file   Highest education level: Not on file  Occupational History   Not on file  Social Needs   Financial resource strain: Not hard at all   Food insecurity    Worry: Never true    Inability: Never true   Transportation needs    Medical: No    Non-medical: No  Tobacco Use   Smoking status: Current Every Day Smoker    Packs/day: 1.00    Years: 30.00    Pack years: 30.00    Types: Cigarettes   Smokeless tobacco: Never Used  Substance and Sexual Activity   Alcohol use: Not Currently    Alcohol/week: 0.0 standard drinks   Drug use: No   Sexual activity: Not on file  Lifestyle   Physical activity    Days per week: 0 days    Minutes per session: 0 min   Stress: Not on file  Relationships    Social connections    Talks on phone: Patient refused    Gets together: Patient refused    Attends religious service: Patient refused    Active member of club or organization: Patient refused    Attends meetings of clubs or organizations: Patient refused    Relationship status: Patient refused   Intimate partner violence    Fear of current or ex partner: Patient refused    Emotionally abused: Patient refused    Physically abused: Patient refused    Forced sexual activity: Patient refused  Other Topics Concern   Not on file  Social History Narrative   Not on file    FAMILY HISTORY: Family History  Problem Relation Age of Onset   Breast cancer Mother 24   Skin cancer Mother    Hypothyroidism Mother    Parkinson's disease Mother    Migraines Sister    Colon cancer Father    Lung cancer Father    Heart disease Father    Diabetes Paternal Grandmother    Hypertension Paternal Grandmother  Stroke Paternal Grandmother    Dementia Paternal Grandfather    Breast cancer Maternal Aunt        post men.   Diabetes Paternal Uncle    Breast cancer Cousin 68       maternal    Multiple myeloma Cousin     ALLERGIES:  is allergic to diazepam; ciprofloxacin; codeine; metoprolol; morphine; niacin and related; topiramate; toradol [ketorolac tromethamine]; doxycycline; and iodinated diagnostic agents.  MEDICATIONS:  Current Outpatient Medications  Medication Sig Dispense Refill   albuterol (PROVENTIL HFA;VENTOLIN HFA) 108 (90 Base) MCG/ACT inhaler Inhale 2 puffs into the lungs every 6 (six) hours as needed for wheezing or shortness of breath. 1 Inhaler 2   ALPRAZolam (XANAX) 0.5 MG tablet Take 1 tablet (0.5 mg total) by mouth 3 (three) times daily as needed for anxiety. 90 tablet 0   Ascorbic Acid (VITAMIN C) 1000 MG tablet Take 1,000 mg by mouth daily.     baclofen (LIORESAL) 10 MG tablet TAKE 1 TABLET (10 MG TOTAL) BY MOUTH 2 (TWO) TIMES DAILY  2    budesonide-formoterol (SYMBICORT) 160-4.5 MCG/ACT inhaler Inhale 1 puff into the lungs 2 (two) times daily. 1 Inhaler 0   DULoxetine (CYMBALTA) 20 MG capsule Take by mouth.     Erenumab-aooe (AIMOVIG) 140 MG/ML SOAJ Inject 140 mg into the skin every 28 (twenty-eight) days.     gabapentin (NEURONTIN) 100 MG capsule Take 300 mg by mouth at bedtime.      HYDROcodone-acetaminophen (NORCO/VICODIN) 5-325 MG tablet Take 1 tablet by mouth every 6 (six) hours as needed. For pain 30 tablet 0   lipase/protease/amylase (CREON) 36000 UNITS CPEP capsule Take 2 caps with each meal and 1 with a snack 210 capsule 3   loratadine (CLARITIN) 10 MG tablet Take 10 mg by mouth 2 (two) times daily.     Multiple Vitamin (MULTIVITAMIN) tablet Take 1 tablet by mouth daily.     pantoprazole (PROTONIX) 40 MG tablet TAKE 1 TABLET BY MOUTH ONCE DAILY 90 tablet 1   Probiotic Product (PROBIOTIC-10) CHEW Chew 2 tablets by mouth daily.     promethazine (PHENERGAN) 25 MG tablet Take 1 tablet (25 mg total) by mouth every 8 (eight) hours as needed for nausea or vomiting. 30 tablet 0   QUEtiapine (SEROQUEL) 50 MG tablet Take 1 tablet (50 mg total) by mouth at bedtime. (Patient taking differently: Take 50 mg by mouth at bedtime. Takes 2 53m tablets) 90 tablet 3   simvastatin (ZOCOR) 40 MG tablet TAKE 1 TABLET BY MOUTH AT BEDTIME 30 tablet 5   Specialty Vitamins Products (BIOTIN PLUS KERATIN) 10000-100 MCG-MG TABS Take 1 tablet by mouth daily.     No current facility-administered medications for this visit.      PHYSICAL EXAMINATION: ECOG PERFORMANCE STATUS: 1 - Symptomatic but completely ambulatory Vitals:   10/16/18 0848  BP: 110/73  Pulse: 71  Resp: 16  Temp: 99.1 F (37.3 C)   Filed Weights   10/16/18 0848  Weight: 91 lb 8 oz (41.5 kg)    Physical Exam Constitutional:      General: She is not in acute distress.    Comments: thin  HENT:     Head: Normocephalic and atraumatic.  Eyes:     General: No  scleral icterus.    Pupils: Pupils are equal, round, and reactive to light.  Neck:     Musculoskeletal: Normal range of motion and neck supple.  Cardiovascular:     Rate and Rhythm: Normal rate  and regular rhythm.     Heart sounds: Normal heart sounds.  Pulmonary:     Effort: Pulmonary effort is normal. No respiratory distress.     Breath sounds: No wheezing.     Comments: Severely diminished breath sounds bilaterally. Abdominal:     General: Bowel sounds are normal. There is no distension.     Palpations: Abdomen is soft. There is no mass.     Tenderness: There is no abdominal tenderness.  Musculoskeletal: Normal range of motion.        General: No deformity.  Skin:    General: Skin is warm and dry.     Findings: No erythema or rash.  Neurological:     Mental Status: She is alert and oriented to person, place, and time.     Cranial Nerves: No cranial nerve deficit.     Coordination: Coordination normal.  Psychiatric:        Behavior: Behavior normal.        Thought Content: Thought content normal.     LABORATORY DATA:  I have reviewed the data as listed Lab Results  Component Value Date   WBC 5.7 10/03/2018   HGB 13.9 10/03/2018   HCT 40.9 10/03/2018   MCV 96.0 10/03/2018   PLT 246 10/03/2018   Recent Labs    06/08/18 1123 08/14/18 1604 08/23/18 1638  NA 144 142 141  K 3.9 3.8 3.7  CL 104 101 106  CO2 '27 24 26  ' GLUCOSE 77 86 97  BUN '18 15 14  ' CREATININE 0.88 0.94 0.82  CALCIUM 9.3 9.4 9.0  GFRNONAA 71 66 >60  GFRAA 82 76 >60  PROT 5.9* 6.4 6.5  ALBUMIN 4.2 4.5 4.1  AST '13 16 18  ' ALT '13 9 13  ' ALKPHOS 61 61 56  BILITOT <0.2 <0.2 0.4   Iron/TIBC/Ferritin/ %Sat No results found for: IRON, TIBC, FERRITIN, IRONPCTSAT    RADIOGRAPHIC STUDIES: I have personally reviewed the radiological images as listed and agreed with the findings in the report. Ct Abdomen Pelvis Wo Contrast  Result Date: 10/13/2018 CLINICAL DATA:  Abdominal pain and loss of weight.  EXAM: CT CHEST, ABDOMEN AND PELVIS WITHOUT CONTRAST TECHNIQUE: Multidetector CT imaging of the chest, abdomen and pelvis was performed following the standard protocol without IV contrast. COMPARISON:  CT AP 08/09/2016. FINDINGS: CT CHEST FINDINGS Cardiovascular: No significant vascular findings. Normal heart size. No pericardial effusion. Mediastinum/Nodes: No enlarged mediastinal, hilar, or axillary lymph nodes. Thyroid gland, trachea, and esophagus demonstrate no significant findings. Lungs/Pleura: Small peripheral nodule in the lateral left upper lobe is nonspecific measuring 4 mm, image 69/3. Disc shaped nodule along the oblique fissure of the left lung is noted and likely represents an intrapulmonary lymph node. 2 mm lateral left lower lobe lung nodule is identified, image 110/3. No additional suspicious nodules or masses identified. Musculoskeletal: No chest wall mass or suspicious bone lesions identified. CT ABDOMEN PELVIS FINDINGS Hepatobiliary: No focal liver abnormality is seen. Status post cholecystectomy. No biliary dilatation. Pancreas: Unremarkable. No pancreatic ductal dilatation or surrounding inflammatory changes. Spleen: Normal in size without focal abnormality. Adrenals/Urinary Tract: Normal adrenal glands. No kidney stone or mass identified. No hydronephrosis. Urinary bladder is unremarkable. Stomach/Bowel: Stomach is within normal limits. No small bowel wall thickening, inflammation or distension. 4.4 cm segment of mid transverse colon with circumferential wall thickening is identified measuring 2.3 cm in diameter, image 55/4. No surrounding inflammatory changes identified. Mild pre stenotic dilatation identified. No obstruction. Vascular/Lymphatic: Aortic atherosclerosis. No aneurysm. No  abdominal no pelvic adenopathy identified. Reproductive: Status post hysterectomy. No adnexal masses. Other: No free fluid or fluid collections. Musculoskeletal: No acute or significant osseous findings.  IMPRESSION: 1. There is a 4.4 cm segment of mid transverse colon which exhibits circumferential wall thickening and has a maximum diameter of 2.3 cm. Pre stenotic dilatation is identified within the more proximal transverse colon. Primary differential considerations include colonic neoplasm versus postinflammatory stricture. Correlation with colonoscopy advised. 2. Several small nonspecific pulmonary nodules are identified measuring less than 5 mm. No follow-up needed if patient is low-risk (and has no known or suspected primary neoplasm). Non-contrast chest CT can be considered in 12 months if patient is high-risk. This recommendation follows the consensus statement: Guidelines for Management of Incidental Pulmonary Nodules Detected on CT Images: From the Fleischner Society 2017; Radiology 2017; 284:228-243. 3.  Aortic Atherosclerosis (ICD10-I70.0). Electronically Signed   By: Kerby Moors M.D.   On: 10/13/2018 14:23   Dg Abd 1 View  Result Date: 08/29/2018 CLINICAL DATA:  Diarrhea.  Constipation. EXAM: ABDOMEN - 1 VIEW COMPARISON:  CT 08/09/2016.  Abdomen 07/14/2016. FINDINGS: Surgical clips right upper quadrant. No bowel distention. Moderate amount of stool noted throughout the colon. Degenerative change lumbar spine scoliosis concave left. No acute bony abnormality. IMPRESSION: No acute abnormality identified. Moderate stool volume noted throughout the colon. Electronically Signed   By: Marcello Moores  Register   On: 08/29/2018 09:04   Ct Chest Wo Contrast  Result Date: 10/13/2018 CLINICAL DATA:  Abdominal pain and loss of weight. EXAM: CT CHEST, ABDOMEN AND PELVIS WITHOUT CONTRAST TECHNIQUE: Multidetector CT imaging of the chest, abdomen and pelvis was performed following the standard protocol without IV contrast. COMPARISON:  CT AP 08/09/2016. FINDINGS: CT CHEST FINDINGS Cardiovascular: No significant vascular findings. Normal heart size. No pericardial effusion. Mediastinum/Nodes: No enlarged mediastinal,  hilar, or axillary lymph nodes. Thyroid gland, trachea, and esophagus demonstrate no significant findings. Lungs/Pleura: Small peripheral nodule in the lateral left upper lobe is nonspecific measuring 4 mm, image 69/3. Disc shaped nodule along the oblique fissure of the left lung is noted and likely represents an intrapulmonary lymph node. 2 mm lateral left lower lobe lung nodule is identified, image 110/3. No additional suspicious nodules or masses identified. Musculoskeletal: No chest wall mass or suspicious bone lesions identified. CT ABDOMEN PELVIS FINDINGS Hepatobiliary: No focal liver abnormality is seen. Status post cholecystectomy. No biliary dilatation. Pancreas: Unremarkable. No pancreatic ductal dilatation or surrounding inflammatory changes. Spleen: Normal in size without focal abnormality. Adrenals/Urinary Tract: Normal adrenal glands. No kidney stone or mass identified. No hydronephrosis. Urinary bladder is unremarkable. Stomach/Bowel: Stomach is within normal limits. No small bowel wall thickening, inflammation or distension. 4.4 cm segment of mid transverse colon with circumferential wall thickening is identified measuring 2.3 cm in diameter, image 55/4. No surrounding inflammatory changes identified. Mild pre stenotic dilatation identified. No obstruction. Vascular/Lymphatic: Aortic atherosclerosis. No aneurysm. No abdominal no pelvic adenopathy identified. Reproductive: Status post hysterectomy. No adnexal masses. Other: No free fluid or fluid collections. Musculoskeletal: No acute or significant osseous findings. IMPRESSION: 1. There is a 4.4 cm segment of mid transverse colon which exhibits circumferential wall thickening and has a maximum diameter of 2.3 cm. Pre stenotic dilatation is identified within the more proximal transverse colon. Primary differential considerations include colonic neoplasm versus postinflammatory stricture. Correlation with colonoscopy advised. 2. Several small  nonspecific pulmonary nodules are identified measuring less than 5 mm. No follow-up needed if patient is low-risk (and has no known or suspected primary neoplasm).  Non-contrast chest CT can be considered in 12 months if patient is high-risk. This recommendation follows the consensus statement: Guidelines for Management of Incidental Pulmonary Nodules Detected on CT Images: From the Fleischner Society 2017; Radiology 2017; 284:228-243. 3.  Aortic Atherosclerosis (ICD10-I70.0). Electronically Signed   By: Kerby Moors M.D.   On: 10/13/2018 14:23      ASSESSMENT & PLAN:  1. Weight loss, unintentional   2. Personal history of tobacco use, presenting hazards to health   3. Abdominal pain, unspecified abdominal location   4. Abnormal CT scan, colon    #Significant unintentional weight loss, CBC is nonremarkable.  Smear is not remarkable. Multiple myeloma panel showed negative for monoclonal protein..  Kappa light chain ratio is normal. Normal LDH. CT chest abdomen pelvis images were independently reviewed by me and discussed with patient There is a 4.4 cm segment of mid transverse colon which exhibits circumferential wall thickening and has a maximum diameter of 2.3 cm.  Prestenotic dilation is identified within the more proximal transverse colon.  Primary differential considerations include colon cancer versus postinflammatory stricture. Communicated with gastroenterology Dr. Vicente Males who will see patient this week for further evaluation of repeat colonoscopy.  #Several small nonspecific pulmonary nodules are identified measuring less than 5 mm. Tobacco user, centrilobular emphysema.  1 pack a day  since age of 26.  Patient is at risk of developing lung cancer.  We will continue to follow-up. Smoke cessation was discussed in details with patient.  She is not interested.  Orders Placed This Encounter  Procedures   CEA    Standing Status:   Future    Number of Occurrences:   1    Standing  Expiration Date:   10/16/2019    All questions were answered. The patient knows to call the clinic with any problems questions or concerns.  cc Mar Daring, P*    Return of visit: Follow-up to be determined depending on colonoscopy results.  Patient knows to call clinic to schedule follow-up appointments.    Earlie Server, MD, PhD Hematology Oncology [facility] Pager- [phone] 10/16/2018

## 2018-10-17 ENCOUNTER — Other Ambulatory Visit: Payer: Self-pay | Admitting: Physician Assistant

## 2018-10-17 ENCOUNTER — Ambulatory Visit: Payer: BC Managed Care – PPO | Admitting: Gastroenterology

## 2018-10-17 ENCOUNTER — Encounter
Admission: RE | Admit: 2018-10-17 | Discharge: 2018-10-17 | Disposition: A | Discharge status: Home / Self Care | Payer: BC Managed Care – PPO | Source: Ambulatory Visit | Attending: Gastroenterology | Admitting: Gastroenterology

## 2018-10-17 ENCOUNTER — Other Ambulatory Visit: Payer: Self-pay

## 2018-10-17 VITALS — BP 118/76 | HR 67 | Temp 98.1°F | Ht 60.0 in | Wt 91.8 lb

## 2018-10-17 DIAGNOSIS — G43411 Hemiplegic migraine, intractable, with status migrainosus: Secondary | ICD-10-CM

## 2018-10-17 DIAGNOSIS — R933 Abnormal findings on diagnostic imaging of other parts of digestive tract: Secondary | ICD-10-CM

## 2018-10-17 DIAGNOSIS — R634 Abnormal weight loss: Secondary | ICD-10-CM | POA: Diagnosis not present

## 2018-10-17 DIAGNOSIS — Z20828 Contact with and (suspected) exposure to other viral communicable diseases: Secondary | ICD-10-CM | POA: Diagnosis not present

## 2018-10-17 DIAGNOSIS — G43009 Migraine without aura, not intractable, without status migrainosus: Secondary | ICD-10-CM

## 2018-10-17 DIAGNOSIS — Z01812 Encounter for preprocedural laboratory examination: Secondary | ICD-10-CM | POA: Diagnosis not present

## 2018-10-17 LAB — CEA: CEA: 5.7 ng/mL — ABNORMAL HIGH (ref 0.0–4.7)

## 2018-10-17 MED ORDER — HYDROCODONE-ACETAMINOPHEN 5-325 MG PO TABS: 1.0000 | ORAL_TABLET | Freq: Four times a day (QID) | ORAL | 0 refills | Status: DC | PRN

## 2018-10-17 MED ORDER — PROMETHAZINE HCL 25 MG PO TABS: 25.0000 mg | ORAL_TABLET | Freq: Three times a day (TID) | ORAL | 0 refills | Status: DC | PRN

## 2018-10-17 MED ORDER — PROMETHAZINE HCL 25 MG PO TABS: 25.0000 mg | ORAL_TABLET | Freq: Three times a day (TID) | ORAL | 0 refills | Status: AC | PRN

## 2018-10-17 NOTE — Telephone Encounter (Signed)
Pt called asking if she could get a refill on   Phenergan   Hydrocodone 5-325  She has been having migraines lately  [place]

## 2018-10-17 NOTE — Progress Notes (Signed)
[name] MD, MRCP(U.K) [address]  [place], [place] [address]  Main: [phone]  Fax: [phone]   Primary Care Physician: Mar Daring, PA-C  Primary Gastroenterologist:  Dr. [name]   Abnormal CT scan of the abdomen  HPI: Jordan Ortega is a 63 y.o. female who I have been following as an outpatient for irritable bowel syndrome.  I have treated on her also for constipation and overflow diarrhea.  She did mention to me concerns of unintentional weight loss and I did try obtain a CT scan of the abdomen but unfortunately was not authorized by her insurance provider.  I referred the patient to Dr. Tasia Catchings in oncology and she underwent a CT scan of the abdomen on 10/13/2018 which demonstrated a 4.4 cm segment of mid transverse colon which exhibits circumferential wall thickening and has a maximum diameter of 2.3 cm.  Some mention of prestenotic dilation is identified within the proximal transverse colon.  Endoscopy was recommended to correlate for colonic neoplasm versus postinflammatory stricture.  I have previously performed an upper endoscopy and colonoscopy in June 2018 and found no abnormality in the transverse colon at that point of time.  She is here today to see me for to discuss the results.  Since her last visit she says she continues to have poor appetite, weight loss, nausea.  Current Outpatient Medications  Medication Sig Dispense Refill  . albuterol (PROVENTIL HFA;VENTOLIN HFA) 108 (90 Base) MCG/ACT inhaler Inhale 2 puffs into the lungs every 6 (six) hours as needed for wheezing or shortness of breath. 1 Inhaler 2  . ALPRAZolam (XANAX) 0.5 MG tablet Take 1 tablet (0.5 mg total) by mouth 3 (three) times daily as needed for anxiety. 90 tablet 0  . Ascorbic Acid (VITAMIN C) 1000 MG tablet Take 1,000 mg by mouth daily.    . baclofen (LIORESAL) 10 MG tablet TAKE 1 TABLET (10 MG TOTAL) BY MOUTH 2 (TWO) TIMES DAILY  2  . budesonide-formoterol (SYMBICORT)  160-4.5 MCG/ACT inhaler Inhale 1 puff into the lungs 2 (two) times daily. 1 Inhaler 0  . DULoxetine (CYMBALTA) 20 MG capsule Take by mouth.    Eduard Roux (AIMOVIG) 140 MG/ML SOAJ Inject 140 mg into the skin every 28 (twenty-eight) days.    Marland Kitchen gabapentin (NEURONTIN) 100 MG capsule Take 300 mg by mouth at bedtime.     Marland Kitchen HYDROcodone-acetaminophen (NORCO/VICODIN) 5-325 MG tablet Take 1 tablet by mouth every 6 (six) hours as needed. For pain 30 tablet 0  . lipase/protease/amylase (CREON) 36000 UNITS CPEP capsule Take 2 caps with each meal and 1 with a snack 210 capsule 3  . loratadine (CLARITIN) 10 MG tablet Take 10 mg by mouth 2 (two) times daily.    . Multiple Vitamin (MULTIVITAMIN) tablet Take 1 tablet by mouth daily.    . pantoprazole (PROTONIX) 40 MG tablet TAKE 1 TABLET BY MOUTH ONCE DAILY 90 tablet 1  . Probiotic Product (PROBIOTIC-10) CHEW Chew 2 tablets by mouth daily.    . promethazine (PHENERGAN) 25 MG tablet Take 1 tablet (25 mg total) by mouth every 8 (eight) hours as needed for nausea or vomiting. 30 tablet 0  . QUEtiapine (SEROQUEL) 50 MG tablet Take 1 tablet (50 mg total) by mouth at bedtime. (Patient taking differently: Take 50 mg by mouth at bedtime. Takes 2 25mg  tablets) 90 tablet 3  . simvastatin (ZOCOR) 40 MG tablet TAKE 1 TABLET BY MOUTH AT BEDTIME 30 tablet 5  . Specialty Vitamins  Products (BIOTIN PLUS KERATIN) 10000-100 MCG-MG TABS Take 1 tablet by mouth daily.     No current facility-administered medications for this visit.     Allergies as of 10/17/2018 - Review Complete 10/16/2018  Allergen Reaction Noted  . Diazepam Other (See Comments) 11/20/2009  . Ciprofloxacin Other (See Comments) 06/21/2013  . Codeine Hives and Itching 11/20/2009  . Metoprolol  07/10/2014  . Morphine Hives 11/20/2009  . Niacin and related Other (See Comments) 11/28/2011  . Topiramate Other (See Comments) 11/01/2017  . Toradol [ketorolac tromethamine] Itching 07/10/2014  . Doxycycline  Diarrhea 03/21/2018  . Iodinated diagnostic agents Hives 07/11/2014    ROS:  General: Negative for anorexia, weight loss, fever, chills, fatigue, weakness. ENT: Negative for hoarseness, difficulty swallowing , nasal congestion. CV: Negative for chest pain, angina, palpitations, dyspnea on exertion, peripheral edema.  Respiratory: Negative for dyspnea at rest, dyspnea on exertion, cough, sputum, wheezing.  GI: See history of present illness. GU:  Negative for dysuria, hematuria, ur  inary incontinence, urinary frequency, nocturnal urination.  Endo: Negative for unusual weight change.    Physical Examination:   There were no vitals taken for this visit.  General: Well-nourished, well-developed in no acute distress.  Eyes: No icterus. Conjunctivae pink. Mouth: Oropharyngeal mucosa moist and pink , no lesions erythema or exudate. Lungs: Clear to auscultation bilaterally. Non-labored. Heart: Regular rate and rhythm, no murmurs rubs or gallops.  Abdomen: Bowel sounds are normal, nontender, nondistended, no hepatosplenomegaly or masses, no abdominal bruits or hernia , no rebound or guarding.   Extremities: No lower extremity edema. No clubbing or deformities. Neuro: Alert and oriented x 3.  Grossly intact. Skin: Warm and dry, no jaundice.   Psych: Alert and cooperative, normal mood and affect.   Imaging Studies: Ct Abdomen Pelvis Wo Contrast  Result Date: 10/13/2018 CLINICAL DATA:  Abdominal pain and loss of weight. EXAM: CT CHEST, ABDOMEN AND PELVIS WITHOUT CONTRAST TECHNIQUE: Multidetector CT imaging of the chest, abdomen and pelvis was performed following the standard protocol without IV contrast. COMPARISON:  CT AP 08/09/2016. FINDINGS: CT CHEST FINDINGS Cardiovascular: No significant vascular findings. Normal heart size. No pericardial effusion. Mediastinum/Nodes: No enlarged mediastinal, hilar, or axillary lymph nodes. Thyroid gland, trachea, and esophagus demonstrate no significant  findings. Lungs/Pleura: Small peripheral nodule in the lateral left upper lobe is nonspecific measuring 4 mm, image 69/3. Disc shaped nodule along the oblique fissure of the left lung is noted and likely represents an intrapulmonary lymph node. 2 mm lateral left lower lobe lung nodule is identified, image 110/3. No additional suspicious nodules or masses identified. Musculoskeletal: No chest wall mass or suspicious bone lesions identified. CT ABDOMEN PELVIS FINDINGS Hepatobiliary: No focal liver abnormality is seen. Status post cholecystectomy. No biliary dilatation. Pancreas: Unremarkable. No pancreatic ductal dilatation or surrounding inflammatory changes. Spleen: Normal in size without focal abnormality. Adrenals/Urinary Tract: Normal adrenal glands. No kidney stone or mass identified. No hydronephrosis. Urinary bladder is unremarkable. Stomach/Bowel: Stomach is within normal limits. No small bowel wall thickening, inflammation or distension. 4.4 cm segment of mid transverse colon with circumferential wall thickening is identified measuring 2.3 cm in diameter, image 55/4. No surrounding inflammatory changes identified. Mild pre stenotic dilatation identified. No obstruction. Vascular/Lymphatic: Aortic atherosclerosis. No aneurysm. No abdominal no pelvic adenopathy identified. Reproductive: Status post hysterectomy. No adnexal masses. Other: No free fluid or fluid collections. Musculoskeletal: No acute or significant osseous findings. IMPRESSION: 1. There is a 4.4 cm segment of mid transverse colon which exhibits circumferential wall thickening and  has a maximum diameter of 2.3 cm. Pre stenotic dilatation is identified within the more proximal transverse colon. Primary differential considerations include colonic neoplasm versus postinflammatory stricture. Correlation with colonoscopy advised. 2. Several small nonspecific pulmonary nodules are identified measuring less than 5 mm. No follow-up needed if patient is  low-risk (and has no known or suspected primary neoplasm). Non-contrast chest CT can be considered in 12 months if patient is high-risk. This recommendation follows the consensus statement: Guidelines for Management of Incidental Pulmonary Nodules Detected on CT Images: From the Fleischner Society 2017; Radiology 2017; 284:228-243. 3.  Aortic Atherosclerosis (ICD10-I70.0). Electronically Signed   By: Kerby Moors M.D.   On: 10/13/2018 14:23   Ct Chest Wo Contrast  Result Date: 10/13/2018 CLINICAL DATA:  Abdominal pain and loss of weight. EXAM: CT CHEST, ABDOMEN AND PELVIS WITHOUT CONTRAST TECHNIQUE: Multidetector CT imaging of the chest, abdomen and pelvis was performed following the standard protocol without IV contrast. COMPARISON:  CT AP 08/09/2016. FINDINGS: CT CHEST FINDINGS Cardiovascular: No significant vascular findings. Normal heart size. No pericardial effusion. Mediastinum/Nodes: No enlarged mediastinal, hilar, or axillary lymph nodes. Thyroid gland, trachea, and esophagus demonstrate no significant findings. Lungs/Pleura: Small peripheral nodule in the lateral left upper lobe is nonspecific measuring 4 mm, image 69/3. Disc shaped nodule along the oblique fissure of the left lung is noted and likely represents an intrapulmonary lymph node. 2 mm lateral left lower lobe lung nodule is identified, image 110/3. No additional suspicious nodules or masses identified. Musculoskeletal: No chest wall mass or suspicious bone lesions identified. CT ABDOMEN PELVIS FINDINGS Hepatobiliary: No focal liver abnormality is seen. Status post cholecystectomy. No biliary dilatation. Pancreas: Unremarkable. No pancreatic ductal dilatation or surrounding inflammatory changes. Spleen: Normal in size without focal abnormality. Adrenals/Urinary Tract: Normal adrenal glands. No kidney stone or mass identified. No hydronephrosis. Urinary bladder is unremarkable. Stomach/Bowel: Stomach is within normal limits. No small bowel  wall thickening, inflammation or distension. 4.4 cm segment of mid transverse colon with circumferential wall thickening is identified measuring 2.3 cm in diameter, image 55/4. No surrounding inflammatory changes identified. Mild pre stenotic dilatation identified. No obstruction. Vascular/Lymphatic: Aortic atherosclerosis. No aneurysm. No abdominal no pelvic adenopathy identified. Reproductive: Status post hysterectomy. No adnexal masses. Other: No free fluid or fluid collections. Musculoskeletal: No acute or significant osseous findings. IMPRESSION: 1. There is a 4.4 cm segment of mid transverse colon which exhibits circumferential wall thickening and has a maximum diameter of 2.3 cm. Pre stenotic dilatation is identified within the more proximal transverse colon. Primary differential considerations include colonic neoplasm versus postinflammatory stricture. Correlation with colonoscopy advised. 2. Several small nonspecific pulmonary nodules are identified measuring less than 5 mm. No follow-up needed if patient is low-risk (and has no known or suspected primary neoplasm). Non-contrast chest CT can be considered in 12 months if patient is high-risk. This recommendation follows the consensus statement: Guidelines for Management of Incidental Pulmonary Nodules Detected on CT Images: From the Fleischner Society 2017; Radiology 2017; 284:228-243. 3.  Aortic Atherosclerosis (ICD10-I70.0). Electronically Signed   By: Kerby Moors M.D.   On: 10/13/2018 14:23    Assessment and Plan:   Dyanni Battistini is a 63 y.o. y/o female follow-up with me for irritable bowel syndrome with constipation and possibly overflow diarrhea.  She also see me for unintentional weight loss but her insurance carrier denied request for the CT scan of the abdomen and subsequently was approved after being referred to oncology.  CT scan of the abdomen showed an  abnormality in the transverse colon.  Some thickening has been noted.  I had  performed a colonoscopy in June 2018 which was reported normal.  I would suggest that we proceed with urgent colonoscopy to evaluate the abnormality seen on the CT scan of the abdomen.  I would suggest we perform it either this Friday     Dr [name]  MD,MRCP [facility]) Follow up in 1 week

## 2018-10-18 LAB — SARS CORONAVIRUS 2 (TAT 6-24 HRS): SARS Coronavirus 2: NEGATIVE

## 2018-10-20 ENCOUNTER — Ambulatory Visit
Admission: RE | Admit: 2018-10-20 | Discharge: 2018-10-20 | Disposition: A | Discharge status: Home / Self Care | Payer: BC Managed Care – PPO | Attending: Gastroenterology | Admitting: Gastroenterology

## 2018-10-20 ENCOUNTER — Ambulatory Visit: Payer: BC Managed Care – PPO | Admitting: Anesthesiology

## 2018-10-20 ENCOUNTER — Encounter: Payer: Self-pay | Admitting: Anesthesiology

## 2018-10-20 ENCOUNTER — Encounter
Admission: RE | Disposition: A | Discharge status: Home / Self Care | Payer: Self-pay | Source: Home / Self Care | Attending: Gastroenterology

## 2018-10-20 ENCOUNTER — Other Ambulatory Visit: Payer: Self-pay

## 2018-10-20 DIAGNOSIS — F419 Anxiety disorder, unspecified: Secondary | ICD-10-CM | POA: Diagnosis not present

## 2018-10-20 DIAGNOSIS — K2270 Barrett's esophagus without dysplasia: Secondary | ICD-10-CM | POA: Insufficient documentation

## 2018-10-20 DIAGNOSIS — Z881 Allergy status to other antibiotic agents status: Secondary | ICD-10-CM | POA: Diagnosis not present

## 2018-10-20 DIAGNOSIS — Z823 Family history of stroke: Secondary | ICD-10-CM | POA: Insufficient documentation

## 2018-10-20 DIAGNOSIS — Z800 Family history of malignant neoplasm of digestive organs: Secondary | ICD-10-CM | POA: Diagnosis not present

## 2018-10-20 DIAGNOSIS — G510 Bell's palsy: Secondary | ICD-10-CM | POA: Insufficient documentation

## 2018-10-20 DIAGNOSIS — Z8249 Family history of ischemic heart disease and other diseases of the circulatory system: Secondary | ICD-10-CM | POA: Insufficient documentation

## 2018-10-20 DIAGNOSIS — J449 Chronic obstructive pulmonary disease, unspecified: Secondary | ICD-10-CM | POA: Diagnosis not present

## 2018-10-20 DIAGNOSIS — M5030 Other cervical disc degeneration, unspecified cervical region: Secondary | ICD-10-CM | POA: Insufficient documentation

## 2018-10-20 DIAGNOSIS — Z803 Family history of malignant neoplasm of breast: Secondary | ICD-10-CM | POA: Diagnosis not present

## 2018-10-20 DIAGNOSIS — R000 Tachycardia, unspecified: Secondary | ICD-10-CM | POA: Insufficient documentation

## 2018-10-20 DIAGNOSIS — K219 Gastro-esophageal reflux disease without esophagitis: Secondary | ICD-10-CM | POA: Diagnosis not present

## 2018-10-20 DIAGNOSIS — Z9049 Acquired absence of other specified parts of digestive tract: Secondary | ICD-10-CM | POA: Insufficient documentation

## 2018-10-20 DIAGNOSIS — Z885 Allergy status to narcotic agent status: Secondary | ICD-10-CM | POA: Diagnosis not present

## 2018-10-20 DIAGNOSIS — R933 Abnormal findings on diagnostic imaging of other parts of digestive tract: Secondary | ICD-10-CM | POA: Diagnosis present

## 2018-10-20 DIAGNOSIS — Z888 Allergy status to other drugs, medicaments and biological substances status: Secondary | ICD-10-CM | POA: Insufficient documentation

## 2018-10-20 DIAGNOSIS — G43909 Migraine, unspecified, not intractable, without status migrainosus: Secondary | ICD-10-CM | POA: Diagnosis not present

## 2018-10-20 DIAGNOSIS — Z808 Family history of malignant neoplasm of other organs or systems: Secondary | ICD-10-CM | POA: Insufficient documentation

## 2018-10-20 DIAGNOSIS — Z820 Family history of epilepsy and other diseases of the nervous system: Secondary | ICD-10-CM | POA: Insufficient documentation

## 2018-10-20 DIAGNOSIS — Z90710 Acquired absence of both cervix and uterus: Secondary | ICD-10-CM | POA: Diagnosis not present

## 2018-10-20 DIAGNOSIS — K6389 Other specified diseases of intestine: Secondary | ICD-10-CM | POA: Diagnosis not present

## 2018-10-20 DIAGNOSIS — Z79899 Other long term (current) drug therapy: Secondary | ICD-10-CM | POA: Diagnosis not present

## 2018-10-20 DIAGNOSIS — F319 Bipolar disorder, unspecified: Secondary | ICD-10-CM | POA: Insufficient documentation

## 2018-10-20 DIAGNOSIS — R011 Cardiac murmur, unspecified: Secondary | ICD-10-CM | POA: Diagnosis not present

## 2018-10-20 DIAGNOSIS — G709 Myoneural disorder, unspecified: Secondary | ICD-10-CM | POA: Insufficient documentation

## 2018-10-20 DIAGNOSIS — M1990 Unspecified osteoarthritis, unspecified site: Secondary | ICD-10-CM | POA: Insufficient documentation

## 2018-10-20 DIAGNOSIS — Z833 Family history of diabetes mellitus: Secondary | ICD-10-CM | POA: Insufficient documentation

## 2018-10-20 DIAGNOSIS — Z7951 Long term (current) use of inhaled steroids: Secondary | ICD-10-CM | POA: Diagnosis not present

## 2018-10-20 HISTORY — PX: COLONOSCOPY WITH PROPOFOL: SHX5780

## 2018-10-20 SURGERY — COLONOSCOPY WITH PROPOFOL
Anesthesia: General

## 2018-10-20 MED ORDER — KETOROLAC TROMETHAMINE 30 MG/ML IJ SOLN
INTRAMUSCULAR | Status: DC | PRN
  Administered 2018-10-20: 15 mg via INTRAVENOUS

## 2018-10-20 MED ORDER — DIPHENHYDRAMINE HCL 50 MG/ML IJ SOLN
INTRAMUSCULAR | Status: AC
  Filled 2018-10-20: qty 1

## 2018-10-20 MED ORDER — PROPOFOL 10 MG/ML IV BOLUS
INTRAVENOUS | Status: DC | PRN
  Administered 2018-10-20: 80 mg via INTRAVENOUS

## 2018-10-20 MED ORDER — PROPOFOL 500 MG/50ML IV EMUL
INTRAVENOUS | Status: AC
  Filled 2018-10-20: qty 50

## 2018-10-20 MED ORDER — KETOROLAC TROMETHAMINE 30 MG/ML IJ SOLN
INTRAMUSCULAR | Status: AC
  Filled 2018-10-20: qty 1

## 2018-10-20 MED ORDER — SODIUM CHLORIDE 0.9 % IV SOLN
INTRAVENOUS | Status: DC
  Administered 2018-10-20: 1000 mL via INTRAVENOUS

## 2018-10-20 MED ORDER — EPINEPHRINE 1 MG/10ML IJ SOSY
PREFILLED_SYRINGE | INTRAMUSCULAR | Status: AC
  Filled 2018-10-20: qty 10

## 2018-10-20 MED ORDER — DIPHENHYDRAMINE HCL 50 MG/ML IJ SOLN
INTRAMUSCULAR | Status: DC | PRN
  Administered 2018-10-20 (×2): 25 mg via INTRAVENOUS

## 2018-10-20 MED ORDER — PROPOFOL 500 MG/50ML IV EMUL
INTRAVENOUS | Status: DC | PRN
  Administered 2018-10-20: 100 ug/kg/min via INTRAVENOUS

## 2018-10-20 NOTE — Anesthesia Preprocedure Evaluation (Signed)
Anesthesia Evaluation  Patient identified by MRN, date of birth, ID band Patient awake    Reviewed: Allergy & Precautions, NPO status , Patient's Chart, lab work & pertinent test results, reviewed documented beta blocker date and time   History of Anesthesia Complications (+) PROLONGED EMERGENCE and history of anesthetic complications  Airway Mallampati: I  TM Distance: <3 FB     Dental  (+) Caps, Missing, Dental Advidsory Given   Pulmonary neg shortness of breath, COPD,  COPD inhaler, neg recent URI, Current Smoker and Patient abstained from smoking.,    Pulmonary exam normal        Cardiovascular Exercise Tolerance: Good (-) hypertension(-) angina(-) Past MI and (-) Cardiac Stents Normal cardiovascular exam(-) dysrhythmias + Valvular Problems/Murmurs      Neuro/Psych  Headaches, neg Seizures PSYCHIATRIC DISORDERS Anxiety Depression Bipolar Disorder  Neuromuscular disease    GI/Hepatic Neg liver ROS, GERD  Medicated,Barrett's esophagus   Endo/Other  negative endocrine ROS  Renal/GU negative Renal ROS  negative genitourinary   Musculoskeletal  (+) Arthritis , Osteoarthritis,    Abdominal Normal abdominal exam  (+)   Peds negative pediatric ROS (+)  Hematology negative hematology ROS (+)   Anesthesia Other Findings Past Medical History: No date: Adnexal mass No date: Anxiety No date: Barrett's esophagus No date: Bell palsy     Comment:  left foot also ,  No date: Bronchitis No date: Complication of anesthesia     Comment:  difficulty waking up No date: Degenerative disc disease, cervical No date: Depression No date: Environmental and seasonal allergies No date: GERD (gastroesophageal reflux disease) No date: High triglycerides No date: Migraines No date: Migraines No date: Rapid heart rate  Reproductive/Obstetrics                             Anesthesia Physical  Anesthesia  Plan  ASA: II  Anesthesia Plan: General   Post-op Pain Management:    Induction: Intravenous  PONV Risk Score and Plan: 3 and Propofol infusion and TIVA  Airway Management Planned: Natural Airway and Nasal Cannula  Additional Equipment:   Intra-op Plan:   Post-operative Plan:   Informed Consent: I have reviewed the patients History and Physical, chart, labs and discussed the procedure including the risks, benefits and alternatives for the proposed anesthesia with the patient or authorized representative who has indicated his/her understanding and acceptance.     Dental advisory given  Plan Discussed with: CRNA and Surgeon  Anesthesia Plan Comments:         Anesthesia Quick Evaluation  

## 2018-10-20 NOTE — Anesthesia Postprocedure Evaluation (Signed)
Anesthesia Post Note  Patient: Jazma Raimondo  Procedure(s) Performed: COLONOSCOPY WITH PROPOFOL (N/A )  Patient location during evaluation: Endoscopy Anesthesia Type: General Level of consciousness: awake and alert Pain management: pain level controlled Vital Signs Assessment: post-procedure vital signs reviewed and stable Respiratory status: spontaneous breathing, nonlabored ventilation, respiratory function stable and patient connected to nasal cannula oxygen Cardiovascular status: blood pressure returned to baseline and stable Postop Assessment: no apparent nausea or vomiting Anesthetic complications: no     Last Vitals:  Vitals:   10/20/18 0831 10/20/18 0846  BP: (!) 87/57 93/64  Pulse: 65   Resp: 16   Temp:    SpO2: 100%     Last Pain:  Vitals:   10/20/18 0841  TempSrc:   PainSc: 9                  Martha Clan

## 2018-10-20 NOTE — Op Note (Signed)
[facility] Gastroenterology Patient Name: Jordan Ortega Procedure Date: 10/20/2018 7:57 AM MRN: [id number] Account #: [id number] Date of Birth: March 28, 1955 Admit Type: Outpatient Age: 63 Room: [facility] ENDO ROOM 4 Gender: Female Note Status: Finalized Procedure:            Colonoscopy Indications:          Abnormal CT of the GI tract Providers:            [name] MD, MD Medicines:            Monitored Anesthesia Care Complications:        No immediate complications. Procedure:            Pre-Anesthesia Assessment:                       - Prior to the procedure, a History and Physical was                        performed, and patient medications, allergies and                        sensitivities were reviewed. The patient's tolerance of                        previous anesthesia was reviewed.                       - The risks and benefits of the procedure and the                        sedation options and risks were discussed with the                        patient. All questions were answered and informed                        consent was obtained.                       - ASA Grade Assessment: III - A patient with severe                        systemic disease.                       After obtaining informed consent, the colonoscope was                        passed under direct vision. Throughout the procedure,                        the patient's blood pressure, pulse, and oxygen                        saturations were monitored continuously. The                        Colonoscope was introduced through the anus and                        advanced to the the terminal ileum. The colonoscopy was  technically difficult and complex due to a tortuous                        colon. The patient tolerated the procedure well. The                        quality of the bowel preparation was excellent. Findings:      The perianal and digital rectal  examinations were normal.      The entire examined colon appeared normal on direct and retroflexion       views. Impression:           - The entire examined colon is normal on direct and                        retroflexion views.                       - No specimens collected. Recommendation:       - Discharge patient to home (with escort).                       - Resume previous diet.                       - Continue present medications.                       - Repeat colonoscopy in 5 years for screening purposes.                       - Return to GI office in 3 weeks. Procedure Code(s):    --- Professional ---                       [phone], Colonoscopy, flexible; diagnostic, including                        collection of specimen(s) by brushing or washing, when                        performed (separate procedure) Diagnosis Code(s):    --- Professional ---                       R93.3, Abnormal findings on diagnostic imaging of other                        parts of digestive tract CPT copyright 2019 American Medical Association. All rights reserved. The codes documented in this report are preliminary and upon coder review may  be revised to meet current compliance requirements. [name], MD [name] MD, MD 10/20/2018 8:29:11 AM This report has been signed electronically. Number of Addenda: 0 Note Initiated On: 10/20/2018 7:57 AM Scope Withdrawal Time: 0 hours 11 minutes 38 seconds  Total Procedure Duration: 0 hours 22 minutes 15 seconds  Estimated Blood Loss: Estimated blood loss: none.      [facility]

## 2018-10-20 NOTE — H&P (Signed)
[name], MD [address] [place], [place], [place], [address] [address], [place], [place], [place], [address] Phone: [phone]  Fax: [phone]  Primary Care Physician:  Mar Daring, PA-C   Pre-Procedure History & Physical: HPI:  Jordan Ortega is a 63 y.o. female is here for an colonoscopy.   Past Medical History:  Diagnosis Date   Adnexal mass    Anxiety    Barrett's esophagus    Bell palsy    left foot also ,    Bronchitis    Complication of anesthesia    difficulty waking up   COPD (chronic obstructive pulmonary disease) (HCC)    Degenerative disc disease, cervical    Depression    Environmental and seasonal allergies    GERD (gastroesophageal reflux disease)    Heart murmur    Hx   High triglycerides    Migraines    Migraines    Rapid heart rate     Past Surgical History:  Procedure Laterality Date   ABDOMINAL HYSTERECTOMY     APPENDECTOMY     CARDIAC CATHETERIZATION     [facility] pt doesn't have any stents or recall years ago 20+ yrs ago   CHOLECYSTECTOMY     COLONOSCOPY WITH PROPOFOL N/A 07/16/2016   Procedure: COLONOSCOPY WITH PROPOFOL;  Surgeon: [name], MD;  Location[facility] ENDOSCOPY;  Service: Endoscopy;  Laterality: N/A;   ESOPHAGOGASTRODUODENOSCOPY (EGD) WITH PROPOFOL N/A 07/16/2016   Procedure: ESOPHAGOGASTRODUODENOSCOPY (EGD) WITH PROPOFOL;  Surgeon: [name], MD;  Location: [facility] ENDOSCOPY;  Service: Endoscopy;  Laterality: N/A;   GALLBLADDER SURGERY  1980   LAPAROSCOPIC SALPINGO OOPHERECTOMY Bilateral 08/27/2016   Procedure: LAPAROSCOPIC SALPINGO OOPHORECTOMY;  Surgeon: Gae Dry, MD;  Location: [facility] ORS;  Service: Gynecology;  Laterality: Bilateral;   TUBAL LIGATION  1983    Prior to Admission medications   Medication Sig Start Date End Date Taking? Authorizing Provider  albuterol (PROVENTIL HFA;VENTOLIN HFA) 108 (90 Base) MCG/ACT inhaler Inhale 2 puffs into the lungs every 6 (six)  hours as needed for wheezing or shortness of breath. 11/01/17  Yes Chrismon, Vickki Muff, PA  ALPRAZolam (XANAX) 0.5 MG tablet Take 1 tablet (0.5 mg total) by mouth 3 (three) times daily as needed for anxiety. 11/19/16  Yes Chrismon, Vickki Muff, PA  Ascorbic Acid (VITAMIN C) 1000 MG tablet Take 1,000 mg by mouth daily.   Yes [provider]  baclofen (LIORESAL) 10 MG tablet TAKE 1 TABLET (10 MG TOTAL) BY MOUTH 2 (TWO) TIMES DAILY 03/25/16  Yes [provider]  budesonide-formoterol (SYMBICORT) 160-4.5 MCG/ACT inhaler Inhale 1 puff into the lungs 2 (two) times daily. 03/21/17  Yes Chrismon, Vickki Muff, PA  DULoxetine (CYMBALTA) 20 MG capsule Take by mouth. 06/13/18  Yes [provider]  Erenumab-aooe (AIMOVIG) 140 MG/ML SOAJ Inject 140 mg into the skin every 28 (twenty-eight) days.   Yes [provider]  gabapentin (NEURONTIN) 100 MG capsule Take 300 mg by mouth at bedtime.    Yes [provider]  HYDROcodone-acetaminophen (NORCO/VICODIN) 5-325 MG tablet Take 1 tablet by mouth every 6 (six) hours as needed. For pain 10/17/18  Yes Mar Daring, PA-C  lipase/protease/amylase (CREON) 36000 UNITS CPEP capsule Take 2 caps with each meal and 1 with a snack 09/06/18  Yes [name], MD  loratadine (CLARITIN) 10 MG tablet Take 10 mg by mouth 2 (two) times daily.   Yes [provider]  Multiple Vitamin (MULTIVITAMIN) tablet Take 1 tablet  by mouth daily.   Yes [provider]  pantoprazole (PROTONIX) 40 MG tablet TAKE 1 TABLET BY MOUTH ONCE DAILY 10/09/18  Yes Chrismon, Vickki Muff, PA  Probiotic Product (PROBIOTIC-10) CHEW Chew 2 tablets by mouth daily.   Yes [provider]  promethazine (PHENERGAN) 25 MG tablet Take 1 tablet (25 mg total) by mouth every 8 (eight) hours as needed for nausea or vomiting. 10/17/18  Yes Mar Daring, PA-C  QUEtiapine (SEROQUEL) 50 MG tablet Take 1 tablet (50 mg total) by mouth at bedtime. Patient taking  differently: Take 50 mg by mouth at bedtime. Takes 2 72m tablets 04/19/16  Yes Chrismon, DVickki Muff PA  simvastatin (ZOCOR) 40 MG tablet TAKE 1 TABLET BY MOUTH AT BEDTIME 09/05/18  Yes BMar Daring PA-C  Specialty Vitamins Products (BIOTIN PLUS KERATIN) 10000-100 MCG-MG TABS Take 1 tablet by mouth daily.   Yes [provider]    Allergies as of 10/17/2018 - Review Complete 10/17/2018  Allergen Reaction Noted   Diazepam Other (See Comments) 11/20/2009   Ciprofloxacin Other (See Comments) 06/21/2013   Codeine Hives and Itching 11/20/2009   Metoprolol  07/10/2014   Morphine Hives 11/20/2009   Niacin and related Other (See Comments) 11/28/2011   Topiramate Other (See Comments) 11/01/2017   Toradol [ketorolac tromethamine] Itching 07/10/2014   Doxycycline Diarrhea 03/21/2018   Iodinated diagnostic agents Hives 07/11/2014    Family History  Problem Relation Age of Onset   Breast cancer Mother 724  Skin cancer Mother    Hypothyroidism Mother    Parkinson's disease Mother    Migraines Sister    Colon cancer Father    Lung cancer Father    Heart disease Father    Diabetes Paternal Grandmother    Hypertension Paternal Grandmother    Stroke Paternal Grandmother    Dementia Paternal Grandfather    Breast cancer Maternal Aunt        post men.   Diabetes Paternal Uncle    Breast cancer Cousin 576      maternal    Multiple myeloma Cousin     Social History   Socioeconomic History   Marital status: Married    Spouse name: Not on file   Number of children: Not on file   Years of education: Not on file   Highest education level: Not on file  Occupational History   Not on file  Social Needs   Financial resource strain: Not hard at all   Food insecurity    Worry: Never true    Inability: Never true   Transportation needs    Medical: No    Non-medical: No  Tobacco Use   Smoking status: Current Every Day Smoker    Packs/day:  1.00    Years: 30.00    Pack years: 30.00    Types: Cigarettes   Smokeless tobacco: Never Used  Substance and Sexual Activity   Alcohol use: Not Currently    Alcohol/week: 0.0 standard drinks   Drug use: No   Sexual activity: Not on file  Lifestyle   Physical activity    Days per week: 0 days    Minutes per session: 0 min   Stress: Not on file  Relationships   Social connections    Talks on phone: Patient refused    Gets together: Patient refused    Attends religious service: Patient refused    Active member of club or organization: Patient refused    Attends meetings of clubs  or organizations: Patient refused    Relationship status: Patient refused   Intimate partner violence    Fear of current or ex partner: Patient refused    Emotionally abused: Patient refused    Physically abused: Patient refused    Forced sexual activity: Patient refused  Other Topics Concern   Not on file  Social History Narrative   Not on file    Review of Systems: See HPI, otherwise negative ROS  Physical Exam: BP 106/75    Pulse 87    Temp (!) 96.9 F (36.1 C) (Tympanic)    Resp 20    Ht 5' (1.524 m)    Wt 39.5 kg    SpO2 99%    BMI 16.99 kg/m  General:   Alert,  pleasant and cooperative in NAD Head:  Normocephalic and atraumatic. Neck:  Supple; no masses or thyromegaly. Lungs:  Clear throughout to auscultation, normal respiratory effort.    Heart:  +S1, +S2, Regular rate and rhythm, No edema. Abdomen:  Soft, nontender and nondistended. Normal bowel sounds, without guarding, and without rebound.   Neurologic:  Alert and  oriented x4;  grossly normal neurologically.  Impression/Plan: Saniyah Mondesir is here for an colonoscopy to be performed for abnormal CT scan  Risks, benefits, limitations, and alternatives regarding  colonoscopy have been reviewed with the patient.  Questions have been answered.  All parties agreeable.   [name], MD  10/20/2018, 7:50 AM

## 2018-10-20 NOTE — Anesthesia Post-op Follow-up Note (Signed)
Anesthesia QCDR form completed.        

## 2018-10-20 NOTE — Transfer of Care (Signed)
Immediate Anesthesia Transfer of Care Note  [name]  Procedure(s) Performed: COLONOSCOPY WITH PROPOFOL (N/A )  Patient Location: Endoscopy Unit  Anesthesia Type:General  Level of Consciousness: drowsy and patient cooperative  Airway & Oxygen Therapy: Patient Spontanous Breathing and Patient connected to nasal cannula oxygen  Post-op Assessment: Report given to RN and Post -op Vital signs reviewed and stable  Post vital signs: Reviewed and stable  Last Vitals:  Vitals Value Taken Time  BP 87/57 10/20/18 0831  Temp    Pulse 65 10/20/18 0831  Resp 21 10/20/18 0831  SpO2 100 % 10/20/18 0831  Vitals shown include unvalidated device data.  Last Pain:  Vitals:   10/20/18 0650  TempSrc: Tympanic  PainSc: 9          Complications: No apparent anesthesia complications

## 2018-10-23 ENCOUNTER — Encounter: Payer: Self-pay | Admitting: Gastroenterology

## 2018-10-23 ENCOUNTER — Other Ambulatory Visit: Payer: Self-pay | Admitting: Oncology

## 2018-10-25 ENCOUNTER — Telehealth: Payer: Self-pay | Admitting: *Deleted

## 2018-10-25 NOTE — Telephone Encounter (Signed)
I have discussed the case with Dr.Anna. recommend monitor. I will see her in December or earlier if needed.  She has a follow up with Dr.Anna and he will discuss. Thank you.

## 2018-10-25 NOTE — Telephone Encounter (Signed)
Patient called reporting that she had her colonoscopy Friday and wants to know what is next. Please return her call [phone]

## 2018-10-25 NOTE — Telephone Encounter (Signed)
Call returned to patient and informed of Dr Collie Siad response

## 2018-10-26 ENCOUNTER — Ambulatory Visit
Admission: RE | Admit: 2018-10-26 | Discharge: 2018-10-26 | Disposition: A | Discharge status: Home / Self Care | Payer: BC Managed Care – PPO | Source: Ambulatory Visit | Attending: Physician Assistant | Admitting: Physician Assistant

## 2018-10-26 DIAGNOSIS — Z1231 Encounter for screening mammogram for malignant neoplasm of breast: Secondary | ICD-10-CM | POA: Insufficient documentation

## 2018-10-27 ENCOUNTER — Telehealth: Payer: Self-pay

## 2018-10-27 NOTE — Telephone Encounter (Signed)
Pt advised.   Thanks,   -Tasheka Houseman  

## 2018-10-27 NOTE — Telephone Encounter (Signed)
-----   Message from Mar Daring, [place] sent at 10/26/2018  4:58 PM EDT ----- Normal mammogram. Repeat screening in one year.

## 2018-10-30 ENCOUNTER — Encounter: Payer: Self-pay | Admitting: Gastroenterology

## 2018-10-30 ENCOUNTER — Other Ambulatory Visit: Payer: Self-pay

## 2018-10-30 ENCOUNTER — Ambulatory Visit: Payer: BC Managed Care – PPO | Admitting: Gastroenterology

## 2018-10-30 VITALS — BP 112/65 | HR 76 | Temp 98.2°F | Wt 90.2 lb

## 2018-10-30 DIAGNOSIS — R634 Abnormal weight loss: Secondary | ICD-10-CM | POA: Diagnosis not present

## 2018-10-30 NOTE — Progress Notes (Signed)
[name] MD, MRCP(U.K) [address]  [place]  [place], [place] [address]  Main: [phone]  Fax: [phone]   Primary Care Physician: Mar Daring, PA-C  Primary Gastroenterologist:  Dr. [name]   Chief Complaint  Patient presents with   Weight Loss    Patient lost another pound since last office visit. She has had abdominal pain at belly button when she eats. She has had nausea     HPI: Jordan Ortega is a 63 y.o. female   Abnormal CT scan of the abdomen  HPI: Jordan Ortega is a 63 y.o. female who I have been following as an outpatient for irritable bowel syndrome.  I have treated on her also for constipation and overflow diarrhea.  She did mention to me concerns of unintentional weight loss and I did try obtain a CT scan of the abdomen but unfortunately was not authorized by her insurance provider.  I referred the patient to Dr. Tasia Catchings in oncology and she underwent a CT scan of the abdomen on 10/13/2018 which demonstrated a 4.4 cm segment of mid transverse colon which exhibits circumferential wall thickening and has a maximum diameter of 2.3 cm.  Some mention of prestenotic dilation is identified within the proximal transverse colon.  Endoscopy was recommended to correlate for colonic neoplasm versus postinflammatory stricture.  I have previously performed an upper endoscopy and colonoscopy in June 2018 and found no abnormality in the transverse colon at that point of time.  She is here today to see me for to discuss the results.   Interval history   10/17/2018-  10/30/2018  10/20/2018: Colonoscopy : normal .  Normal mammogram 10/2018   Since her last visit she says she continues to have poor appetite, weight loss, nausea.  Has lost 1 more pound in weight.  States that she is only stressed from the weight loss.  Can keep some boost down.  BP 112/65 (BP Location: Left Arm, Patient Position: Sitting, Cuff Size: Normal)    Pulse 76    Temp 98.2 F (36.8  C) (Oral)    Wt 90 lb 4 oz (40.9 kg)    BMI 17.63 kg/m     Current Outpatient Medications  Medication Sig Dispense Refill   albuterol (PROVENTIL HFA;VENTOLIN HFA) 108 (90 Base) MCG/ACT inhaler Inhale 2 puffs into the lungs every 6 (six) hours as needed for wheezing or shortness of breath. 1 Inhaler 2   ALPRAZolam (XANAX) 0.5 MG tablet Take 1 tablet (0.5 mg total) by mouth 3 (three) times daily as needed for anxiety. 90 tablet 0   Ascorbic Acid (VITAMIN C) 1000 MG tablet Take 1,000 mg by mouth daily.     baclofen (LIORESAL) 10 MG tablet TAKE 1 TABLET (10 MG TOTAL) BY MOUTH 2 (TWO) TIMES DAILY  2   budesonide-formoterol (SYMBICORT) 160-4.5 MCG/ACT inhaler Inhale 1 puff into the lungs 2 (two) times daily. 1 Inhaler 0   DULoxetine (CYMBALTA) 20 MG capsule Take by mouth.     Erenumab-aooe (AIMOVIG) 140 MG/ML SOAJ Inject 140 mg into the skin every 28 (twenty-eight) days.     gabapentin (NEURONTIN) 100 MG capsule Take 300 mg by mouth at bedtime.      HYDROcodone-acetaminophen (NORCO/VICODIN) 5-325 MG tablet Take 1 tablet by mouth every 6 (six) hours as needed. For pain 30 tablet 0   lipase/protease/amylase (CREON) 36000 UNITS CPEP capsule Take 2 caps with each meal and 1 with a snack 210 capsule 3   loratadine (CLARITIN)  10 MG tablet Take 10 mg by mouth 2 (two) times daily.     Multiple Vitamin (MULTIVITAMIN) tablet Take 1 tablet by mouth daily.     pantoprazole (PROTONIX) 40 MG tablet TAKE 1 TABLET BY MOUTH ONCE DAILY 90 tablet 1   Probiotic Product (PROBIOTIC-10) CHEW Chew 2 tablets by mouth daily.     promethazine (PHENERGAN) 25 MG tablet Take 1 tablet (25 mg total) by mouth every 8 (eight) hours as needed for nausea or vomiting. 30 tablet 0   QUEtiapine (SEROQUEL) 50 MG tablet Take 1 tablet (50 mg total) by mouth at bedtime. (Patient taking differently: Take 50 mg by mouth at bedtime. Takes 2 25mg  tablets) 90 tablet 3   simvastatin (ZOCOR) 40 MG tablet TAKE 1 TABLET BY MOUTH AT  BEDTIME 30 tablet 5   Specialty Vitamins Products (BIOTIN PLUS KERATIN) 10000-100 MCG-MG TABS Take 1 tablet by mouth daily.     No current facility-administered medications for this visit.     Allergies as of 10/30/2018 - Review Complete 10/30/2018  Allergen Reaction Noted   Diazepam Other (See Comments) 11/20/2009   Ciprofloxacin Other (See Comments) 06/21/2013   Codeine Hives and Itching 11/20/2009   Metoprolol  07/10/2014   Morphine Hives 11/20/2009   Niacin and related Other (See Comments) 11/28/2011   Topiramate Other (See Comments) 11/01/2017   Toradol [ketorolac tromethamine] Itching 07/10/2014   Doxycycline Diarrhea 03/21/2018   Iodinated diagnostic agents Hives 07/11/2014    ROS:  General: Negative for anorexia, weight loss, fever, chills, fatigue, weakness. ENT: Negative for hoarseness, difficulty swallowing , nasal congestion. CV: Negative for chest pain, angina, palpitations, dyspnea on exertion, peripheral edema.  Respiratory: Negative for dyspnea at rest, dyspnea on exertion, cough, sputum, wheezing.  GI: See history of present illness. GU:  Negative for dysuria, hematuria, urinary incontinence, urinary frequency, nocturnal urination.  Endo: Negative for unusual weight change.    Physical Examination:   BP 112/65 (BP Location: Left Arm, Patient Position: Sitting, Cuff Size: Normal)    Pulse 76    Temp 98.2 F (36.8 C) (Oral)    Wt 90 lb 4 oz (40.9 kg)    BMI 17.63 kg/m   General: Well-nourished, well-developed in no acute distress.  Eyes: No icterus. Conjunctivae pink. Mouth: Oropharyngeal mucosa moist and pink , no lesions erythema or exudate. Lungs: Clear to auscultation bilaterally. Non-labored. Heart: Regular rate and rhythm, no murmurs rubs or gallops.  Abdomen: Bowel sounds are normal, nontender, nondistended, no hepatosplenomegaly or masses, no abdominal bruits or hernia , no rebound or guarding.   Extremities: No lower extremity edema. No  clubbing or deformities. Neuro: Alert and oriented x 3.  Grossly intact. Skin: Warm and dry, no jaundice.   Psych: Alert and cooperative, normal mood and affect.   Imaging Studies: Ct Abdomen Pelvis Wo Contrast  Result Date: 10/13/2018 CLINICAL DATA:  Abdominal pain and loss of weight. EXAM: CT CHEST, ABDOMEN AND PELVIS WITHOUT CONTRAST TECHNIQUE: Multidetector CT imaging of the chest, abdomen and pelvis was performed following the standard protocol without IV contrast. COMPARISON:  CT AP 08/09/2016. FINDINGS: CT CHEST FINDINGS Cardiovascular: No significant vascular findings. Normal heart size. No pericardial effusion. Mediastinum/Nodes: No enlarged mediastinal, hilar, or axillary lymph nodes. Thyroid gland, trachea, and esophagus demonstrate no significant findings. Lungs/Pleura: Small peripheral nodule in the lateral left upper lobe is nonspecific measuring 4 mm, image 69/3. Disc shaped nodule along the oblique fissure of the left lung is noted and likely represents an intrapulmonary lymph node. 2  mm lateral left lower lobe lung nodule is identified, image 110/3. No additional suspicious nodules or masses identified. Musculoskeletal: No chest wall mass or suspicious bone lesions identified. CT ABDOMEN PELVIS FINDINGS Hepatobiliary: No focal liver abnormality is seen. Status post cholecystectomy. No biliary dilatation. Pancreas: Unremarkable. No pancreatic ductal dilatation or surrounding inflammatory changes. Spleen: Normal in size without focal abnormality. Adrenals/Urinary Tract: Normal adrenal glands. No kidney stone or mass identified. No hydronephrosis. Urinary bladder is unremarkable. Stomach/Bowel: Stomach is within normal limits. No small bowel wall thickening, inflammation or distension. 4.4 cm segment of mid transverse colon with circumferential wall thickening is identified measuring 2.3 cm in diameter, image 55/4. No surrounding inflammatory changes identified. Mild pre stenotic dilatation  identified. No obstruction. Vascular/Lymphatic: Aortic atherosclerosis. No aneurysm. No abdominal no pelvic adenopathy identified. Reproductive: Status post hysterectomy. No adnexal masses. Other: No free fluid or fluid collections. Musculoskeletal: No acute or significant osseous findings. IMPRESSION: 1. There is a 4.4 cm segment of mid transverse colon which exhibits circumferential wall thickening and has a maximum diameter of 2.3 cm. Pre stenotic dilatation is identified within the more proximal transverse colon. Primary differential considerations include colonic neoplasm versus postinflammatory stricture. Correlation with colonoscopy advised. 2. Several small nonspecific pulmonary nodules are identified measuring less than 5 mm. No follow-up needed if patient is low-risk (and has no known or suspected primary neoplasm). Non-contrast chest CT can be considered in 12 months if patient is high-risk. This recommendation follows the consensus statement: Guidelines for Management of Incidental Pulmonary Nodules Detected on CT Images: From the Fleischner Society 2017; Radiology 2017; 284:228-243. 3.  Aortic Atherosclerosis (ICD10-I70.0). Electronically Signed   By: Kerby Moors M.D.   On: 10/13/2018 14:23   Ct Chest Wo Contrast  Result Date: 10/13/2018 CLINICAL DATA:  Abdominal pain and loss of weight. EXAM: CT CHEST, ABDOMEN AND PELVIS WITHOUT CONTRAST TECHNIQUE: Multidetector CT imaging of the chest, abdomen and pelvis was performed following the standard protocol without IV contrast. COMPARISON:  CT AP 08/09/2016. FINDINGS: CT CHEST FINDINGS Cardiovascular: No significant vascular findings. Normal heart size. No pericardial effusion. Mediastinum/Nodes: No enlarged mediastinal, hilar, or axillary lymph nodes. Thyroid gland, trachea, and esophagus demonstrate no significant findings. Lungs/Pleura: Small peripheral nodule in the lateral left upper lobe is nonspecific measuring 4 mm, image 69/3. Disc shaped  nodule along the oblique fissure of the left lung is noted and likely represents an intrapulmonary lymph node. 2 mm lateral left lower lobe lung nodule is identified, image 110/3. No additional suspicious nodules or masses identified. Musculoskeletal: No chest wall mass or suspicious bone lesions identified. CT ABDOMEN PELVIS FINDINGS Hepatobiliary: No focal liver abnormality is seen. Status post cholecystectomy. No biliary dilatation. Pancreas: Unremarkable. No pancreatic ductal dilatation or surrounding inflammatory changes. Spleen: Normal in size without focal abnormality. Adrenals/Urinary Tract: Normal adrenal glands. No kidney stone or mass identified. No hydronephrosis. Urinary bladder is unremarkable. Stomach/Bowel: Stomach is within normal limits. No small bowel wall thickening, inflammation or distension. 4.4 cm segment of mid transverse colon with circumferential wall thickening is identified measuring 2.3 cm in diameter, image 55/4. No surrounding inflammatory changes identified. Mild pre stenotic dilatation identified. No obstruction. Vascular/Lymphatic: Aortic atherosclerosis. No aneurysm. No abdominal no pelvic adenopathy identified. Reproductive: Status post hysterectomy. No adnexal masses. Other: No free fluid or fluid collections. Musculoskeletal: No acute or significant osseous findings. IMPRESSION: 1. There is a 4.4 cm segment of mid transverse colon which exhibits circumferential wall thickening and has a maximum diameter of 2.3 cm. Pre stenotic dilatation  is identified within the more proximal transverse colon. Primary differential considerations include colonic neoplasm versus postinflammatory stricture. Correlation with colonoscopy advised. 2. Several small nonspecific pulmonary nodules are identified measuring less than 5 mm. No follow-up needed if patient is low-risk (and has no known or suspected primary neoplasm). Non-contrast chest CT can be considered in 12 months if patient is  high-risk. This recommendation follows the consensus statement: Guidelines for Management of Incidental Pulmonary Nodules Detected on CT Images: From the Fleischner Society 2017; Radiology 2017; [id number]. 3.  Aortic Atherosclerosis (ICD10-I70.0). Electronically Signed   By: Kerby Moors M.D.   On: 10/13/2018 14:23   Mm 3d Screen Breast Bilateral  Result Date: 10/26/2018 CLINICAL DATA:  Screening. EXAM: DIGITAL SCREENING BILATERAL MAMMOGRAM WITH TOMO AND CAD COMPARISON:  Previous exam(s). ACR Breast Density Category c: The breast tissue is heterogeneously dense, which may obscure small masses. FINDINGS: There are no findings suspicious for malignancy. Images were processed with CAD. IMPRESSION: No mammographic evidence of malignancy. A result letter of this screening mammogram will be mailed directly to the patient. RECOMMENDATION: Screening mammogram in one year. (Code:[id number]) BI-RADS CATEGORY  1: Negative. Electronically Signed   By: Everlean Alstrom M.D.   On: 10/26/2018 16:44    Assessment and Plan:   Jordan Ortega is a 63 y.o. y/o female here to see me as a  follow-up with me for irritable bowel syndrome with constipation and possibly overflow diarrhea.  She also see me for unintentional weight loss but her insurance carrier denied request for the CT scan of the abdomen and subsequently was approved after being referred to oncology.  CT scan of the abdomen showed an abnormality in the transverse colon.  Some thickening has been noted.  I had performed a colonoscopy in June 2018 which was reported normal. Repeated 10/2018 and was also normal.  No concerns for malabsorption as she is not having any diarrhea ,rather she has constipation and not having adequate bowel movements despite being on a stool softener.  Plan   1. Check celiac serology, Linzess 72 mcg once a day for constipation. 2. Since colonoscopy was negative but the CT scan showed an abnormality in the transverse colon.  I would  suggest that we repeat the CT scan in about 3 weeks time to ensure that no abnormalities seen in the transverse colon which can at times be extracolonic and may not have been seen on a colonoscopy. Suggest to increase boost to 2 times a day samples provided 3.  I have discussed with Mar Daring, PA-C and she will arrange for an outpatient appointment to discuss options of appetite stimulants.  Dr [name]  MD,MRCP [facility]) Follow up in 4 weeks

## 2018-11-06 ENCOUNTER — Telehealth: Payer: Self-pay | Admitting: Gastroenterology

## 2018-11-06 NOTE — Telephone Encounter (Signed)
Patient called & was checking to see if Jordan Ortega had set her Ct scan up? Please call patient.

## 2018-11-09 ENCOUNTER — Other Ambulatory Visit: Payer: Self-pay

## 2018-11-09 MED ORDER — LINACLOTIDE 72 MCG PO CAPS: 72.0000 ug | ORAL_CAPSULE | Freq: Every day | ORAL | 1 refills | Status: DC

## 2018-11-23 ENCOUNTER — Ambulatory Visit
Admission: RE | Admit: 2018-11-23 | Discharge: 2018-11-23 | Disposition: A | Discharge status: Home / Self Care | Payer: BC Managed Care – PPO | Source: Ambulatory Visit | Attending: Gastroenterology | Admitting: Gastroenterology

## 2018-11-23 ENCOUNTER — Other Ambulatory Visit: Payer: Self-pay

## 2018-11-23 DIAGNOSIS — R634 Abnormal weight loss: Secondary | ICD-10-CM | POA: Diagnosis present

## 2018-11-24 ENCOUNTER — Telehealth: Payer: Self-pay | Admitting: Gastroenterology

## 2018-11-24 NOTE — Telephone Encounter (Signed)
Patient called & l/m on v/m asking for her he CT results.

## 2018-11-25 NOTE — Progress Notes (Signed)
Jadijah inform the abnormal area in the colon seen on prior CT is no longer seen which is good. 2 mm opacity stable seen in the lung   C/c Mar Daring, PA-C   Dr [name] MD,MRCP [facility]) Gastroenterology/Hepatology Pager: [phone]

## 2018-11-27 NOTE — Telephone Encounter (Signed)
Spoke with pt and informed her of CT scan results. 

## 2018-11-27 NOTE — Telephone Encounter (Signed)
-----   Message from [name], MD sent at 11/25/2018  6:34 PM EDT ----- Sherald Hess inform the abnormal area in the colon seen on prior CT is no longer seen which is good. 2 mm opacity stable seen in the lung   C/c Mar Daring, PA-C   Dr [name] MD,MRCP [facility]) Gastroenterology/Hepatology Pager: [phone]

## 2018-11-30 ENCOUNTER — Other Ambulatory Visit: Payer: Self-pay

## 2018-11-30 ENCOUNTER — Ambulatory Visit (INDEPENDENT_AMBULATORY_CARE_PROVIDER_SITE_OTHER): Payer: BC Managed Care – PPO | Admitting: Gastroenterology

## 2018-11-30 VITALS — BP 110/68 | HR 76 | Temp 98.6°F | Ht 60.0 in | Wt 87.6 lb

## 2018-11-30 DIAGNOSIS — R634 Abnormal weight loss: Secondary | ICD-10-CM | POA: Diagnosis not present

## 2018-11-30 DIAGNOSIS — R933 Abnormal findings on diagnostic imaging of other parts of digestive tract: Secondary | ICD-10-CM | POA: Diagnosis not present

## 2018-11-30 NOTE — Progress Notes (Signed)
[name] MD, MRCP(U.K) [address]  [place]  [place], [place] [address]  Main: [phone]  Fax: [phone]   Primary Care Physician: Mar Daring, PA-C  Primary Gastroenterologist:  Dr. [name]   Follow-up to discuss results of last tests ordered  HPI: Jordan Ortega is a 63 y.o. female   Jordan Kuether Summersis a 63 y.o.femalewho I have been following as an outpatient for irritable bowel syndrome. I have treated her  for constipation and overflow diarrhea. She did mention to me concerns of unintentional weight loss and I did try obtain a CT scan of the abdomen but unfortunately was not authorized by her insurance provider. I referred the patient to Dr. Tasia Catchings in oncology and she underwent a CT scan of the abdomen on 10/13/2018 which demonstrated a 4.4 cm segment of mid transverse colon which exhibits circumferential wall thickening and has a maximum diameter of 2.3 cm. Some mention of prestenotic dilation is identified within the proximal transverse colon. Endoscopy was recommended to correlate for colonic neoplasm versus postinflammatory stricture. I have previously performed an upper endoscopy and colonoscopy in June 2018 and found no abnormality in the transverse colon at that point of time. She is here today to see me for to discuss the results. 10/20/2018: Colonoscopy : normal .  Normal mammogram 10/2018   Interval history 10/30/2018-11/30/2018  11/23/2018: CT scan of the abdomen without contrast: Wall thickening of the distal gastric antrum suspect a degree of antral gastritis.  Borderline distention of the transverse colon which could indicate mild colonic ileus.  Previous thickening of the transverse colon seen on prior CT is no longer evident.  Stool fluid throughout the colon.  Gallbladder absent.  2 mm nodular opacity in the anterior segment of the left lower lobe.  The last visit she continues to lose weight she has lost 3 further pounds.  She  said that her appetite is poor.  She feels nauseous at times.  Never throws up.  Mostly for solids liquids reasonably okay. No abdominal pain.  Has constipation not responding to Linzess 72 mcg.  Current Outpatient Medications  Medication Sig Dispense Refill   albuterol (PROVENTIL HFA;VENTOLIN HFA) 108 (90 Base) MCG/ACT inhaler Inhale 2 puffs into the lungs every 6 (six) hours as needed for wheezing or shortness of breath. 1 Inhaler 2   ALPRAZolam (XANAX) 0.5 MG tablet Take 1 tablet (0.5 mg total) by mouth 3 (three) times daily as needed for anxiety. 90 tablet 0   Ascorbic Acid (VITAMIN C) 1000 MG tablet Take 1,000 mg by mouth daily.     baclofen (LIORESAL) 10 MG tablet TAKE 1 TABLET (10 MG TOTAL) BY MOUTH 2 (TWO) TIMES DAILY  2   budesonide-formoterol (SYMBICORT) 160-4.5 MCG/ACT inhaler Inhale 1 puff into the lungs 2 (two) times daily. 1 Inhaler 0   DULoxetine (CYMBALTA) 20 MG capsule Take by mouth.     Erenumab-aooe (AIMOVIG) 140 MG/ML SOAJ Inject 140 mg into the skin every 28 (twenty-eight) days.     gabapentin (NEURONTIN) 100 MG capsule Take 300 mg by mouth at bedtime.      HYDROcodone-acetaminophen (NORCO/VICODIN) 5-325 MG tablet Take 1 tablet by mouth every 6 (six) hours as needed. For pain 30 tablet 0   linaclotide (LINZESS) 72 MCG capsule Take 1 capsule (72 mcg total) by mouth daily before breakfast. 90 capsule 1   lipase/protease/amylase (CREON) 36000 UNITS CPEP capsule Take 2 caps with each meal and 1 with a snack 210 capsule 3  loratadine (CLARITIN) 10 MG tablet Take 10 mg by mouth 2 (two) times daily.     Multiple Vitamin (MULTIVITAMIN) tablet Take 1 tablet by mouth daily.     pantoprazole (PROTONIX) 40 MG tablet TAKE 1 TABLET BY MOUTH ONCE DAILY 90 tablet 1   Probiotic Product (PROBIOTIC-10) CHEW Chew 2 tablets by mouth daily.     promethazine (PHENERGAN) 25 MG tablet Take 1 tablet (25 mg total) by mouth every 8 (eight) hours as needed for nausea or vomiting. 30  tablet 0   QUEtiapine (SEROQUEL) 50 MG tablet Take 1 tablet (50 mg total) by mouth at bedtime. (Patient taking differently: Take 50 mg by mouth at bedtime. Takes 2 25mg  tablets) 90 tablet 3   simvastatin (ZOCOR) 40 MG tablet TAKE 1 TABLET BY MOUTH AT BEDTIME 30 tablet 5   Specialty Vitamins Products (BIOTIN PLUS KERATIN) 10000-100 MCG-MG TABS Take 1 tablet by mouth daily.     No current facility-administered medications for this visit.     Allergies as of 11/30/2018 - Review Complete 10/30/2018  Allergen Reaction Noted   Diazepam Other (See Comments) 11/20/2009   Ciprofloxacin Other (See Comments) 06/21/2013   Codeine Hives and Itching 11/20/2009   Metoprolol  07/10/2014   Morphine Hives 11/20/2009   Niacin and related Other (See Comments) 11/28/2011   Topiramate Other (See Comments) 11/01/2017   Toradol [ketorolac tromethamine] Itching 07/10/2014   Doxycycline Diarrhea 03/21/2018   Iodinated diagnostic agents Hives 07/11/2014    ROS:  General: Negative for anorexia, weight loss, fever, chills, fatigue, weakness. ENT: Negative for hoarseness, difficulty swallowing , nasal congestion. CV: Negative for chest pain, angina, palpitations, dyspnea on exertion, peripheral edema.  Respiratory: Negative for dyspnea at rest, dyspnea on exertion, cough, sputum, wheezing.  GI: See history of present illness. GU:  Negative for dysuria, hematuria, urinary incontinence, urinary frequency, nocturnal urination.  Endo: Negative for unusual weight change.    Physical Examination:   There were no vitals taken for this visit.  General: comfortable but very thin Eyes: No icterus. Conjunctivae pink. Mouth: Oropharyngeal mucosa moist and pink , no lesions erythema or exudate. Lungs: Clear to auscultation bilaterally. Non-labored. Heart: Regular rate and rhythm, no murmurs rubs or gallops.  Abdomen: Bowel sounds are normal, nontender, nondistended, no hepatosplenomegaly or masses, no  abdominal bruits or hernia , no rebound or guarding.   Extremities: No lower extremity edema. No clubbing or deformities. Neuro: Alert and oriented x 3.  Grossly intact. Skin: Warm and dry, no jaundice.   Psych: Alert and cooperative, normal mood and affect.   Imaging Studies: Ct Abdomen Pelvis Wo Contrast  Result Date: 11/23/2018 CLINICAL DATA:  Abdominal pain and weight loss EXAM: CT ABDOMEN AND PELVIS WITHOUT CONTRAST TECHNIQUE: Multidetector CT imaging of the abdomen and pelvis was performed following the standard protocol without IV contrast. Oral contrast was administered. COMPARISON:  October 13, 2018. FINDINGS: Lower chest: No edema or consolidation is evident in the lung base regions. There is a stable 2 mm nodular opacity in the anterior segment of the left lower lobe. Hepatobiliary: No focal liver lesions are demonstrable on this noncontrast enhanced study. Gallbladder is absent. There is no appreciable biliary duct dilatation. Pancreas: No pancreatic mass or inflammatory focus. Spleen: No splenic lesions are evident. Adrenals/Urinary Tract: Adrenals bilaterally appear normal. Kidneys bilaterally show no evident mass or hydronephrosis on either side. There is no evident renal or ureteral calculus on either side. Urinary bladder is midline with wall thickness within normal limits. Stomach/Bowel: There  is stool in fluid throughout the colon. There is borderline distension in the transverse colon which may indicate mild colonic ileus. The area of narrowing in the transverse colon seen on prior CT is no longer evident. There is currently wall thickening in the distal gastric antrum. No small bowel wall thickening noted. No evident bowel obstruction. Terminal ileum appears unremarkable. There is no demonstrable free air or portal venous air. Vascular/Lymphatic: There is no abdominal aortic aneurysm. There is aortic atherosclerosis. There is no appreciable adenopathy in the abdomen or pelvis.  Reproductive: Uterus is absent. There is no evident pelvic mass. Other: Appendix is absent. There is no periappendiceal region inflammation. No abscess or ascites evident in the abdomen or pelvis. Musculoskeletal: No blastic or lytic bone lesions are evident. No intramuscular or abdominal wall lesions appreciable. IMPRESSION: 1. There is currently wall thickening in the distal gastric antrum. Suspect a degree of antral gastritis. 2. There is borderline distension in the transverse colon which may indicate mild colonic ileus. The area of previous thickening in the transverse colon seen on prior CT is no longer evident. 3. There is stool in fluid throughout the colon. 4. Aortic atherosclerosis. 5. Stable 2 mm nodular opacity in the anterior segment of the left lower lobe. 6. Gallbladder absent. Aortic Atherosclerosis (ICD10-I70.0). Electronically Signed   By: Lowella Grip III M.D.   On: 11/23/2018 11:31    Assessment and Plan:   Forrest Tellado is a 63 y.o. y/o femalehere to follow-up for  unintentional weight loss .CT scan of the abdomen showed an abnormality in the transverse colon. Some thickening has been noted.  Colonoscopy 10/22/2018 showed no abnormalities in the transverse colon.  Repeat CT scan was performed again which shows no abnormality in the transverse colon but there is some thickening in the gastric antrum suspected gastritis.  I have no concerns for malabsorption as she is not having any diarrhea ,rather she has constipation and not having adequate bowel movements despite being on a stool softener.  She has been on Linzess 72 mcg and has not helped her with her constipation  Plan   1. Check celiac serology 2.  Increase Linzess to 145 mcg: Samples will be provided. 3.  Will discuss with Mar Daring, PA-C for an appetite stimulant which we had previously discussed about which the patient has not received.  I will discuss with Dr. Tasia Catchings if anything else needs to be done from  her end  4.  EGD to evaluate abnormality seen on CT scan  I have discussed alternative options, risks & benefits,  which include, but are not limited to, bleeding, infection, perforation,respiratory complication & drug reaction.  The patient agrees with this plan & written consent will be obtained.    Dr [name]  MD,MRCP [facility]) Follow up in 3 months

## 2018-12-01 ENCOUNTER — Other Ambulatory Visit
Admission: RE | Admit: 2018-12-01 | Discharge: 2018-12-01 | Disposition: A | Discharge status: Home / Self Care | Payer: BC Managed Care – PPO | Source: Ambulatory Visit | Attending: Gastroenterology | Admitting: Gastroenterology

## 2018-12-01 DIAGNOSIS — Z20828 Contact with and (suspected) exposure to other viral communicable diseases: Secondary | ICD-10-CM | POA: Insufficient documentation

## 2018-12-01 DIAGNOSIS — Z01812 Encounter for preprocedural laboratory examination: Secondary | ICD-10-CM | POA: Diagnosis present

## 2018-12-01 LAB — SARS CORONAVIRUS 2 (TAT 6-24 HRS): SARS Coronavirus 2: NEGATIVE

## 2018-12-02 LAB — CELIAC DISEASE AB SCREEN W/RFX
Antigliadin Abs, IgA: 2 units (ref 0–19)
IgA/Immunoglobulin A, Serum: 54 mg/dL — ABNORMAL LOW (ref 87–352)
Transglutaminase IgA: <2 U/mL (ref 0–3)

## 2018-12-04 ENCOUNTER — Encounter: Payer: Self-pay | Admitting: Gastroenterology

## 2018-12-04 ENCOUNTER — Encounter: Payer: Self-pay | Admitting: *Deleted

## 2018-12-05 ENCOUNTER — Other Ambulatory Visit: Payer: Self-pay

## 2018-12-05 ENCOUNTER — Ambulatory Visit: Payer: BC Managed Care – PPO | Admitting: Anesthesiology

## 2018-12-05 ENCOUNTER — Encounter: Payer: Self-pay | Admitting: *Deleted

## 2018-12-05 ENCOUNTER — Encounter
Admission: RE | Disposition: A | Discharge status: Home / Self Care | Payer: Self-pay | Source: Home / Self Care | Attending: Gastroenterology

## 2018-12-05 ENCOUNTER — Ambulatory Visit
Admission: RE | Admit: 2018-12-05 | Discharge: 2018-12-05 | Disposition: A | Discharge status: Home / Self Care | Payer: BC Managed Care – PPO | Attending: Gastroenterology | Admitting: Gastroenterology

## 2018-12-05 DIAGNOSIS — Z888 Allergy status to other drugs, medicaments and biological substances status: Secondary | ICD-10-CM | POA: Diagnosis not present

## 2018-12-05 DIAGNOSIS — Z91041 Radiographic dye allergy status: Secondary | ICD-10-CM | POA: Diagnosis not present

## 2018-12-05 DIAGNOSIS — F419 Anxiety disorder, unspecified: Secondary | ICD-10-CM | POA: Insufficient documentation

## 2018-12-05 DIAGNOSIS — Z885 Allergy status to narcotic agent status: Secondary | ICD-10-CM | POA: Insufficient documentation

## 2018-12-05 DIAGNOSIS — Z79899 Other long term (current) drug therapy: Secondary | ICD-10-CM | POA: Insufficient documentation

## 2018-12-05 DIAGNOSIS — Z8719 Personal history of other diseases of the digestive system: Secondary | ICD-10-CM | POA: Diagnosis not present

## 2018-12-05 DIAGNOSIS — R933 Abnormal findings on diagnostic imaging of other parts of digestive tract: Secondary | ICD-10-CM

## 2018-12-05 DIAGNOSIS — K2950 Unspecified chronic gastritis without bleeding: Secondary | ICD-10-CM | POA: Diagnosis not present

## 2018-12-05 DIAGNOSIS — J449 Chronic obstructive pulmonary disease, unspecified: Secondary | ICD-10-CM | POA: Insufficient documentation

## 2018-12-05 DIAGNOSIS — K219 Gastro-esophageal reflux disease without esophagitis: Secondary | ICD-10-CM | POA: Diagnosis not present

## 2018-12-05 DIAGNOSIS — F319 Bipolar disorder, unspecified: Secondary | ICD-10-CM | POA: Diagnosis not present

## 2018-12-05 DIAGNOSIS — F17210 Nicotine dependence, cigarettes, uncomplicated: Secondary | ICD-10-CM | POA: Insufficient documentation

## 2018-12-05 DIAGNOSIS — R634 Abnormal weight loss: Secondary | ICD-10-CM

## 2018-12-05 DIAGNOSIS — Z7951 Long term (current) use of inhaled steroids: Secondary | ICD-10-CM | POA: Insufficient documentation

## 2018-12-05 HISTORY — PX: ESOPHAGOGASTRODUODENOSCOPY (EGD) WITH PROPOFOL: SHX5813

## 2018-12-05 SURGERY — ESOPHAGOGASTRODUODENOSCOPY (EGD) WITH PROPOFOL
Anesthesia: General

## 2018-12-05 MED ORDER — SODIUM CHLORIDE 0.9 % IV SOLN
INTRAVENOUS | Status: DC
  Administered 2018-12-05: 08:00:00 via INTRAVENOUS

## 2018-12-05 MED ORDER — PROPOFOL 500 MG/50ML IV EMUL
INTRAVENOUS | Status: DC | PRN
  Administered 2018-12-05: 175 ug/kg/min via INTRAVENOUS

## 2018-12-05 MED ORDER — ONDANSETRON HCL 4 MG/2ML IJ SOLN
INTRAMUSCULAR | Status: AC
  Administered 2018-12-05: 4 mg
  Filled 2018-12-05: qty 2

## 2018-12-05 MED ORDER — PROPOFOL 10 MG/ML IV BOLUS
INTRAVENOUS | Status: DC | PRN
  Administered 2018-12-05: 50 mg via INTRAVENOUS

## 2018-12-05 MED ORDER — LIDOCAINE HCL (CARDIAC) PF 100 MG/5ML IV SOSY
PREFILLED_SYRINGE | INTRAVENOUS | Status: DC | PRN
  Administered 2018-12-05: 50 mg via INTRAVENOUS

## 2018-12-05 NOTE — H&P (Signed)
Jordan Bellows, MD [address] [place], [place], [place], [address] [address], [place], [place], [place], [address] Phone: [phone]  Fax: [phone]  Primary Care Physician:  Mar Daring, PA-C   Pre-Procedure History & Physical: HPI:  Jordan Ortega is a 63 y.o. female is here for an endoscopy    Past Medical History:  Diagnosis Date  . Adnexal mass   . Anxiety   . Barrett's esophagus   . Bell palsy    left foot also ,   . Bronchitis   . Complication of anesthesia    difficulty waking up  . COPD (chronic obstructive pulmonary disease) ([place])   . Degenerative disc disease, cervical   . Depression   . Environmental and seasonal allergies   . GERD (gastroesophageal reflux disease)   . Heart murmur    Hx  . High triglycerides   . Migraines   . Migraines   . Rapid heart rate     Past Surgical History:  Procedure Laterality Date  . ABDOMINAL HYSTERECTOMY    . APPENDECTOMY    . CARDIAC CATHETERIZATION     [facility] pt doesn't have any stents or recall years ago 20+ yrs ago  . CHOLECYSTECTOMY    . COLONOSCOPY WITH PROPOFOL N/A 07/16/2016   Procedure: COLONOSCOPY WITH PROPOFOL;  Surgeon: Jordan Bellows, MD;  Location: [facility] ENDOSCOPY;  Service: Endoscopy;  Laterality: N/A;  . COLONOSCOPY WITH PROPOFOL N/A 10/20/2018   Procedure: COLONOSCOPY WITH PROPOFOL;  Surgeon: Jordan Bellows, MD;  Location: [facility] ENDOSCOPY;  Service: Gastroenterology;  Laterality: N/A;  . DIAGNOSTIC LAPAROSCOPY    . ESOPHAGOGASTRODUODENOSCOPY (EGD) WITH PROPOFOL N/A 07/16/2016   Procedure: ESOPHAGOGASTRODUODENOSCOPY (EGD) WITH PROPOFOL;  Surgeon: Jordan Bellows, MD;  Location: [facility] ENDOSCOPY;  Service: Endoscopy;  Laterality: N/A;  . [place]  . LAPAROSCOPIC SALPINGO OOPHERECTOMY Bilateral 08/27/2016   Procedure: LAPAROSCOPIC SALPINGO OOPHORECTOMY;  Surgeon: Gae Dry, MD;  Location: [facility] ORS;  Service: Gynecology;  Laterality: Bilateral;  . TUBAL LIGATION  1983     Prior to Admission medications   Medication Sig Start Date End Date Taking? Authorizing Provider  albuterol (PROVENTIL HFA;VENTOLIN HFA) 108 (90 Base) MCG/ACT inhaler Inhale 2 puffs into the lungs every 6 (six) hours as needed for wheezing or shortness of breath. 11/01/17  Yes Chrismon, Vickki Muff, PA  ALPRAZolam (XANAX) 0.5 MG tablet Take 1 tablet (0.5 mg total) by mouth 3 (three) times daily as needed for anxiety. 11/19/16  Yes Chrismon, Vickki Muff, PA  Ascorbic Acid (VITAMIN C) 1000 MG tablet Take 1,000 mg by mouth daily.   Yes [provider]  baclofen (LIORESAL) 10 MG tablet TAKE 1 TABLET (10 MG TOTAL) BY MOUTH 2 (TWO) TIMES DAILY 03/25/16  Yes [provider]  budesonide-formoterol (SYMBICORT) 160-4.5 MCG/ACT inhaler Inhale 1 puff into the lungs 2 (two) times daily. 03/21/17  Yes Chrismon, Vickki Muff, PA  DULoxetine (CYMBALTA) 20 MG capsule Take by mouth. 06/13/18  Yes [provider]  Erenumab-aooe (AIMOVIG) 140 MG/ML SOAJ Inject 140 mg into the skin every 28 (twenty-eight) days.   Yes [provider]  gabapentin (NEURONTIN) 100 MG capsule Take 300 mg by mouth at bedtime.    Yes [provider]  HYDROcodone-acetaminophen (NORCO/VICODIN) 5-325 MG tablet Take 1 tablet by mouth every 6 (six) hours as needed. For pain 10/17/18  Yes Mar Daring, PA-C  linaclotide [facility]) 72 MCG capsule Take 1 capsule (72 mcg total) by mouth daily before breakfast. 11/09/18  05/08/19 Yes Jordan Bellows, MD  lipase/protease/amylase (CREON) [phone] UNITS CPEP capsule Take 2 caps with each meal and 1 with a snack 09/06/18  Yes Jordan Bellows, MD  loratadine (CLARITIN) 10 MG tablet Take 10 mg by mouth 2 (two) times daily.   Yes [provider]  Multiple Vitamin (MULTIVITAMIN) tablet Take 1 tablet by mouth daily.   Yes [provider]  pantoprazole (PROTONIX) 40 MG tablet TAKE 1 TABLET BY MOUTH ONCE DAILY 10/09/18  Yes Chrismon, Vickki Muff, PA  Probiotic Product  (PROBIOTIC-10) CHEW Chew 2 tablets by mouth daily.   Yes [provider]  promethazine (PHENERGAN) 25 MG tablet Take 1 tablet (25 mg total) by mouth every 8 (eight) hours as needed for nausea or vomiting. 10/17/18  Yes Mar Daring, PA-C  QUEtiapine (SEROQUEL) 50 MG tablet Take 1 tablet (50 mg total) by mouth at bedtime. Patient taking differently: Take 50 mg by mouth at bedtime. Takes 2 65m tablets 04/19/16  Yes Chrismon, DVickki Muff PA  simvastatin (ZOCOR) 40 MG tablet TAKE 1 TABLET BY MOUTH AT BEDTIME 09/05/18  Yes BMar Daring PA-C  Specialty Vitamins Products (BIOTIN PLUS KERATIN) 10000-100 MCG-MG TABS Take 1 tablet by mouth daily.   Yes [provider]  traZODone (DESYREL) 100 MG tablet Take 100 mg by mouth at bedtime.   Yes [provider]    Allergies as of 11/30/2018 - Review Complete 11/30/2018  Allergen Reaction Noted  . Diazepam Other (See Comments) 11/20/2009  . Ciprofloxacin Other (See Comments) 06/21/2013  . Codeine Hives and Itching 11/20/2009  . Metoprolol  07/10/2014  . Morphine Hives 11/20/2009  . Niacin and related Other (See Comments) 11/28/2011  . Topiramate Other (See Comments) 11/01/2017  . Toradol [ketorolac tromethamine] Itching 07/10/2014  . Doxycycline Diarrhea 03/21/2018  . Iodinated diagnostic agents Hives 07/11/2014    Family History  Problem Relation Age of Onset  . Breast cancer Mother 763 . Skin cancer Mother   . Hypothyroidism Mother   . Parkinson's disease Mother   . Migraines Sister   . Colon cancer Father   . Lung cancer Father   . Heart disease Father   . Diabetes Paternal Grandmother   . Hypertension Paternal Grandmother   . Stroke Paternal Grandmother   . Dementia Paternal Grandfather   . Breast cancer Maternal Aunt        post men.  . Diabetes Paternal Uncle   . Breast cancer Cousin 574      maternal   . Multiple myeloma Cousin     Social History   Socioeconomic History  . Marital status:  Married    Spouse name: Not on file  . Number of children: Not on file  . Years of education: Not on file  . Highest education level: Not on file  Occupational History  . Not on file  Social Needs  . Financial resource strain: Not hard at all  . Food insecurity    Worry: Never true    Inability: Never true  . Transportation needs    Medical: No    Non-medical: No  Tobacco Use  . Smoking status: Current Every Day Smoker    Packs/day: 0.50    Years: 30.00    Pack years: 15.00    Types: Cigarettes  . Smokeless tobacco: Never Used  Substance and Sexual Activity  . Alcohol use: Not Currently    Alcohol/week: 0.0 standard drinks  . Drug use: No  . Sexual activity: Not  on file  Lifestyle  . Physical activity    Days per week: 0 days    Minutes per session: 0 min  . Stress: Not on file  Relationships  . Social Herbalist on phone: Patient refused    Gets together: Patient refused    Attends religious service: Patient refused    Active member of club or organization: Patient refused    Attends meetings of clubs or organizations: Patient refused    Relationship status: Patient refused  . Intimate partner violence    Fear of current or ex partner: Patient refused    Emotionally abused: Patient refused    Physically abused: Patient refused    Forced sexual activity: Patient refused  Other Topics Concern  . Not on file  Social History Narrative  . Not on file    Review of Systems: See HPI, otherwise negative ROS  Physical Exam: BP 119/82   Pulse 71   Temp (!) 96.7 F (35.9 C) (Temporal)   Resp 18   Ht 5' (1.524 m)   Wt 37.6 kg   SpO2 100%   BMI 16.21 kg/m  General:   Alert,  pleasant and cooperative in NAD Head:  Normocephalic and atraumatic. Neck:  Supple; no masses or thyromegaly. Lungs:  Clear throughout to auscultation, normal respiratory effort.    Heart:  +S1, +S2, Regular rate and rhythm, No edema. Abdomen:  Soft, nontender and nondistended.  Normal bowel sounds, without guarding, and without rebound.   Neurologic:  Alert and  oriented x4;  grossly normal neurologically.  Impression/Plan: Jordan Ortega is here for an endoscopy  to be performed for  evaluation of abnormal ct scan of the abdomen     Risks, benefits, limitations, and alternatives regarding endoscopy have been reviewed with the patient.  Questions have been answered.  All parties agreeable.   Jordan Bellows, MD  12/05/2018, 8:16 AM

## 2018-12-05 NOTE — Anesthesia Post-op Follow-up Note (Signed)
Anesthesia QCDR form completed.        

## 2018-12-05 NOTE — Op Note (Signed)
[facility] Gastroenterology Patient Name: Jordan Ortega Procedure Date: 12/05/2018 8:17 AM MRN: [id number] Account #: [id number] Date of Birth: [birth year] Admit Type: Outpatient Age: 63 Room: [facility] ENDO ROOM 2 Gender: Female Note Status: Finalized Procedure:             Upper GI endoscopy Indications:           Abnormal CT of the GI tract Providers:             [name] MD, MD Medicines:             Monitored Anesthesia Care Complications:         No immediate complications. Procedure:             Pre-Anesthesia Assessment:                        - Prior to the procedure, a History and Physical was                         performed, and patient medications, allergies and                         sensitivities were reviewed. The patient's tolerance                         of previous anesthesia was reviewed.                        - The risks and benefits of the procedure and the                         sedation options and risks were discussed with the                         patient. All questions were answered and informed                         consent was obtained.                        - ASA Grade Assessment: II - A patient with mild                         systemic disease.                        After obtaining informed consent, the endoscope was                         passed under direct vision. Throughout the procedure,                         the patient's blood pressure, pulse, and oxygen                         saturations were monitored continuously. The Endoscope                         was introduced through the mouth, and advanced to the  third part of duodenum. The upper GI endoscopy was                         accomplished with ease. The patient tolerated the                         procedure well. Findings:      The examined esophagus was normal.      The examined duodenum was normal.      Diffuse mild inflammation  characterized by congestion (edema) and       erythema was found in the entire examined stomach. Biopsies were taken       with a cold forceps for histology.      The cardia and gastric fundus were normal on retroflexion. Impression:            - Normal esophagus.                        - Normal examined duodenum.                        - Gastritis. Biopsied. Recommendation:        - Discharge patient to home (with escort).                        - Resume previous diet.                        - Continue present medications.                        - Await pathology results.                        - Return to my office as previously scheduled. Procedure Code(s):     --- Professional ---                        [phone], Esophagogastroduodenoscopy, flexible,                         transoral; with biopsy, single or multiple Diagnosis Code(s):     --- Professional ---                        K29.70, Gastritis, unspecified, without bleeding                        R93.3, Abnormal findings on diagnostic imaging of                         other parts of digestive tract CPT copyright 2019 American Medical Association. All rights reserved. The codes documented in this report are preliminary and upon coder review may  be revised to meet current compliance requirements. [name], MD [name] MD, MD 12/05/2018 8:34:26 AM This report has been signed electronically. Number of Addenda: 0 Note Initiated On: 12/05/2018 8:17 AM Estimated Blood Loss:  Estimated blood loss: none.      [facility]

## 2018-12-05 NOTE — Anesthesia Procedure Notes (Signed)
Date/Time: 12/05/2018 8:25 AM Performed by: Johnna Acosta, CRNA Pre-anesthesia Checklist: Patient identified, Emergency Drugs available, Suction available, Patient being monitored and Timeout performed Patient Re-evaluated:Patient Re-evaluated prior to induction Oxygen Delivery Method: Nasal cannula Preoxygenation: Pre-oxygenation with 100% oxygen Induction Type: IV induction

## 2018-12-05 NOTE — Progress Notes (Signed)
Patient complained of nausea after sipping on cola. An order for Zofran was received from Dr. Rosey Bath and has been administered. No emesis. Will continue to monitor.

## 2018-12-05 NOTE — Anesthesia Preprocedure Evaluation (Signed)
Anesthesia Evaluation  Patient identified by MRN, date of birth, ID band Patient awake    Reviewed: Allergy & Precautions, NPO status , Patient's Chart, lab work & pertinent test results, reviewed documented beta blocker date and time   History of Anesthesia Complications (+) PROLONGED EMERGENCE and history of anesthetic complications  Airway Mallampati: I  TM Distance: <3 FB     Dental  (+) Caps, Missing, Dental Advidsory Given   Pulmonary neg shortness of breath, COPD,  COPD inhaler, neg recent URI, Current Smoker and Patient abstained from smoking.,    Pulmonary exam normal        Cardiovascular Exercise Tolerance: Good (-) hypertension(-) angina(-) Past MI and (-) Cardiac Stents Normal cardiovascular exam(-) dysrhythmias + Valvular Problems/Murmurs      Neuro/Psych  Headaches, neg Seizures PSYCHIATRIC DISORDERS Anxiety Depression Bipolar Disorder  Neuromuscular disease    GI/Hepatic Neg liver ROS, GERD  Medicated,Barrett's esophagus   Endo/Other  negative endocrine ROS  Renal/GU negative Renal ROS  negative genitourinary   Musculoskeletal  (+) Arthritis , Osteoarthritis,    Abdominal Normal abdominal exam  (+)   Peds negative pediatric ROS (+)  Hematology negative hematology ROS (+)   Anesthesia Other Findings Past Medical History: No date: Adnexal mass No date: Anxiety No date: Barrett's esophagus No date: Bell palsy     Comment:  left foot also ,  No date: Bronchitis No date: Complication of anesthesia     Comment:  difficulty waking up No date: Degenerative disc disease, cervical No date: Depression No date: Environmental and seasonal allergies No date: GERD (gastroesophageal reflux disease) No date: High triglycerides No date: Migraines No date: Migraines No date: Rapid heart rate  Reproductive/Obstetrics                             Anesthesia Physical  Anesthesia  Plan  ASA: II  Anesthesia Plan: General   Post-op Pain Management:    Induction: Intravenous  PONV Risk Score and Plan: 3 and Propofol infusion and TIVA  Airway Management Planned: Natural Airway and Nasal Cannula  Additional Equipment:   Intra-op Plan:   Post-operative Plan:   Informed Consent: I have reviewed the patients History and Physical, chart, labs and discussed the procedure including the risks, benefits and alternatives for the proposed anesthesia with the patient or authorized representative who has indicated his/her understanding and acceptance.     Dental advisory given  Plan Discussed with: CRNA and Surgeon  Anesthesia Plan Comments:         Anesthesia Quick Evaluation  

## 2018-12-05 NOTE — Transfer of Care (Signed)
Immediate Anesthesia Transfer of Care Note  [name]  Procedure(s) Performed: ESOPHAGOGASTRODUODENOSCOPY (EGD) WITH PROPOFOL (N/A )  Patient Location: PACU  Anesthesia Type:General  Level of Consciousness: sedated  Airway & Oxygen Therapy: Patient Spontanous Breathing  Post-op Assessment: Report given to RN and Post -op Vital signs reviewed and stable  Post vital signs: Reviewed and stable  Last Vitals:  Vitals Value Taken Time  BP 118/65 12/05/18 0837  Temp 36.2 C 12/05/18 0837  Pulse 64 12/05/18 0837  Resp 25 12/05/18 0837  SpO2 99 % 12/05/18 0837  Vitals shown include unvalidated device data.  Last Pain:  Vitals:   12/05/18 0837  TempSrc: Tympanic  PainSc:          Complications: No apparent anesthesia complications

## 2018-12-06 DIAGNOSIS — D239 Other benign neoplasm of skin, unspecified: Secondary | ICD-10-CM

## 2018-12-06 HISTORY — DX: Other benign neoplasm of skin, unspecified: D23.9

## 2018-12-06 LAB — SURGICAL PATHOLOGY

## 2018-12-06 NOTE — Anesthesia Postprocedure Evaluation (Signed)
Anesthesia Post Note  [name]  Procedure(s) Performed: ESOPHAGOGASTRODUODENOSCOPY (EGD) WITH PROPOFOL (N/A )  Patient location during evaluation: Endoscopy Anesthesia Type: General Level of consciousness: awake and alert Pain management: pain level controlled Vital Signs Assessment: post-procedure vital signs reviewed and stable Respiratory status: spontaneous breathing, nonlabored ventilation, respiratory function stable and patient connected to nasal cannula oxygen Cardiovascular status: blood pressure returned to baseline and stable Postop Assessment: no apparent nausea or vomiting Anesthetic complications: no     Last Vitals:  Vitals:   12/05/18 0755 12/05/18 0837  BP: 119/82 118/75  Pulse: 71 66  Resp: 18 (!) 22  Temp: (!) 35.9 C (!) 36.2 C  SpO2: 100% 99%    Last Pain:  Vitals:   12/05/18 0857  TempSrc:   PainSc: 0-No pain                 Martha Clan

## 2018-12-07 ENCOUNTER — Encounter: Payer: Self-pay | Admitting: Gastroenterology

## 2018-12-07 ENCOUNTER — Other Ambulatory Visit: Payer: Self-pay | Admitting: Oncology

## 2018-12-07 DIAGNOSIS — R634 Abnormal weight loss: Secondary | ICD-10-CM

## 2018-12-08 ENCOUNTER — Other Ambulatory Visit: Payer: Self-pay

## 2018-12-08 DIAGNOSIS — R634 Abnormal weight loss: Secondary | ICD-10-CM

## 2018-12-08 DIAGNOSIS — R933 Abnormal findings on diagnostic imaging of other parts of digestive tract: Secondary | ICD-10-CM

## 2018-12-12 ENCOUNTER — Encounter: Payer: Self-pay | Admitting: Gastroenterology

## 2018-12-14 NOTE — Progress Notes (Signed)
[name] Female    DOB: [birth year]   63 y.o.   MRN: [id number] Visit Date: 12/20/2018  Today's Provider: Mar Daring, PA-C   Chief Complaint  Patient presents with  . Weight Loss   Subjective:    I,Joseline E. Rosas,RMA am acting as a Education administrator for [employer], PA-C.  HPI  Patient here today to talk about her unintentional weight loss.She has been followed for this by GI and Oncology with multiple work ups that have all been negative. Patient reports she just does not have any appetite and does have some mild depression from losing weight.   Wt Readings from Last 3 Encounters:  12/15/18 90 lb (40.8 kg)  12/05/18 83 lb (37.6 kg)  11/30/18 87 lb 9.6 oz (39.7 kg)    Allergies  Allergen Reactions  . Diazepam Other (See Comments)    Other reaction(s): Hallucination Patient states "it makes me crazy" REACTION: Behavioral problems  . Ciprofloxacin Other (See Comments)    Other reaction(s): Unknown Loopy  . Codeine Hives and Itching    REACTION: Dizziness  . Metoprolol     Migraine/headaches   . Morphine Hives    Other reaction(s): Unknown  . Niacin And Related Other (See Comments)    flushing  . Topiramate Other (See Comments)  . Toradol [Ketorolac Tromethamine] Itching    Tolerates when taken with Benadryl  . Doxycycline Diarrhea  . Iodinated Diagnostic Agents Hives    Pt states she had a CT Head with contrast in the 90's and broke out in hives after the injection. SPM. Reports can have contrast if she takes benadryl before.     Current Outpatient Medications:  .  albuterol (PROVENTIL HFA;VENTOLIN HFA) 108 (90 Base) MCG/ACT inhaler, Inhale 2 puffs into the lungs every 6 (six) hours as needed for wheezing or shortness of breath., Disp: 1 Inhaler, Rfl: 2 .  ALPRAZolam (XANAX) 0.5 MG tablet, Take 1 tablet (0.5 mg total) by mouth 3 (three) times daily as needed for anxiety., Disp: 90 tablet, Rfl: 0 .  Ascorbic Acid (VITAMIN C) 1000  MG tablet, Take 1,000 mg by mouth daily., Disp: , Rfl:  .  baclofen (LIORESAL) 10 MG tablet, TAKE 1 TABLET (10 MG TOTAL) BY MOUTH 2 (TWO) TIMES DAILY, Disp: , Rfl: 2 .  budesonide-formoterol (SYMBICORT) 160-4.5 MCG/ACT inhaler, Inhale 1 puff into the lungs 2 (two) times daily., Disp: 1 Inhaler, Rfl: 0 .  DULoxetine (CYMBALTA) 20 MG capsule, Take by mouth., Disp: , Rfl:  .  Erenumab-aooe (AIMOVIG) 140 MG/ML SOAJ, Inject 140 mg into the skin every 28 (twenty-eight) days., Disp: , Rfl:  .  gabapentin (NEURONTIN) 100 MG capsule, Take 300 mg by mouth at bedtime. , Disp: , Rfl:  .  HYDROcodone-acetaminophen (NORCO/VICODIN) 5-325 MG tablet, Take 1 tablet by mouth every 6 (six) hours as needed. For pain, Disp: 30 tablet, Rfl: 0 .  linaclotide (LINZESS) 145 MCG CAPS capsule, Take 1 capsule (145 mcg total) by mouth daily before breakfast., Disp: 90 capsule, Rfl: 3 .  lipase/protease/amylase (CREON) 36000 UNITS CPEP capsule, Take 2 caps with each meal and 1 with a snack, Disp: 210 capsule, Rfl: 3 .  loratadine (CLARITIN) 10 MG tablet, Take 10 mg by mouth 2 (two) times daily., Disp: , Rfl:  .  Multiple Vitamin (MULTIVITAMIN) tablet, Take 1 tablet by mouth daily., Disp: , Rfl:  .  pantoprazole (PROTONIX) 40 MG tablet, TAKE 1 TABLET BY MOUTH ONCE DAILY, Disp:  90 tablet, Rfl: 1 .  Probiotic Product (PROBIOTIC-10) CHEW, Chew 2 tablets by mouth daily., Disp: , Rfl:  .  promethazine (PHENERGAN) 25 MG tablet, Take 1 tablet (25 mg total) by mouth every 8 (eight) hours as needed for nausea or vomiting., Disp: 30 tablet, Rfl: 0 .  QUEtiapine (SEROQUEL) 50 MG tablet, Take 1 tablet (50 mg total) by mouth at bedtime. (Patient taking differently: Take 50 mg by mouth at bedtime. Takes 2 25mg  tablets), Disp: 90 tablet, Rfl: 3 .  simvastatin (ZOCOR) 40 MG tablet, TAKE 1 TABLET BY MOUTH AT BEDTIME, Disp: 30 tablet, Rfl: 5 .  Specialty Vitamins Products (BIOTIN PLUS KERATIN) 10000-100 MCG-MG TABS, Take 1 tablet by mouth daily.,  Disp: , Rfl:  .  traZODone (DESYREL) 100 MG tablet, Take 100 mg by mouth at bedtime., Disp: , Rfl:  .  mirtazapine (REMERON) 15 MG tablet, Take 1 tablet (15 mg total) by mouth at bedtime., Disp: 30 tablet, Rfl: 0  Review of Systems  Constitutional: Positive for appetite change and unexpected weight change.  Respiratory: Negative.   Cardiovascular: Negative.   Gastrointestinal: Negative.   Neurological: Negative.     Social History   Tobacco Use  . Smoking status: Current Every Day Smoker    Packs/day: 0.50    Years: 30.00    Pack years: 15.00    Types: Cigarettes  . Smokeless tobacco: Never Used  Substance Use Topics  . Alcohol use: Not Currently    Alcohol/week: 0.0 standard drinks      Objective:   BP 114/70 (BP Location: Left Arm, Patient Position: Sitting, Cuff Size: Normal)   Pulse 66   Temp (!) 97.2 F (36.2 C) (Temporal)   Resp 16   Wt 90 lb (40.8 kg)   BMI 17.58 kg/m  Vitals:   12/15/18 1326  BP: 114/70  Pulse: 66  Resp: 16  Temp: (!) 97.2 F (36.2 C)  TempSrc: Temporal  Weight: 90 lb (40.8 kg)  Body mass index is 17.58 kg/m.   Physical Exam Vitals signs reviewed.  Constitutional:      General: She is not in acute distress.    Appearance: Normal appearance. She is well-developed. She is not ill-appearing or diaphoretic.  Neck:     Musculoskeletal: Normal range of motion and neck supple.  Cardiovascular:     Rate and Rhythm: Normal rate and regular rhythm.     Heart sounds: Normal heart sounds. No murmur. No friction rub. No gallop.   Pulmonary:     Effort: Pulmonary effort is normal. No respiratory distress.     Breath sounds: Normal breath sounds. No wheezing or rales.  Neurological:     Mental Status: She is alert.  Psychiatric:        Attention and Perception: Attention and perception normal.        Mood and Affect: Mood is depressed.        Speech: Speech normal.        Behavior: Behavior normal. Behavior is cooperative.        Thought  Content: Thought content normal.        Cognition and Memory: Cognition and memory normal.        Judgment: Judgment normal.     No results found for any visits on 12/15/18.     Assessment & Plan    1. Weight loss Since GI and Oncology work ups have been essentially negative we will start Remeron as below. I will see her back in  1-2 months for medication follow up and weight check. She is to call if she has any adverse reactions to the medication.  - mirtazapine (REMERON) 15 MG tablet; Take 1 tablet (15 mg total) by mouth at bedtime.  Dispense: 30 tablet; Refill: 0     Mar Daring, PA-C  [facility]

## 2018-12-15 ENCOUNTER — Encounter: Payer: Self-pay | Admitting: Physician Assistant

## 2018-12-15 ENCOUNTER — Ambulatory Visit: Payer: BC Managed Care – PPO | Admitting: Physician Assistant

## 2018-12-15 ENCOUNTER — Other Ambulatory Visit: Payer: Self-pay

## 2018-12-15 VITALS — BP 114/70 | HR 66 | Temp 97.2°F | Resp 16 | Wt 90.0 lb

## 2018-12-15 DIAGNOSIS — R634 Abnormal weight loss: Secondary | ICD-10-CM | POA: Diagnosis not present

## 2018-12-15 MED ORDER — MIRTAZAPINE 15 MG PO TABS: 15.0000 mg | ORAL_TABLET | Freq: Every day | ORAL | 0 refills | Status: DC

## 2018-12-15 MED ORDER — LINACLOTIDE 145 MCG PO CAPS: 145.0000 ug | ORAL_CAPSULE | Freq: Every day | ORAL | 3 refills | Status: DC

## 2018-12-15 NOTE — Patient Instructions (Signed)
Mirtazapine tablets What is this medicine? MIRTAZAPINE (mir TAZ a peen) is used to treat depression. This medicine may be used for other purposes; ask your health care provider or pharmacist if you have questions. COMMON BRAND NAME(S): Remeron What should I tell my health care provider before I take this medicine? They need to know if you have any of these conditions:  bipolar disorder  glaucoma  kidney disease  liver disease  suicidal thoughts  an unusual or allergic reaction to mirtazapine, other medicines, foods, dyes, or preservatives  pregnant or trying to get pregnant  breast-feeding How should I use this medicine? Take this medicine by mouth with a glass of water. Follow the directions on the prescription label. Take your medicine at regular intervals. Do not take your medicine more often than directed. Do not stop taking this medicine suddenly except upon the advice of your doctor. Stopping this medicine too quickly may cause serious side effects or your condition may worsen. A special MedGuide will be given to you by the pharmacist with each prescription and refill. Be sure to read this information carefully each time. Talk to your pediatrician regarding the use of this medicine in children. Special care may be needed. Overdosage: If you think you have taken too much of this medicine contact a poison control center or emergency room at once. NOTE: This medicine is only for you. Do not share this medicine with others. What if I miss a dose? If you miss a dose, take it as soon as you can. If it is almost time for your next dose, take only that dose. Do not take double or extra doses. What may interact with this medicine? Do not take this medicine with any of the following medications:  linezolid  MAOIs like Carbex, Eldepryl, Marplan, Nardil, and Parnate  methylene blue (injected into a vein) This medicine may also interact with the following  medications:  alcohol  antiviral medicines for HIV or AIDS  certain medicines that treat or prevent blood clots like warfarin  certain medicines for depression, anxiety, or psychotic disturbances  certain medicines for fungal infections like ketoconazole and itraconazole  certain medicines for migraine headache like almotriptan, eletriptan, frovatriptan, naratriptan, rizatriptan, sumatriptan, zolmitriptan  certain medicines for seizures like carbamazepine or phenytoin  certain medicines for sleep  cimetidine  erythromycin  fentanyl  lithium  medicines for blood pressure  nefazodone  rasagiline  rifampin  supplements like St. John's wort, kava kava, valerian  tramadol  tryptophan This list may not describe all possible interactions. Give your health care provider a list of all the medicines, herbs, non-prescription drugs, or dietary supplements you use. Also tell them if you smoke, drink alcohol, or use illegal drugs. Some items may interact with your medicine. What should I watch for while using this medicine? Tell your doctor if your symptoms do not get better or if they get worse. Visit your doctor or health care professional for regular checks on your progress. Because it may take several weeks to see the full effects of this medicine, it is important to continue your treatment as prescribed by your doctor. Patients and their families should watch out for new or worsening thoughts of suicide or depression. Also watch out for sudden changes in feelings such as feeling anxious, agitated, panicky, irritable, hostile, aggressive, impulsive, severely restless, overly excited and hyperactive, or not being able to sleep. If this happens, especially at the beginning of treatment or after a change in dose, call your health   care professional. You may get drowsy or dizzy. Do not drive, use machinery, or do anything that needs mental alertness until you know how this medicine  affects you. Do not stand or sit up quickly, especially if you are an older patient. This reduces the risk of dizzy or fainting spells. Alcohol may interfere with the effect of this medicine. Avoid alcoholic drinks. This medicine may cause dry eyes and blurred vision. If you wear contact lenses you may feel some discomfort. Lubricating drops may help. See your eye doctor if the problem does not go away or is severe. Your mouth may get dry. Chewing sugarless gum or sucking hard candy, and drinking plenty of water may help. Contact your doctor if the problem does not go away or is severe. What side effects may I notice from receiving this medicine? Side effects that you should report to your doctor or health care professional as soon as possible:  allergic reactions like skin rash, itching or hives, swelling of the face, lips, or tongue  anxious  changes in vision  chest pain  confusion  elevated mood, decreased need for sleep, racing thoughts, impulsive behavior  eye pain  fast, irregular heartbeat  feeling faint or lightheaded, falls  feeling agitated, angry, or irritable  fever or chills, sore throat  hallucination, loss of contact with reality  loss of balance or coordination  mouth sores  redness, blistering, peeling or loosening of the skin, including inside the mouth  restlessness, pacing, inability to keep still  seizures  stiff muscles  suicidal thoughts or other mood changes  trouble passing urine or change in the amount of urine  trouble sleeping  unusual bleeding or bruising  unusually weak or tired  vomiting Side effects that usually do not require medical attention (report to your doctor or health care professional if they continue or are bothersome):  change in appetite  constipation  dizziness  dry mouth  muscle aches or pains  nausea  tired  weight gain This list may not describe all possible side effects. Call your doctor for  medical advice about side effects. You may report side effects to FDA at 1-800-FDA-1088. Where should I keep my medicine? Keep out of the reach of children. Store at room temperature between 15 and 30 degrees C (59 and 86 degrees F) Protect from light and moisture. Throw away any unused medicine after the expiration date. NOTE: This sheet is a summary. It may not cover all possible information. If you have questions about this medicine, talk to your doctor, pharmacist, or health care provider.  2020 [employer]/Gold Standard (2015-06-19 17:30:45)  

## 2018-12-18 ENCOUNTER — Other Ambulatory Visit: Payer: BC Managed Care – PPO | Attending: Gastroenterology

## 2018-12-21 ENCOUNTER — Encounter
Admission: RE | Disposition: A | Discharge status: Home / Self Care | Payer: Self-pay | Source: Home / Self Care | Attending: Gastroenterology

## 2018-12-21 ENCOUNTER — Encounter: Payer: Self-pay | Admitting: Certified Registered"

## 2018-12-21 ENCOUNTER — Ambulatory Visit
Admission: RE | Admit: 2018-12-21 | Discharge: 2018-12-21 | Disposition: A | Discharge status: Home / Self Care | Payer: BC Managed Care – PPO | Attending: Gastroenterology | Admitting: Gastroenterology

## 2018-12-21 ENCOUNTER — Other Ambulatory Visit: Payer: Self-pay

## 2018-12-21 DIAGNOSIS — J449 Chronic obstructive pulmonary disease, unspecified: Secondary | ICD-10-CM | POA: Insufficient documentation

## 2018-12-21 DIAGNOSIS — Z79899 Other long term (current) drug therapy: Secondary | ICD-10-CM | POA: Insufficient documentation

## 2018-12-21 DIAGNOSIS — K219 Gastro-esophageal reflux disease without esophagitis: Secondary | ICD-10-CM | POA: Insufficient documentation

## 2018-12-21 DIAGNOSIS — Z881 Allergy status to other antibiotic agents status: Secondary | ICD-10-CM | POA: Insufficient documentation

## 2018-12-21 DIAGNOSIS — R933 Abnormal findings on diagnostic imaging of other parts of digestive tract: Secondary | ICD-10-CM | POA: Diagnosis not present

## 2018-12-21 DIAGNOSIS — R634 Abnormal weight loss: Secondary | ICD-10-CM

## 2018-12-21 DIAGNOSIS — F17210 Nicotine dependence, cigarettes, uncomplicated: Secondary | ICD-10-CM | POA: Diagnosis not present

## 2018-12-21 DIAGNOSIS — Z791 Long term (current) use of non-steroidal anti-inflammatories (NSAID): Secondary | ICD-10-CM | POA: Diagnosis not present

## 2018-12-21 DIAGNOSIS — Z888 Allergy status to other drugs, medicaments and biological substances status: Secondary | ICD-10-CM | POA: Insufficient documentation

## 2018-12-21 DIAGNOSIS — K2970 Gastritis, unspecified, without bleeding: Secondary | ICD-10-CM | POA: Insufficient documentation

## 2018-12-21 DIAGNOSIS — Z681 Body mass index (BMI) 19 or less, adult: Secondary | ICD-10-CM | POA: Insufficient documentation

## 2018-12-21 DIAGNOSIS — Z91041 Radiographic dye allergy status: Secondary | ICD-10-CM | POA: Diagnosis not present

## 2018-12-21 DIAGNOSIS — Z885 Allergy status to narcotic agent status: Secondary | ICD-10-CM | POA: Insufficient documentation

## 2018-12-21 HISTORY — PX: GIVENS CAPSULE STUDY: SHX5432

## 2018-12-21 SURGERY — IMAGING PROCEDURE, GI TRACT, INTRALUMINAL, VIA CAPSULE

## 2018-12-22 ENCOUNTER — Encounter: Payer: Self-pay | Admitting: Gastroenterology

## 2018-12-27 ENCOUNTER — Telehealth: Payer: Self-pay | Admitting: Gastroenterology

## 2018-12-27 NOTE — Telephone Encounter (Signed)
Patient called & l/m returning Jadijah's call for results.

## 2019-01-02 ENCOUNTER — Inpatient Hospital Stay: Payer: BC Managed Care – PPO

## 2019-01-02 ENCOUNTER — Encounter: Payer: Self-pay | Admitting: Oncology

## 2019-01-02 ENCOUNTER — Other Ambulatory Visit: Payer: Self-pay

## 2019-01-02 ENCOUNTER — Inpatient Hospital Stay: Payer: BC Managed Care – PPO | Attending: Oncology | Admitting: Oncology

## 2019-01-02 VITALS — BP 111/69 | HR 59 | Temp 98.1°F | Resp 16 | Wt 88.6 lb

## 2019-01-02 DIAGNOSIS — R634 Abnormal weight loss: Secondary | ICD-10-CM

## 2019-01-02 DIAGNOSIS — Z122 Encounter for screening for malignant neoplasm of respiratory organs: Secondary | ICD-10-CM | POA: Diagnosis not present

## 2019-01-02 DIAGNOSIS — K5909 Other constipation: Secondary | ICD-10-CM | POA: Insufficient documentation

## 2019-01-02 DIAGNOSIS — R110 Nausea: Secondary | ICD-10-CM | POA: Insufficient documentation

## 2019-01-02 DIAGNOSIS — J432 Centrilobular emphysema: Secondary | ICD-10-CM

## 2019-01-02 DIAGNOSIS — R918 Other nonspecific abnormal finding of lung field: Secondary | ICD-10-CM | POA: Diagnosis not present

## 2019-01-02 DIAGNOSIS — Z87891 Personal history of nicotine dependence: Secondary | ICD-10-CM

## 2019-01-02 DIAGNOSIS — F17210 Nicotine dependence, cigarettes, uncomplicated: Secondary | ICD-10-CM | POA: Diagnosis not present

## 2019-01-02 LAB — COMPREHENSIVE METABOLIC PANEL
ALT: 17 U/L (ref 0–44)
AST: 18 U/L (ref 15–41)
Albumin: 4.6 g/dL (ref 3.5–5.0)
Alkaline Phosphatase: 51 U/L (ref 38–126)
Anion gap: 8 (ref 5–15)
BUN: 17 mg/dL (ref 8–23)
CO2: 25 mmol/L (ref 22–32)
Calcium: 9.5 mg/dL (ref 8.9–10.3)
Chloride: 104 mmol/L (ref 98–111)
Creatinine, Ser: 0.78 mg/dL (ref 0.44–1.00)
GFR calc Af Amer: >60 mL/min (ref >60)
GFR calc non Af Amer: >60 mL/min (ref >60)
Glucose, Bld: 102 mg/dL — ABNORMAL HIGH (ref 70–99)
Potassium: 4.4 mmol/L (ref 3.5–5.1)
Sodium: 137 mmol/L (ref 135–145)
Total Bilirubin: 0.6 mg/dL (ref 0.3–1.2)
Total Protein: 7.4 g/dL (ref 6.5–8.1)

## 2019-01-02 LAB — CBC WITH DIFFERENTIAL/PLATELET
Abs Immature Granulocytes: 0.01 10*3/uL (ref 0.00–0.07)
Basophils Absolute: 0.1 10*3/uL (ref 0.0–0.1)
Basophils Relative: 1 %
Eosinophils Absolute: 0.1 10*3/uL (ref 0.0–0.5)
Eosinophils Relative: 2 %
HCT: 39 % (ref 36.0–46.0)
Hemoglobin: 13.3 g/dL (ref 12.0–15.0)
Immature Granulocytes: 0 %
Lymphocytes Relative: 43 %
Lymphs Abs: 2.5 10*3/uL (ref 0.7–4.0)
MCH: 33.9 pg (ref 26.0–34.0)
MCHC: 34.1 g/dL (ref 30.0–36.0)
MCV: 99.5 fL (ref 80.0–100.0)
Monocytes Absolute: 0.4 10*3/uL (ref 0.1–1.0)
Monocytes Relative: 7 %
Neutro Abs: 2.7 10*3/uL (ref 1.7–7.7)
Neutrophils Relative %: 47 %
Platelets: 224 10*3/uL (ref 150–400)
RBC: 3.92 MIL/uL (ref 3.87–5.11)
RDW: 12 % (ref 11.5–15.5)
WBC: 5.7 10*3/uL (ref 4.0–10.5)
nRBC: 0 % (ref 0.0–0.2)

## 2019-01-02 LAB — CORTISOL: Cortisol, Plasma: 3.9 ug/dL

## 2019-01-02 NOTE — Progress Notes (Signed)
Hematology/Oncology Consult note [facility] Telephone:([phone] [phone]   Patient Care Team: Rubye Beach as PCP - General (Family Medicine)  REFERRING PROVIDER: Mar Daring, P*  CHIEF COMPLAINTS/REASON FOR VISIT:  Evaluation of unintentional weight loss.  HISTORY OF PRESENTING ILLNESS:   Jordan Ortega is a  63 y.o.  female with PMH listed below was seen in consultation at the request of  Mar Daring, P*  for evaluation of unintentional weight loss Patient reports that she was in her usual state of health until January 2020 when she started to have unintentional weight loss. Her weight was 113 pound on 12/05/2017 when she was seen by cardiology Dr.: Christia Reading for arm pain  Weight was 104 pound on 03/13/2018 when she saw primary care provider With further decreased to 100 Ib on 05/30/2018 at PCPs office. 08/14/2018 97 Ib, at primary care physician's office.  She complained having diarrhea at that time.  She went to emergency room for evaluation of dehydration.  Their blood work showed C. difficile and a GI PCR were negative.  Normal CMP, lipase was normal.  Diarrhea was nonbloody. 08/28/2018 seen by gastroenterology Dr. Vicente Males and the weight was 95 Ib TSH was checked which was normal.  Patient was given trials of Cryan and a fiber supplementation. 09/12/2018, seen by Dr. Vicente Males again in the office.  Weighed 95ib.  Patient reports that crying has caused her to have constipation.  She has benefited from crying and stool softeners. GI felt the patient may have constipation with overflow diarrhea. EGD 07/2016- endoscopically gastritis seen. small bowel bx - normal , gastric bx -moderate gastropathy  Colonoscopy 07/2016 - hyperplastic polyp, random colon bx negative for colitis. Medium sized internal hemorroids seen.  Patient also felt that her appetite has further decreased since July 2020.  Also feels some nausea CT has been  ordered for further evaluation of her unintentional weight loss and was not approved by insurance.  Patient was sent to heme-onc for further evaluation. Patient also reports some abdominal discomfort, around umbilical area,  associated with eating.  No alleviating or exacerbating factors.  INTERVAL HISTORY Jordan Ortega is a 63 y.o. female who has above history reviewed by me today presents for follow up visit for management of unintentional weight loss. Problems and complaints are listed below: During the interval, patient has underwent extensive work up.  10/13/2018 CT scan of the abdomen demonstrated a 4.4 cm segment of mid transverse colon which exhibits circumferential wall thickening and has a maximum diameter of 2.3 cm. Some mention of prestenotic dilation is identified within the proximal transverse colon.  10/20/2018: Colonoscopy : normal .  Normal mammogram 10/2018  11/23/2018: CT scan of the abdomen without contrast: Wall thickening of the distal gastric antrum suspect a degree of antral gastritis.  Borderline distention of the transverse colon which could indicate mild colonic ileus.  Previous thickening of the transverse colon seen on prior CT is no longer evident.  Stool fluid throughout the colon.  Gallbladder absent.  2 mm nodular opacity in the anterior segment of the left lower lobe.  12/05/2018 - Normal esophagus. - Normal examined duodenum. - Gastritis. Biopsied. Pathology showed mild nonspecific chronic gastritis. Negative for H pylori, active inflammtion, metaplasia, dysplasia, malignancy.   She continues to have unintentional weight loss. Feels nausea after eating. She has to "force herself to eat".  She used phenegan which only helps a little.  Chronic constipation, on Linzess.   She is  recently started on Remeron 37m for appetite stimulant and she has not felt any difference.   Review of Systems  Constitutional: Positive for appetite change, fatigue and  unexpected weight change. Negative for chills and fever.  HENT:   Negative for hearing loss and voice change.   Eyes: Negative for eye problems.  Respiratory: Negative for chest tightness and cough.   Cardiovascular: Negative for chest pain.  Gastrointestinal: Positive for abdominal pain and nausea. Negative for abdominal distention and blood in stool.  Endocrine: Negative for hot flashes.  Genitourinary: Negative for difficulty urinating and frequency.   Musculoskeletal: Negative for arthralgias.  Skin: Negative for itching and rash.  Neurological: Negative for extremity weakness.  Hematological: Negative for adenopathy.  Psychiatric/Behavioral: Negative for confusion.    MEDICAL HISTORY:  Past Medical History:  Diagnosis Date   Adnexal mass    Anxiety    Barrett's esophagus    Bell palsy    left foot also ,    Bronchitis    Complication of anesthesia    difficulty waking up   COPD (chronic obstructive pulmonary disease) (HCC)    Degenerative disc disease, cervical    Depression    Environmental and seasonal allergies    GERD (gastroesophageal reflux disease)    Heart murmur    Hx   High triglycerides    Migraines    Migraines    Rapid heart rate     SURGICAL HISTORY: Past Surgical History:  Procedure Laterality Date   ABDOMINAL HYSTERECTOMY     APPENDECTOMY     CARDIAC CATHETERIZATION     [facility] pt doesn't have any stents or recall years ago 20+ yrs ago   CHOLECYSTECTOMY     COLONOSCOPY WITH PROPOFOL N/A 07/16/2016   Procedure: COLONOSCOPY WITH PROPOFOL;  Surgeon: AJonathon Bellows MD;  [facility]  Service: Endoscopy;  Laterality: N/A;   COLONOSCOPY WITH PROPOFOL N/A 10/20/2018   Procedure: COLONOSCOPY WITH PROPOFOL;  Surgeon: AJonathon Bellows MD;  [facility];  Service: Gastroenterology;  Laterality: N/A;   DIAGNOSTIC LAPAROSCOPY     ESOPHAGOGASTRODUODENOSCOPY (EGD) WITH PROPOFOL N/A 07/16/2016   Procedure:  ESOPHAGOGASTRODUODENOSCOPY (EGD) WITH PROPOFOL;  Surgeon: AJonathon Bellows MD;  [facility];  Service: Endoscopy;  Laterality: N/A;   ESOPHAGOGASTRODUODENOSCOPY (EGD) WITH PROPOFOL N/A 12/05/2018   Procedure: ESOPHAGOGASTRODUODENOSCOPY (EGD) WITH PROPOFOL;  Surgeon: AJonathon Bellows MD;  [facility];  Service: Gastroenterology;  Laterality: N/A;   GALLBLADDER SURGERY  1980   GIVENS CAPSULE STUDY N/A 12/21/2018   Procedure: GIVENS CAPSULE STUDY;  Surgeon: AJonathon Bellows MD;  [facility];  Service: Gastroenterology;  Laterality: N/A;   LAPAROSCOPIC SALPINGO OOPHERECTOMY Bilateral 08/27/2016   Procedure: LAPAROSCOPIC SALPINGO OOPHORECTOMY;  Surgeon: HGae Dry MD;  Location: [facility];  Service: Gynecology;  Laterality: Bilateral;   TUBAL LIGATION  1983    SOCIAL HISTORY: Social History   Socioeconomic History   Marital status: Married    Spouse name: Not on file   Number of children: Not on file   Years of education: Not on file   Highest education level: Not on file  Occupational History   Not on file  Social Needs   Financial resource strain: Not hard at all   Food insecurity    Worry: Never true    Inability: Never true   Transportation needs    Medical: No    Non-medical: No  Tobacco Use   Smoking status: Current Every Day Smoker    Packs/day: 0.50  Years: 30.00    Pack years: 15.00    Types: Cigarettes   Smokeless tobacco: Never Used  Substance and Sexual Activity   Alcohol use: Not Currently    Alcohol/week: 0.0 standard drinks   Drug use: No   Sexual activity: Not on file  Lifestyle   Physical activity    Days per week: 0 days    Minutes per session: 0 min   Stress: Not on file  Relationships   Social connections    Talks on phone: Patient refused    Gets together: Patient refused    Attends religious service: Patient refused    Active member of club or organization: Patient refused    Attends meetings  of clubs or organizations: Patient refused    Relationship status: Patient refused   Intimate partner violence    Fear of current or ex partner: Patient refused    Emotionally abused: Patient refused    Physically abused: Patient refused    Forced sexual activity: Patient refused  Other Topics Concern   Not on file  Social History Narrative   Not on file    FAMILY HISTORY: Family History  Problem Relation Age of Onset   Breast cancer Mother 15   Skin cancer Mother    Hypothyroidism Mother    Parkinson's disease Mother    Migraines Sister    Colon cancer Father    Lung cancer Father    Heart disease Father    Diabetes Paternal Grandmother    Hypertension Paternal Grandmother    Stroke Paternal Grandmother    Dementia Paternal Grandfather    Breast cancer Maternal Aunt        post men.   Diabetes Paternal Uncle    Breast cancer Cousin 4       maternal    Multiple myeloma Cousin     ALLERGIES:  is allergic to diazepam; ciprofloxacin; codeine; metoprolol; morphine; niacin and related; topiramate; toradol [ketorolac tromethamine]; doxycycline; and iodinated diagnostic agents.  MEDICATIONS:  Current Outpatient Medications  Medication Sig Dispense Refill   albuterol (PROVENTIL HFA;VENTOLIN HFA) 108 (90 Base) MCG/ACT inhaler Inhale 2 puffs into the lungs every 6 (six) hours as needed for wheezing or shortness of breath. 1 Inhaler 2   ALPRAZolam (XANAX) 0.5 MG tablet Take 1 tablet (0.5 mg total) by mouth 3 (three) times daily as needed for anxiety. 90 tablet 0   Ascorbic Acid (VITAMIN C) 1000 MG tablet Take 1,000 mg by mouth daily.     baclofen (LIORESAL) 10 MG tablet 3 (three) times daily.   2   budesonide-formoterol (SYMBICORT) 160-4.5 MCG/ACT inhaler Inhale 1 puff into the lungs 2 (two) times daily. 1 Inhaler 0   DULoxetine (CYMBALTA) 20 MG capsule Take by mouth.     Erenumab-aooe (AIMOVIG) 140 MG/ML SOAJ Inject 140 mg into the skin every 28  (twenty-eight) days.     gabapentin (NEURONTIN) 100 MG capsule Take 300 mg by mouth at bedtime.      HYDROcodone-acetaminophen (NORCO/VICODIN) 5-325 MG tablet Take 1 tablet by mouth every 6 (six) hours as needed. For pain 30 tablet 0   linaclotide (LINZESS) 145 MCG CAPS capsule Take 1 capsule (145 mcg total) by mouth daily before breakfast. 90 capsule 3   lipase/protease/amylase (CREON) 36000 UNITS CPEP capsule Take 2 caps with each meal and 1 with a snack 210 capsule 3   loratadine (CLARITIN) 10 MG tablet Take 10 mg by mouth 2 (two) times daily.     mirtazapine (REMERON) 15  MG tablet Take 1 tablet (15 mg total) by mouth at bedtime. 30 tablet 0   Multiple Vitamin (MULTIVITAMIN) tablet Take 1 tablet by mouth daily.     pantoprazole (PROTONIX) 40 MG tablet TAKE 1 TABLET BY MOUTH ONCE DAILY 90 tablet 1   Probiotic Product (PROBIOTIC-10) CHEW Chew 2 tablets by mouth daily.     promethazine (PHENERGAN) 25 MG tablet Take 1 tablet (25 mg total) by mouth every 8 (eight) hours as needed for nausea or vomiting. 30 tablet 0   QUEtiapine (SEROQUEL) 50 MG tablet Take 1 tablet (50 mg total) by mouth at bedtime. (Patient taking differently: Take 50 mg by mouth at bedtime. Takes 2 43m tablets) 90 tablet 3   simvastatin (ZOCOR) 40 MG tablet TAKE 1 TABLET BY MOUTH AT BEDTIME 30 tablet 5   Specialty Vitamins Products (BIOTIN PLUS KERATIN) 10000-100 MCG-MG TABS Take 1 tablet by mouth daily.     traZODone (DESYREL) 100 MG tablet Take 100 mg by mouth at bedtime.     No current facility-administered medications for this visit.      PHYSICAL EXAMINATION: ECOG PERFORMANCE STATUS: 1 - Symptomatic but completely ambulatory Vitals:   01/02/19 1010  BP: 111/69  Pulse: (!) 59  Resp: 16  Temp: 98.1 F (36.7 C)   Filed Weights   01/02/19 1010  Weight: 88 lb 9.6 oz (40.2 kg)    Physical Exam Constitutional:      General: She is not in acute distress.    Comments: thin  HENT:     Head:  Normocephalic and atraumatic.  Eyes:     General: No scleral icterus.    Pupils: Pupils are equal, round, and reactive to light.  Neck:     Musculoskeletal: Normal range of motion and neck supple.  Cardiovascular:     Rate and Rhythm: Normal rate and regular rhythm.     Heart sounds: Normal heart sounds.  Pulmonary:     Effort: Pulmonary effort is normal. No respiratory distress.     Breath sounds: No wheezing.     Comments: Severely diminished breath sounds bilaterally. Abdominal:     General: Bowel sounds are normal. There is no distension.     Palpations: Abdomen is soft. There is no mass.     Tenderness: There is no abdominal tenderness.  Musculoskeletal: Normal range of motion.        General: No deformity.  Skin:    General: Skin is warm and dry.     Findings: No erythema or rash.  Neurological:     Mental Status: She is alert and oriented to person, place, and time.     Cranial Nerves: No cranial nerve deficit.     Coordination: Coordination normal.  Psychiatric:        Behavior: Behavior normal.        Thought Content: Thought content normal.     LABORATORY DATA:  I have reviewed the data as listed Lab Results  Component Value Date   WBC 5.7 01/02/2019   HGB 13.3 01/02/2019   HCT 39.0 01/02/2019   MCV 99.5 01/02/2019   PLT 224 01/02/2019   Recent Labs    08/14/18 1604 08/23/18 1638 01/02/19 1129  NA 142 141 137  K 3.8 3.7 4.4  CL 101 106 104  CO2 '24 26 25  ' GLUCOSE 86 97 102*  BUN '15 14 17  ' CREATININE 0.94 0.82 0.78  CALCIUM 9.4 9.0 9.5  GFRNONAA 66 >60 >60  GFRAA 76 >60 >60  PROT 6.4 6.5 7.4  ALBUMIN 4.5 4.1 4.6  AST '16 18 18  ' ALT '9 13 17  ' ALKPHOS 61 56 51  BILITOT <0.2 0.4 0.6   Iron/TIBC/Ferritin/ %Sat No results found for: IRON, TIBC, FERRITIN, IRONPCTSAT    RADIOGRAPHIC STUDIES: I have personally reviewed the radiological images as listed and agreed with the findings in the report. Ct Abdomen Pelvis Wo Contrast  Result Date:  11/23/2018 CLINICAL DATA:  Abdominal pain and weight loss EXAM: CT ABDOMEN AND PELVIS WITHOUT CONTRAST TECHNIQUE: Multidetector CT imaging of the abdomen and pelvis was performed following the standard protocol without IV contrast. Oral contrast was administered. COMPARISON:  October 13, 2018. FINDINGS: Lower chest: No edema or consolidation is evident in the lung base regions. There is a stable 2 mm nodular opacity in the anterior segment of the left lower lobe. Hepatobiliary: No focal liver lesions are demonstrable on this noncontrast enhanced study. Gallbladder is absent. There is no appreciable biliary duct dilatation. Pancreas: No pancreatic mass or inflammatory focus. Spleen: No splenic lesions are evident. Adrenals/Urinary Tract: Adrenals bilaterally appear normal. Kidneys bilaterally show no evident mass or hydronephrosis on either side. There is no evident renal or ureteral calculus on either side. Urinary bladder is midline with wall thickness within normal limits. Stomach/Bowel: There is stool in fluid throughout the colon. There is borderline distension in the transverse colon which may indicate mild colonic ileus. The area of narrowing in the transverse colon seen on prior CT is no longer evident. There is currently wall thickening in the distal gastric antrum. No small bowel wall thickening noted. No evident bowel obstruction. Terminal ileum appears unremarkable. There is no demonstrable free air or portal venous air. Vascular/Lymphatic: There is no abdominal aortic aneurysm. There is aortic atherosclerosis. There is no appreciable adenopathy in the abdomen or pelvis. Reproductive: Uterus is absent. There is no evident pelvic mass. Other: Appendix is absent. There is no periappendiceal region inflammation. No abscess or ascites evident in the abdomen or pelvis. Musculoskeletal: No blastic or lytic bone lesions are evident. No intramuscular or abdominal wall lesions appreciable. IMPRESSION: 1. There  is currently wall thickening in the distal gastric antrum. Suspect a degree of antral gastritis. 2. There is borderline distension in the transverse colon which may indicate mild colonic ileus. The area of previous thickening in the transverse colon seen on prior CT is no longer evident. 3. There is stool in fluid throughout the colon. 4. Aortic atherosclerosis. 5. Stable 2 mm nodular opacity in the anterior segment of the left lower lobe. 6. Gallbladder absent. Aortic Atherosclerosis (ICD10-I70.0). Electronically Signed   By: Lowella Grip III M.D.   On: 11/23/2018 11:31   Ct Abdomen Pelvis Wo Contrast  Result Date: 10/13/2018 CLINICAL DATA:  Abdominal pain and loss of weight. EXAM: CT CHEST, ABDOMEN AND PELVIS WITHOUT CONTRAST TECHNIQUE: Multidetector CT imaging of the chest, abdomen and pelvis was performed following the standard protocol without IV contrast. COMPARISON:  CT AP 08/09/2016. FINDINGS: CT CHEST FINDINGS Cardiovascular: No significant vascular findings. Normal heart size. No pericardial effusion. Mediastinum/Nodes: No enlarged mediastinal, hilar, or axillary lymph nodes. Thyroid gland, trachea, and esophagus demonstrate no significant findings. Lungs/Pleura: Small peripheral nodule in the lateral left upper lobe is nonspecific measuring 4 mm, image 69/3. Disc shaped nodule along the oblique fissure of the left lung is noted and likely represents an intrapulmonary lymph node. 2 mm lateral left lower lobe lung nodule is identified, image 110/3. No additional suspicious nodules or masses identified. Musculoskeletal:  No chest wall mass or suspicious bone lesions identified. CT ABDOMEN PELVIS FINDINGS Hepatobiliary: No focal liver abnormality is seen. Status post cholecystectomy. No biliary dilatation. Pancreas: Unremarkable. No pancreatic ductal dilatation or surrounding inflammatory changes. Spleen: Normal in size without focal abnormality. Adrenals/Urinary Tract: Normal adrenal glands. No  kidney stone or mass identified. No hydronephrosis. Urinary bladder is unremarkable. Stomach/Bowel: Stomach is within normal limits. No small bowel wall thickening, inflammation or distension. 4.4 cm segment of mid transverse colon with circumferential wall thickening is identified measuring 2.3 cm in diameter, image 55/4. No surrounding inflammatory changes identified. Mild pre stenotic dilatation identified. No obstruction. Vascular/Lymphatic: Aortic atherosclerosis. No aneurysm. No abdominal no pelvic adenopathy identified. Reproductive: Status post hysterectomy. No adnexal masses. Other: No free fluid or fluid collections. Musculoskeletal: No acute or significant osseous findings. IMPRESSION: 1. There is a 4.4 cm segment of mid transverse colon which exhibits circumferential wall thickening and has a maximum diameter of 2.3 cm. Pre stenotic dilatation is identified within the more proximal transverse colon. Primary differential considerations include colonic neoplasm versus postinflammatory stricture. Correlation with colonoscopy advised. 2. Several small nonspecific pulmonary nodules are identified measuring less than 5 mm. No follow-up needed if patient is low-risk (and has no known or suspected primary neoplasm). Non-contrast chest CT can be considered in 12 months if patient is high-risk. This recommendation follows the consensus statement: Guidelines for Management of Incidental Pulmonary Nodules Detected on CT Images: From the Fleischner Society 2017; Radiology 2017; 284:[id number]. 3.  Aortic Atherosclerosis (ICD10-I70.0). Electronically Signed   By: Kerby Moors M.D.   On: 10/13/2018 14:23   Ct Chest Wo Contrast  Result Date: 10/13/2018 CLINICAL DATA:  Abdominal pain and loss of weight. EXAM: CT CHEST, ABDOMEN AND PELVIS WITHOUT CONTRAST TECHNIQUE: Multidetector CT imaging of the chest, abdomen and pelvis was performed following the standard protocol without IV contrast. COMPARISON:  CT AP 08/09/2016.  FINDINGS: CT CHEST FINDINGS Cardiovascular: No significant vascular findings. Normal heart size. No pericardial effusion. Mediastinum/Nodes: No enlarged mediastinal, hilar, or axillary lymph nodes. Thyroid gland, trachea, and esophagus demonstrate no significant findings. Lungs/Pleura: Small peripheral nodule in the lateral left upper lobe is nonspecific measuring 4 mm, image 69/3. Disc shaped nodule along the oblique fissure of the left lung is noted and likely represents an intrapulmonary lymph node. 2 mm lateral left lower lobe lung nodule is identified, image 110/3. No additional suspicious nodules or masses identified. Musculoskeletal: No chest wall mass or suspicious bone lesions identified. CT ABDOMEN PELVIS FINDINGS Hepatobiliary: No focal liver abnormality is seen. Status post cholecystectomy. No biliary dilatation. Pancreas: Unremarkable. No pancreatic ductal dilatation or surrounding inflammatory changes. Spleen: Normal in size without focal abnormality. Adrenals/Urinary Tract: Normal adrenal glands. No kidney stone or mass identified. No hydronephrosis. Urinary bladder is unremarkable. Stomach/Bowel: Stomach is within normal limits. No small bowel wall thickening, inflammation or distension. 4.4 cm segment of mid transverse colon with circumferential wall thickening is identified measuring 2.3 cm in diameter, image 55/4. No surrounding inflammatory changes identified. Mild pre stenotic dilatation identified. No obstruction. Vascular/Lymphatic: Aortic atherosclerosis. No aneurysm. No abdominal no pelvic adenopathy identified. Reproductive: Status post hysterectomy. No adnexal masses. Other: No free fluid or fluid collections. Musculoskeletal: No acute or significant osseous findings. IMPRESSION: 1. There is a 4.4 cm segment of mid transverse colon which exhibits circumferential wall thickening and has a maximum diameter of 2.3 cm. Pre stenotic dilatation is identified within the more proximal transverse  colon. Primary differential considerations include colonic neoplasm versus postinflammatory stricture. Correlation  with colonoscopy advised. 2. Several small nonspecific pulmonary nodules are identified measuring less than 5 mm. No follow-up needed if patient is low-risk (and has no known or suspected primary neoplasm). Non-contrast chest CT can be considered in 12 months if patient is high-risk. This recommendation follows the consensus statement: Guidelines for Management of Incidental Pulmonary Nodules Detected on CT Images: From the Fleischner Society 2017; Radiology 2017; [id number]. 3.  Aortic Atherosclerosis (ICD10-I70.0). Electronically Signed   By: Kerby Moors M.D.   On: 10/13/2018 14:23   Mm 3d Screen Breast Bilateral  Result Date: 10/26/2018 CLINICAL DATA:  Screening. EXAM: DIGITAL SCREENING BILATERAL MAMMOGRAM WITH TOMO AND CAD COMPARISON:  Previous exam(s). ACR Breast Density Category c: The breast tissue is heterogeneously dense, which may obscure small masses. FINDINGS: There are no findings suspicious for malignancy. Images were processed with CAD. IMPRESSION: No mammographic evidence of malignancy. A result letter of this screening mammogram will be mailed directly to the patient. RECOMMENDATION: Screening mammogram in one year. (Code:[id number]-B-01Y) BI-RADS CATEGORY  1: Negative. Electronically Signed   By: Everlean Alstrom M.D.   On: 10/26/2018 16:44      ASSESSMENT & PLAN:  1. Encounter for screening for malignant neoplasm of respiratory organs   2. Centrilobular emphysema ([place])   3. Nausea without vomiting   4. Weight loss, unintentional   5. Personal history of tobacco use, presenting hazards to health    #Significant unintentional weight loss, She has had extensive laboratory work up.  CBC is nonremarkable.  Smear is not remarkable. Multiple myeloma panel showed negative for monoclonal protein..  Kappa light chain ratio is normal. Normal LDH. Also s/p extensive GI work up no  explainable etiology for weight loss.  She had capsule endoscopy done, results are pending.   I will check her cortisol level.   # Nausea, not relieved by antiemetics.  History of chronic migraine. Check brain MRI, rule out CNS etiology.   #Several small nonspecific pulmonary nodules are identified measuring less than 5 mm. Tobacco user, centrilobular emphysema.  1 pack a day  since age of 34.  Patient is at risk of developing lung cancer.  We will continue to follow-up- annual lung cancer screening.  Smoke cessation was discussed in details with patient.  She is not interested.  Discussed with patient that if above work up is normal, recommend patient to discuss with PCP for referring to tertiary center for second opinions.    Orders Placed This Encounter  Procedures   MR Brain W Wo Contrast    Standing Status:   Future    Standing Expiration Date:   01/02/2020    Order Specific Question:   ** REASON FOR EXAM (FREE TEXT)    Answer:   persistent nausea    Order Specific Question:   If indicated for the ordered procedure, I authorize the administration of contrast media per Radiology protocol    Answer:   Yes    Order Specific Question:   What is the patient's sedation requirement?    Answer:   No Sedation    Order Specific Question:   Does the patient have a pacemaker or implanted devices?    Answer:   No    Order Specific Question:   Use SRS Protocol?    Answer:   Yes    Order Specific Question:   Radiology Contrast Protocol - do NOT remove file path    Answer:   [email or web address]    Order Specific Question:   Preferred imaging  location?    Answer:   [facility] (table limit-400lbs)   CBC with Differential    Standing Status:   Future    Number of Occurrences:   1    Standing Expiration Date:   01/02/2020   Comprehensive metabolic panel    Standing Status:   Future    Number of Occurrences:   1    Standing Expiration Date:   01/02/2020    Protein Electro, Random Urine    Standing Status:   Future    Number of Occurrences:   1    Standing Expiration Date:   01/02/2020    All questions were answered. The patient knows to call the clinic with any problems questions or concerns.  cc Mar Daring, P*   Earlie Server, MD, PhD Hematology Oncology [facility] at [facility] Pager- [phone] 01/02/2019

## 2019-01-02 NOTE — Telephone Encounter (Signed)
Spoke with pt and informed her that Dr. Vicente Males has not yet reviewed her Capsule Study results. I explained that we'll contact her with the results and Dr. Georgeann Oppenheim recommendations once received. Pt understands.

## 2019-01-02 NOTE — Telephone Encounter (Signed)
Will be reading on Thursday or Friday

## 2019-01-02 NOTE — Telephone Encounter (Signed)
Spoke with pt and informed her that we have not received the Capsule study results yet but I will call [facility] to find out if they can fax the results if available.

## 2019-01-03 LAB — PROTEIN ELECTRO, RANDOM URINE
Albumin ELP, Urine: 100 %
Alpha-1-Globulin, U: 0 %
Alpha-2-Globulin, U: 0 %
Beta Globulin, U: 0 %
Gamma Globulin, U: 0 %
Total Protein, Urine: 6 mg/dL

## 2019-01-04 NOTE — Telephone Encounter (Signed)
Pt has been notified of capsule study results and Dr. Georgeann Oppenheim recommendations for pt's pcp to refer pt to [facility] or [facility] for further evaluation if unintentional weight loss continues. Pt understands and plans to discuss with her pcp during visit next week.

## 2019-01-10 NOTE — Progress Notes (Signed)
[name] Female    DOB: 02-17-1955   63 y.o.   MRN: [id number] Visit Date: 01/12/2019  Today's Provider: Mar Daring, PA-C   Chief Complaint  Patient presents with   Follow-up    Weight Loss   Subjective:     HPI   Weight Loss, Follow up:  The patient was last seen for this 4 weeks ago. Changes made since that visit include start Remeron 15mg .  She reports excellent compliance with treatment. She is not having side effects.  She reports that she take it at 10 pm and it works better. She reports that twice last week around 11 pm she was feeling weak and flush, sweaty. She also reports having this episode once the week before. She is not sure if is coming from the medicine. During these episodes she was so weak she had to get help from her husband to get up the stairs.   Wt Readings from Last 3 Encounters:  01/12/19 88 lb (39.9 kg)  01/02/19 88 lb 9.6 oz (40.2 kg)  12/15/18 90 lb (40.8 kg)   ------------------------------------------------------------------------    Allergies  Allergen Reactions   Diazepam Other (See Comments)    Other reaction(s): Hallucination Patient states "it makes me crazy" REACTION: Behavioral problems   Ciprofloxacin Other (See Comments)    Other reaction(s): Unknown Loopy   Codeine Hives and Itching    REACTION: Dizziness   Metoprolol     Migraine/headaches    Morphine Hives    Other reaction(s): Unknown   Niacin And Related Other (See Comments)    flushing   Topiramate Other (See Comments)   Toradol [Ketorolac Tromethamine] Itching    Tolerates when taken with Benadryl   Doxycycline Diarrhea   Iodinated Diagnostic Agents Hives    Pt states she had a CT Head with contrast in the 90's and broke out in hives after the injection. SPM. Reports can have contrast if she takes benadryl before.     Current Outpatient Medications:    albuterol (PROVENTIL HFA;VENTOLIN HFA) 108 (90 Base) MCG/ACT  inhaler, Inhale 2 puffs into the lungs every 6 (six) hours as needed for wheezing or shortness of breath., Disp: 1 Inhaler, Rfl: 2   ALPRAZolam (XANAX) 0.5 MG tablet, Take 1 tablet (0.5 mg total) by mouth 3 (three) times daily as needed for anxiety., Disp: 90 tablet, Rfl: 0   Ascorbic Acid (VITAMIN C) 1000 MG tablet, Take 1,000 mg by mouth daily., Disp: , Rfl:    baclofen (LIORESAL) 10 MG tablet, 3 (three) times daily. , Disp: , Rfl: 2   budesonide-formoterol (SYMBICORT) 160-4.5 MCG/ACT inhaler, Inhale 1 puff into the lungs 2 (two) times daily., Disp: 1 Inhaler, Rfl: 0   DULoxetine (CYMBALTA) 20 MG capsule, Take by mouth., Disp: , Rfl:    Erenumab-aooe (AIMOVIG) 140 MG/ML SOAJ, Inject 140 mg into the skin every 28 (twenty-eight) days., Disp: , Rfl:    gabapentin (NEURONTIN) 100 MG capsule, Take 300 mg by mouth at bedtime. , Disp: , Rfl:    HYDROcodone-acetaminophen (NORCO/VICODIN) 5-325 MG tablet, Take 1 tablet by mouth every 6 (six) hours as needed. For pain, Disp: 30 tablet, Rfl: 0   linaclotide (LINZESS) 145 MCG CAPS capsule, Take 1 capsule (145 mcg total) by mouth daily before breakfast., Disp: 90 capsule, Rfl: 3   lipase/protease/amylase (CREON) 36000 UNITS CPEP capsule, Take 2 caps with each meal and 1 with a snack, Disp: 210 capsule, Rfl: 3  loratadine (CLARITIN) 10 MG tablet, Take 10 mg by mouth 2 (two) times daily., Disp: , Rfl:    mirtazapine (REMERON) 15 MG tablet, Take 1 tablet (15 mg total) by mouth at bedtime., Disp: 30 tablet, Rfl: 0   Multiple Vitamin (MULTIVITAMIN) tablet, Take 1 tablet by mouth daily., Disp: , Rfl:    pantoprazole (PROTONIX) 40 MG tablet, TAKE 1 TABLET BY MOUTH ONCE DAILY, Disp: 90 tablet, Rfl: 1   Probiotic Product (PROBIOTIC-10) CHEW, Chew 2 tablets by mouth daily., Disp: , Rfl:    promethazine (PHENERGAN) 25 MG tablet, Take 1 tablet (25 mg total) by mouth every 8 (eight) hours as needed for nausea or vomiting., Disp: 30 tablet, Rfl: 0    QUEtiapine (SEROQUEL) 50 MG tablet, Take 1 tablet (50 mg total) by mouth at bedtime. (Patient taking differently: Take 50 mg by mouth at bedtime. Takes 2 25mg  tablets), Disp: 90 tablet, Rfl: 3   simvastatin (ZOCOR) 40 MG tablet, TAKE 1 TABLET BY MOUTH AT BEDTIME, Disp: 30 tablet, Rfl: 5   Specialty Vitamins Products (BIOTIN PLUS KERATIN) 10000-100 MCG-MG TABS, Take 1 tablet by mouth daily., Disp: , Rfl:    traZODone (DESYREL) 100 MG tablet, Take 100 mg by mouth at bedtime., Disp: , Rfl:   Review of Systems  Constitutional: Positive for appetite change and unexpected weight change (stable over last 2 weeks). Negative for chills, fatigue and fever.  Respiratory: Negative for chest tightness and shortness of breath.   Cardiovascular: Negative for chest pain and palpitations.  Gastrointestinal: Positive for abdominal pain (with eating only) and nausea (with eating only). Negative for vomiting.  Neurological: Negative for dizziness, weakness and headaches.    Social History   Tobacco Use   Smoking status: Current Every Day Smoker    Packs/day: 0.50    Years: 30.00    Pack years: 15.00    Types: Cigarettes   Smokeless tobacco: Never Used  Substance Use Topics   Alcohol use: Not Currently    Alcohol/week: 0.0 standard drinks      Objective:   BP 95/60 (BP Location: Left Arm, Patient Position: Sitting, Cuff Size: Normal)    Pulse 73    Temp (!) 97.5 F (36.4 C) (Temporal)    Resp 16    Ht 5\' 1"  (1.549 m)    Wt 88 lb (39.9 kg)    BMI 16.63 kg/m  Vitals:   01/12/19 0809  BP: 95/60  Pulse: 73  Resp: 16  Temp: (!) 97.5 F (36.4 C)  TempSrc: Temporal  Weight: 88 lb (39.9 kg)  Height: 5\' 1"  (1.549 m)  Body mass index is 16.63 kg/m.   Physical Exam Vitals reviewed.  Constitutional:      General: She is not in acute distress.    Appearance: Normal appearance. She is well-developed and underweight. She is not ill-appearing.  HENT:     Head: Normocephalic and atraumatic.    Pulmonary:     Effort: Pulmonary effort is normal. No respiratory distress.  Musculoskeletal:     Cervical back: Normal range of motion and neck supple.  Neurological:     Mental Status: She is alert.  Psychiatric:        Mood and Affect: Mood normal.        Behavior: Behavior normal.        Thought Content: Thought content normal.        Judgment: Judgment normal.      No results found for any visits on 01/12/19.  Assessment & Plan    1. Generalized abdominal pain Has had very thorough evaluation by GI and [facility] with no apparent cause found thus far. She mentions she forces herself to eat but cannot eat a lot due to developing upper abdominal pain and severe nausea with eating. Patient is a smoker. The only other thing I can think of to r/o and symptoms are c/w is mesenteric ischemia. CTA abd ordered as below. I will f/u pending results. She declines the recommendation of referral to tertiary center stating "I am just tired." Also since she is tolerating Mirtazapine fairly well, she would like to try to increase to 30mg . Will give 2 tabs of 15mg  so if she has worsening side effects she can go back to the 15mg . She agrees. I will see her back in 4 weeks.  - CT ANGIO ABDOMEN PELVIS  W &/OR WO CONTRAST; Future  2. Nausea See above medical treatment plan. - CT ANGIO ABDOMEN PELVIS  W &/OR WO CONTRAST; Future  3. Weight loss See above medical treatment plan. - CT ANGIO ABDOMEN PELVIS  W &/OR WO CONTRAST; Future - mirtazapine (REMERON) 15 MG tablet; Take 2 tablets (30 mg total) by mouth at bedtime.  Dispense: 60 tablet; Refill: 0  4. Hypercholesterolemia Stable. Diagnosis pulled for medication refill. Continue current medical treatment plan. - simvastatin (ZOCOR) 20 MG tablet; Take 1 tablet (20 mg total) by mouth at bedtime.  Dispense: 90 tablet; Refill: Tselakai Dezza, PA-C  [facility]

## 2019-01-11 ENCOUNTER — Other Ambulatory Visit: Payer: Self-pay

## 2019-01-11 ENCOUNTER — Ambulatory Visit
Admission: RE | Admit: 2019-01-11 | Discharge: 2019-01-11 | Disposition: A | Discharge status: Home / Self Care | Payer: BC Managed Care – PPO | Source: Ambulatory Visit | Attending: Oncology | Admitting: Oncology

## 2019-01-11 DIAGNOSIS — R110 Nausea: Secondary | ICD-10-CM | POA: Diagnosis present

## 2019-01-11 MED ORDER — GADOBUTROL 1 MMOL/ML IV SOLN
4.0000 mL | Freq: Once | INTRAVENOUS | Status: AC | PRN
  Administered 2019-01-11: 4 mL via INTRAVENOUS

## 2019-01-12 ENCOUNTER — Ambulatory Visit (INDEPENDENT_AMBULATORY_CARE_PROVIDER_SITE_OTHER): Payer: BC Managed Care – PPO | Admitting: Physician Assistant

## 2019-01-12 ENCOUNTER — Encounter: Payer: Self-pay | Admitting: Physician Assistant

## 2019-01-12 VITALS — BP 95/60 | HR 73 | Temp 97.5°F | Resp 16 | Ht 61.0 in | Wt 88.0 lb

## 2019-01-12 DIAGNOSIS — E7800 Pure hypercholesterolemia, unspecified: Secondary | ICD-10-CM | POA: Diagnosis not present

## 2019-01-12 DIAGNOSIS — R634 Abnormal weight loss: Secondary | ICD-10-CM

## 2019-01-12 DIAGNOSIS — R1084 Generalized abdominal pain: Secondary | ICD-10-CM | POA: Diagnosis not present

## 2019-01-12 DIAGNOSIS — R110 Nausea: Secondary | ICD-10-CM | POA: Diagnosis not present

## 2019-01-12 MED ORDER — MIRTAZAPINE 15 MG PO TABS: 30.0000 mg | ORAL_TABLET | Freq: Every day | ORAL | 0 refills | Status: DC

## 2019-01-12 MED ORDER — SIMVASTATIN 20 MG PO TABS: 20.0000 mg | ORAL_TABLET | Freq: Every day | ORAL | 3 refills | Status: DC

## 2019-01-12 NOTE — Patient Instructions (Signed)
Chronic Mesenteric Ischemia  Chronic mesenteric ischemia is poor blood flow (circulation) in the vessels that supply blood to the stomach, intestines, and liver (mesenteric organs). When the blood supply is severely restricted, these organs cannot work properly. This condition is also called mesenteric angina, or intestinal angina. This condition is a long-term (chronic) condition. It happens when an artery or vein that provides blood to the mesenteric organs gradually becomes blocked or narrows over time, restricting the blood supply to these organs. What are the causes? This condition is commonly caused by fatty deposits that build up in an artery (plaque), which can narrow the artery and restrict blood flow. Other causes include:  Weakened areas in blood vessel walls (aneurysms).  Conditions that cause twisting or inflammation of blood vessels, such as fibromuscular dysplasia or arteritis.  A disorder in which blood clots form in the veins (venous thrombosis).  Scarring and thickening (fibrosis) of blood vessels caused by radiation therapy.  A tear in the aorta, the body's main artery (aortic dissection).  Blood vessel problems after illegal drug use, such as use of cocaine.  Tumors in the nervous system (neurofibromatosis).  Certain autoimmune diseases, such as lupus. What increases the risk? The following factors may make you more likely to develop this condition:  Being female.  Being over age 50, especially if you have a history of heart problems.  Smoking.  Having congestive heart failure.  Having an irregular heartbeat (arrhythmia).  Having a history of heart attack or stroke.  Having diabetes.  Having high cholesterol.  Having high blood pressure (hypertension).  Being overweight or obese.  Having kidney disease (renal disease) that requires dialysis. What are the signs or symptoms? Symptoms of this condition include:  Pain or cramps in the abdomen that  develop 15-60 minutes after a meal. This pain may last for 1-3 hours. Some people may develop a fear of eating because of this symptom.  Weight loss.  Diarrhea.  Bloody stool.  Nausea.  Vomiting.  Bloating.  Abdominal pain after stress or with exercise. How is this diagnosed? This condition is diagnosed based on:  Your medical history.  A physical exam.  Tests, such as: ? Ultrasound. ? CT scan. ? Blood tests. ? Urine tests. ? An imaging test that involves injecting a dye into your arteries to show blood flow through blood vessels (angiogram). This can help to show if there are any blockages in the vessels that lead to the intestines. ? Passing a small probe through the mouth and into the stomach to measure the output of carbon dioxide (gastric tonometry). This can help to indicate whether there is decreased blood flow to the stomach and intestines. How is this treated? This condition may be treated with:  Dietary changes such as eating smaller, low-fat, meals more frequently.  Lifestyle changes to treat underlying conditions that contribute to the disease, such as high cholesterol and high blood pressure.  Medicines to reduce blood clotting and increase blood flow.  Surgery to remove the blockage, repair arteries or veins, and restore blood flow. This may involve: ? Angioplasty. This is surgery to widen the affected artery, reduce the blockage, and sometimes insert a small, mesh tube (stent). ? Bypass surgery. This may be done to go around (bypass) the blockage and reconnect healthy arteries or veins. ? Placing a stent in the affected area. This may be done to help keep blocked arteries open. Follow these instructions at home: Eating and drinking   Eat a heart-healthy diet. This   includes fresh fruits and vegetables, whole grains, and lean proteins like chicken, fish, and beans.  Avoid foods that contain a lot of: ? Salt (sodium). ? Sugar. ? Saturated fat (such as  red meat). ? Trans fat (such as in fried foods).  Stay hydrated. Drink enough fluid to keep your urine pale yellow. Lifestyle  Stay active and get regular exercise as told by your health care provider. Aim for 150 minutes of moderate activity or 75 minutes of vigorous activity a week. Ask your health care provider what activities and forms of exercise are safe for you.  Maintain a healthy weight.  Work with your health care provider to manage your cholesterol.  Manage any other health problems you have, such as high blood pressure, diabetes, or heart rhythm problems.  Do not use any products that contain nicotine or tobacco, such as cigarettes, e-cigarettes, and chewing tobacco. If you need help quitting, ask your health care provider. General instructions  Take over-the-counter and prescription medicines only as told by your health care provider.  Keep all follow-up visits as told by your health care provider. This is important.  You may need to take actions to prevent or treat constipation, such as: ? Drink enough fluid to keep your urine pale yellow. ? Take over-the-counter or prescription medicines. ? Eat foods that are high in fiber, such as beans, whole grains, and fresh fruits and vegetables. ? Limit foods that are high in fat and processed sugars, such as fried or sweet foods. Contact a health care provider if:  Your symptoms do not improve or they return after treatment.  You have a fever.  You are constipated. Get help right away if you:  Have severe abdominal pain.  Have severe chest pain.  Have shortness of breath.  Feel weak or dizzy.  Have fast or irregular heartbeats (palpitations).  Have numbness or weakness in your face, arm, or leg.  Are confused.  Have trouble speaking or people have trouble understanding what you are saying.  Have trouble urinating.  Have blood in your stool.  Have severe nausea, vomiting, or persistent diarrhea. These  symptoms may represent a serious problem that is an emergency. Do not wait to see if the symptoms will go away. Get medical help right away. Call your local emergency services (911 in the [place] Do not drive yourself to the hospital. Summary  Mesenteric ischemia is poor circulation in the vessels that supply blood to the the stomach, intestines, and liver (mesenteric organs).  This condition happens when an artery or vein that provides blood to the mesenteric organs gradually becomes blocked or narrow, restricting the blood supply to the organs.  This condition is commonly caused by fatty deposits that build up in an artery (plaque), which can narrow the artery and restrict blood flow.  You are more likely to develop this condition if you are over age 28 and have a history of heart problems, high blood pressure, diabetes, or high cholesterol.  This condition is usually treated with medicines, dietary and lifestyle changes, and surgery to remove the blockage, repair arteries or veins, and restore blood flow. This information is not intended to replace advice given to you by your health care provider. Make sure you discuss any questions you have with your health care provider. Document Released: 09/07/2010 Document Revised: 09/23/2017 Document Reviewed: 09/23/2017 Elsevier Patient Education  2020 [employer].

## 2019-01-24 ENCOUNTER — Encounter: Payer: Self-pay | Admitting: Gastroenterology

## 2019-01-29 ENCOUNTER — Other Ambulatory Visit: Payer: Self-pay

## 2019-01-29 ENCOUNTER — Other Ambulatory Visit: Payer: Self-pay | Admitting: Physician Assistant

## 2019-01-29 ENCOUNTER — Ambulatory Visit
Admission: RE | Admit: 2019-01-29 | Discharge: 2019-01-29 | Disposition: A | Discharge status: Home / Self Care | Payer: BC Managed Care – PPO | Source: Ambulatory Visit | Attending: Physician Assistant | Admitting: Physician Assistant

## 2019-01-29 DIAGNOSIS — R1084 Generalized abdominal pain: Secondary | ICD-10-CM

## 2019-01-29 DIAGNOSIS — R634 Abnormal weight loss: Secondary | ICD-10-CM

## 2019-01-29 DIAGNOSIS — R110 Nausea: Secondary | ICD-10-CM | POA: Diagnosis present

## 2019-01-29 MED ORDER — IOHEXOL 350 MG/ML SOLN
75.0000 mL | Freq: Once | INTRAVENOUS | Status: AC | PRN
  Administered 2019-01-29: 16:00:00 75 mL via INTRAVENOUS

## 2019-02-09 ENCOUNTER — Ambulatory Visit: Payer: Self-pay | Admitting: Physician Assistant

## 2019-02-14 ENCOUNTER — Ambulatory Visit: Payer: BC Managed Care – PPO | Admitting: Gastroenterology

## 2019-02-15 NOTE — Progress Notes (Signed)
[name] Female    DOB: 1955-03-27   64 y.o.   MRN: [id number] Visit Date: 02/16/2019  Today's Provider: Mar Daring, PA-C   Chief Complaint  Patient presents with  . Weight Loss  . Abdominal Pain   Subjective:     HPI    Follow up for Weight loss  The patient was last seen for this 4 weeks ago. Changes made at last visit include Increased Remeron to 30mg  at night.  She reports excellent compliance with treatment. She feels that condition is Unchanged.  Pt reports not having an appetite, feeling nauseous after eating, and trouble with constipation.   She is not having side effects.   She reports that she feels most of her issues are stemming from depression, anxiety and feeling lonely. She finally is comfortable to report that her husband is expected to have been cheating and she feels that is what triggered this current issue. Her husband reports he no longer talks to the female in question but that trust is gone and she gets upset with herself that she cannot trust him to even go to town by himself.  ------------------------------------------------------------------------------------    Allergies  Allergen Reactions  . Diazepam Other (See Comments)    Other reaction(s): Hallucination Patient states "it makes me crazy" REACTION: Behavioral problems  . Ciprofloxacin Other (See Comments)    Other reaction(s): Unknown Loopy  . Codeine Hives and Itching    REACTION: Dizziness  . Metoprolol     Migraine/headaches   . Morphine Hives    Other reaction(s): Unknown  . Niacin And Related Other (See Comments)    flushing  . Topiramate Other (See Comments)  . Toradol [Ketorolac Tromethamine] Itching    Tolerates when taken with Benadryl  . Doxycycline Diarrhea     Current Outpatient Medications:  .  albuterol (PROVENTIL HFA;VENTOLIN HFA) 108 (90 Base) MCG/ACT inhaler, Inhale 2 puffs into the lungs every 6 (six) hours as needed for  wheezing or shortness of breath., Disp: 1 Inhaler, Rfl: 2 .  ALPRAZolam (XANAX) 0.5 MG tablet, Take 1 tablet (0.5 mg total) by mouth 3 (three) times daily as needed for anxiety., Disp: 90 tablet, Rfl: 0 .  Ascorbic Acid (VITAMIN C) 1000 MG tablet, Take 1,000 mg by mouth daily., Disp: , Rfl:  .  baclofen (LIORESAL) 10 MG tablet, 3 (three) times daily. , Disp: , Rfl: 2 .  budesonide-formoterol (SYMBICORT) 160-4.5 MCG/ACT inhaler, Inhale 1 puff into the lungs 2 (two) times daily., Disp: 1 Inhaler, Rfl: 0 .  DULoxetine (CYMBALTA) 20 MG capsule, Take by mouth., Disp: , Rfl:  .  Erenumab-aooe (AIMOVIG) 140 MG/ML SOAJ, Inject 140 mg into the skin every 28 (twenty-eight) days., Disp: , Rfl:  .  gabapentin (NEURONTIN) 100 MG capsule, Take 300 mg by mouth at bedtime. , Disp: , Rfl:  .  HYDROcodone-acetaminophen (NORCO/VICODIN) 5-325 MG tablet, Take 1 tablet by mouth every 6 (six) hours as needed. For pain, Disp: 30 tablet, Rfl: 0 .  linaclotide (LINZESS) 145 MCG CAPS capsule, Take 1 capsule (145 mcg total) by mouth daily before breakfast., Disp: 90 capsule, Rfl: 3 .  lipase/protease/amylase (CREON) 36000 UNITS CPEP capsule, Take 2 caps with each meal and 1 with a snack, Disp: 210 capsule, Rfl: 3 .  loratadine (CLARITIN) 10 MG tablet, Take 10 mg by mouth 2 (two) times daily., Disp: , Rfl:  .  mirtazapine (REMERON) 15 MG tablet, TAKE 1 TABLET BY MOUTH AT  BEDTIME, Disp: 30 tablet, Rfl: 0 .  Multiple Vitamin (MULTIVITAMIN) tablet, Take 1 tablet by mouth daily., Disp: , Rfl:  .  pantoprazole (PROTONIX) 40 MG tablet, TAKE 1 TABLET BY MOUTH ONCE DAILY, Disp: 90 tablet, Rfl: 1 .  Probiotic Product (PROBIOTIC-10) CHEW, Chew 2 tablets by mouth daily., Disp: , Rfl:  .  promethazine (PHENERGAN) 25 MG tablet, Take 1 tablet (25 mg total) by mouth every 8 (eight) hours as needed for nausea or vomiting., Disp: 30 tablet, Rfl: 0 .  QUEtiapine (SEROQUEL) 50 MG tablet, Take 1 tablet (50 mg total) by mouth at bedtime. (Patient  taking differently: Take 50 mg by mouth at bedtime. Takes 2 25mg  tablets), Disp: 90 tablet, Rfl: 3 .  simvastatin (ZOCOR) 20 MG tablet, Take 1 tablet (20 mg total) by mouth at bedtime., Disp: 90 tablet, Rfl: 3 .  Specialty Vitamins Products (BIOTIN PLUS KERATIN) 10000-100 MCG-MG TABS, Take 1 tablet by mouth daily., Disp: , Rfl:  .  traZODone (DESYREL) 100 MG tablet, Take 100 mg by mouth at bedtime., Disp: , Rfl:   Review of Systems  Constitutional: Positive for appetite change and fatigue. Negative for activity change, chills, diaphoresis, fever and unexpected weight change.  Respiratory: Negative.   Cardiovascular: Negative.   Gastrointestinal: Positive for abdominal pain, constipation and nausea. Negative for abdominal distention, anal bleeding, blood in stool, diarrhea, rectal pain and vomiting.  Neurological: Negative for dizziness, light-headedness and headaches.  Psychiatric/Behavioral: Positive for agitation, dysphoric mood and sleep disturbance. The patient is nervous/anxious.     Social History   Tobacco Use  . Smoking status: Current Every Day Smoker    Packs/day: 0.50    Years: 30.00    Pack years: 15.00    Types: Cigarettes  . Smokeless tobacco: Never Used  Substance Use Topics  . Alcohol use: Not Currently    Alcohol/week: 0.0 standard drinks      Objective:   BP 120/81 (BP Location: Left Arm, Patient Position: Sitting, Cuff Size: Small)   Pulse 83   Temp (!) 97.3 F (36.3 C) (Temporal)   Wt 90 lb (40.8 kg)   BMI 17.01 kg/m  Vitals:   02/16/19 0704  BP: 120/81  Pulse: 83  Temp: (!) 97.3 F (36.3 C)  TempSrc: Temporal  Weight: 90 lb (40.8 kg)  Body mass index is 17.01 kg/m.   Physical Exam Vitals reviewed.  Constitutional:      General: She is not in acute distress.    Appearance: She is well-developed and underweight.  HENT:     Head: Normocephalic and atraumatic.  Pulmonary:     Effort: Pulmonary effort is normal. No respiratory distress.    Musculoskeletal:     Cervical back: Normal range of motion and neck supple.  Neurological:     Mental Status: She is alert.  Psychiatric:        Attention and Perception: Attention and perception normal.        Mood and Affect: Mood is anxious and depressed. Affect is tearful.        Speech: Speech normal.        Behavior: Behavior normal. Behavior is cooperative.        Thought Content: Thought content normal.        Judgment: Judgment normal.      No results found for any visits on 02/16/19.     Assessment & Plan    1. Mood disorder (HCC) Increase seroquel as below to 75 mg. Call if not  working and need another increase. - QUEtiapine (SEROQUEL) 50 MG tablet; Take 1.5 tablets (75 mg total) by mouth at bedtime.  2. Migraine without aura and without status migrainosus, not intractable See above medical treatment plan. - QUEtiapine (SEROQUEL) 50 MG tablet; Take 1.5 tablets (75 mg total) by mouth at bedtime.  3. Weight loss Stable. Diagnosis pulled for medication refill. Continue current medical treatment plan. Discussed therapy/counseling and couples counseling. She declines at this time. [facility]chure for [facility] was given to patient in case she changes her mind and desires.  - mirtazapine (REMERON) 30 MG tablet; Take 1 tablet (30 mg total) by mouth at bedtime.  Dispense: 90 tablet; Refill: 1  4. Irritable bowel syndrome with diarrhea Increase Linzess to 237mcg to see if better response obtained. Samples provided today. If more effective she can let me know and will send in the 290.  - linaclotide (LINZESS) 145 MCG CAPS capsule; Take 2 capsules (290 mcg total) by mouth daily before breakfast.  Dispense: 90 capsule; Refill: [place], PA-C  [facility]

## 2019-02-16 ENCOUNTER — Encounter: Payer: Self-pay | Admitting: Physician Assistant

## 2019-02-16 ENCOUNTER — Telehealth: Payer: Self-pay

## 2019-02-16 ENCOUNTER — Ambulatory Visit: Payer: BC Managed Care – PPO | Admitting: Physician Assistant

## 2019-02-16 ENCOUNTER — Other Ambulatory Visit: Payer: Self-pay

## 2019-02-16 VITALS — BP 120/81 | HR 83 | Temp 97.3°F | Wt 90.0 lb

## 2019-02-16 DIAGNOSIS — K580 Irritable bowel syndrome with diarrhea: Secondary | ICD-10-CM | POA: Diagnosis not present

## 2019-02-16 DIAGNOSIS — R634 Abnormal weight loss: Secondary | ICD-10-CM | POA: Diagnosis not present

## 2019-02-16 DIAGNOSIS — G43009 Migraine without aura, not intractable, without status migrainosus: Secondary | ICD-10-CM

## 2019-02-16 DIAGNOSIS — F39 Unspecified mood [affective] disorder: Secondary | ICD-10-CM

## 2019-02-16 MED ORDER — LINACLOTIDE 145 MCG PO CAPS: 290.0000 ug | ORAL_CAPSULE | Freq: Every day | ORAL | 3 refills | Status: DC

## 2019-02-16 MED ORDER — MIRTAZAPINE 30 MG PO TABS: 30.0000 mg | ORAL_TABLET | Freq: Every day | ORAL | 1 refills | Status: DC

## 2019-02-16 MED ORDER — QUETIAPINE FUMARATE 50 MG PO TABS: 75.0000 mg | ORAL_TABLET | Freq: Every day | ORAL | Status: DC

## 2019-02-16 NOTE — Patient Instructions (Signed)
10 Relaxation Techniques That Zap Stress Fast By Jeannette Moninger   Listen  Relax. You deserve it, it's good for you, and it takes less time than you think. You don't need a spa weekend or a retreat. Each of these stress-relieving tips can get you from OMG to om in less than 15 minutes. 1. Meditate  A few minutes of practice per day can help ease anxiety. "Research suggests that daily meditation may alter the brain's neural pathways, making you more resilient to stress," says psychologist Robbie Maller Hartman, PhD, a [place] health and wellness coach. It's simple. Sit up straight with both feet on the floor. Close your eyes. Focus your attention on reciting -- out loud or silently -- a positive mantra such as "I feel at peace" or "I love myself." Place one hand on your belly to sync the mantra with your breaths. Let any distracting thoughts float by like clouds. 2. Breathe Deeply  Take a 5-minute break and focus on your breathing. Sit up straight, eyes closed, with a hand on your belly. Slowly inhale through your nose, feeling the breath start in your abdomen and work its way to the top of your head. Reverse the process as you exhale through your mouth.  "Deep breathing counters the effects of stress by slowing the heart rate and lowering blood pressure," psychologist Judith Tutin, PhD, says. She's a certified life coach in [place], [place] 3. Be Present  Slow down.  "Take 5 minutes and focus on only one behavior with awareness," Tutin says. Notice how the air feels on your face when you're walking and how your feet feel hitting the ground. Enjoy the texture and taste of each bite of food. When you spend time in the moment and focus on your senses, you should feel less tense. 4. Reach Out  Your social network is one of your best tools for handling stress. Talk to others -- preferably face to face, or at least on the phone. Share what's going on. You can get a fresh perspective while keeping your  connection strong. 5. Tune In to Your Body  Mentally scan your body to get a sense of how stress affects it each day. Lie on your back, or sit with your feet on the floor. Start at your toes and work your way up to your scalp, noticing how your body feels.  "Simply be aware of places you feel tight or loose without trying to change anything," Tutin says. For 1 to 2 minutes, imagine each deep breath flowing to that body part. Repeat this process as you move your focus up your body, paying close attention to sensations you feel in each body part. 6. Decompress  Place a warm heat wrap around your neck and shoulders for 10 minutes. Close your eyes and relax your face, neck, upper chest, and back muscles. Remove the wrap, and use a tennis ball or foam roller to massage away tension.  "Place the ball between your back and the wall. Lean into the ball, and hold gentle pressure for up to 15 seconds. Then move the ball to another spot, and apply pressure," says Cathy Benninger, a nurse practitioner and assistant professor at [employer] [facility] in [place]. 7. Laugh Out Loud  A good belly laugh doesn't just lighten the load mentally. It lowers cortisol, your body's stress hormone, and boosts brain chemicals called endorphins, which help your mood. Lighten up by tuning in to your favorite sitcom or video, reading   the comics, or chatting with someone who makes you smile. 8. Crank Up the Tunes  Research shows that listening to soothing music can lower blood pressure, heart rate, and anxiety. "Create a playlist of songs or nature sounds (the ocean, a bubbling brook, birds chirping), and allow your mind to focus on the different melodies, instruments, or singers in the piece," Benninger says. You also can blow off steam by rocking out to more upbeat tunes -- or singing at the top of your lungs! 9. Get Moving  You don't have to run in order to get a runner's high. All forms of exercise,  including yoga and walking, can ease depression and anxiety by helping the brain release feel-good chemicals and by giving your body a chance to practice dealing with stress. You can go for a quick walk around the block, take the stairs up and down a few flights, or do some stretching exercises like head rolls and shoulder shrugs. 10. Be Grateful  Keep a gratitude journal or several (one by your bed, one in your purse, and one at work) to help you remember all the things that are good in your life.  "Being grateful for your blessings cancels out negative thoughts and worries," says Joni Emmerling, a wellness coach in [place], [place].  Use these journals to savor good experiences like a child's smile, a sunshine-filled day, and good health. Don't forget to celebrate accomplishments like mastering a new task at work or a new hobby. When you start feeling stressed, spend a few minutes looking through your notes to remind yourself what really matters.   

## 2019-02-16 NOTE — Telephone Encounter (Signed)
Left message advising [facility]   Thanks,  Mickel Baas

## 2019-02-16 NOTE — Telephone Encounter (Signed)
Ignore refill request at this time. Accidentally sent. Patient is trying samples of Linzess 221mcg. Will change dose once I know if effective.

## 2019-02-16 NOTE — Telephone Encounter (Signed)
Copied from [place] [phone]. Topic: General - Inquiry >> Feb 16, 2019  9:45 AM Mathis Bud wrote: Reason for CRM: [facility] is calling to figure out medication refill  linaclotide (LINZESS) 145 MCG CAPS capsule  He is wondering how much medication she needs to take and strength if it is for 90 days Gerald Stabs call back [phone] >> Feb 16, 2019  4:51 PM Wynetta Emery, [place] C wrote: Pharmacy called in to discuss the directions and dose on Rx for linaclotide (LINZESS) 145 MCG CAPS capsule    Please advise.

## 2019-02-21 ENCOUNTER — Ambulatory Visit: Payer: BC Managed Care – PPO | Admitting: Family Medicine

## 2019-02-21 ENCOUNTER — Encounter: Payer: Self-pay | Admitting: Family Medicine

## 2019-02-21 VITALS — Ht 60.0 in | Wt 90.0 lb

## 2019-02-21 DIAGNOSIS — G43711 Chronic migraine without aura, intractable, with status migrainosus: Secondary | ICD-10-CM

## 2019-02-21 MED ORDER — PROMETHAZINE HCL 25 MG/ML IJ SOLN
25.0000 mg | Freq: Once | INTRAMUSCULAR | Status: AC
  Administered 2019-02-21: 14:00:00 25 mg via INTRAMUSCULAR

## 2019-02-21 MED ORDER — KETOROLAC TROMETHAMINE 60 MG/2ML IM SOLN
60.0000 mg | Freq: Once | INTRAMUSCULAR | Status: AC
  Administered 2019-02-21: 14:00:00 30 mg via INTRAMUSCULAR

## 2019-02-21 MED ORDER — DIPHENHYDRAMINE HCL 50 MG/ML IJ SOLN
50.0000 mg | Freq: Once | INTRAMUSCULAR | Status: AC
  Administered 2019-02-21: 14:00:00 50 mg via INTRAMUSCULAR

## 2019-02-21 MED ORDER — NURTEC 75 MG PO TBDP: 75.0000 mg | ORAL_TABLET | ORAL | 2 refills | Status: DC | PRN

## 2019-02-21 NOTE — Patient Instructions (Signed)

## 2019-02-21 NOTE — Progress Notes (Signed)
[name] Female    DOB: 03/16/1955   64 y.o.   MRN: [id number] Visit Date: 02/21/2019  Today's Provider: Lavon Paganini, MD   Chief Complaint  Patient presents with  . Migraine   Subjective:    Virtual Visit via Telephone Note  I connected with Jordan Ortega on 02/21/19 at  2:20 PM EST by telephone and verified that I am speaking with the correct person using two identifiers.  Location: Patient location: home Provider location: [facility] Persons involved in the visit: patient, provider   I discussed the limitations, risks, security and privacy concerns of performing an evaluation and management service by telephone and the availability of in person appointments. I also discussed with the patient that there may be a patient responsible charge related to this service. The patient expressed understanding and agreed to proceed.   Migraine  This is a recurrent problem. The current episode started today. The problem occurs constantly. The problem has been gradually worsening. The pain is located in the frontal and left unilateral region. The pain does not radiate. The pain quality is similar to prior headaches. The quality of the pain is described as aching and sharp. The pain is at a severity of 9/10. The pain is moderate. Associated symptoms include blurred vision. Pertinent negatives include no coughing, dizziness, fever, nausea, numbness or weakness. Nothing aggravates the symptoms. She has tried oral narcotics for the symptoms. The treatment provided no relief. Her past medical history is significant for migraine headaches.  Patient reports she did get Aimovig injection last month (has been on for >1 yr). Patient reports she is not due for her dose this month.   Took Hydrocodone and phenergan at 6am this morning - no relief.  She has tried Imitrex, Maxalt, and Zom,ig previously and got side effects and no efficacy.  She has never tried  [employer].  She has had success with migraine cocktails in our clinic before.  Allergies  Allergen Reactions  . Diazepam Other (See Comments)    Other reaction(s): Hallucination Patient states "it makes me crazy" REACTION: Behavioral problems  . Ciprofloxacin Other (See Comments)    Other reaction(s): Unknown Loopy  . Codeine Hives and Itching    REACTION: Dizziness  . Metoprolol     Migraine/headaches   . Morphine Hives    Other reaction(s): Unknown  . Niacin And Related Other (See Comments)    flushing  . Topiramate Other (See Comments)  . Toradol [Ketorolac Tromethamine] Itching    Tolerates when taken with Benadryl  . Doxycycline Diarrhea     Current Outpatient Medications:  .  albuterol (PROVENTIL HFA;VENTOLIN HFA) 108 (90 Base) MCG/ACT inhaler, Inhale 2 puffs into the lungs every 6 (six) hours as needed for wheezing or shortness of breath., Disp: 1 Inhaler, Rfl: 2 .  ALPRAZolam (XANAX) 0.5 MG tablet, Take 1 tablet (0.5 mg total) by mouth 3 (three) times daily as needed for anxiety., Disp: 90 tablet, Rfl: 0 .  Ascorbic Acid (VITAMIN C) 1000 MG tablet, Take 1,000 mg by mouth daily., Disp: , Rfl:  .  baclofen (LIORESAL) 10 MG tablet, 3 (three) times daily. , Disp: , Rfl: 2 .  budesonide-formoterol (SYMBICORT) 160-4.5 MCG/ACT inhaler, Inhale 1 puff into the lungs 2 (two) times daily., Disp: 1 Inhaler, Rfl: 0 .  DULoxetine (CYMBALTA) 20 MG capsule, Take by mouth., Disp: , Rfl:  .  Erenumab-aooe (AIMOVIG) 140 MG/ML SOAJ, Inject 140 mg into the skin  every 28 (twenty-eight) days., Disp: , Rfl:  .  gabapentin (NEURONTIN) 100 MG capsule, Take 300 mg by mouth at bedtime. , Disp: , Rfl:  .  HYDROcodone-acetaminophen (NORCO/VICODIN) 5-325 MG tablet, Take 1 tablet by mouth every 6 (six) hours as needed. For pain, Disp: 30 tablet, Rfl: 0 .  linaclotide (LINZESS) 145 MCG CAPS capsule, Take 2 capsules (290 mcg total) by mouth daily before breakfast., Disp: 90 capsule, Rfl: 3 .   lipase/protease/amylase (CREON) 36000 UNITS CPEP capsule, Take 2 caps with each meal and 1 with a snack, Disp: 210 capsule, Rfl: 3 .  loratadine (CLARITIN) 10 MG tablet, Take 10 mg by mouth 2 (two) times daily., Disp: , Rfl:  .  mirtazapine (REMERON) 30 MG tablet, Take 1 tablet (30 mg total) by mouth at bedtime., Disp: 90 tablet, Rfl: 1 .  Multiple Vitamin (MULTIVITAMIN) tablet, Take 1 tablet by mouth daily., Disp: , Rfl:  .  pantoprazole (PROTONIX) 40 MG tablet, TAKE 1 TABLET BY MOUTH ONCE DAILY, Disp: 90 tablet, Rfl: 1 .  Probiotic Product (PROBIOTIC-10) CHEW, Chew 2 tablets by mouth daily., Disp: , Rfl:  .  promethazine (PHENERGAN) 25 MG tablet, Take 1 tablet (25 mg total) by mouth every 8 (eight) hours as needed for nausea or vomiting., Disp: 30 tablet, Rfl: 0 .  QUEtiapine (SEROQUEL) 50 MG tablet, Take 1.5 tablets (75 mg total) by mouth at bedtime., Disp: , Rfl:  .  simvastatin (ZOCOR) 20 MG tablet, Take 1 tablet (20 mg total) by mouth at bedtime., Disp: 90 tablet, Rfl: 3 .  Specialty Vitamins Products (BIOTIN PLUS KERATIN) 10000-100 MCG-MG TABS, Take 1 tablet by mouth daily., Disp: , Rfl:  .  traZODone (DESYREL) 100 MG tablet, Take 100 mg by mouth at bedtime., Disp: , Rfl:   Review of Systems  Constitutional: Negative for chills and fever.  Eyes: Positive for blurred vision and visual disturbance.  Respiratory: Negative for cough, chest tightness, shortness of breath and wheezing.   Cardiovascular: Negative for chest pain, palpitations and leg swelling.  Gastrointestinal: Negative for nausea.  Neurological: Positive for headaches. Negative for dizziness, tremors, weakness, light-headedness and numbness.  Psychiatric/Behavioral: Negative for agitation, behavioral problems, confusion, decreased concentration and sleep disturbance. The patient is not nervous/anxious.     Social History   Tobacco Use  . Smoking status: Current Every Day Smoker    Packs/day: 0.50    Years: 30.00    Pack  years: 15.00    Types: Cigarettes  . Smokeless tobacco: Never Used  Substance Use Topics  . Alcohol use: Not Currently    Alcohol/week: 0.0 standard drinks      Objective:   Ht 5' (1.524 m)   Wt 90 lb (40.8 kg)   BMI 17.58 kg/m  Vitals:   02/21/19 1151  Weight: 90 lb (40.8 kg)  Height: 5' (1.524 m)  Body mass index is 17.58 kg/m.   Physical Exam Speaking in full sentences in no apparent distress.  Sounds like she is in pain.  No results found for any visits on 02/21/19.     Assessment & Plan    I discussed the assessment and treatment plan with the patient. The patient was provided an opportunity to ask questions and all were answered. The patient agreed with the plan and demonstrated an understanding of the instructions.   The patient was advised to call back or seek an in-person evaluation if the symptoms worsen or if the condition fails to improve as anticipated.   1.  Intractable chronic migraine without aura and with status migrainosus -Patient currently with intractable migraine that is similar to previous migraines -No red flag signs or symptoms -Neuro intact -She does not have any other symptoms suggestive of COVID-19 infection -She may be a good candidate for Nurtec as needed for migraine treatment as she is doing well on Aimovig for migraine prophylaxis-will likely need to complete prior authorization and state that she has failed Zomig, Maxalt, Imitrex previously. -She does not get nearly as many migraines as she used to, since starting Aimovig -She has done well with migraine cocktails in the clinic previously, so she will have someone drive her to our clinic today after a virtual visit to get IM Toradol, Benadryl, Phenergan to hopefully abort this migraine. -Discussed return precautions   Meds ordered this encounter  Medications  . Rimegepant Sulfate (NURTEC) 75 MG TBDP    Sig: Take 75 mg by mouth every other day as needed (migraine).    Dispense:  15  tablet    Refill:  2  . ketorolac (TORADOL) injection 60 mg  . diphenhydrAMINE (BENADRYL) injection 50 mg  . promethazine (PHENERGAN) injection 25 mg    Follow-up as needed  The entirety of the information documented in the History of Present Illness, Review of Systems and Physical Exam were personally obtained by me. Portions of this information were initially documented by Lynford Humphrey, CMA and reviewed by me for thoroughness and accuracy.    Nyjai Graff, Dionne Bucy, MD MPH [facility]

## 2019-03-13 ENCOUNTER — Encounter: Payer: Self-pay | Admitting: Physician Assistant

## 2019-03-13 DIAGNOSIS — G43009 Migraine without aura, not intractable, without status migrainosus: Secondary | ICD-10-CM

## 2019-03-13 DIAGNOSIS — R634 Abnormal weight loss: Secondary | ICD-10-CM

## 2019-03-13 DIAGNOSIS — F3175 Bipolar disorder, in partial remission, most recent episode depressed: Secondary | ICD-10-CM

## 2019-03-13 DIAGNOSIS — F39 Unspecified mood [affective] disorder: Secondary | ICD-10-CM

## 2019-03-13 DIAGNOSIS — F339 Major depressive disorder, recurrent, unspecified: Secondary | ICD-10-CM

## 2019-03-13 DIAGNOSIS — G43409 Hemiplegic migraine, not intractable, without status migrainosus: Secondary | ICD-10-CM

## 2019-03-13 DIAGNOSIS — K581 Irritable bowel syndrome with constipation: Secondary | ICD-10-CM

## 2019-03-13 MED ORDER — LINACLOTIDE 290 MCG PO CAPS: 290.0000 ug | ORAL_CAPSULE | Freq: Every day | ORAL | 1 refills | Status: DC

## 2019-03-13 MED ORDER — MIRTAZAPINE 45 MG PO TABS: 45.0000 mg | ORAL_TABLET | Freq: Every day | ORAL | 1 refills | Status: DC

## 2019-03-13 NOTE — Telephone Encounter (Signed)
See my chart message

## 2019-03-15 MED ORDER — GABAPENTIN 300 MG PO CAPS: 300.0000 mg | ORAL_CAPSULE | Freq: Every day | ORAL | 3 refills | Status: DC

## 2019-03-15 MED ORDER — DULOXETINE HCL 20 MG PO CPEP: 40.0000 mg | ORAL_CAPSULE | Freq: Every day | ORAL | 3 refills | Status: DC

## 2019-03-15 MED ORDER — BACLOFEN 10 MG PO TABS: 10.0000 mg | ORAL_TABLET | Freq: Three times a day (TID) | ORAL | 3 refills | Status: DC

## 2019-03-15 MED ORDER — TRAZODONE HCL 100 MG PO TABS: 100.0000 mg | ORAL_TABLET | Freq: Every day | ORAL | 3 refills | Status: DC

## 2019-03-15 MED ORDER — QUETIAPINE FUMARATE 25 MG PO TABS: 75.0000 mg | ORAL_TABLET | Freq: Every day | ORAL | 3 refills | Status: DC

## 2019-03-15 NOTE — Addendum Note (Signed)
Addended by: Mar Daring on: 03/15/2019 02:00 PM   Modules accepted: Orders

## 2019-03-21 ENCOUNTER — Encounter: Payer: Self-pay | Admitting: Family Medicine

## 2019-03-21 ENCOUNTER — Ambulatory Visit: Payer: BC Managed Care – PPO | Attending: Internal Medicine

## 2019-03-21 ENCOUNTER — Ambulatory Visit (INDEPENDENT_AMBULATORY_CARE_PROVIDER_SITE_OTHER): Payer: BC Managed Care – PPO | Admitting: Family Medicine

## 2019-03-21 DIAGNOSIS — Z20822 Contact with and (suspected) exposure to covid-19: Secondary | ICD-10-CM

## 2019-03-21 NOTE — Patient Instructions (Signed)
COVID-19 COVID-19 is a respiratory infection that is caused by a virus called severe acute respiratory syndrome coronavirus 2 (SARS-CoV-2). The disease is also known as coronavirus disease or novel coronavirus. In some people, the virus may not cause any symptoms. In others, it may cause a serious infection. The infection can get worse quickly and can lead to complications, such as:  Pneumonia, or infection of the lungs.  Acute respiratory distress syndrome or ARDS. This is a condition in which fluid build-up in the lungs prevents the lungs from filling with air and passing oxygen into the blood.  Acute respiratory failure. This is a condition in which there is not enough oxygen passing from the lungs to the body or when carbon dioxide is not passing from the lungs out of the body.  Sepsis or septic shock. This is a serious bodily reaction to an infection.  Blood clotting problems.  Secondary infections due to bacteria or fungus.  Organ failure. This is when your body's organs stop working. The virus that causes COVID-19 is contagious. This means that it can spread from person to person through droplets from coughs and sneezes (respiratory secretions). What are the causes? This illness is caused by a virus. You may catch the virus by:  Breathing in droplets from an infected person. Droplets can be spread by a person breathing, speaking, singing, coughing, or sneezing.  Touching something, like a table or a doorknob, that was exposed to the virus (contaminated) and then touching your mouth, nose, or eyes. What increases the risk? Risk for infection You are more likely to be infected with this virus if you:  Are within 6 feet (2 meters) of a person with COVID-19.  Provide care for or live with a person who is infected with COVID-19.  Spend time in crowded indoor spaces or live in shared housing. Risk for serious illness You are more likely to become seriously ill from the virus if you:   Are 50 years of age or older. The higher your age, the more you are at risk for serious illness.  Live in a nursing home or long-term care facility.  Have cancer.  Have a long-term (chronic) disease such as: ? Chronic lung disease, including chronic obstructive pulmonary disease or asthma. ? A long-term disease that lowers your body's ability to fight infection (immunocompromised). ? Heart disease, including heart failure, a condition in which the arteries that lead to the heart become narrow or blocked (coronary artery disease), a disease which makes the heart muscle thick, weak, or stiff (cardiomyopathy). ? Diabetes. ? Chronic kidney disease. ? Sickle cell disease, a condition in which red blood cells have an abnormal "sickle" shape. ? Liver disease.  Are obese. What are the signs or symptoms? Symptoms of this condition can range from mild to severe. Symptoms may appear any time from 2 to 14 days after being exposed to the virus. They include:  A fever or chills.  A cough.  Difficulty breathing.  Headaches, body aches, or muscle aches.  Runny or stuffy (congested) nose.  A sore throat.  New loss of taste or smell. Some people may also have stomach problems, such as nausea, vomiting, or diarrhea. Other people may not have any symptoms of COVID-19. How is this diagnosed? This condition may be diagnosed based on:  Your signs and symptoms, especially if: ? You live in an area with a COVID-19 outbreak. ? You recently traveled to or from an area where the virus is common. ? You   provide care for or live with a person who was diagnosed with COVID-19. ? You were exposed to a person who was diagnosed with COVID-19.  A physical exam.  Lab tests, which may include: ? Taking a sample of fluid from the back of your nose and throat (nasopharyngeal fluid), your nose, or your throat using a swab. ? A sample of mucus from your lungs (sputum). ? Blood tests.  Imaging tests, which  may include, X-rays, CT scan, or ultrasound. How is this treated? At present, there is no medicine to treat COVID-19. Medicines that treat other diseases are being used on a trial basis to see if they are effective against COVID-19. Your health care provider will talk with you about ways to treat your symptoms. For most people, the infection is mild and can be managed at home with rest, fluids, and over-the-counter medicines. Treatment for a serious infection usually takes places in a hospital intensive care unit (ICU). It may include one or more of the following treatments. These treatments are given until your symptoms improve.  Receiving fluids and medicines through an IV.  Supplemental oxygen. Extra oxygen is given through a tube in the nose, a face mask, or a hood.  Positioning you to lie on your stomach (prone position). This makes it easier for oxygen to get into the lungs.  Continuous positive airway pressure (CPAP) or bi-level positive airway pressure (BPAP) machine. This treatment uses mild air pressure to keep the airways open. A tube that is connected to a motor delivers oxygen to the body.  Ventilator. This treatment moves air into and out of the lungs by using a tube that is placed in your windpipe.  Tracheostomy. This is a procedure to create a hole in the neck so that a breathing tube can be inserted.  Extracorporeal membrane oxygenation (ECMO). This procedure gives the lungs a chance to recover by taking over the functions of the heart and lungs. It supplies oxygen to the body and removes carbon dioxide. Follow these instructions at home: Lifestyle  If you are sick, stay home except to get medical care. Your health care provider will tell you how long to stay home. Call your health care provider before you go for medical care.  Rest at home as told by your health care provider.  Do not use any products that contain nicotine or tobacco, such as cigarettes, e-cigarettes, and  chewing tobacco. If you need help quitting, ask your health care provider.  Return to your normal activities as told by your health care provider. Ask your health care provider what activities are safe for you. General instructions  Take over-the-counter and prescription medicines only as told by your health care provider.  Drink enough fluid to keep your urine pale yellow.  Keep all follow-up visits as told by your health care provider. This is important. How is this prevented?  There is no vaccine to help prevent COVID-19 infection. However, there are steps you can take to protect yourself and others from this virus. To protect yourself:   Do not travel to areas where COVID-19 is a risk. The areas where COVID-19 is reported change often. To identify high-risk areas and travel restrictions, check the CDC travel website: wwwnc.cdc.gov/travel/notices  If you live in, or must travel to, an area where COVID-19 is a risk, take precautions to avoid infection. ? Stay away from people who are sick. ? Wash your hands often with soap and water for 20 seconds. If soap and water   are not available, use an alcohol-based hand sanitizer. ? Avoid touching your mouth, face, eyes, or nose. ? Avoid going out in public, follow guidance from your state and local health authorities. ? If you must go out in public, wear a cloth face covering or face mask. Make sure your mask covers your nose and mouth. ? Avoid crowded indoor spaces. Stay at least 6 feet (2 meters) away from others. ? Disinfect objects and surfaces that are frequently touched every day. This may include:  Counters and tables.  Doorknobs and light switches.  Sinks and faucets.  Electronics, such as phones, remote controls, keyboards, computers, and tablets. To protect others: If you have symptoms of COVID-19, take steps to prevent the virus from spreading to others.  If you think you have a COVID-19 infection, contact your health care  provider right away. Tell your health care team that you think you may have a COVID-19 infection.  Stay home. Leave your house only to seek medical care. Do not use public transport.  Do not travel while you are sick.  Wash your hands often with soap and water for 20 seconds. If soap and water are not available, use alcohol-based hand sanitizer.  Stay away from other members of your household. Let healthy household members care for children and pets, if possible. If you have to care for children or pets, wash your hands often and wear a mask. If possible, stay in your own room, separate from others. Use a different bathroom.  Make sure that all people in your household wash their hands well and often.  Cough or sneeze into a tissue or your sleeve or elbow. Do not cough or sneeze into your hand or into the air.  Wear a cloth face covering or face mask. Make sure your mask covers your nose and mouth. Where to find more information  Centers for Disease Control and Prevention: www.cdc.gov/coronavirus/2019-ncov/index.html  World Health Organization: www.who.int/health-topics/coronavirus Contact a health care provider if:  You live in or have traveled to an area where COVID-19 is a risk and you have symptoms of the infection.  You have had contact with someone who has COVID-19 and you have symptoms of the infection. Get help right away if:  You have trouble breathing.  You have pain or pressure in your chest.  You have confusion.  You have bluish lips and fingernails.  You have difficulty waking from sleep.  You have symptoms that get worse. These symptoms may represent a serious problem that is an emergency. Do not wait to see if the symptoms will go away. Get medical help right away. Call your local emergency services (911 in the [place] Do not drive yourself to the hospital. Let the emergency medical personnel know if you think you have COVID-19. Summary  COVID-19 is a  respiratory infection that is caused by a virus. It is also known as coronavirus disease or novel coronavirus. It can cause serious infections, such as pneumonia, acute respiratory distress syndrome, acute respiratory failure, or sepsis.  The virus that causes COVID-19 is contagious. This means that it can spread from person to person through droplets from breathing, speaking, singing, coughing, or sneezing.  You are more likely to develop a serious illness if you are 50 years of age or older, have a weak immune system, live in a nursing home, or have chronic disease.  There is no medicine to treat COVID-19. Your health care provider will talk with you about ways to treat your symptoms.    Take steps to protect yourself and others from infection. Wash your hands often and disinfect objects and surfaces that are frequently touched every day. Stay away from people who are sick and wear a mask if you are sick. This information is not intended to replace advice given to you by your health care provider. Make sure you discuss any questions you have with your health care provider. Document Revised: 11/17/2018 Document Reviewed: 02/23/2018 Elsevier Patient Education  2020 Elsevier Inc.  

## 2019-03-21 NOTE — Progress Notes (Signed)
[name] Female    DOB: 1955-06-30   64 y.o.   MRN: [id number] Visit Date: 03/21/2019  Today's Provider: Lavon Paganini, MD   Chief Complaint  Patient presents with  . URI   Subjective:    Virtual Visit via Telephone Note  I connected with Jordan Ortega on 03/21/19 at 10:20 AM EST by telephone and verified that I am speaking with the correct person using two identifiers.  Location: Patient location: home Provider location: [facility] Persons involved in the visit: patient, provider   I discussed the limitations, risks, security and privacy concerns of performing an evaluation and management service by telephone and the availability of in person appointments. I also discussed with the patient that there may be a patient responsible charge related to this service. The patient expressed understanding and agreed to proceed.  URI  This is a new problem. Episode onset: Monday. The problem has been gradually worsening. There has been no fever. Associated symptoms include coughing, rhinorrhea and sinus pain. She has tried acetaminophen (DyQuil ) for the symptoms. The treatment provided no relief.   granddaughter had similar symptoms No SOB, no production with cough   Allergies  Allergen Reactions  . Diazepam Other (See Comments)    Other reaction(s): Hallucination Patient states "it makes me crazy" REACTION: Behavioral problems  . Ciprofloxacin Other (See Comments)    Other reaction(s): Unknown Loopy  . Codeine Hives and Itching    REACTION: Dizziness  . Metoprolol     Migraine/headaches   . Morphine Hives    Other reaction(s): Unknown  . Niacin And Related Other (See Comments)    flushing  . Topiramate Other (See Comments)  . Toradol [Ketorolac Tromethamine] Itching    Tolerates when taken with Benadryl  . Doxycycline Diarrhea     Current Outpatient Medications:  .  albuterol (PROVENTIL HFA;VENTOLIN HFA) 108 (90 Base)  MCG/ACT inhaler, Inhale 2 puffs into the lungs every 6 (six) hours as needed for wheezing or shortness of breath., Disp: 1 Inhaler, Rfl: 2 .  ALPRAZolam (XANAX) 0.5 MG tablet, Take 1 tablet (0.5 mg total) by mouth 3 (three) times daily as needed for anxiety., Disp: 90 tablet, Rfl: 0 .  Ascorbic Acid (VITAMIN C) 1000 MG tablet, Take 1,000 mg by mouth daily., Disp: , Rfl:  .  baclofen (LIORESAL) 10 MG tablet, Take 1 tablet (10 mg total) by mouth 3 (three) times daily., Disp: 270 tablet, Rfl: 3 .  budesonide-formoterol (SYMBICORT) 160-4.5 MCG/ACT inhaler, Inhale 1 puff into the lungs 2 (two) times daily., Disp: 1 Inhaler, Rfl: 0 .  DULoxetine (CYMBALTA) 20 MG capsule, Take 2 capsules (40 mg total) by mouth daily., Disp: 180 capsule, Rfl: 3 .  Erenumab-aooe (AIMOVIG) 140 MG/ML SOAJ, Inject 140 mg into the skin every 28 (twenty-eight) days., Disp: , Rfl:  .  gabapentin (NEURONTIN) 300 MG capsule, Take 1 capsule (300 mg total) by mouth at bedtime., Disp: 90 capsule, Rfl: 3 .  HYDROcodone-acetaminophen (NORCO/VICODIN) 5-325 MG tablet, Take 1 tablet by mouth every 6 (six) hours as needed. For pain, Disp: 30 tablet, Rfl: 0 .  linaclotide (LINZESS) 290 MCG CAPS capsule, Take 1 capsule (290 mcg total) by mouth daily before breakfast., Disp: 90 capsule, Rfl: 1 .  lipase/protease/amylase (CREON) 36000 UNITS CPEP capsule, Take 2 caps with each meal and 1 with a snack, Disp: 210 capsule, Rfl: 3 .  loratadine (CLARITIN) 10 MG tablet, Take 10 mg by mouth 2 (two)  times daily., Disp: , Rfl:  .  mirtazapine (REMERON) 45 MG tablet, Take 1 tablet (45 mg total) by mouth at bedtime., Disp: 90 tablet, Rfl: 1 .  Multiple Vitamin (MULTIVITAMIN) tablet, Take 1 tablet by mouth daily., Disp: , Rfl:  .  pantoprazole (PROTONIX) 40 MG tablet, TAKE 1 TABLET BY MOUTH ONCE DAILY, Disp: 90 tablet, Rfl: 1 .  Probiotic Product (PROBIOTIC-10) CHEW, Chew 2 tablets by mouth daily., Disp: , Rfl:  .  promethazine (PHENERGAN) 25 MG tablet, Take  1 tablet (25 mg total) by mouth every 8 (eight) hours as needed for nausea or vomiting., Disp: 30 tablet, Rfl: 0 .  QUEtiapine (SEROQUEL) 25 MG tablet, Take 3 tablets (75 mg total) by mouth at bedtime., Disp: 270 tablet, Rfl: 3 .  Rimegepant Sulfate (NURTEC) 75 MG TBDP, Take 75 mg by mouth every other day as needed (migraine)., Disp: 15 tablet, Rfl: 2 .  simvastatin (ZOCOR) 20 MG tablet, Take 1 tablet (20 mg total) by mouth at bedtime., Disp: 90 tablet, Rfl: 3 .  Specialty Vitamins Products (BIOTIN PLUS KERATIN) 10000-100 MCG-MG TABS, Take 1 tablet by mouth daily., Disp: , Rfl:  .  traZODone (DESYREL) 100 MG tablet, Take 1 tablet (100 mg total) by mouth at bedtime., Disp: 90 tablet, Rfl: 3  Review of Systems  Constitutional: Negative.   HENT: Positive for rhinorrhea, sinus pressure and sinus pain.   Respiratory: Positive for cough.   Cardiovascular: Negative.   Musculoskeletal: Negative.     Social History   Tobacco Use  . Smoking status: Current Every Day Smoker    Packs/day: 0.50    Years: 30.00    Pack years: 15.00    Types: Cigarettes  . Smokeless tobacco: Never Used  Substance Use Topics  . Alcohol use: Not Currently    Alcohol/week: 0.0 standard drinks      Objective:   There were no vitals taken for this visit. There were no vitals filed for this visit.There is no height or weight on file to calculate BMI.   Physical Exam Speaking in full sentences, NAD  No results found for any visits on 03/21/19.     Assessment & Plan    I discussed the assessment and treatment plan with the patient. The patient was provided an opportunity to ask questions and all were answered. The patient agreed with the plan and demonstrated an understanding of the instructions.   The patient was advised to call back or seek an in-person evaluation if the symptoms worsen or if the condition fails to improve as anticipated.  1. Suspected COVID-19 virus infection - symptoms c/w viral URI  -  doubt strep pharyngitis, CAP, bacterial sinusitis, or other bacterial infection - concerning for possible COVID19 infection - discussed need to self-isolate, along with all household members, for 10 days from start of illness and until fever free for at least 24 hours. - will send for outpatient COVID19 testing - discussed symptomatic management, natural course, and return precautions    Follow-up prn  The entirety of the information documented in the History of Present Illness, Review of Systems and Physical Exam were personally obtained by me. Portions of this information were initially documented by Tiburcio Pea, CMA and reviewed by me for thoroughness and accuracy.    Aqua Denslow, Dionne Bucy, MD MPH [facility]

## 2019-03-22 LAB — NOVEL CORONAVIRUS, NAA: SARS-CoV-2, NAA: NOT DETECTED

## 2019-03-23 ENCOUNTER — Ambulatory Visit (INDEPENDENT_AMBULATORY_CARE_PROVIDER_SITE_OTHER): Payer: BC Managed Care – PPO | Admitting: Physician Assistant

## 2019-03-23 ENCOUNTER — Encounter: Payer: Self-pay | Admitting: Physician Assistant

## 2019-03-23 ENCOUNTER — Other Ambulatory Visit: Payer: Self-pay

## 2019-03-23 VITALS — Wt 86.0 lb

## 2019-03-23 DIAGNOSIS — J0140 Acute pansinusitis, unspecified: Secondary | ICD-10-CM

## 2019-03-23 MED ORDER — AZITHROMYCIN 250 MG PO TABS: ORAL_TABLET | ORAL | 0 refills | Status: DC

## 2019-03-23 MED ORDER — PREDNISONE 10 MG PO TABS: ORAL_TABLET | ORAL | 0 refills | Status: DC

## 2019-03-23 NOTE — Patient Instructions (Signed)
Sinusitis, Adult Sinusitis is inflammation of your sinuses. Sinuses are hollow spaces in the bones around your face. Your sinuses are located:  Around your eyes.  In the middle of your forehead.  Behind your nose.  In your cheekbones. Mucus normally drains out of your sinuses. When your nasal tissues become inflamed or swollen, mucus can become trapped or blocked. This allows bacteria, viruses, and fungi to grow, which leads to infection. Most infections of the sinuses are caused by a virus. Sinusitis can develop quickly. It can last for up to 4 weeks (acute) or for more than 12 weeks (chronic). Sinusitis often develops after a cold. What are the causes? This condition is caused by anything that creates swelling in the sinuses or stops mucus from draining. This includes:  Allergies.  Asthma.  Infection from bacteria or viruses.  Deformities or blockages in your nose or sinuses.  Abnormal growths in the nose (nasal polyps).  Pollutants, such as chemicals or irritants in the air.  Infection from fungi (rare). What increases the risk? You are more likely to develop this condition if you:  Have a weak body defense system (immune system).  Do a lot of swimming or diving.  Overuse nasal sprays.  Smoke. What are the signs or symptoms? The main symptoms of this condition are pain and a feeling of pressure around the affected sinuses. Other symptoms include:  Stuffy nose or congestion.  Thick drainage from your nose.  Swelling and warmth over the affected sinuses.  Headache.  Upper toothache.  A cough that may get worse at night.  Extra mucus that collects in the throat or the back of the nose (postnasal drip).  Decreased sense of smell and taste.  Fatigue.  A fever.  Sore throat.  Bad breath. How is this diagnosed? This condition is diagnosed based on:  Your symptoms.  Your medical history.  A physical exam.  Tests to find out if your condition is  acute or chronic. This may include: ? Checking your nose for nasal polyps. ? Viewing your sinuses using a device that has a light (endoscope). ? Testing for allergies or bacteria. ? Imaging tests, such as an MRI or CT scan. In rare cases, a bone biopsy may be done to rule out more serious types of fungal sinus disease. How is this treated? Treatment for sinusitis depends on the cause and whether your condition is chronic or acute.  If caused by a virus, your symptoms should go away on their own within 10 days. You may be given medicines to relieve symptoms. They include: ? Medicines that shrink swollen nasal passages (topical intranasal decongestants). ? Medicines that treat allergies (antihistamines). ? A spray that eases inflammation of the nostrils (topical intranasal corticosteroids). ? Rinses that help get rid of thick mucus in your nose (nasal saline washes).  If caused by bacteria, your health care provider may recommend waiting to see if your symptoms improve. Most bacterial infections will get better without antibiotic medicine. You may be given antibiotics if you have: ? A severe infection. ? A weak immune system.  If caused by narrow nasal passages or nasal polyps, you may need to have surgery. Follow these instructions at home: Medicines  Take, use, or apply over-the-counter and prescription medicines only as told by your health care provider. These may include nasal sprays.  If you were prescribed an antibiotic medicine, take it as told by your health care provider. Do not stop taking the antibiotic even if you start   to feel better. Hydrate and humidify   Drink enough fluid to keep your urine pale yellow. Staying hydrated will help to thin your mucus.  Use a cool mist humidifier to keep the humidity level in your home above 50%.  Inhale steam for 10-15 minutes, 3-4 times a day, or as told by your health care provider. You can do this in the bathroom while a hot shower is  running.  Limit your exposure to cool or dry air. Rest  Rest as much as possible.  Sleep with your head raised (elevated).  Make sure you get enough sleep each night. General instructions   Apply a warm, moist washcloth to your face 3-4 times a day or as told by your health care provider. This will help with discomfort.  Wash your hands often with soap and water to reduce your exposure to germs. If soap and water are not available, use hand sanitizer.  Do not smoke. Avoid being around people who are smoking (secondhand smoke).  Keep all follow-up visits as told by your health care provider. This is important. Contact a health care provider if:  You have a fever.  Your symptoms get worse.  Your symptoms do not improve within 10 days. Get help right away if:  You have a severe headache.  You have persistent vomiting.  You have severe pain or swelling around your face or eyes.  You have vision problems.  You develop confusion.  Your neck is stiff.  You have trouble breathing. Summary  Sinusitis is soreness and inflammation of your sinuses. Sinuses are hollow spaces in the bones around your face.  This condition is caused by nasal tissues that become inflamed or swollen. The swelling traps or blocks the flow of mucus. This allows bacteria, viruses, and fungi to grow, which leads to infection.  If you were prescribed an antibiotic medicine, take it as told by your health care provider. Do not stop taking the antibiotic even if you start to feel better.  Keep all follow-up visits as told by your health care provider. This is important. This information is not intended to replace advice given to you by your health care provider. Make sure you discuss any questions you have with your health care provider. Document Revised: 06/20/2017 Document Reviewed: 06/20/2017 Elsevier Patient Education  2020 [employer]  

## 2019-03-23 NOTE — Progress Notes (Signed)
[name] Female    DOB: 1955-12-25   64 y.o.   MRN: [id number] Visit Date: 03/23/2019  Today's Provider: Mar Daring, PA-C   Chief Complaint  Patient presents with  . URI   Subjective:    Virtual Visit via Telephone Note  I connected with Jordan Ortega on 03/23/19 at 11:20 AM EST by telephone and verified that I am speaking with the correct person using two identifiers.  Location: Patient: Home Provider: [facility]   I discussed the limitations, risks, security and privacy concerns of performing an evaluation and management service by telephone and the availability of in person appointments. I also discussed with the patient that there may be a patient responsible charge related to this service. The patient expressed understanding and agreed to proceed.   URI  This is a new problem. The current episode started in the past 7 days (Monday). The problem has been gradually worsening. Associated symptoms include congestion, coughing, headaches, rhinorrhea, sinus pain, sneezing and a sore throat. Pertinent negatives include no abdominal pain, chest pain, diarrhea, ear pain, nausea, vomiting or wheezing. She has tried decongestant and increased fluids (Dayquil and nightquil) for the symptoms. The treatment provided no relief.   She tested negative for COVID-19 2 days ago.  Allergies  Allergen Reactions  . Diazepam Other (See Comments)    Other reaction(s): Hallucination Patient states "it makes me crazy" REACTION: Behavioral problems  . Ciprofloxacin Other (See Comments)    Other reaction(s): Unknown Loopy  . Codeine Hives and Itching    REACTION: Dizziness  . Metoprolol     Migraine/headaches   . Morphine Hives    Other reaction(s): Unknown  . Niacin And Related Other (See Comments)    flushing  . Topiramate Other (See Comments)  . Toradol [Ketorolac Tromethamine] Itching    Tolerates when taken with Benadryl  . Doxycycline Diarrhea      Current Outpatient Medications:  .  albuterol (PROVENTIL HFA;VENTOLIN HFA) 108 (90 Base) MCG/ACT inhaler, Inhale 2 puffs into the lungs every 6 (six) hours as needed for wheezing or shortness of breath., Disp: 1 Inhaler, Rfl: 2 .  ALPRAZolam (XANAX) 0.5 MG tablet, Take 1 tablet (0.5 mg total) by mouth 3 (three) times daily as needed for anxiety., Disp: 90 tablet, Rfl: 0 .  Ascorbic Acid (VITAMIN C) 1000 MG tablet, Take 1,000 mg by mouth daily., Disp: , Rfl:  .  baclofen (LIORESAL) 10 MG tablet, Take 1 tablet (10 mg total) by mouth 3 (three) times daily., Disp: 270 tablet, Rfl: 3 .  budesonide-formoterol (SYMBICORT) 160-4.5 MCG/ACT inhaler, Inhale 1 puff into the lungs 2 (two) times daily., Disp: 1 Inhaler, Rfl: 0 .  DULoxetine (CYMBALTA) 20 MG capsule, Take 2 capsules (40 mg total) by mouth daily., Disp: 180 capsule, Rfl: 3 .  Erenumab-aooe (AIMOVIG) 140 MG/ML SOAJ, Inject 140 mg into the skin every 28 (twenty-eight) days., Disp: , Rfl:  .  gabapentin (NEURONTIN) 300 MG capsule, Take 1 capsule (300 mg total) by mouth at bedtime., Disp: 90 capsule, Rfl: 3 .  HYDROcodone-acetaminophen (NORCO/VICODIN) 5-325 MG tablet, Take 1 tablet by mouth every 6 (six) hours as needed. For pain, Disp: 30 tablet, Rfl: 0 .  linaclotide (LINZESS) 290 MCG CAPS capsule, Take 1 capsule (290 mcg total) by mouth daily before breakfast., Disp: 90 capsule, Rfl: 1 .  lipase/protease/amylase (CREON) 36000 UNITS CPEP capsule, Take 2 caps with each meal and 1 with a snack, Disp: 210 capsule,  Rfl: 3 .  loratadine (CLARITIN) 10 MG tablet, Take 10 mg by mouth 2 (two) times daily., Disp: , Rfl:  .  mirtazapine (REMERON) 45 MG tablet, Take 1 tablet (45 mg total) by mouth at bedtime., Disp: 90 tablet, Rfl: 1 .  Multiple Vitamin (MULTIVITAMIN) tablet, Take 1 tablet by mouth daily., Disp: , Rfl:  .  pantoprazole (PROTONIX) 40 MG tablet, TAKE 1 TABLET BY MOUTH ONCE DAILY, Disp: 90 tablet, Rfl: 1 .  Probiotic Product (PROBIOTIC-10)  CHEW, Chew 2 tablets by mouth daily., Disp: , Rfl:  .  promethazine (PHENERGAN) 25 MG tablet, Take 1 tablet (25 mg total) by mouth every 8 (eight) hours as needed for nausea or vomiting., Disp: 30 tablet, Rfl: 0 .  QUEtiapine (SEROQUEL) 25 MG tablet, Take 3 tablets (75 mg total) by mouth at bedtime., Disp: 270 tablet, Rfl: 3 .  Rimegepant Sulfate (NURTEC) 75 MG TBDP, Take 75 mg by mouth every other day as needed (migraine)., Disp: 15 tablet, Rfl: 2 .  simvastatin (ZOCOR) 20 MG tablet, Take 1 tablet (20 mg total) by mouth at bedtime., Disp: 90 tablet, Rfl: 3 .  Specialty Vitamins Products (BIOTIN PLUS KERATIN) 10000-100 MCG-MG TABS, Take 1 tablet by mouth daily., Disp: , Rfl:  .  traZODone (DESYREL) 100 MG tablet, Take 1 tablet (100 mg total) by mouth at bedtime., Disp: 90 tablet, Rfl: 3  Review of Systems  Constitutional: Positive for chills and fatigue. Negative for fever.  HENT: Positive for congestion, postnasal drip, rhinorrhea, sinus pressure, sinus pain, sneezing and sore throat. Negative for ear pain and trouble swallowing.   Respiratory: Positive for cough and chest tightness. Negative for shortness of breath and wheezing.   Cardiovascular: Negative for chest pain, palpitations and leg swelling.  Gastrointestinal: Negative for abdominal pain, diarrhea, nausea and vomiting.  Neurological: Positive for headaches. Negative for dizziness and light-headedness.    Social History   Tobacco Use  . Smoking status: Current Every Day Smoker    Packs/day: 0.50    Years: 30.00    Pack years: 15.00    Types: Cigarettes  . Smokeless tobacco: Never Used  Substance Use Topics  . Alcohol use: Not Currently    Alcohol/week: 0.0 standard drinks      Objective:   Wt 86 lb (39 kg)   BMI 16.80 kg/m  Vitals:   03/23/19 0912  Weight: 86 lb (39 kg)  Body mass index is 16.8 kg/m.   Physical Exam Vitals reviewed.  Constitutional:      General: She is not in acute distress. Pulmonary:      Effort: No respiratory distress.  Neurological:     Mental Status: She is alert.      No results found for any visits on 03/23/19.     Assessment & Plan     1. Acute non-recurrent pansinusitis Worsening symptoms that have not responded to OTC medications. Will give azithromycin and prednisone as below. Continue allergy medications. Stay well hydrated and get plenty of rest. Call if no symptom improvement or if symptoms worsen. - azithromycin (ZITHROMAX) 250 MG tablet; Take 2 tablets PO on day one, and one tablet PO daily thereafter until completed.  Dispense: 6 tablet; Refill: 0 - predniSONE (DELTASONE) 10 MG tablet; Take 6 tablets PO on day 1 and day 2, take 5 tablets PO on day 3 and day 4, take 4 tablets PO on day 5 and day 6, take 3 tablets PO on day 7 and day 8, take 2 tablets  PO on day 9 and day 10, take one tablet PO on day 11 and day 12.  Dispense: 42 tablet; Refill: 0   I discussed the assessment and treatment plan with the patient. The patient was provided an opportunity to ask questions and all were answered. The patient agreed with the plan and demonstrated an understanding of the instructions.   The patient was advised to call back or seek an in-person evaluation if the symptoms worsen or if the condition fails to improve as anticipated.  I provided 8 minutes of non-face-to-face time during this encounter.    Mar Daring, PA-C  [facility]

## 2019-04-04 ENCOUNTER — Encounter: Payer: Self-pay | Admitting: Physician Assistant

## 2019-04-04 DIAGNOSIS — J0140 Acute pansinusitis, unspecified: Secondary | ICD-10-CM

## 2019-04-05 MED ORDER — PREDNISONE 10 MG PO TABS: ORAL_TABLET | ORAL | 0 refills | Status: DC

## 2019-04-05 MED ORDER — AZITHROMYCIN 250 MG PO TABS: ORAL_TABLET | ORAL | 0 refills | Status: DC

## 2019-04-05 NOTE — Telephone Encounter (Signed)
Will forward to [place] as she saw patient for this

## 2019-04-05 NOTE — Addendum Note (Signed)
Addended by: Mar Daring on: 04/05/2019 01:31 PM   Modules accepted: Orders

## 2019-04-12 ENCOUNTER — Ambulatory Visit
Admission: RE | Admit: 2019-04-12 | Discharge: 2019-04-12 | Disposition: A | Discharge status: Home / Self Care | Payer: BC Managed Care – PPO | Source: Ambulatory Visit | Attending: Physician Assistant | Admitting: Physician Assistant

## 2019-04-12 ENCOUNTER — Ambulatory Visit
Admission: RE | Admit: 2019-04-12 | Discharge: 2019-04-12 | Disposition: A | Discharge status: Home / Self Care | Payer: BC Managed Care – PPO | Attending: Physician Assistant | Admitting: Physician Assistant

## 2019-04-12 ENCOUNTER — Other Ambulatory Visit: Payer: Self-pay

## 2019-04-12 ENCOUNTER — Encounter: Payer: Self-pay | Admitting: Physician Assistant

## 2019-04-12 ENCOUNTER — Ambulatory Visit: Payer: BC Managed Care – PPO | Admitting: Physician Assistant

## 2019-04-12 VITALS — BP 104/66 | HR 70 | Temp 97.3°F | Resp 16 | Wt 92.9 lb

## 2019-04-12 DIAGNOSIS — R059 Cough, unspecified: Secondary | ICD-10-CM

## 2019-04-12 DIAGNOSIS — R0781 Pleurodynia: Secondary | ICD-10-CM

## 2019-04-12 DIAGNOSIS — R05 Cough: Secondary | ICD-10-CM | POA: Diagnosis present

## 2019-04-12 DIAGNOSIS — J432 Centrilobular emphysema: Secondary | ICD-10-CM

## 2019-04-12 MED ORDER — MELOXICAM 7.5 MG PO TABS: 7.5000 mg | ORAL_TABLET | Freq: Every day | ORAL | 0 refills | Status: DC

## 2019-04-12 NOTE — Progress Notes (Signed)
[name] Female    DOB: 08-Jan-1956   64 y.o.   MRN: [id number] Visit Date: 04/12/2019  Today's Provider: Mar Daring, PA-C   Chief Complaint  Patient presents with  . Chest Pain   Subjective:     HPI   Patient here with c/o possible rib fracture, right side. Reports that 2(two) days ago her husband hugged her and lifted her and she felt a really sharp pain and she heard it pop. Reports that it hurts to breath and with movements. She has taken tylenol.  Allergies  Allergen Reactions  . Diazepam Other (See Comments)    Other reaction(s): Hallucination Patient states "it makes me crazy" REACTION: Behavioral problems  . Ciprofloxacin Other (See Comments)    Other reaction(s): Unknown Loopy  . Codeine Hives and Itching    REACTION: Dizziness  . Metoprolol     Migraine/headaches   . Morphine Hives    Other reaction(s): Unknown  . Niacin And Related Other (See Comments)    flushing  . Topiramate Other (See Comments)  . Toradol [Ketorolac Tromethamine] Itching    Tolerates when taken with Benadryl  . Doxycycline Diarrhea     Current Outpatient Medications:  .  albuterol (PROVENTIL HFA;VENTOLIN HFA) 108 (90 Base) MCG/ACT inhaler, Inhale 2 puffs into the lungs every 6 (six) hours as needed for wheezing or shortness of breath., Disp: 1 Inhaler, Rfl: 2 .  ALPRAZolam (XANAX) 0.5 MG tablet, Take 1 tablet (0.5 mg total) by mouth 3 (three) times daily as needed for anxiety., Disp: 90 tablet, Rfl: 0 .  Ascorbic Acid (VITAMIN C) 1000 MG tablet, Take 1,000 mg by mouth daily., Disp: , Rfl:  .  baclofen (LIORESAL) 10 MG tablet, Take 1 tablet (10 mg total) by mouth 3 (three) times daily., Disp: 270 tablet, Rfl: 3 .  budesonide-formoterol (SYMBICORT) 160-4.5 MCG/ACT inhaler, Inhale 1 puff into the lungs 2 (two) times daily., Disp: 1 Inhaler, Rfl: 0 .  DULoxetine (CYMBALTA) 20 MG capsule, Take 2 capsules (40 mg total) by mouth daily., Disp: 180 capsule, Rfl:  3 .  Erenumab-aooe (AIMOVIG) 140 MG/ML SOAJ, Inject 140 mg into the skin every 28 (twenty-eight) days., Disp: , Rfl:  .  gabapentin (NEURONTIN) 300 MG capsule, Take 1 capsule (300 mg total) by mouth at bedtime., Disp: 90 capsule, Rfl: 3 .  HYDROcodone-acetaminophen (NORCO/VICODIN) 5-325 MG tablet, Take 1 tablet by mouth every 6 (six) hours as needed. For pain, Disp: 30 tablet, Rfl: 0 .  linaclotide (LINZESS) 290 MCG CAPS capsule, Take 1 capsule (290 mcg total) by mouth daily before breakfast., Disp: 90 capsule, Rfl: 1 .  lipase/protease/amylase (CREON) 36000 UNITS CPEP capsule, Take 2 caps with each meal and 1 with a snack, Disp: 210 capsule, Rfl: 3 .  loratadine (CLARITIN) 10 MG tablet, Take 10 mg by mouth 2 (two) times daily., Disp: , Rfl:  .  mirtazapine (REMERON) 45 MG tablet, Take 1 tablet (45 mg total) by mouth at bedtime., Disp: 90 tablet, Rfl: 1 .  Multiple Vitamin (MULTIVITAMIN) tablet, Take 1 tablet by mouth daily., Disp: , Rfl:  .  pantoprazole (PROTONIX) 40 MG tablet, TAKE 1 TABLET BY MOUTH ONCE DAILY, Disp: 90 tablet, Rfl: 1 .  predniSONE (DELTASONE) 10 MG tablet, Take 6 tablets PO on day 1 and day 2, take 5 tablets PO on day 3 and day 4, take 4 tablets PO on day 5 and day 6, take 3 tablets PO on day 7  and day 8, take 2 tablets PO on day 9 and day 10, take one tablet PO on day 11 and day 12., Disp: 42 tablet, Rfl: 0 .  Probiotic Product (PROBIOTIC-10) CHEW, Chew 2 tablets by mouth daily., Disp: , Rfl:  .  promethazine (PHENERGAN) 25 MG tablet, Take 1 tablet (25 mg total) by mouth every 8 (eight) hours as needed for nausea or vomiting., Disp: 30 tablet, Rfl: 0 .  QUEtiapine (SEROQUEL) 25 MG tablet, Take 3 tablets (75 mg total) by mouth at bedtime., Disp: 270 tablet, Rfl: 3 .  Rimegepant Sulfate (NURTEC) 75 MG TBDP, Take 75 mg by mouth every other day as needed (migraine)., Disp: 15 tablet, Rfl: 2 .  simvastatin (ZOCOR) 20 MG tablet, Take 1 tablet (20 mg total) by mouth at bedtime., Disp:  90 tablet, Rfl: 3 .  Specialty Vitamins Products (BIOTIN PLUS KERATIN) 10000-100 MCG-MG TABS, Take 1 tablet by mouth daily., Disp: , Rfl:  .  traZODone (DESYREL) 100 MG tablet, Take 1 tablet (100 mg total) by mouth at bedtime., Disp: 90 tablet, Rfl: 3  Review of Systems  Constitutional: Negative.   Respiratory: Positive for cough.   Cardiovascular: Positive for chest pain.  Gastrointestinal: Negative.   Neurological: Negative.     Social History   Tobacco Use  . Smoking status: Current Every Day Smoker    Packs/day: 0.50    Years: 30.00    Pack years: 15.00    Types: Cigarettes  . Smokeless tobacco: Never Used  Substance Use Topics  . Alcohol use: Not Currently    Alcohol/week: 0.0 standard drinks      Objective:   BP 104/66 (BP Location: Left Arm, Patient Position: Sitting, Cuff Size: Normal)   Pulse 70   Temp (!) 97.3 F (36.3 C) (Temporal)   Resp 16   Wt 92 lb 14.4 oz (42.1 kg)   BMI 18.14 kg/m  Vitals:   04/12/19 1404  BP: 104/66  Pulse: 70  Resp: 16  Temp: (!) 97.3 F (36.3 C)  TempSrc: Temporal  Weight: 92 lb 14.4 oz (42.1 kg)  Body mass index is 18.14 kg/m.   Physical Exam Vitals reviewed.  Constitutional:      General: She is not in acute distress.    Appearance: Normal appearance. She is well-developed and underweight. She is not ill-appearing or diaphoretic.  Neck:     Thyroid: No thyromegaly.     Vascular: No JVD.     Trachea: No tracheal deviation.  Cardiovascular:     Rate and Rhythm: Normal rate and regular rhythm.     Heart sounds: Normal heart sounds. No murmur. No friction rub. No gallop.   Pulmonary:     Effort: Pulmonary effort is normal. No respiratory distress.     Breath sounds: Normal breath sounds. No decreased breath sounds, wheezing, rhonchi or rales.  Chest:     Chest wall: Tenderness present. No mass, deformity, crepitus or edema.    Musculoskeletal:     Cervical back: Normal range of motion and neck supple.    Lymphadenopathy:     Cervical: No cervical adenopathy.  Neurological:     Mental Status: She is alert.  Psychiatric:        Behavior: Behavior is cooperative.      No results found for any visits on 04/12/19.     Assessment & Plan    1. Rib pain on right side Will get imaging as below to r/o fracture or displacement. Start meloxicam as  below. May add tylenol prn. Moist heating pad. Deep breathing exercises demonstrated to patient in room. I will f/u pending imaging results.  - DG Ribs Unilateral Right; Future - meloxicam (MOBIC) 7.5 MG tablet; Take 1 tablet (7.5 mg total) by mouth daily.  Dispense: 30 tablet; Refill: 0  2. Cough Continues to have nighttime cough. Advised to start symbicort at least before bedtime, reports she has not been using.  - DG Chest 2 View; Future  3. Centrilobular emphysema ([place]) Possibly source of continued cough.      Mar Daring, PA-C  [facility]

## 2019-04-12 NOTE — Patient Instructions (Signed)
Rib Contusion A rib contusion is a deep bruise on your rib area. Contusions are the result of a blunt trauma that causes bleeding and injury to the tissues under the skin. A rib contusion may involve bruising of the ribs and of the skin and muscles in the area. The skin over the contusion may turn blue, purple, or yellow. Minor injuries will give you a painless contusion. More severe contusions may stay painful and swollen for a few weeks. What are the causes? This condition is usually caused by a blow, trauma, or direct force to an area of the body. This often occurs while playing contact sports. What are the signs or symptoms? Symptoms of this condition include:  Swelling and redness of the injured area.  Discoloration of the injured area.  Tenderness and soreness of the injured area.  Pain with or without movement. How is this diagnosed? This condition may be diagnosed based on:  Your symptoms and medical history.  A physical exam.  Imaging tests--such as an X-ray, CT scan, or MRI--to determine if there were internal injuries or broken bones (fractures). How is this treated? This condition may be treated with:  Rest. This is often the best treatment for a rib contusion.  Icing. This reduces swelling and inflammation.  Deep-breathing exercises. These may be recommended to reduce the risk for lung collapse and pneumonia.  Medicines. Over-the-counter or prescription medicines may be given to control pain.  Injection of a numbing medicine around the nerve near your injury (nerve block). Follow these instructions at home:     Medicines  Take over-the-counter and prescription medicines only as told by your health care provider.  Do not drive or use heavy machinery while taking prescription pain medicine.  If you are taking prescription pain medicine, take actions to prevent or treat constipation. Your health care provider may recommend that you: ? Drink enough fluid to keep  your urine pale yellow. ? Eat foods that are high in fiber, such as fresh fruits and vegetables, whole grains, and beans. ? Limit foods that are high in fat and processed sugars, such as fried or sweet foods. ? Take an over-the-counter or prescription medicine for constipation. Managing pain, stiffness, and swelling  If directed, put ice on the injured area: ? Put ice in a plastic bag. ? Place a towel between your skin and the bag. ? Leave the ice on for 20 minutes, 2-3 times a day.  Rest the injured area. Avoid strenuous activity and any activities or movements that cause pain. Be careful during activities and avoid bumping the injured area.  Do not lift anything that is heavier than 5 lb (2.3 kg), or the limit that you are told, until your health care provider says that it is safe. General instructions  Do not use any products that contain nicotine or tobacco, such as cigarettes and e-cigarettes. These can delay healing. If you need help quitting, ask your health care provider.  Do deep-breathing exercises as told by your health care provider.  If you were given an incentive spirometer, use it every 1-2 hours while you are awake, or as recommended by your health care provider. This device measures how well you are filling your lungs with each breath.  Keep all follow-up visits as told by your health care provider. This is important. Contact a health care provider if you have:  Increased bruising or swelling.  Pain that is not controlled with treatment.  A fever. Get help right away if   you:  Have difficulty breathing or shortness of breath.  Develop a continual cough or you cough up thick or bloody sputum.  Feel nauseous or you vomit.  Have pain in your abdomen. Summary  A rib contusion is a deep bruise on your rib area. Contusions are the result of a blunt trauma that causes bleeding and injury to the tissues under the skin.  The skin overlying the contusion may turn  blue, purple, or yellow. Minor injuries may give you a painless contusion. More severe contusions may stay painful and swollen for a few weeks.  Rest the injured area. Avoid strenuous activity and any activities or movements that cause pain. This information is not intended to replace advice given to you by your health care provider. Make sure you discuss any questions you have with your health care provider. Document Revised: 02/16/2017 Document Reviewed: 02/16/2017 Elsevier Patient Education  2020 [employer]  

## 2019-04-13 ENCOUNTER — Telehealth: Payer: Self-pay

## 2019-04-13 NOTE — Telephone Encounter (Signed)
She will have to wait 2 weeks after completing prednisone for the vaccine to be most effective for her.

## 2019-04-13 NOTE — Telephone Encounter (Signed)
Patient advised as directed below. Patient wants to know if she is ok to get the covid vaccine? That she still has 2 more days of the prednisone.

## 2019-04-13 NOTE — Telephone Encounter (Signed)
-----   Message from Mar Daring, [place] sent at 04/13/2019  8:43 AM EST ----- CXR is normal. No pneumonia or nodules that could be causing cough.

## 2019-04-13 NOTE — Telephone Encounter (Signed)
Patient advised as directed below. 

## 2019-04-16 ENCOUNTER — Other Ambulatory Visit: Payer: Self-pay | Admitting: Physician Assistant

## 2019-04-16 DIAGNOSIS — K2270 Barrett's esophagus without dysplasia: Secondary | ICD-10-CM

## 2019-05-13 ENCOUNTER — Encounter: Payer: Self-pay | Admitting: Physician Assistant

## 2019-05-13 DIAGNOSIS — M62838 Other muscle spasm: Secondary | ICD-10-CM

## 2019-05-13 DIAGNOSIS — G43411 Hemiplegic migraine, intractable, with status migrainosus: Secondary | ICD-10-CM

## 2019-05-14 MED ORDER — HYDROCODONE-ACETAMINOPHEN 5-325 MG PO TABS: 1.0000 | ORAL_TABLET | Freq: Four times a day (QID) | ORAL | 0 refills | Status: DC | PRN

## 2019-05-14 MED ORDER — CYCLOBENZAPRINE HCL 5 MG PO TABS: 5.0000 mg | ORAL_TABLET | Freq: Three times a day (TID) | ORAL | 1 refills | Status: DC | PRN

## 2019-05-16 ENCOUNTER — Telehealth: Payer: Self-pay | Admitting: Physician Assistant

## 2019-05-16 NOTE — Telephone Encounter (Signed)
Patient called stating that she was exposed to positive COVID-19 family member on Friday. She has COVID-19 vaccine scheduled tomorrow. Patient was advised that she should wait 14 day monitor for symptoms then have her vaccine.  She verbalized understanding. Her appointment was canceled and she states she will call back at the end of 14days to reschedule.

## 2019-05-17 ENCOUNTER — Ambulatory Visit: Payer: BC Managed Care – PPO

## 2019-06-07 ENCOUNTER — Encounter: Payer: Self-pay | Admitting: Dermatology

## 2019-06-07 ENCOUNTER — Other Ambulatory Visit: Payer: Self-pay

## 2019-06-07 ENCOUNTER — Ambulatory Visit (INDEPENDENT_AMBULATORY_CARE_PROVIDER_SITE_OTHER): Payer: BC Managed Care – PPO | Admitting: Dermatology

## 2019-06-07 DIAGNOSIS — L578 Other skin changes due to chronic exposure to nonionizing radiation: Secondary | ICD-10-CM

## 2019-06-07 DIAGNOSIS — S80869A Insect bite (nonvenomous), unspecified lower leg, initial encounter: Secondary | ICD-10-CM

## 2019-06-07 DIAGNOSIS — D229 Melanocytic nevi, unspecified: Secondary | ICD-10-CM | POA: Diagnosis not present

## 2019-06-07 DIAGNOSIS — D1801 Hemangioma of skin and subcutaneous tissue: Secondary | ICD-10-CM

## 2019-06-07 DIAGNOSIS — L814 Other melanin hyperpigmentation: Secondary | ICD-10-CM

## 2019-06-07 DIAGNOSIS — L821 Other seborrheic keratosis: Secondary | ICD-10-CM

## 2019-06-07 DIAGNOSIS — D485 Neoplasm of uncertain behavior of skin: Secondary | ICD-10-CM

## 2019-06-07 DIAGNOSIS — S80862A Insect bite (nonvenomous), left lower leg, initial encounter: Secondary | ICD-10-CM | POA: Diagnosis not present

## 2019-06-07 DIAGNOSIS — W57XXXA Bitten or stung by nonvenomous insect and other nonvenomous arthropods, initial encounter: Secondary | ICD-10-CM

## 2019-06-07 DIAGNOSIS — S90561A Insect bite (nonvenomous), right ankle, initial encounter: Secondary | ICD-10-CM

## 2019-06-07 DIAGNOSIS — Z1283 Encounter for screening for malignant neoplasm of skin: Secondary | ICD-10-CM

## 2019-06-07 NOTE — Patient Instructions (Addendum)

## 2019-06-07 NOTE — Progress Notes (Addendum)
Follow-Up Visit   Subjective  Jordan Ortega is a 64 y.o. female who presents for the following: Annual Exam.  Patient here today for TBSE. Nothing new or changing that patient is aware of, other than a mole on her abdomen that is darker. She had a couple of spider bites that are healing.  The following portions of the chart were reviewed this encounter and updated as appropriate:     Review of Systems:  No other skin or systemic complaints except as noted in HPI or Assessment and Plan.  Objective  Well appearing patient in no apparent distress; mood and affect are within normal limits.  A full examination was performed including scalp, head, eyes, ears, nose, lips, neck, chest, axillae, abdomen, back, buttocks, bilateral upper extremities, bilateral lower extremities, hands, feet, fingers, toes, fingernails, and toenails. All findings within normal limits unless otherwise noted below.  Objective  Left pretibia, right posterior ankle: Crusted healing papules  Objective  Right Lower Abdomen: 1mm medium dark brown macule with irregular pigment     Objective  Right Forehead: 52mm pink yellow papule     Objective  Right antecubitum: 65mm waxy tan macule  Spinal Lower Back: Light tan scaly macule   Assessment & Plan  Insect bite of lower leg with local reaction, initial encounter Left pretibia, right posterior ankle  Start antibiotic ointment and cover with band-aid  Neoplasm of uncertain behavior of skin (2) Right Lower Abdomen  Skin / nail biopsy Type of biopsy: tangential   Informed consent: discussed and consent obtained   Patient was prepped and draped in usual sterile fashion: Area prepped with alcohol. Anesthesia: the lesion was anesthetized in a standard fashion   Anesthetic:  1% lidocaine w/ epinephrine 1-100,000 buffered w/ 8.4% NaHCO3 Instrument used: flexible razor blade   Hemostasis achieved with: pressure, aluminum chloride and  electrodesiccation   Outcome: patient tolerated procedure well   Post-procedure details: wound care instructions given   Post-procedure details comment:  Ointment and small bandage applied  Specimen 1 - Surgical pathology Differential Diagnosis: Nevus r/o Dysplasia Check Margins: No 2mm medium dark brown macule with irregular pigment  Right Forehead  Skin / nail biopsy Type of biopsy: tangential   Informed consent: discussed and consent obtained   Patient was prepped and draped in usual sterile fashion: Area prepped with alcohol. Anesthesia: the lesion was anesthetized in a standard fashion   Anesthetic:  1% lidocaine w/ epinephrine 1-100,000 buffered w/ 8.4% NaHCO3 Instrument used: flexible razor blade   Hemostasis achieved with: pressure, aluminum chloride and electrodesiccation   Outcome: patient tolerated procedure well   Post-procedure details: wound care instructions given   Post-procedure details comment:  Ointment and small bandage applied  Specimen 2 - Surgical pathology Differential Diagnosis: Sebaceous Hyperplasia r/o BCC Check Margins: No 15mm pink yellow papule  Seborrheic keratosis (2) Right antecubitum; Spinal Lower Back  Benign, observe.     Lentigines - Scattered tan macules - Discussed due to sun exposure - Benign, observe - Call for any changes  Seborrheic Keratoses - Stuck-on, waxy, tan-brown papules and plaques  - Discussed benign etiology and prognosis. - Observe - Call for any changes  Melanocytic Nevi - Tan-brown and/or pink-flesh-colored symmetric macules and papules - Benign appearing on exam today - Observation - Call clinic for new or changing moles - Recommend daily use of broad spectrum spf 30+ sunscreen to sun-exposed areas.   Hemangiomas - Red papules - Discussed benign nature - Observe - Call for any  changes  Actinic Damage - diffuse scaly erythematous macules with underlying dyspigmentation - Recommend daily broad spectrum  sunscreen SPF 30+ to sun-exposed areas, reapply every 2 hours as needed.  - Call for new or changing lesions.  Skin cancer screening performed today.  Return in about 6 months (around 12/08/2019) for follow up.  Graciella Belton, RMA, am acting as scribe for Brendolyn Patty, MD .  Documentation: I have reviewed the above documentation for accuracy and completeness, and I agree with the above.  Brendolyn Patty MD

## 2019-06-08 DIAGNOSIS — D239 Other benign neoplasm of skin, unspecified: Secondary | ICD-10-CM | POA: Insufficient documentation

## 2019-06-08 HISTORY — DX: Other benign neoplasm of skin, unspecified: D23.9

## 2019-06-11 ENCOUNTER — Telehealth: Payer: Self-pay

## 2019-06-11 NOTE — Telephone Encounter (Signed)
Advised patient of biopsy results. 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

## 2019-06-12 ENCOUNTER — Telehealth: Payer: Self-pay

## 2019-06-12 NOTE — Telephone Encounter (Signed)
Patient called and informed of biopsy results, patient verbalized understanding.  

## 2019-06-18 ENCOUNTER — Encounter: Payer: Self-pay | Admitting: Physician Assistant

## 2019-06-18 NOTE — Progress Notes (Signed)
Established patient visit   [name]   DOB: 1955-04-04   64 y.o. Female  MRN: [id number] Visit Date: 06/19/2019  Today's healthcare provider: Lavon Paganini, MD   Chief Complaint  Patient presents with  . Back Pain    Started April 9th    Subjective    Back Pain This is a new problem. The current episode started more than 1 month ago. The problem is unchanged. The pain is present in the lumbar spine. The quality of the pain is described as aching and stabbing. The pain does not radiate. The symptoms are aggravated by bending, twisting and position. Associated symptoms include weakness. Pertinent negatives include no headaches. She has tried chiropractic manipulation, muscle relaxant and analgesics for the symptoms. The treatment provided mild relief.   Chiropractor has helped with most severe pain Worse with getting in and out of the car and twisting and bending makes it worse Avoiding lifting as it is painful No radiation of symptoms down either leg  Taking flexeril per Tawanna Sat - didn't feel like pain improved, but did help with knots.  She has finished prescription  States that many years ago when something similar happened, she was successful with chiripractor   Patient Active Problem List   Diagnosis Date Noted  . Dysplastic nevus 06/08/2019  . Abdominal pain 10/16/2018  . Abnormal CT scan, colon 10/16/2018  . Personal history of tobacco use, presenting hazards to health 10/03/2018  . Arm pain 12/05/2017  . Centrilobular emphysema ([place]) 12/04/2017  . SOB (shortness of breath) 03/21/2017  . Premature ventricular contractions 10/21/2016  . Heart palpitations 10/05/2016  . Cyst of right ovary 08/18/2016  . Adnexal mass 08/17/2016  . Bilateral carpal tunnel syndrome 04/08/2016  . Bilateral hand pain 04/08/2016  . 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 Acute onset aura migraine 07/10/2014  . Bad memory 07/10/2014  . Migraine without aura 07/10/2014  .  Abdominal pain, epigastric 07/10/2014  . Chronic constipation 07/10/2014  . Weakness of left side of body 11/28/2011  . Acute anxiety 11/20/2009  . Anxiety and depression 11/20/2009  . Edema of extremities 11/20/2009  . Hypercholesterolemia without hypertriglyceridemia 06/18/2009  . 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 Malaise and fatigue 10/29/2008  . Weight loss, unintentional 10/29/2008  . Mixed hyperlipidemia 08/22/2008  . 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 Affective bipolar disorder ([place]) 05/17/2008  . Barrett esophagus 05/17/2008   Past Surgical History:  Procedure Laterality Date  . ABDOMINAL HYSTERECTOMY    . APPENDECTOMY    . CARDIAC CATHETERIZATION     [facility] pt doesn't have any stents or recall years ago 20+ yrs ago  . CHOLECYSTECTOMY    . COLONOSCOPY WITH PROPOFOL N/A 07/16/2016   Procedure: COLONOSCOPY WITH PROPOFOL;  Surgeon: [name], MD;  Location: [facility] ENDOSCOPY;  Service: Endoscopy;  Laterality: N/A;  . COLONOSCOPY WITH PROPOFOL N/A 10/20/2018   Procedure: COLONOSCOPY WITH PROPOFOL;  Surgeon: [name], MD;  Location: [facility] ENDOSCOPY;  Service: Gastroenterology;  Laterality: N/A;  . DIAGNOSTIC LAPAROSCOPY    . ESOPHAGOGASTRODUODENOSCOPY (EGD) WITH PROPOFOL N/A 07/16/2016   Procedure: ESOPHAGOGASTRODUODENOSCOPY (EGD) WITH PROPOFOL;  Surgeon: [name], MD;  Location[facility] ENDOSCOPY;  Service: Endoscopy;  Laterality: N/A;  . ESOPHAGOGASTRODUODENOSCOPY (EGD) WITH PROPOFOL N/A 12/05/2018   Procedure: ESOPHAGOGASTRODUODENOSCOPY (EGD) WITH PROPOFOL;  Surgeon: [name], MD;  Location: [facility] ENDOSCOPY;  Service: Gastroenterology;  Laterality: N/A;  . [place]  . GIVENS CAPSULE STUDY N/A 12/21/2018   Procedure:  GIVENS CAPSULE STUDY;  Surgeon: [name], MD;  Location: [facility] ENDOSCOPY;  Service: Gastroenterology;  Laterality: N/A;  . LAPAROSCOPIC SALPINGO OOPHERECTOMY Bilateral 08/27/2016   Procedure: LAPAROSCOPIC SALPINGO OOPHORECTOMY;   Surgeon: Gae Dry, MD;  Location: [facility] ORS;  Service: Gynecology;  Laterality: Bilateral;  . TUBAL LIGATION  1983   Social History   Tobacco Use  . Smoking status: Current Every Day Smoker    Packs/day: 0.50    Years: 30.00    Pack years: 15.00    Types: Cigarettes  . Smokeless tobacco: Never Used  Substance Use Topics  . Alcohol use: Not Currently    Alcohol/week: 0.0 standard drinks  . Drug use: No   Allergies  Allergen Reactions  . Diazepam Other (See Comments)    Other reaction(s): Hallucination Patient states "it makes me crazy" REACTION: Behavioral problems  . Ciprofloxacin Other (See Comments)    Other reaction(s): Unknown Loopy  . Codeine Hives and Itching    REACTION: Dizziness  . Metoprolol     Migraine/headaches   . Morphine Hives    Other reaction(s): Unknown  . Niacin And Related Other (See Comments)    flushing  . Topiramate Other (See Comments)  . Toradol [Ketorolac Tromethamine] Itching    Tolerates when taken with Benadryl  . Doxycycline Diarrhea     Medications: Outpatient Medications Prior to Visit  Medication Sig  . albuterol (PROVENTIL HFA;VENTOLIN HFA) 108 (90 Base) MCG/ACT inhaler Inhale 2 puffs into the lungs every 6 (six) hours as needed for wheezing or shortness of breath.  . ALPRAZolam (XANAX) 0.5 MG tablet Take 1 tablet (0.5 mg total) by mouth 3 (three) times daily as needed for anxiety.  . Ascorbic Acid (VITAMIN C) 1000 MG tablet Take 1,000 mg by mouth daily.  . baclofen (LIORESAL) 10 MG tablet Take 1 tablet (10 mg total) by mouth 3 (three) times daily.  . budesonide-formoterol (SYMBICORT) 160-4.5 MCG/ACT inhaler Inhale 1 puff into the lungs 2 (two) times daily.  . DULoxetine (CYMBALTA) 20 MG capsule Take 2 capsules (40 mg total) by mouth daily.  Marland Kitchen gabapentin (NEURONTIN) 300 MG capsule Take 1 capsule (300 mg total) by mouth at bedtime.  Marland Kitchen linaclotide (LINZESS) 290 MCG CAPS capsule Take 1 capsule (290 mcg total) by mouth daily  before breakfast.  . loratadine (CLARITIN) 10 MG tablet Take 10 mg by mouth 2 (two) times daily.  . mirtazapine (REMERON) 45 MG tablet Take 1 tablet (45 mg total) by mouth at bedtime.  . Multiple Vitamin (MULTIVITAMIN) tablet Take 1 tablet by mouth daily.  . pantoprazole (PROTONIX) 40 MG tablet TAKE 1 TABLET BY MOUTH ONCE A DAY  . Probiotic Product (PROBIOTIC-10) CHEW Chew 2 tablets by mouth daily.  . promethazine (PHENERGAN) 25 MG tablet Take 1 tablet (25 mg total) by mouth every 8 (eight) hours as needed for nausea or vomiting.  Marland Kitchen QUEtiapine (SEROQUEL) 25 MG tablet Take 3 tablets (75 mg total) by mouth at bedtime.  . Rimegepant Sulfate (NURTEC) 75 MG TBDP Take 75 mg by mouth every other day as needed (migraine).  . simvastatin (ZOCOR) 20 MG tablet Take 1 tablet (20 mg total) by mouth at bedtime.  Marland Kitchen Specialty Vitamins Products (BIOTIN PLUS KERATIN) 10000-100 MCG-MG TABS Take 1 tablet by mouth daily.  . traZODone (DESYREL) 100 MG tablet Take 1 tablet (100 mg total) by mouth at bedtime.  . [DISCONTINUED] HYDROcodone-acetaminophen (NORCO/VICODIN) 5-325 MG tablet Take 1 tablet by mouth every 6 (six) hours as needed. For pain  . [  DISCONTINUED] meloxicam (MOBIC) 7.5 MG tablet Take 1 tablet (7.5 mg total) by mouth daily.  Eduard Roux (AIMOVIG) 140 MG/ML SOAJ Inject 140 mg into the skin every 28 (twenty-eight) days.  Marland Kitchen lipase/protease/amylase (CREON) 36000 UNITS CPEP capsule Take 2 caps with each meal and 1 with a snack (Patient not taking: Reported on 06/19/2019)  . [DISCONTINUED] cyclobenzaprine (FLEXERIL) 5 MG tablet Take 1 tablet (5 mg total) by mouth 3 (three) times daily as needed for muscle spasms. (Patient not taking: Reported on 06/19/2019)  . [DISCONTINUED] predniSONE (DELTASONE) 10 MG tablet Take 6 tablets PO on day 1 and day 2, take 5 tablets PO on day 3 and day 4, take 4 tablets PO on day 5 and day 6, take 3 tablets PO on day 7 and day 8, take 2 tablets PO on day 9 and day 10, take one  tablet PO on day 11 and day 12.   No facility-administered medications prior to visit.    Review of Systems  Constitutional: Negative.   Respiratory: Negative.   Gastrointestinal: Negative.   Musculoskeletal: Positive for back pain. Negative for arthralgias, gait problem, joint swelling, myalgias, neck pain and neck stiffness.  Neurological: Positive for weakness. Negative for dizziness, light-headedness and headaches.    Last CBC Lab Results  Component Value Date   WBC 5.7 01/02/2019   HGB 13.3 01/02/2019   HCT 39.0 01/02/2019   MCV 99.5 01/02/2019   MCH 33.9 01/02/2019   RDW 12.0 01/02/2019   PLT 224 [id number]   Last metabolic panel Lab Results  Component Value Date   GLUCOSE 102 (H) 01/02/2019   NA 137 01/02/2019   K 4.4 01/02/2019   CL 104 01/02/2019   CO2 25 01/02/2019   BUN 17 01/02/2019   CREATININE 0.78 01/02/2019   GFRNONAA >60 01/02/2019   GFRAA >60 01/02/2019   CALCIUM 9.5 01/02/2019   PROT 7.4 01/02/2019   ALBUMIN 4.6 01/02/2019   LABGLOB 2.2 10/03/2018   AGRATIO 2.4 (H) 08/14/2018   BILITOT 0.6 01/02/2019   ALKPHOS 51 01/02/2019   AST 18 01/02/2019   ALT 17 01/02/2019   ANIONGAP 8 01/02/2019   Last lipids Lab Results  Component Value Date   CHOL 139 12/05/2017   HDL 59 12/05/2017   LDLCALC 67 12/05/2017   TRIG 67 12/05/2017   CHOLHDL 2.4 12/05/2017   Last hemoglobin A1c No results found for: HGBA1C Last thyroid functions Lab Results  Component Value Date   TSH 1.301 08/28/2018   Last vitamin D No results found for: 25OHVITD2, 25OHVITD3, VD25OH Last vitamin B12 and Folate No results found for: VITAMINB12, FOLATE  Objective    BP 110/74 (BP Location: Left Arm, Patient Position: Sitting, Cuff Size: Normal)   Pulse 67   Temp (!) 96.2 F (35.7 C) (Temporal)   Wt 94 lb (42.6 kg)   BMI 18.36 kg/m    Physical Exam Vitals reviewed.  Constitutional:      General: She is not in acute distress.    Appearance: Normal appearance. She  is well-developed. She is not diaphoretic.  HENT:     Head: Normocephalic and atraumatic.  Eyes:     General: No scleral icterus.    Conjunctiva/sclera: Conjunctivae normal.  Neck:     Thyroid: No thyromegaly.  Cardiovascular:     Rate and Rhythm: Normal rate and regular rhythm.     Pulses: Normal pulses.     Heart sounds: Normal heart sounds. No murmur.  Pulmonary:     Effort:  Pulmonary effort is normal. No respiratory distress.     Breath sounds: Normal breath sounds. No wheezing, rhonchi or rales.  Musculoskeletal:     Cervical back: Neck supple.     Right lower leg: No edema.     Left lower leg: No edema.     Comments: Back: TTP over midline and bilateral paraspinal musculature around L3-L4, ROM grossly intact. Negative SLR bilaterally.  Strength testing limited by pain Ssensation to light touch intact in lower extremities.  DTRs symmetric  Lymphadenopathy:     Cervical: No cervical adenopathy.  Skin:    General: Skin is warm and dry.     Findings: No rash.  Neurological:     Mental Status: She is alert and oriented to person, place, and time. Mental status is at baseline.  Psychiatric:        Mood and Affect: Mood normal.        Behavior: Behavior normal.      Reviewed Lumbar Xray that she brings from chiropractor that shows scoliosis with degenerative changes and no fracture.  - Reassured patient that bulging disc cannot be seen on XRay and she has no radicular symptoms  No results found for any visits on 06/19/19.  Assessment & Plan     1. Acute midline low back pain without sciatica 2. Muscle spasm Lumbar strain.  No radiculopathy Plan of rest, intermittent application of cold packs (later, may switch to heat, but do not sleep on heating pad), analgesics and muscle relaxants. Discussed longer term treatment plan of prn NSAID's and home back care exercise program with flexion exercise routine.  Proper lifting mechanics with avoidance of heavy lifting discussed.    Consider Physical Therapy if not improving.  Call or return to clinic prn if these symptoms worsen or fail to improve as anticipated.  Return immediately if worsening.   - can continue current meds as Rx'd by PCP as well   - cyclobenzaprine (FLEXERIL) 5 MG tablet; Take 1 tablet (5 mg total) by mouth 3 (three) times daily as needed for muscle spasms.  Dispense: 30 tablet; Refill: 1 - HYDROcodone-acetaminophen (NORCO/VICODIN) 5-325 MG tablet; Take 1 tablet by mouth every 6 (six) hours as needed for up to 5 days. For pain  Dispense: 20 tablet; Refill: 0 - meloxicam (MOBIC) 15 MG tablet; Take 0.5-1 tablets (7.5-15 mg total) by mouth daily.  Dispense: 30 tablet; Refill: 0    Return if symptoms worsen or fail to improve.      I, Lavon Paganini, MD, have reviewed all documentation for this visit. The documentation on 06/19/19 for the exam, diagnosis, procedures, and orders are all accurate and complete.   Pedram Goodchild, Dionne Bucy, MD, MPH [facility]

## 2019-06-18 NOTE — Telephone Encounter (Signed)
You cannot see a bulging disc on XRay.  We can not give out Jordan Ortega's cell phone number.  Maybe she needs to be reassessed.

## 2019-06-19 ENCOUNTER — Ambulatory Visit (INDEPENDENT_AMBULATORY_CARE_PROVIDER_SITE_OTHER): Payer: BC Managed Care – PPO | Admitting: Family Medicine

## 2019-06-19 ENCOUNTER — Other Ambulatory Visit: Payer: Self-pay

## 2019-06-19 ENCOUNTER — Encounter: Payer: Self-pay | Admitting: Family Medicine

## 2019-06-19 VITALS — BP 110/74 | HR 67 | Temp 96.2°F | Wt 94.0 lb

## 2019-06-19 DIAGNOSIS — M545 Low back pain, unspecified: Secondary | ICD-10-CM

## 2019-06-19 DIAGNOSIS — M62838 Other muscle spasm: Secondary | ICD-10-CM | POA: Diagnosis not present

## 2019-06-19 MED ORDER — MELOXICAM 15 MG PO TABS: 7.5000 mg | ORAL_TABLET | Freq: Every day | ORAL | 0 refills | Status: DC

## 2019-06-19 MED ORDER — CYCLOBENZAPRINE HCL 5 MG PO TABS: 5.0000 mg | ORAL_TABLET | Freq: Three times a day (TID) | ORAL | 1 refills | Status: DC | PRN

## 2019-06-19 MED ORDER — HYDROCODONE-ACETAMINOPHEN 5-325 MG PO TABS: 1.0000 | ORAL_TABLET | Freq: Four times a day (QID) | ORAL | 0 refills | Status: AC | PRN

## 2019-06-19 NOTE — Patient Instructions (Signed)
Acute Back Pain, Adult Acute back pain is sudden and usually short-lived. It is often caused by an injury to the muscles and tissues in the back. The injury may result from:  A muscle or ligament getting overstretched or torn (strained). Ligaments are tissues that connect bones to each other. Lifting something improperly can cause a back strain.  Wear and tear (degeneration) of the spinal disks. Spinal disks are circular tissue that provides cushioning between the bones of the spine (vertebrae).  Twisting motions, such as while playing sports or doing yard work.  A hit to the back.  Arthritis. You may have a physical exam, lab tests, and imaging tests to find the cause of your pain. Acute back pain usually goes away with rest and home care. Follow these instructions at home: Managing pain, stiffness, and swelling  Take over-the-counter and prescription medicines only as told by your health care provider.  Your health care provider may recommend applying ice during the first 24-48 hours after your pain starts. To do this: ? Put ice in a plastic bag. ? Place a towel between your skin and the bag. ? Leave the ice on for 20 minutes, 2-3 times a day.  If directed, apply heat to the affected area as often as told by your health care provider. Use the heat source that your health care provider recommends, such as a moist heat pack or a heating pad. ? Place a towel between your skin and the heat source. ? Leave the heat on for 20-30 minutes. ? Remove the heat if your skin turns bright red. This is especially important if you are unable to feel pain, heat, or cold. You have a greater risk of getting burned. Activity   Do not stay in bed. Staying in bed for more than 1-2 days can delay your recovery.  Sit up and stand up straight. Avoid leaning forward when you sit, or hunching over when you stand. ? If you work at a desk, sit close to it so you do not need to lean over. Keep your chin tucked  in. Keep your neck drawn back, and keep your elbows bent at a right angle. Your arms should look like the letter "L." ? Sit high and close to the steering wheel when you drive. Add lower back (lumbar) support to your car seat, if needed.  Take short walks on even surfaces as soon as you are able. Try to increase the length of time you walk each day.  Do not sit, drive, or stand in one place for more than 30 minutes at a time. Sitting or standing for long periods of time can put stress on your back.  Do not drive or use heavy machinery while taking prescription pain medicine.  Use proper lifting techniques. When you bend and lift, use positions that put less stress on your back: ? Bend your knees. ? Keep the load close to your body. ? Avoid twisting.  Exercise regularly as told by your health care provider. Exercising helps your back heal faster and helps prevent back injuries by keeping muscles strong and flexible.  Work with a physical therapist to make a safe exercise program, as recommended by your health care provider. Do any exercises as told by your physical therapist. Lifestyle  Maintain a healthy weight. Extra weight puts stress on your back and makes it difficult to have good posture.  Avoid activities or situations that make you feel anxious or stressed. Stress and anxiety increase muscle   tension and can make back pain worse. Learn ways to manage anxiety and stress, such as through exercise. General instructions  Sleep on a firm mattress in a comfortable position. Try lying on your side with your knees slightly bent. If you lie on your back, put a pillow under your knees.  Follow your treatment plan as told by your health care provider. This may include: ? Cognitive or behavioral therapy. ? Acupuncture or massage therapy. ? Meditation or yoga. Contact a health care provider if:  You have pain that is not relieved with rest or medicine.  You have increasing pain going down  into your legs or buttocks.  Your pain does not improve after 2 weeks.  You have pain at night.  You lose weight without trying.  You have a fever or chills. Get help right away if:  You develop new bowel or bladder control problems.  You have unusual weakness or numbness in your arms or legs.  You develop nausea or vomiting.  You develop abdominal pain.  You feel faint. Summary  Acute back pain is sudden and usually short-lived.  Use proper lifting techniques. When you bend and lift, use positions that put less stress on your back.  Take over-the-counter and prescription medicines and apply heat or ice as directed by your health care provider. This information is not intended to replace advice given to you by your health care provider. Make sure you discuss any questions you have with your health care provider. Document Revised: 05/09/2018 Document Reviewed: 09/01/2016 Elsevier Patient Education  2020 [employer]  

## 2019-08-21 ENCOUNTER — Encounter: Payer: Self-pay | Admitting: Physician Assistant

## 2019-08-21 DIAGNOSIS — G43409 Hemiplegic migraine, not intractable, without status migrainosus: Secondary | ICD-10-CM

## 2019-08-21 DIAGNOSIS — R634 Abnormal weight loss: Secondary | ICD-10-CM

## 2019-08-21 DIAGNOSIS — G43009 Migraine without aura, not intractable, without status migrainosus: Secondary | ICD-10-CM

## 2019-08-21 DIAGNOSIS — F3175 Bipolar disorder, in partial remission, most recent episode depressed: Secondary | ICD-10-CM

## 2019-08-21 DIAGNOSIS — F339 Major depressive disorder, recurrent, unspecified: Secondary | ICD-10-CM

## 2019-08-21 DIAGNOSIS — F39 Unspecified mood [affective] disorder: Secondary | ICD-10-CM

## 2019-08-21 DIAGNOSIS — K581 Irritable bowel syndrome with constipation: Secondary | ICD-10-CM

## 2019-08-21 MED ORDER — TRAZODONE HCL 100 MG PO TABS: 100.0000 mg | ORAL_TABLET | Freq: Every day | ORAL | 3 refills | Status: DC

## 2019-09-18 NOTE — Progress Notes (Signed)
Established patient visit   [name]   DOB: July 28, 1955   64 y.o. Female  MRN: [id number] Visit Date: 09/20/2019  Today's healthcare provider: Mar Daring, PA-C   Chief Complaint  Patient presents with  . Back Pain   Subjective    HPI  Patient here because she needs refills on the AIMOVIG.  Reports that her migraines right now are stable on current dose.  Back Pain: This is something chronic. Reports that whenever she does anything like to washing the dishes her back sounds like is grinding and popping.   Patient Active Problem List   Diagnosis Date Noted  . Dysplastic nevus 06/08/2019  . Abdominal pain 10/16/2018  . Abnormal CT scan, colon 10/16/2018  . Personal history of tobacco use, presenting hazards to health 10/03/2018  . Arm pain 12/05/2017  . Centrilobular emphysema ([place]) 12/04/2017  . SOB (shortness of breath) 03/21/2017  . Premature ventricular contractions 10/21/2016  . Heart palpitations 10/05/2016  . Cyst of right ovary 08/18/2016  . Adnexal mass 08/17/2016  . Bilateral carpal tunnel syndrome 04/08/2016  . Bilateral hand pain 04/08/2016  . 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 Acute onset aura migraine 07/10/2014  . Bad memory 07/10/2014  . Migraine without aura 07/10/2014  . Abdominal pain, epigastric 07/10/2014  . Chronic constipation 07/10/2014  . Weakness of left side of body 11/28/2011  . Acute anxiety 11/20/2009  . Anxiety and depression 11/20/2009  . Edema of extremities 11/20/2009  . Hypercholesterolemia without hypertriglyceridemia 06/18/2009  . 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 Malaise and fatigue 10/29/2008  . Weight loss, unintentional 10/29/2008  . Mixed hyperlipidemia 08/22/2008  . 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 Affective bipolar disorder ([place]) 05/17/2008  . Barrett esophagus 05/17/2008       Medications: Outpatient Medications Prior to Visit  Medication Sig  . albuterol  (PROVENTIL HFA;VENTOLIN HFA) 108 (90 Base) MCG/ACT inhaler Inhale 2 puffs into the lungs every 6 (six) hours as needed for wheezing or shortness of breath.  . ALPRAZolam (XANAX) 0.5 MG tablet Take 1 tablet (0.5 mg total) by mouth 3 (three) times daily as needed for anxiety.  . Ascorbic Acid (VITAMIN C) 1000 MG tablet Take 1,000 mg by mouth daily.  . baclofen (LIORESAL) 10 MG tablet Take 1 tablet (10 mg total) by mouth 3 (three) times daily.  . budesonide-formoterol (SYMBICORT) 160-4.5 MCG/ACT inhaler Inhale 1 puff into the lungs 2 (two) times daily.  . cyclobenzaprine (FLEXERIL) 5 MG tablet Take 1 tablet (5 mg total) by mouth 3 (three) times daily as needed for muscle spasms.  . DULoxetine (CYMBALTA) 20 MG capsule Take 2 capsules (40 mg total) by mouth daily.  Marland Kitchen gabapentin (NEURONTIN) 300 MG capsule Take 1 capsule (300 mg total) by mouth at bedtime.  Marland Kitchen loratadine (CLARITIN) 10 MG tablet Take 10 mg by mouth 2 (two) times daily.  . Multiple Vitamin (MULTIVITAMIN) tablet Take 1 tablet by mouth daily.  . pantoprazole (PROTONIX) 40 MG tablet TAKE 1 TABLET BY MOUTH ONCE A DAY  . Probiotic Product (PROBIOTIC-10) CHEW Chew 2 tablets by mouth daily.  . promethazine (PHENERGAN) 25 MG tablet Take 1 tablet (25 mg total) by mouth every 8 (eight) hours as needed for nausea or vomiting.  Marland Kitchen QUEtiapine (SEROQUEL) 25 MG tablet Take 3 tablets (75 mg total) by mouth at bedtime.  . Rimegepant Sulfate (NURTEC) 75 MG TBDP Take 75 mg by mouth every other day as needed (migraine).  . simvastatin (  ZOCOR) 20 MG tablet Take 1 tablet (20 mg total) by mouth at bedtime.  Marland Kitchen Specialty Vitamins Products (BIOTIN PLUS KERATIN) 10000-100 MCG-MG TABS Take 1 tablet by mouth daily.  . [DISCONTINUED] traZODone (DESYREL) 100 MG tablet Take 1 tablet (100 mg total) by mouth at bedtime.  Marland Kitchen linaclotide (LINZESS) 290 MCG CAPS capsule Take 1 capsule (290 mcg total) by mouth daily before breakfast. (Patient not taking: Reported on 09/20/2019)  .  [DISCONTINUED] Erenumab-aooe (AIMOVIG) 140 MG/ML SOAJ Inject 140 mg into the skin every 28 (twenty-eight) days. (Patient not taking: Reported on 09/20/2019)  . [DISCONTINUED] lipase/protease/amylase (CREON) 36000 UNITS CPEP capsule Take 2 caps with each meal and 1 with a snack (Patient not taking: Reported on 06/19/2019)  . [DISCONTINUED] meloxicam (MOBIC) 15 MG tablet Take 0.5-1 tablets (7.5-15 mg total) by mouth daily. (Patient not taking: Reported on 09/20/2019)  . [DISCONTINUED] mirtazapine (REMERON) 45 MG tablet Take 1 tablet (45 mg total) by mouth at bedtime. (Patient not taking: Reported on 09/20/2019)   No facility-administered medications prior to visit.    Review of Systems  Constitutional: Negative.   Respiratory: Negative.   Cardiovascular: Negative.   Musculoskeletal: Positive for back pain, gait problem and myalgias.  Neurological: Positive for headaches.    Last CBC Lab Results  Component Value Date   WBC 5.7 01/02/2019   HGB 13.3 01/02/2019   HCT 39.0 01/02/2019   MCV 99.5 01/02/2019   MCH 33.9 01/02/2019   RDW 12.0 01/02/2019   PLT 224 58/10/9831   Last metabolic panel Lab Results  Component Value Date   GLUCOSE 102 (H) 01/02/2019   NA 137 01/02/2019   K 4.4 01/02/2019   CL 104 01/02/2019   CO2 25 01/02/2019   BUN 17 01/02/2019   CREATININE 0.78 01/02/2019   GFRNONAA >60 01/02/2019   GFRAA >60 01/02/2019   CALCIUM 9.5 01/02/2019   PROT 7.4 01/02/2019   ALBUMIN 4.6 01/02/2019   LABGLOB 2.2 10/03/2018   AGRATIO 2.4 (H) 08/14/2018   BILITOT 0.6 01/02/2019   ALKPHOS 51 01/02/2019   AST 18 01/02/2019   ALT 17 01/02/2019   ANIONGAP 8 01/02/2019      Objective    BP 119/68 (BP Location: Left Arm, Patient Position: Sitting, Cuff Size: Small)   Pulse 73   Temp 99.1 F (37.3 C) (Oral)   Resp 16   Wt 93 lb (42.2 kg)   BMI 18.16 kg/m  BP Readings from Last 3 Encounters:  09/20/19 119/68  06/19/19 110/74  04/12/19 104/66   Wt Readings from Last 3  Encounters:  09/20/19 93 lb (42.2 kg)  06/19/19 94 lb (42.6 kg)  04/12/19 92 lb 14.4 oz (42.1 kg)      Physical Exam Vitals reviewed.  Constitutional:      General: She is not in acute distress.    Appearance: Normal appearance. She is well-developed. She is not ill-appearing or diaphoretic.  Neck:     Thyroid: No thyromegaly.     Vascular: No JVD.     Trachea: No tracheal deviation.  Cardiovascular:     Rate and Rhythm: Normal rate and regular rhythm.     Heart sounds: Normal heart sounds. No murmur heard.  No friction rub. No gallop.   Pulmonary:     Effort: Pulmonary effort is normal. No respiratory distress.     Breath sounds: Normal breath sounds. No wheezing or rales.  Musculoskeletal:     Cervical back: Normal range of motion and neck supple.  Thoracic back: No deformity. Scoliosis present.     Lumbar back: Spasms and tenderness present. No bony tenderness. Decreased range of motion. Negative right straight leg raise test and negative left straight leg raise test. Scoliosis present.  Lymphadenopathy:     Cervical: No cervical adenopathy.  Neurological:     Mental Status: She is alert.      No results found for any visits on 09/20/19.  Assessment & Plan     1. Hemiplegic migraine without status migrainosus, not intractable Stable. Diagnosis pulled for medication refill. Continue current medical treatment plan. - Erenumab-aooe (AIMOVIG) 140 MG/ML SOAJ; Inject 140 mg into the skin every 28 (twenty-eight) days.  Dispense: 1 mL; Refill: 11  2. Chronic midline low back pain without sciatica Acute on chronic issue. No previous xray. Will obtain as below. Will refill Norco, patient only uses prn (not daily). Will give medrol dose pak for acute flare. Has baclofen for muscle relaxer. Continue moist heat. Back exercises and stretches. Epsom salt soaks. May consider PT vs ortho or physical med referral. Call if worsening.   - DG Lumbar Spine Complete; Future -  HYDROcodone-acetaminophen (NORCO/VICODIN) 5-325 MG tablet; Take 1 tablet by mouth every 6 (six) hours as needed for moderate pain or severe pain.  Dispense: 45 tablet; Refill: 0 - methylPREDNISolone acetate (DEPO-MEDROL) injection 80 mg   Return if symptoms worsen or fail to improve.      Reynolds Bowl, PA-C, have reviewed all documentation for this visit. The documentation on 09/26/19 for the exam, diagnosis, procedures, and orders are all accurate and complete.   Rubye Beach  [facility] [phone] (phone) [phone] (fax)  [place]

## 2019-09-20 ENCOUNTER — Other Ambulatory Visit: Payer: Self-pay

## 2019-09-20 ENCOUNTER — Ambulatory Visit: Payer: BC Managed Care – PPO | Admitting: Physician Assistant

## 2019-09-20 ENCOUNTER — Encounter: Payer: Self-pay | Admitting: Physician Assistant

## 2019-09-20 VITALS — BP 119/68 | HR 73 | Temp 99.1°F | Resp 16 | Wt 93.0 lb

## 2019-09-20 DIAGNOSIS — G43409 Hemiplegic migraine, not intractable, without status migrainosus: Secondary | ICD-10-CM

## 2019-09-20 DIAGNOSIS — G8929 Other chronic pain: Secondary | ICD-10-CM

## 2019-09-20 DIAGNOSIS — M545 Low back pain: Secondary | ICD-10-CM | POA: Diagnosis not present

## 2019-09-20 MED ORDER — TRAZODONE HCL 100 MG PO TABS: 50.0000 mg | ORAL_TABLET | Freq: Every day | ORAL | 3 refills | Status: DC

## 2019-09-20 MED ORDER — METHYLPREDNISOLONE ACETATE 40 MG/ML IJ SUSP
80.0000 mg | Freq: Once | INTRAMUSCULAR | Status: AC
  Administered 2019-09-20: 80 mg via INTRAMUSCULAR

## 2019-09-20 MED ORDER — AIMOVIG 140 MG/ML ~~LOC~~ SOAJ: 140.0000 mg | SUBCUTANEOUS | 11 refills | Status: DC

## 2019-09-20 MED ORDER — HYDROCODONE-ACETAMINOPHEN 5-325 MG PO TABS: 1.0000 | ORAL_TABLET | Freq: Four times a day (QID) | ORAL | 0 refills | Status: DC | PRN

## 2019-09-20 NOTE — Patient Instructions (Signed)

## 2019-09-24 ENCOUNTER — Ambulatory Visit
Admission: RE | Admit: 2019-09-24 | Discharge: 2019-09-24 | Disposition: A | Discharge status: Home / Self Care | Payer: BC Managed Care – PPO | Attending: Physician Assistant | Admitting: Physician Assistant

## 2019-09-24 ENCOUNTER — Other Ambulatory Visit: Payer: Self-pay

## 2019-09-24 ENCOUNTER — Ambulatory Visit
Admission: RE | Admit: 2019-09-24 | Discharge: 2019-09-24 | Disposition: A | Discharge status: Home / Self Care | Payer: BC Managed Care – PPO | Source: Ambulatory Visit | Attending: Physician Assistant | Admitting: Physician Assistant

## 2019-09-24 DIAGNOSIS — G8929 Other chronic pain: Secondary | ICD-10-CM

## 2019-09-24 DIAGNOSIS — M545 Low back pain: Secondary | ICD-10-CM | POA: Diagnosis present

## 2019-09-25 ENCOUNTER — Telehealth: Payer: Self-pay

## 2019-09-25 DIAGNOSIS — M47817 Spondylosis without myelopathy or radiculopathy, lumbosacral region: Secondary | ICD-10-CM

## 2019-09-25 DIAGNOSIS — G8929 Other chronic pain: Secondary | ICD-10-CM

## 2019-09-25 DIAGNOSIS — M41126 Adolescent idiopathic scoliosis, lumbar region: Secondary | ICD-10-CM

## 2019-09-25 DIAGNOSIS — M545 Low back pain, unspecified: Secondary | ICD-10-CM

## 2019-09-25 NOTE — Telephone Encounter (Signed)
Called patient no answer. If patient calls back [facility] for [facility] nurse to give results.

## 2019-09-25 NOTE — Telephone Encounter (Signed)
-----   Message from Mar Daring, [place] sent at 09/24/2019  7:24 PM EDT ----- Scoliosis and arthritic changes are noted in the lumbar spine. How is your back feeling?

## 2019-09-25 NOTE — Telephone Encounter (Signed)
Attempted to reach pt, no answer,no VM.

## 2019-09-26 MED ORDER — MELOXICAM 7.5 MG PO TABS: 7.5000 mg | ORAL_TABLET | Freq: Every day | ORAL | 5 refills | Status: DC

## 2019-09-26 NOTE — Telephone Encounter (Signed)
Ok perfect.   I will place referral for her back to him

## 2019-09-26 NOTE — Telephone Encounter (Signed)
Patient reports that she is still having back pain. She would like another refill on Meloxicam. Please advise. Thanks!

## 2019-09-26 NOTE — Telephone Encounter (Signed)
Pt advised.  She sees Dr. Sharlet Salina for her neck and would like to see him for her back.   Thanks,   -Mickel Baas

## 2019-09-26 NOTE — Addendum Note (Signed)
Addended by: Mar Daring on: 09/26/2019 04:36 PM   Modules accepted: Orders

## 2019-09-26 NOTE — Telephone Encounter (Signed)
Meloxicam refilled.  As for further evaluation I could refer to orthopedic spine or physical medicine. Physical medicine would be more for consideration of steroid injections where as ortho spine would be more looking into possible surgical intervention, if needed.   We could always try physical therapy first as well.   Which would she prefer?

## 2019-09-26 NOTE — Telephone Encounter (Signed)
Pt. Given results, verbalizes understanding. Reports she is still having pain. Needs refill of meloxicam. Would like a copy of her spine xray. Wants to know what she should do now.Please advise pt.

## 2019-10-04 ENCOUNTER — Encounter: Payer: Self-pay | Admitting: Physician Assistant

## 2019-10-04 DIAGNOSIS — G8929 Other chronic pain: Secondary | ICD-10-CM

## 2019-10-04 DIAGNOSIS — M545 Low back pain, unspecified: Secondary | ICD-10-CM

## 2019-10-04 MED ORDER — MELOXICAM 7.5 MG PO TABS: 7.5000 mg | ORAL_TABLET | Freq: Two times a day (BID) | ORAL | 5 refills | Status: DC

## 2019-10-04 NOTE — Addendum Note (Signed)
Addended by: Mar Daring on: 10/04/2019 03:48 PM   Modules accepted: Orders

## 2019-10-17 ENCOUNTER — Other Ambulatory Visit: Payer: Self-pay | Admitting: Physician Assistant

## 2019-10-17 DIAGNOSIS — K2270 Barrett's esophagus without dysplasia: Secondary | ICD-10-CM

## 2019-10-22 ENCOUNTER — Other Ambulatory Visit: Payer: Self-pay | Admitting: Physician Assistant

## 2019-10-22 ENCOUNTER — Other Ambulatory Visit: Payer: Self-pay | Admitting: Physical Medicine and Rehabilitation

## 2019-10-22 ENCOUNTER — Other Ambulatory Visit (HOSPITAL_COMMUNITY): Payer: Self-pay | Admitting: Physical Medicine and Rehabilitation

## 2019-10-22 DIAGNOSIS — M5416 Radiculopathy, lumbar region: Secondary | ICD-10-CM

## 2019-10-22 DIAGNOSIS — Z1231 Encounter for screening mammogram for malignant neoplasm of breast: Secondary | ICD-10-CM

## 2019-10-26 ENCOUNTER — Telehealth: Payer: Self-pay

## 2019-10-26 NOTE — Telephone Encounter (Signed)
Contacted patient for lung screening based on referral from Dr. Tasia Catchings.  Patient agreeable to participate in program.  CT scheduled for 11/20/19 1:30.  Patient knows where the imaging center is located and was given phone number to Burgess Estelle, lung navigator.  Patient is current smoker.  She smokes 2 packs per day.  She states she smokes 100% percent of the 2 packs now as she is helping her son plan his wedding.  She started smoking at age 64.

## 2019-10-28 ENCOUNTER — Other Ambulatory Visit: Payer: Self-pay | Admitting: *Deleted

## 2019-10-28 DIAGNOSIS — Z122 Encounter for screening for malignant neoplasm of respiratory organs: Secondary | ICD-10-CM

## 2019-10-28 DIAGNOSIS — Z87891 Personal history of nicotine dependence: Secondary | ICD-10-CM

## 2019-10-28 NOTE — Progress Notes (Signed)
Current smoker, 92 pack year

## 2019-11-05 ENCOUNTER — Encounter: Payer: Self-pay | Admitting: Physician Assistant

## 2019-11-05 DIAGNOSIS — G8929 Other chronic pain: Secondary | ICD-10-CM

## 2019-11-05 MED ORDER — HYDROCODONE-ACETAMINOPHEN 5-325 MG PO TABS: 1.0000 | ORAL_TABLET | Freq: Four times a day (QID) | ORAL | 0 refills | Status: DC | PRN

## 2019-11-07 ENCOUNTER — Ambulatory Visit
Admission: RE | Admit: 2019-11-07 | Discharge: 2019-11-07 | Disposition: A | Discharge status: Home / Self Care | Payer: BC Managed Care – PPO | Source: Ambulatory Visit | Attending: Physical Medicine and Rehabilitation | Admitting: Physical Medicine and Rehabilitation

## 2019-11-07 ENCOUNTER — Ambulatory Visit
Admission: RE | Admit: 2019-11-07 | Discharge: 2019-11-07 | Disposition: A | Discharge status: Home / Self Care | Payer: BC Managed Care – PPO | Source: Ambulatory Visit | Attending: Physician Assistant | Admitting: Physician Assistant

## 2019-11-07 ENCOUNTER — Other Ambulatory Visit: Payer: Self-pay

## 2019-11-07 DIAGNOSIS — M5416 Radiculopathy, lumbar region: Secondary | ICD-10-CM

## 2019-11-07 DIAGNOSIS — Z1231 Encounter for screening mammogram for malignant neoplasm of breast: Secondary | ICD-10-CM | POA: Diagnosis present

## 2019-11-08 ENCOUNTER — Encounter: Payer: Self-pay | Admitting: Physician Assistant

## 2019-11-08 DIAGNOSIS — S22080A Wedge compression fracture of T11-T12 vertebra, initial encounter for closed fracture: Secondary | ICD-10-CM

## 2019-11-08 DIAGNOSIS — Z716 Tobacco abuse counseling: Secondary | ICD-10-CM

## 2019-11-08 MED ORDER — NICOTINE 21 MG/24HR TD PT24: 21.0000 mg | MEDICATED_PATCH | Freq: Every day | TRANSDERMAL | 0 refills | Status: DC

## 2019-11-08 NOTE — Addendum Note (Signed)
Addended by: Mar Daring on: 11/08/2019 01:42 PM   Modules accepted: Orders

## 2019-11-14 ENCOUNTER — Encounter: Payer: Self-pay | Admitting: Physician Assistant

## 2019-11-20 ENCOUNTER — Encounter: Payer: Self-pay | Admitting: Nurse Practitioner

## 2019-11-20 ENCOUNTER — Inpatient Hospital Stay: Payer: BC Managed Care – PPO | Attending: Nurse Practitioner | Admitting: Nurse Practitioner

## 2019-11-20 ENCOUNTER — Ambulatory Visit
Admission: RE | Admit: 2019-11-20 | Discharge: 2019-11-20 | Disposition: A | Discharge status: Home / Self Care | Payer: BC Managed Care – PPO | Source: Ambulatory Visit | Attending: Nurse Practitioner | Admitting: Nurse Practitioner

## 2019-11-20 ENCOUNTER — Other Ambulatory Visit: Payer: Self-pay

## 2019-11-20 DIAGNOSIS — Z122 Encounter for screening for malignant neoplasm of respiratory organs: Secondary | ICD-10-CM | POA: Diagnosis present

## 2019-11-20 DIAGNOSIS — Z87891 Personal history of nicotine dependence: Secondary | ICD-10-CM

## 2019-11-20 DIAGNOSIS — F17210 Nicotine dependence, cigarettes, uncomplicated: Secondary | ICD-10-CM | POA: Diagnosis not present

## 2019-11-20 NOTE — Progress Notes (Signed)
Virtual Visit via Video Enabled Telemedicine Note   I connected with Jordan Ortega on 11/20/19 at 2:15 PM EST by video enabled telemedicine visit and verified that I am speaking with the correct person using two identifiers.   I discussed the limitations, risks, security and privacy concerns of performing an evaluation and management service by telemedicine and the availability of in-person appointments. I also discussed with the patient that there may be a patient responsible charge related to this service. The patient expressed understanding and agreed to proceed.   Other persons participating in the visit and their role in the encounter: Burgess Estelle, RN- checking in patient & navigation  Patient's location: [place]  Provider's location: Clinic  Chief Complaint: Low Dose CT Screening  Patient agreed to evaluation by telemedicine to discuss shared decision making for consideration of low dose CT lung cancer screening.    In accordance with CMS guidelines, patient has met eligibility criteria including age, absence of signs or symptoms of lung cancer.  Social History   Tobacco Use  . Smoking status: Current Every Day Smoker    Packs/day: 2.00    Years: 46.00    Pack years: 92.00    Types: Cigarettes  . Smokeless tobacco: Never Used  . Tobacco comment: .5ppd currently  Substance Use Topics  . Alcohol use: Not Currently    Alcohol/week: 0.0 standard drinks     A shared decision-making session was conducted prior to the performance of CT scan. This includes one or more decision aids, includes benefits and harms of screening, follow-up diagnostic testing, over-diagnosis, false positive rate, and total radiation exposure.   Counseling on the importance of adherence to annual lung cancer LDCT screening, impact of co-morbidities, and ability or willingness to undergo diagnosis and treatment is imperative for compliance of the program.   Counseling on the importance of  continued smoking cessation for former smokers; the importance of smoking cessation for current smokers, and information about tobacco cessation interventions have been given to patient including [place] and [address] [employer] [place] programs.   Written order for lung cancer screening with LDCT has been given to the patient and any and all questions have been answered to the best of my abilities.    Yearly follow up will be coordinated by Burgess Estelle, Thoracic Navigator.  I discussed the assessment and treatment plan with the patient. The patient was provided an opportunity to ask questions and all were answered. The patient agreed with the plan and demonstrated an understanding of the instructions.   The patient was advised to call back or seek an in-person evaluation if the symptoms worsen or if the condition fails to improve as anticipated.   I provided 15 minutes of face-to-face video visit time during this encounter, and > 50% was spent counseling as documented under my assessment & plan.   Beckey Rutter, DNP, AGNP-C [place] at [facility] [phone] (clinic)

## 2019-11-22 ENCOUNTER — Encounter: Payer: Self-pay | Admitting: *Deleted

## 2019-12-07 ENCOUNTER — Encounter: Payer: Self-pay | Admitting: Physician Assistant

## 2019-12-12 ENCOUNTER — Ambulatory Visit: Payer: BC Managed Care – PPO | Admitting: Dermatology

## 2019-12-12 ENCOUNTER — Other Ambulatory Visit: Payer: Self-pay

## 2019-12-12 DIAGNOSIS — Z86018 Personal history of other benign neoplasm: Secondary | ICD-10-CM

## 2019-12-12 DIAGNOSIS — D2372 Other benign neoplasm of skin of left lower limb, including hip: Secondary | ICD-10-CM

## 2019-12-12 DIAGNOSIS — L814 Other melanin hyperpigmentation: Secondary | ICD-10-CM | POA: Diagnosis not present

## 2019-12-12 DIAGNOSIS — D229 Melanocytic nevi, unspecified: Secondary | ICD-10-CM | POA: Diagnosis not present

## 2019-12-12 DIAGNOSIS — D1800 Hemangioma unspecified site: Secondary | ICD-10-CM | POA: Diagnosis not present

## 2019-12-12 DIAGNOSIS — L578 Other skin changes due to chronic exposure to nonionizing radiation: Secondary | ICD-10-CM

## 2019-12-12 DIAGNOSIS — D239 Other benign neoplasm of skin, unspecified: Secondary | ICD-10-CM

## 2019-12-12 NOTE — Patient Instructions (Signed)

## 2019-12-12 NOTE — Progress Notes (Signed)
   Follow-Up Visit   Subjective  Jordan Ortega is a 64 y.o. female who presents for the following: Follow-up (Recheck moderate dysplastic nevus, spot at leg, back.  The following portions of the chart were reviewed this encounter and updated as appropriate: Tobacco  Allergies  Meds  Problems  Med Hx  Surg Hx  Fam Hx      Review of Systems: No other skin or systemic complaints except as noted in HPI or Assessment and Plan.   Objective  Well appearing patient in no apparent distress; mood and affect are within normal limits.   A focused examination was performed including forehead, abdomen, , left leg, and back. Relevant physical exam findings are noted in the Assessment and Plan.  Objective  Left Lower Leg - Anterior: Firm pink/brown papulenodule with dimple sign.   Assessment & Plan  Dermatofibroma Left Lower Leg - Anterior  Benign-appearing.  Observation.  Call clinic for new or changing lesions.  Advised no good way to remove these. Freezing or injecting with intralesional steroid can make them smaller if they are catching while shaving or getting inflamed.   Melanocytic Nevi - Tan-brown and/or pink-flesh-colored symmetric macules and papules - Benign appearing on exam today - Observation - Call clinic for new or changing moles - Recommend daily use of broad spectrum spf 30+ sunscreen to sun-exposed areas.   Lentigines - Scattered tan macules - Discussed due to sun exposure - Benign, observe - Call for any changes  Hemangiomas - Red papules - Discussed benign nature - Observe - Call for any changes  Actinic Damage - chronic, secondary to cumulative UV radiation exposure/sun exposure over time - diffuse scaly erythematous macules with underlying dyspigmentation - Recommend daily broad spectrum sunscreen SPF 30+ to sun-exposed areas, reapply every 2 hours as needed.  - Call for new or changing lesions.  History of Dysplastic Nevus right lower abdomen -  No evidence of recurrence today - Recommend regular full body skin exams - Recommend daily broad spectrum sunscreen SPF 30+ to sun-exposed areas, reapply every 2 hours as needed.  - Call if any new or changing lesions are noted between office visits  Return in about 6 months (around 06/10/2020) for TBSE.   I, Harriett Sine, CMA, am acting as scribe for Forest Gleason, MD.  Documentation: I have reviewed the above documentation for accuracy and completeness, and I agree with the above.  Forest Gleason, MD

## 2019-12-17 ENCOUNTER — Ambulatory Visit
Admission: RE | Admit: 2019-12-17 | Discharge: 2019-12-17 | Disposition: A | Discharge status: Home / Self Care | Payer: BC Managed Care – PPO | Source: Ambulatory Visit | Attending: Physician Assistant | Admitting: Physician Assistant

## 2019-12-17 ENCOUNTER — Other Ambulatory Visit: Payer: Self-pay

## 2019-12-17 DIAGNOSIS — S22080A Wedge compression fracture of T11-T12 vertebra, initial encounter for closed fracture: Secondary | ICD-10-CM

## 2019-12-18 NOTE — Progress Notes (Signed)
Established patient visit   [name]   DOB: 1955/05/30   64 y.o. Female  MRN: [id number] Visit Date: 12/19/2019  Today's healthcare provider: Mar Daring, PA-C   Chief Complaint  Patient presents with  . Results   Subjective    HPI  Patient coming in to discuss Bone density results and to discuss treatment options. Bone Density shows osteoporosis. T score -4.4 in lumbar spine, -2.8 in right femur. Has active compression fracture that is not healing. Is being followed by Dr. Sharlet Salina. Has had 2 ESI without relief.     Patient Active Problem List   Diagnosis Date Noted  . Dysplastic nevus 06/08/2019  . Abdominal pain 10/16/2018  . Abnormal CT scan, colon 10/16/2018  . Personal history of tobacco use, presenting hazards to health 10/03/2018  . Arm pain 12/05/2017  . Centrilobular emphysema ([place]) 12/04/2017  . SOB (shortness of breath) 03/21/2017  . Premature ventricular contractions 10/21/2016  . Heart palpitations 10/05/2016  . Cyst of right ovary 08/18/2016  . Adnexal mass 08/17/2016  . Bilateral carpal tunnel syndrome 04/08/2016  . Bilateral hand pain 04/08/2016  . 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 Acute onset aura migraine 07/10/2014  . Bad memory 07/10/2014  . Migraine without aura 07/10/2014  . Abdominal pain, epigastric 07/10/2014  . Chronic constipation 07/10/2014  . Weakness of left side of body 11/28/2011  . Acute anxiety 11/20/2009  . Anxiety and depression 11/20/2009  . Edema of extremities 11/20/2009  . Hypercholesterolemia without hypertriglyceridemia 06/18/2009  . 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 Malaise and fatigue 10/29/2008  . Weight loss, unintentional 10/29/2008  . Mixed hyperlipidemia 08/22/2008  . 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 Affective bipolar disorder ([place]) 05/17/2008  . Barrett esophagus 05/17/2008   Past Medical History:  Diagnosis Date  . Actinic keratosis   . Adnexal mass   .  Anxiety   . Barrett's esophagus   . Bell palsy    left foot also ,   . Bronchitis   . Complication of anesthesia    difficulty waking up  . COPD (chronic obstructive pulmonary disease) ([place])   . Degenerative disc disease, cervical   . Depression   . Dysplastic nevus 06/08/2019   Right lower abdomen, moderate atypia, limited margins free.  Marland Kitchen Dysplastic nevus 12/06/2018   right lower abdomen/moderate, limited margins free.  . Environmental and seasonal allergies   . GERD (gastroesophageal reflux disease)   . Heart murmur    Hx  . High triglycerides   . Migraines   . Migraines   . Rapid heart rate        Medications: Outpatient Medications Prior to Visit  Medication Sig  . albuterol (PROVENTIL HFA;VENTOLIN HFA) 108 (90 Base) MCG/ACT inhaler Inhale 2 puffs into the lungs every 6 (six) hours as needed for wheezing or shortness of breath.  . ALPRAZolam (XANAX) 0.5 MG tablet Take 1 tablet (0.5 mg total) by mouth 3 (three) times daily as needed for anxiety.  . Ascorbic Acid (VITAMIN C) 1000 MG tablet Take 1,000 mg by mouth daily.  . baclofen (LIORESAL) 10 MG tablet Take 1 tablet (10 mg total) by mouth 3 (three) times daily.  . budesonide-formoterol (SYMBICORT) 160-4.5 MCG/ACT inhaler Inhale 1 puff into the lungs 2 (two) times daily.  . DULoxetine (CYMBALTA) 20 MG capsule Take 2 capsules (40 mg total) by mouth daily.  Eduard Roux (AIMOVIG) 140 MG/ML SOAJ Inject 140 mg into the skin every 28 (twenty-eight)  days.  . gabapentin (NEURONTIN) 300 MG capsule Take 1 capsule (300 mg total) by mouth at bedtime.  Marland Kitchen HYDROcodone-acetaminophen (NORCO/VICODIN) 5-325 MG tablet Take 1 tablet by mouth every 6 (six) hours as needed for moderate pain or severe pain.  Marland Kitchen loratadine (CLARITIN) 10 MG tablet Take 10 mg by mouth 2 (two) times daily.  . meloxicam (MOBIC) 7.5 MG tablet Take 1 tablet (7.5 mg total) by mouth in the morning and at bedtime.  . Multiple Vitamin (MULTIVITAMIN) tablet Take 1 tablet  by mouth daily.  . nicotine (NICODERM CQ - DOSED IN MG/24 HOURS) 21 mg/24hr patch Place 1 patch (21 mg total) onto the skin daily.  . pantoprazole (PROTONIX) 40 MG tablet TAKE 1 TABLET BY MOUTH ONCE A DAY  . Probiotic Product (PROBIOTIC-10) CHEW Chew 2 tablets by mouth daily.  . promethazine (PHENERGAN) 25 MG tablet Take 1 tablet (25 mg total) by mouth every 8 (eight) hours as needed for nausea or vomiting.  Marland Kitchen QUEtiapine (SEROQUEL) 25 MG tablet Take 3 tablets (75 mg total) by mouth at bedtime.  . Rimegepant Sulfate (NURTEC) 75 MG TBDP Take 75 mg by mouth every other day as needed (migraine).  . simvastatin (ZOCOR) 20 MG tablet Take 1 tablet (20 mg total) by mouth at bedtime.  Marland Kitchen Specialty Vitamins Products (BIOTIN PLUS KERATIN) 10000-100 MCG-MG TABS Take 1 tablet by mouth daily.  . traZODone (DESYREL) 100 MG tablet Take 0.5 tablets (50 mg total) by mouth at bedtime.  . cyclobenzaprine (FLEXERIL) 5 MG tablet Take 1 tablet (5 mg total) by mouth 3 (three) times daily as needed for muscle spasms.  Marland Kitchen linaclotide (LINZESS) 290 MCG CAPS capsule Take 1 capsule (290 mcg total) by mouth daily before breakfast. (Patient not taking: Reported on 09/20/2019)   No facility-administered medications prior to visit.    Review of Systems  Constitutional: Negative.   Respiratory: Negative.   Cardiovascular: Negative.   Musculoskeletal: Positive for back pain, gait problem and myalgias.  Neurological: Positive for weakness.    Last CBC Lab Results  Component Value Date   WBC 5.7 01/02/2019   HGB 13.3 01/02/2019   HCT 39.0 01/02/2019   MCV 99.5 01/02/2019   MCH 33.9 01/02/2019   RDW 12.0 01/02/2019   PLT 224 54/00/8676   Last metabolic panel Lab Results  Component Value Date   GLUCOSE 102 (H) 01/02/2019   NA 137 01/02/2019   K 4.4 01/02/2019   CL 104 01/02/2019   CO2 25 01/02/2019   BUN 17 01/02/2019   CREATININE 0.78 01/02/2019   GFRNONAA >60 01/02/2019   GFRAA >60 01/02/2019   CALCIUM 9.5  01/02/2019   PROT 7.4 01/02/2019   ALBUMIN 4.6 01/02/2019   LABGLOB 2.2 10/03/2018   AGRATIO 2.4 (H) 08/14/2018   BILITOT 0.6 01/02/2019   ALKPHOS 51 01/02/2019   AST 18 01/02/2019   ALT 17 01/02/2019   ANIONGAP 8 01/02/2019      Objective    BP 123/69 (BP Location: Left Arm, Patient Position: Sitting, Cuff Size: Small)   Pulse 69   Temp 98.2 F (36.8 C) (Oral)   Resp 16   Wt 92 lb 9.6 oz (42 kg)   BMI 18.08 kg/m  BP Readings from Last 3 Encounters:  12/19/19 123/69  09/20/19 119/68  06/19/19 110/74   Wt Readings from Last 3 Encounters:  12/19/19 92 lb 9.6 oz (42 kg)  11/20/19 100 lb (45.4 kg)  09/20/19 93 lb (42.2 kg)      Physical  Exam Vitals reviewed.  Constitutional:      Appearance: Normal appearance. She is well-developed and normal weight. She is ill-appearing (in apparent discomfort; couldn't get comfortable.).  HENT:     Head: Normocephalic and atraumatic.  Pulmonary:     Effort: Pulmonary effort is normal. No respiratory distress.  Musculoskeletal:     Cervical back: Normal range of motion and neck supple.  Neurological:     Mental Status: She is alert.  Psychiatric:        Behavior: Behavior normal.        Thought Content: Thought content normal.        Judgment: Judgment normal.     Narrative & Impression  EXAM: DUAL X-RAY ABSORPTIOMETRY (DXA) FOR BONE MINERAL DENSITY  IMPRESSION: Your patient Payeton Germani completed a BMD test on 12/17/2019 using the [place] (software version: 14.10) manufactured by [employer]. The following summarizes the results of our evaluation. Technologist:VLM PATIENT BIOGRAPHICAL: Name: Isabela, Nardelli Patient ID: [id number] Birth Date: 06-05-1955 Height: 60.0 in. Gender: Female Exam Date: 12/17/2019 Weight: 88.0 lbs. Indications: Caucasian, Height Loss, History of Compression Fracture, Hysterectomy, Low Body Weight, Tobacco User Fractures: compression fracture Treatments: Vitamin  D DENSITOMETRY RESULTS: Site         Region      Measured Date Measured Age WHO Classification Young Adult T-score BMD         %Change vs. Previous Significant Change (*) AP Spine L1-L3 12/17/2019 [age] Osteoporosis -4.4 0.642 g/cm2 - -  DualFemur Total Right 12/17/2019 64.0 Osteoporosis -2.8 0.655 g/cm2 - -  Left Forearm Radius 33% 12/17/2019 64.0 Osteopenia -2.1 0.692 g/cm2 - - ASSESSMENT: The BMD measured at AP Spine L1-L3 is 0.642 g/cm2 with a T-score of -4.4. This patient is considered osteoporotic according to [facility]) criteria. L- 4 was excluded due to degenerative changes. The scan quality is good.  World Pharmacologist [facility]) criteria for post-menopausal, Caucasian Women: Normal:                   T-score at or above -1 SD Osteopenia/low bone mass: T-score between -1 and -2.5 SD Osteoporosis:             T-score at or below -2.5 SD  RECOMMENDATIONS: 1. All patients should optimize calcium and vitamin D intake. 2. Consider FDA-approved medical therapies in postmenopausal women and men aged 72 years and older, based on the following: a. A hip or vertebral(clinical or morphometric) fracture b. T-score < -2.5 at the femoral neck or spine after appropriate evaluation to exclude secondary causes c. Low bone mass (T-score between -1.0 and -2.5 at the femoral neck or spine) and a 10-year probability of a hip fracture > 3% or a 10-year probability of a major osteoporosis-related fracture > 20% based on the [place]-adapted WHO algorithm 3. Clinician judgment and/or patient preferences may indicate treatment for people with 10-year fracture probabilities above or below these levels FOLLOW-UP: People with diagnosed cases of osteoporosis or at high risk for fracture should have regular bone mineral density tests. For patients eligible for Medicare, routine testing is allowed once every 2 years. The testing frequency can be increased to one year for  patients who have rapidly progressing disease, those who are receiving or discontinuing medical therapy to restore bone mass, or have additional risk factors.  I have reviewed this report, and agree with the above findings.  [facility]   Electronically Signed   By: Gwyndolyn Saxon  Jasmine December III M.D.   On: 12/17/2019 15:29      No results found for any visits on 12/19/19.  Assessment & Plan     1. Muscle spasm Flexeril for bad muscle spasms. Can use Baclofen daily for normal activity. Advised to not take both together.  - cyclobenzaprine (FLEXERIL) 5 MG tablet; Take 1 tablet (5 mg total) by mouth 3 (three) times daily as needed for muscle spasms.  Dispense: 30 tablet; Refill: 1  2. Acute midline low back pain without sciatica Increased Gabapentin to 600mg  from 300mg  at bedtime. F/U if adverse effects or intolerance. Also increased Norco to be able to take every 4 hours prn. Continue f/u with Dr. Sharlet Salina as directed.  - cyclobenzaprine (FLEXERIL) 5 MG tablet; Take 1 tablet (5 mg total) by mouth 3 (three) times daily as needed for muscle spasms.  Dispense: 30 tablet; Refill: 1 - gabapentin (NEURONTIN) 600 MG tablet; Take 1 tablet (600 mg total) by mouth at bedtime.  Dispense: 90 tablet; Refill: 1  3. Chronic midline low back pain without sciatica See above medical treatment plan. - HYDROcodone-acetaminophen (NORCO/VICODIN) 5-325 MG tablet; Take 1 tablet by mouth every 4 (four) hours as needed for moderate pain or severe pain.  Dispense: 90 tablet; Refill: 0 - gabapentin (NEURONTIN) 600 MG tablet; Take 1 tablet (600 mg total) by mouth at bedtime.  Dispense: 90 tablet; Refill: 1  4. Localized osteoporosis with current pathological fracture with delayed healing, subsequent encounter Referral to Endocrinology for consideration of treatment options. Prefer Prolia if possible. Bisphosphonate medications are not a good option due to patient with known Barrett's esophagitis,  currently stable.  - Ambulatory referral to Endocrinology   No follow-ups on file.      Reynolds Bowl, PA-C, have reviewed all documentation for this visit. The documentation on 12/19/19 for the exam, diagnosis, procedures, and orders are all accurate and complete.   Rubye Beach  [facility] [phone] (phone) [phone] (fax)  [place]

## 2019-12-19 ENCOUNTER — Encounter: Payer: Self-pay | Admitting: Physician Assistant

## 2019-12-19 ENCOUNTER — Ambulatory Visit: Payer: BC Managed Care – PPO | Admitting: Physician Assistant

## 2019-12-19 ENCOUNTER — Other Ambulatory Visit: Payer: Self-pay

## 2019-12-19 VITALS — BP 123/69 | HR 69 | Temp 98.2°F | Resp 16 | Wt 92.6 lb

## 2019-12-19 DIAGNOSIS — M62838 Other muscle spasm: Secondary | ICD-10-CM | POA: Diagnosis not present

## 2019-12-19 DIAGNOSIS — M545 Low back pain, unspecified: Secondary | ICD-10-CM

## 2019-12-19 DIAGNOSIS — M8080XG Other osteoporosis with current pathological fracture, unspecified site, subsequent encounter for fracture with delayed healing: Secondary | ICD-10-CM | POA: Diagnosis not present

## 2019-12-19 DIAGNOSIS — G8929 Other chronic pain: Secondary | ICD-10-CM

## 2019-12-19 MED ORDER — CYCLOBENZAPRINE HCL 5 MG PO TABS: 5.0000 mg | ORAL_TABLET | Freq: Three times a day (TID) | ORAL | 1 refills | Status: DC | PRN

## 2019-12-19 MED ORDER — HYDROCODONE-ACETAMINOPHEN 5-325 MG PO TABS: 1.0000 | ORAL_TABLET | ORAL | 0 refills | Status: DC | PRN

## 2019-12-19 MED ORDER — GABAPENTIN 600 MG PO TABS: 600.0000 mg | ORAL_TABLET | Freq: Every day | ORAL | 1 refills | Status: DC

## 2019-12-19 NOTE — Patient Instructions (Signed)
Osteoporosis  Osteoporosis is thinning and loss of density in your bones. Osteoporosis makes bones more brittle and fragile and more likely to break (fracture). Over time, osteoporosis can cause your bones to become so weak that they fracture after a minor fall. Bones in the hip, wrist, and spine are most likely to fracture due to osteoporosis. What are the causes? The exact cause of this condition is not known. What increases the risk? You may be at greater risk for osteoporosis if you:  Have a family history of the condition.  Have poor nutrition.  Use steroid medicines, such as prednisone.  Are female.  Are age 50 or older.  Smoke or have a history of smoking.  Are not physically active (are sedentary).  Are white (Caucasian) or of Asian descent.  Have a small body frame.  Take certain medicines, such as antiseizure medicines. What are the signs or symptoms? A fracture might be the first sign of osteoporosis, especially if the fracture results from a fall or injury that usually would not cause a bone to break. Other signs and symptoms include:  Pain in the neck or low back.  Stooped posture.  Loss of height. How is this diagnosed? This condition may be diagnosed based on:  Your medical history.  A physical exam.  A bone mineral density test, also called a DXA or DEXA test (dual-energy X-ray absorptiometry test). This test uses X-rays to measure the amount of minerals in your bones. How is this treated? The goal of treatment is to strengthen your bones and lower your risk for a fracture. Treatment may involve:  Making lifestyle changes, such as: ? Including foods with more calcium and vitamin D in your diet. ? Doing weight-bearing and muscle-strengthening exercises. ? Stopping tobacco use. ? Limiting alcohol intake.  Taking medicine to slow the process of bone loss or to increase bone density.  Taking daily supplements of calcium and vitamin D.  Taking  hormone replacement medicines, such as estrogen for women and testosterone for men.  Monitoring your levels of calcium and vitamin D. Follow these instructions at home:  Activity  Exercise as told by your health care provider. Ask your health care provider what exercises and activities are safe for you. You should do: ? Exercises that make you work against gravity (weight-bearing exercises), such as tai chi, yoga, or walking. ? Exercises to strengthen muscles, such as lifting weights. Lifestyle  Limit alcohol intake to no more than 1 drink a day for nonpregnant women and 2 drinks a day for men. One drink equals 12 oz of beer, 5 oz of wine, or 1 oz of hard liquor.  Do not use any products that contain nicotine or tobacco, such as cigarettes and e-cigarettes. If you need help quitting, ask your health care provider. Preventing falls  Use devices to help you move around (mobility aids) as needed, such as canes, walkers, scooters, or crutches.  Keep rooms well-lit and clutter-free.  Remove tripping hazards from walkways, including cords and throw rugs.  Install grab bars in bathrooms and safety rails on stairs.  Use rubber mats in the bathroom and other areas that are often wet or slippery.  Wear closed-toe shoes that fit well and support your feet. Wear shoes that have rubber soles or low heels.  Review your medicines with your health care provider. Some medicines can cause dizziness or changes in blood pressure, which can increase your risk of falling. General instructions  Include calcium and vitamin D in   your diet. Calcium is important for bone health, and vitamin D helps your body to absorb calcium. Good sources of calcium and vitamin D include: ? Certain fatty fish, such as salmon and tuna. ? Products that have calcium and vitamin D added to them (fortified products), such as fortified cereals. ? Egg yolks. ? Cheese. ? Liver.  Take over-the-counter and prescription medicines  only as told by your health care provider.  Keep all follow-up visits as told by your health care provider. This is important. Contact a health care provider if:  You have never been screened for osteoporosis and you are: ? A woman who is age 49 or older. ? A man who is age 25 or older. Get help right away if:  You fall or injure yourself. Summary  Osteoporosis is thinning and loss of density in your bones. This makes bones more brittle and fragile and more likely to break (fracture),even with minor falls.  The goal of treatment is to strengthen your bones and reduce your risk for a fracture.  Include calcium and vitamin D in your diet. Calcium is important for bone health, and vitamin D helps your body to absorb calcium.  Talk with your health care provider about screening for osteoporosis if you are a woman who is age 5 or older, or a man who is age 75 or older. This information is not intended to replace advice given to you by your health care provider. Make sure you discuss any questions you have with your health care provider. Document Revised: 12/31/2016 Document Reviewed: 11/12/2016 Elsevier Patient Education  [place].   Prolia/Denosumab injection What is this medicine? DENOSUMAB (den oh sue mab) slows bone breakdown. Prolia is used to treat osteoporosis in women after menopause and in men, and in people who are taking corticosteroids for 6 months or more. Delton See is used to treat a high calcium level due to cancer and to prevent bone fractures and other bone problems caused by multiple myeloma or cancer bone metastases. Delton See is also used to treat giant cell tumor of the bone. This medicine may be used for other purposes; ask your health care provider or pharmacist if you have questions. COMMON BRAND NAME(S): Prolia, XGEVA What should I tell my health care provider before I take this medicine? They need to know if you have any of these conditions:  dental  disease  having surgery or tooth extraction  infection  kidney disease  low levels of calcium or Vitamin D in the blood  malnutrition  on hemodialysis  skin conditions or sensitivity  thyroid or parathyroid disease  an unusual reaction to denosumab, other medicines, foods, dyes, or preservatives  pregnant or trying to get pregnant  breast-feeding How should I use this medicine? This medicine is for injection under the skin. It is given by a health care professional in a hospital or clinic setting. A special MedGuide will be given to you before each treatment. Be sure to read this information carefully each time. For Prolia, talk to your pediatrician regarding the use of this medicine in children. Special care may be needed. For Delton See, talk to your pediatrician regarding the use of this medicine in children. While this drug may be prescribed for children as young as 13 years for selected conditions, precautions do apply. Overdosage: If you think you have taken too much of this medicine contact a poison control center or emergency room at once. NOTE: This medicine is only for you. Do not share  this medicine with others. What if I miss a dose? It is important not to miss your dose. Call your doctor or health care professional if you are unable to keep an appointment. What may interact with this medicine? Do not take this medicine with any of the following medications:  other medicines containing denosumab This medicine may also interact with the following medications:  medicines that lower your chance of fighting infection  steroid medicines like prednisone or cortisone This list may not describe all possible interactions. Give your health care provider a list of all the medicines, herbs, non-prescription drugs, or dietary supplements you use. Also tell them if you smoke, drink alcohol, or use illegal drugs. Some items may interact with your medicine. What should I watch for  while using this medicine? Visit your doctor or health care professional for regular checks on your progress. Your doctor or health care professional may order blood tests and other tests to see how you are doing. Call your doctor or health care professional for advice if you get a fever, chills or sore throat, or other symptoms of a cold or flu. Do not treat yourself. This drug may decrease your body's ability to fight infection. Try to avoid being around people who are sick. You should make sure you get enough calcium and vitamin D while you are taking this medicine, unless your doctor tells you not to. Discuss the foods you eat and the vitamins you take with your health care professional. See your dentist regularly. Brush and floss your teeth as directed. Before you have any dental work done, tell your dentist you are receiving this medicine. Do not become pregnant while taking this medicine or for 5 months after stopping it. Talk with your doctor or health care professional about your birth control options while taking this medicine. Women should inform their doctor if they wish to become pregnant or think they might be pregnant. There is a potential for serious side effects to an unborn child. Talk to your health care professional or pharmacist for more information. What side effects may I notice from receiving this medicine? Side effects that you should report to your doctor or health care professional as soon as possible:  allergic reactions like skin rash, itching or hives, swelling of the face, lips, or tongue  bone pain  breathing problems  dizziness  jaw pain, especially after dental work  redness, blistering, peeling of the skin  signs and symptoms of infection like fever or chills; cough; sore throat; pain or trouble passing urine  signs of low calcium like fast heartbeat, muscle cramps or muscle pain; pain, tingling, numbness in the hands or feet; seizures  unusual bleeding or  bruising  unusually weak or tired Side effects that usually do not require medical attention (report to your doctor or health care professional if they continue or are bothersome):  constipation  diarrhea  headache  joint pain  loss of appetite  muscle pain  runny nose  tiredness  upset stomach This list may not describe all possible side effects. Call your doctor for medical advice about side effects. You may report side effects to FDA at 1-800-FDA-1088. Where should I keep my medicine? This medicine is only given in a clinic, doctor's office, or other health care setting and will not be stored at home. NOTE: This sheet is a summary. It may not cover all possible information. If you have questions about this medicine, talk to your doctor, pharmacist, or health care  provider.  2020 Elsevier/Gold Standard (2017-05-27 16:10:44)

## 2019-12-24 ENCOUNTER — Encounter: Payer: Self-pay | Admitting: Physician Assistant

## 2019-12-24 ENCOUNTER — Encounter: Payer: Self-pay | Admitting: Dermatology

## 2020-01-01 ENCOUNTER — Other Ambulatory Visit (HOSPITAL_COMMUNITY): Payer: Self-pay | Admitting: Physical Medicine and Rehabilitation

## 2020-01-01 DIAGNOSIS — R1031 Right lower quadrant pain: Secondary | ICD-10-CM

## 2020-01-01 DIAGNOSIS — M5136 Other intervertebral disc degeneration, lumbar region: Secondary | ICD-10-CM

## 2020-01-18 ENCOUNTER — Other Ambulatory Visit: Payer: Self-pay

## 2020-01-18 ENCOUNTER — Encounter: Payer: Self-pay | Admitting: Physician Assistant

## 2020-01-18 ENCOUNTER — Ambulatory Visit: Payer: BC Managed Care – PPO | Admitting: Physician Assistant

## 2020-01-18 ENCOUNTER — Ambulatory Visit: Payer: Self-pay

## 2020-01-18 ENCOUNTER — Ambulatory Visit: Payer: BC Managed Care – PPO

## 2020-01-18 ENCOUNTER — Telehealth: Payer: Self-pay

## 2020-01-18 VITALS — BP 111/71 | HR 65 | Temp 98.5°F | Wt 93.0 lb

## 2020-01-18 DIAGNOSIS — G43419 Hemiplegic migraine, intractable, without status migrainosus: Secondary | ICD-10-CM

## 2020-01-18 DIAGNOSIS — M545 Low back pain, unspecified: Secondary | ICD-10-CM

## 2020-01-18 DIAGNOSIS — M62838 Other muscle spasm: Secondary | ICD-10-CM

## 2020-01-18 MED ORDER — DIPHENHYDRAMINE HCL 25 MG PO CAPS
50.0000 mg | ORAL_CAPSULE | Freq: Once | ORAL | Status: AC
  Administered 2020-01-18: 50 mg via ORAL

## 2020-01-18 MED ORDER — PROMETHAZINE HCL 25 MG/ML IJ SOLN
25.0000 mg | Freq: Once | INTRAMUSCULAR | Status: AC
  Administered 2020-01-18: 25 mg via INTRAMUSCULAR

## 2020-01-18 MED ORDER — CYCLOBENZAPRINE HCL 5 MG PO TABS: 5.0000 mg | ORAL_TABLET | Freq: Three times a day (TID) | ORAL | 5 refills | Status: DC | PRN

## 2020-01-18 MED ORDER — KETOROLAC TROMETHAMINE 60 MG/2ML IM SOLN
60.0000 mg | Freq: Once | INTRAMUSCULAR | Status: AC
  Administered 2020-01-18: 60 mg via INTRAMUSCULAR

## 2020-01-18 MED ORDER — DIPHENHYDRAMINE HCL 50 MG/ML IJ SOLN: 50.0000 mg | Freq: Once | INTRAMUSCULAR | Status: DC

## 2020-01-18 NOTE — Telephone Encounter (Signed)
Apt today at 3:40   Thanks,   -Mickel Baas

## 2020-01-18 NOTE — Progress Notes (Signed)
Established patient visit   [name]   DOB: 1955/02/27   64 y.o. Female  MRN: [id number] Visit Date: 01/18/2020  Today's healthcare provider: Mar Daring, PA-C   Chief Complaint  Patient presents with  . Migraine   Subjective    Migraine  This is a recurrent problem. The current episode started today. The problem has been unchanged. The pain is located in the left unilateral region. The pain radiates to the face. The pain quality is similar to prior headaches. The quality of the pain is described as throbbing, stabbing and dull. The pain is at a severity of 9/10. Associated symptoms include nausea and photophobia. Pertinent negatives include no abdominal pain, dizziness, numbness or vomiting.   Symptoms started around 5:30am this morning. Awoke her from sleep. Took her normal medication and tried to work. Around 11am had to go home.    Patient Active Problem List   Diagnosis Date Noted  . Dysplastic nevus 06/08/2019  . Abdominal pain 10/16/2018  . Abnormal CT scan, colon 10/16/2018  . Personal history of tobacco use, presenting hazards to health 10/03/2018  . Arm pain 12/05/2017  . Centrilobular emphysema ([place]) 12/04/2017  . SOB (shortness of breath) 03/21/2017  . Premature ventricular contractions 10/21/2016  . Heart palpitations 10/05/2016  . Cyst of right ovary 08/18/2016  . Adnexal mass 08/17/2016  . Bilateral carpal tunnel syndrome 04/08/2016  . Bilateral hand pain 04/08/2016  . 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 Acute onset aura migraine 07/10/2014  . Bad memory 07/10/2014  . Migraine without aura 07/10/2014  . Abdominal pain, epigastric 07/10/2014  . Chronic constipation 07/10/2014  . Weakness of left side of body 11/28/2011  . Acute anxiety 11/20/2009  . Anxiety and depression 11/20/2009  . Edema of extremities 11/20/2009  . Hypercholesterolemia without hypertriglyceridemia 06/18/2009  . Depletion of volume of extracellular fluid  06/16/2009  . Malaise and fatigue 10/29/2008  . Weight loss, unintentional 10/29/2008  . Mixed hyperlipidemia 08/22/2008  . 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 Affective bipolar disorder ([place]) 05/17/2008  . Barrett esophagus 05/17/2008   Past Medical History:  Diagnosis Date  . Actinic keratosis   . Adnexal mass   . Anxiety   . Barrett's esophagus   . Bell palsy    left foot also ,   . Bronchitis   . Complication of anesthesia    difficulty waking up  . COPD (chronic obstructive pulmonary disease) ([place])   . Degenerative disc disease, cervical   . Depression   . Dysplastic nevus 06/08/2019   Right lower abdomen, moderate atypia, limited margins free.  Marland Kitchen Dysplastic nevus 12/06/2018   right lower abdomen/moderate, limited margins free.  . Environmental and seasonal allergies   . GERD (gastroesophageal reflux disease)   . Heart murmur    Hx  . High triglycerides   . Migraines   . Migraines   . Rapid heart rate    Social History   Tobacco Use  . Smoking status: Current Every Day Smoker    Packs/day: 2.00    Years: 46.00    Pack years: 92.00    Types: Cigarettes  . Smokeless tobacco: Never Used  . Tobacco comment: .5ppd currently  Vaping Use  . Vaping Use: Never used  Substance Use Topics  . Alcohol use: Not Currently    Alcohol/week: 0.0 standard drinks  . Drug use: No   Allergies  Allergen Reactions  . Diazepam Other (See Comments)    Other reaction(s):  Hallucination Patient states "it makes me crazy" REACTION: Behavioral problems  . Ciprofloxacin Other (See Comments)    Other reaction(s): Unknown Loopy  . Codeine Hives and Itching    REACTION: Dizziness  . Metoprolol     Migraine/headaches   . Morphine Hives    Other reaction(s): Unknown  . Niacin And Related Other (See Comments)    flushing  . Topiramate Other (See Comments)  . Toradol [Ketorolac Tromethamine] Itching    Tolerates when taken with Benadryl  . Doxycycline Diarrhea      Medications: Outpatient Medications Prior to Visit  Medication Sig  . albuterol (PROVENTIL HFA;VENTOLIN HFA) 108 (90 Base) MCG/ACT inhaler Inhale 2 puffs into the lungs every 6 (six) hours as needed for wheezing or shortness of breath.  . ALPRAZolam (XANAX) 0.5 MG tablet Take 1 tablet (0.5 mg total) by mouth 3 (three) times daily as needed for anxiety.  . Ascorbic Acid (VITAMIN C) 1000 MG tablet Take 1,000 mg by mouth daily.  . budesonide-formoterol (SYMBICORT) 160-4.5 MCG/ACT inhaler Inhale 1 puff into the lungs 2 (two) times daily.  . cyclobenzaprine (FLEXERIL) 5 MG tablet Take 1 tablet (5 mg total) by mouth 3 (three) times daily as needed for muscle spasms.  . DULoxetine (CYMBALTA) 20 MG capsule Take 2 capsules (40 mg total) by mouth daily.  Eduard Roux (AIMOVIG) 140 MG/ML SOAJ Inject 140 mg into the skin every 28 (twenty-eight) days.  Marland Kitchen gabapentin (NEURONTIN) 600 MG tablet Take 1 tablet (600 mg total) by mouth at bedtime.  Marland Kitchen HYDROcodone-acetaminophen (NORCO/VICODIN) 5-325 MG tablet Take 1 tablet by mouth every 4 (four) hours as needed for moderate pain or severe pain.  Marland Kitchen loratadine (CLARITIN) 10 MG tablet Take 10 mg by mouth 2 (two) times daily.  . meloxicam (MOBIC) 7.5 MG tablet Take 1 tablet (7.5 mg total) by mouth in the morning and at bedtime.  . Multiple Vitamin (MULTIVITAMIN) tablet Take 1 tablet by mouth daily.  . nicotine (NICODERM CQ - DOSED IN MG/24 HOURS) 21 mg/24hr patch Place 1 patch (21 mg total) onto the skin daily.  . pantoprazole (PROTONIX) 40 MG tablet TAKE 1 TABLET BY MOUTH ONCE A DAY  . Probiotic Product (PROBIOTIC-10) CHEW Chew 2 tablets by mouth daily.  . promethazine (PHENERGAN) 25 MG tablet Take 1 tablet (25 mg total) by mouth every 8 (eight) hours as needed for nausea or vomiting.  Marland Kitchen QUEtiapine (SEROQUEL) 25 MG tablet Take 3 tablets (75 mg total) by mouth at bedtime.  . Rimegepant Sulfate (NURTEC) 75 MG TBDP Take 75 mg by mouth every other day as needed  (migraine).  . simvastatin (ZOCOR) 20 MG tablet Take 1 tablet (20 mg total) by mouth at bedtime.  Marland Kitchen Specialty Vitamins Products (BIOTIN PLUS KERATIN) 10000-100 MCG-MG TABS Take 1 tablet by mouth daily.  . traZODone (DESYREL) 100 MG tablet Take 0.5 tablets (50 mg total) by mouth at bedtime.  . baclofen (LIORESAL) 10 MG tablet Take 1 tablet (10 mg total) by mouth 3 (three) times daily.   No facility-administered medications prior to visit.    Review of Systems  Constitutional: Negative.   Eyes: Positive for photophobia.  Gastrointestinal: Positive for nausea. Negative for abdominal distention, abdominal pain, anal bleeding, blood in stool, constipation, diarrhea, rectal pain and vomiting.  Neurological: Positive for facial asymmetry, light-headedness and headaches. Negative for dizziness, speech difficulty and numbness.      Objective    BP 111/71 (BP Location: Left Arm, Patient Position: Sitting, Cuff Size: Normal)  Pulse 65   Temp 98.5 F (36.9 C) (Oral)   Wt 93 lb (42.2 kg)   BMI 18.16 kg/m    Physical Exam Vitals reviewed.  Constitutional:      General: She is not in acute distress.    Appearance: Normal appearance. She is well-developed, normal weight and well-nourished. She is not ill-appearing.  HENT:     Head: Normocephalic and atraumatic.  Eyes:     Extraocular Movements: EOM normal.  Pulmonary:     Effort: Pulmonary effort is normal. No respiratory distress.  Musculoskeletal:     Cervical back: Normal range of motion and neck supple.  Neurological:     Mental Status: She is alert.     Comments: Left side facial droop consistent with her hemiplegic migraines  Psychiatric:        Mood and Affect: Mood and affect normal.        Behavior: Behavior normal.        Thought Content: Thought content normal.        Judgment: Judgment normal.      No results found for any visits on 01/18/20.  Assessment & Plan     1. Intractable hemiplegic migraine without status  migrainosus No concerning features. Symptoms c/w previous migraines. Migraine cocktail given as below. Sat for 15 minutes and tolerated well. Had driver to take home. Call if worsening.  - promethazine (PHENERGAN) injection 25 mg - ketorolac (TORADOL) injection 60 mg - diphenhydrAMINE (BENADRYL) capsule 50 mg  2. Muscle spasm Improving with flexeril PRN. Diagnosis pulled for medication refill. Continue current medical treatment plan. - cyclobenzaprine (FLEXERIL) 5 MG tablet; Take 1 tablet (5 mg total) by mouth 3 (three) times daily as needed for muscle spasms.  Dispense: 30 tablet; Refill: 5  3. Acute midline low back pain without sciatica See above medical treatment plan. - cyclobenzaprine (FLEXERIL) 5 MG tablet; Take 1 tablet (5 mg total) by mouth 3 (three) times daily as needed for muscle spasms.  Dispense: 30 tablet; Refill: 5   No follow-ups on file.      Reynolds Bowl, PA-C, have reviewed all documentation for this visit. The documentation on 01/18/20 for the exam, diagnosis, procedures, and orders are all accurate and complete.   Rubye Beach  [facility] [phone] (phone) [phone] (fax)  [place]

## 2020-01-18 NOTE — Telephone Encounter (Signed)
Copied from [place] [id number]. Topic: General - Other >> Jan 18, 2020  2:44 PM Rainey Pines A wrote: Patient was advised by triage that they would receive a callback today as soon as possible in regards to patient  Getting an injection today for a migraine. Patient would like a callback today as soon as possible. Please advise  Patient scheduled.

## 2020-01-18 NOTE — Telephone Encounter (Signed)
Patient called stating that she is having migraine headache.  It started this AM.  She has taken hydrocodone but it has not helped.  She is dizzy, has double vision and nausea which are typical migraine symptoms for her.Per protocol patient needs OV today. She states that she need her Migraine injection . No same day visits available. Request will be routed to office for follow up.   Reason for Disposition . [1] SEVERE headache (e.g., excruciating) AND [2] not improved after 2 hours of pain medicine  Answer Assessment - Initial Assessment Questions 1. LOCATION: "Where does it hurt?"      Left side 2. ONSET: "When did the headache start?" (Minutes, hours or days)      This morning 3. PATTERN: "Does the pain come and go, or has it been constant since it started?"   constant 4. SEVERITY: "How bad is the pain?" and "What does it keep you from doing?"  (e.g., Scale 1-10; mild, moderate, or severe)   - MILD (1-3): doesn't interfere with normal activities    - MODERATE (4-7): interferes with normal activities or awakens from sleep    - SEVERE (8-10): excruciating pain, unable to do any normal activities        9 5. RECURRENT SYMPTOM: "Have you ever had headaches before?" If Yes, ask: "When was the last time?" and "What happened that time?"      yes 6. CAUSE: "What do you think is causing the headache?"   Cluster migraine 7. MIGRAINE: "Have you been diagnosed with migraine headaches?" If Yes, ask: "Is this headache similar?"    yes 8. HEAD INJURY: "Has there been any recent injury to the head?"      No 9. OTHER SYMPTOMS: "Do you have any other symptoms?" (fever, stiff neck, eye pain, sore throat, cold symptoms)     Dizzy vision blurry double tripple vision 10. PREGNANCY: "Is there any chance you are pregnant?" "When was your last menstrual period?"      N/A  Protocols used: HEADACHE-A-AH

## 2020-01-27 ENCOUNTER — Encounter: Payer: Self-pay | Admitting: Physician Assistant

## 2020-02-07 ENCOUNTER — Other Ambulatory Visit: Payer: Self-pay | Admitting: Physician Assistant

## 2020-02-07 DIAGNOSIS — E7800 Pure hypercholesterolemia, unspecified: Secondary | ICD-10-CM

## 2020-02-08 NOTE — Telephone Encounter (Signed)
Requested medications are due for refill today yes  Requested medications are on the active medication list yes  Last refill 10/11  Future visit scheduled no  Notes to clinic Failed protocol due to labs more than 360 days ago. No upcoming visit.

## 2020-02-22 ENCOUNTER — Telehealth: Payer: Self-pay | Admitting: *Deleted

## 2020-02-22 NOTE — Telephone Encounter (Signed)
Copied from [place] [id number]. Topic: General - Other >> Feb 22, 2020  9:22 AM Leward Quan A wrote: Reason for CRM: Patient called in to inquire of Fenton Malling about the prior authorization for the [facility] (AIMOVIG) 140 MG/ML SOAJ say that she is having a migraine for 5 days now and really need this medication today please. Can be reached at ph# Erenumab-aooe (AIMOVIG) 140 MG/ML SOAJ

## 2020-02-22 NOTE — Telephone Encounter (Signed)
PA was submitted and pharmacy was notified.

## 2020-02-25 NOTE — Telephone Encounter (Signed)
Has she tried emgality?  They are denying it because of that. If she has we can reapply. If not, we will have to try emgality.

## 2020-02-25 NOTE — Telephone Encounter (Signed)
Rachelle,  Do you know about follow up on this PA?  JB

## 2020-02-25 NOTE — Telephone Encounter (Signed)
Patient called in to say that she spoke with the pharmacy and they do not have the prior authorizatin and she is 4 days past due date for this injection asking for a call back today please at Ph# [phone]

## 2020-02-25 NOTE — Telephone Encounter (Signed)
Patient called and advised of Jennifer's note below. She says she has not tried emgality and that she's been on Aimovig for almost 2 years. She says she has the same [employer]. She asks if Anderson Malta would call her.

## 2020-02-25 NOTE — Telephone Encounter (Signed)
LMTCB 02/25/2020, PEC please advise pt as below.   Thanks,   -Mickel Baas

## 2020-02-25 NOTE — Telephone Encounter (Signed)
Gwenette Greet there's a denial letter on patient's chart from her insurance

## 2020-02-26 ENCOUNTER — Encounter: Payer: Self-pay | Admitting: Physician Assistant

## 2020-02-26 DIAGNOSIS — G43409 Hemiplegic migraine, not intractable, without status migrainosus: Secondary | ICD-10-CM

## 2020-02-26 MED ORDER — EMGALITY 120 MG/ML ~~LOC~~ SOAJ
240.0000 mg | Freq: Once | SUBCUTANEOUS | 0 refills | Status: AC
Start: 2020-02-26 — End: 2020-02-26

## 2020-02-26 MED ORDER — EMGALITY 120 MG/ML ~~LOC~~ SOAJ
120.0000 mg | SUBCUTANEOUS | 10 refills | Status: DC
Start: 2020-02-26 — End: 2020-05-05

## 2020-02-26 NOTE — Telephone Encounter (Signed)
See MyChart message.   Thanks,   -Mickel Baas

## 2020-03-05 ENCOUNTER — Telehealth (INDEPENDENT_AMBULATORY_CARE_PROVIDER_SITE_OTHER): Payer: BC Managed Care – PPO | Admitting: Physician Assistant

## 2020-03-05 ENCOUNTER — Encounter: Payer: Self-pay | Admitting: Physician Assistant

## 2020-03-05 DIAGNOSIS — U071 COVID-19: Secondary | ICD-10-CM | POA: Diagnosis not present

## 2020-03-05 MED ORDER — PREDNISONE 10 MG PO TABS: ORAL_TABLET | ORAL | 0 refills | Status: DC

## 2020-03-05 NOTE — Progress Notes (Signed)
MyChart Video Visit    Virtual Visit via Video Note   This visit type was conducted due to national recommendations for restrictions regarding the COVID-19 Pandemic (e.g. social distancing) in an effort to limit this patient's exposure and mitigate transmission in our community. This patient is at least at moderate risk for complications without adequate follow up. This format is felt to be most appropriate for this patient at this time. Physical exam was limited by quality of the video and audio technology used for the visit.   Patient location: Home Provider location: [facility]  I discussed the limitations of evaluation and management by telemedicine and the availability of in person appointments. The patient expressed understanding and agreed to proceed.  [name]   DOB: 08-24-1955   65 y.o. Female  MRN: [id number] Visit Date: 03/05/2020  Today's healthcare provider: Mar Daring, PA-C   Chief Complaint  Patient presents with  . Covid Positive   Subjective    HPI  Pt states tested positive for Covid 02/29/2020. She reports her symptoms started on 02/28/20. She started with chills and fever. Then developed body aches. This lasted Thursday and Friday. She has had nausea and headaches throughout. Does have some laryngitis and chest tightness that just started yesterday and today.  Patient Active Problem List   Diagnosis Date Noted  . Dysplastic nevus 06/08/2019  . Abdominal pain 10/16/2018  . Abnormal CT scan, colon 10/16/2018  . Personal history of tobacco use, presenting hazards to health 10/03/2018  . Arm pain 12/05/2017  . Centrilobular emphysema ([place]) 12/04/2017  . SOB (shortness of breath) 03/21/2017  . Premature ventricular contractions 10/21/2016  . Heart palpitations 10/05/2016  . Cyst of right ovary 08/18/2016  . Adnexal mass 08/17/2016  . Bilateral carpal tunnel syndrome 04/08/2016  . Bilateral hand pain 04/08/2016  .  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 Acute onset aura migraine 07/10/2014  . Bad memory 07/10/2014  . Migraine without aura 07/10/2014  . Abdominal pain, epigastric 07/10/2014  . Chronic constipation 07/10/2014  . Weakness of left side of body 11/28/2011  . Acute anxiety 11/20/2009  . Anxiety and depression 11/20/2009  . Edema of extremities 11/20/2009  . Hypercholesterolemia without hypertriglyceridemia 06/18/2009  . 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 Malaise and fatigue 10/29/2008  . Weight loss, unintentional 10/29/2008  . Mixed hyperlipidemia 08/22/2008  . 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 Affective bipolar disorder ([place]) 05/17/2008  . Barrett esophagus 05/17/2008   Past Medical History:  Diagnosis Date  . Actinic keratosis   . Adnexal mass   . Anxiety   . Barrett's esophagus   . Bell palsy    left foot also ,   . Bronchitis   . Complication of anesthesia    difficulty waking up  . COPD (chronic obstructive pulmonary disease) ([place])   . Degenerative disc disease, cervical   . Depression   . Dysplastic nevus 06/08/2019   Right lower abdomen, moderate atypia, limited margins free.  Marland Kitchen Dysplastic nevus 12/06/2018   right lower abdomen/moderate, limited margins free.  . Environmental and seasonal allergies   . GERD (gastroesophageal reflux disease)   . Heart murmur    Hx  . High triglycerides   . Migraines   . Migraines   . Rapid heart rate    Social History   Tobacco Use  . Smoking status: Current Every Day Smoker    Packs/day: 2.00    Years: 46.00    Pack  years: 92.00    Types: Cigarettes  . Smokeless tobacco: Never Used  . Tobacco comment: .5ppd currently  Vaping Use  . Vaping Use: Never used  Substance Use Topics  . Alcohol use: Not Currently    Alcohol/week: 0.0 standard drinks  . Drug use: No   Allergies  Allergen Reactions  . Diazepam Other (See Comments)    Other reaction(s): Hallucination Patient states "it makes me  crazy" REACTION: Behavioral problems  . Ciprofloxacin Other (See Comments)    Other reaction(s): Unknown Loopy  . Codeine Hives and Itching    REACTION: Dizziness  . Metoprolol     Migraine/headaches   . Morphine Hives    Other reaction(s): Unknown  . Niacin And Related Other (See Comments)    flushing  . Topiramate Other (See Comments)  . Toradol [Ketorolac Tromethamine] Itching    Tolerates when taken with Benadryl  . Doxycycline Diarrhea    Medications: Outpatient Medications Prior to Visit  Medication Sig  . albuterol (PROVENTIL HFA;VENTOLIN HFA) 108 (90 Base) MCG/ACT inhaler Inhale 2 puffs into the lungs every 6 (six) hours as needed for wheezing or shortness of breath.  . ALPRAZolam (XANAX) 0.5 MG tablet Take 1 tablet (0.5 mg total) by mouth 3 (three) times daily as needed for anxiety.  . Ascorbic Acid (VITAMIN C) 1000 MG tablet Take 1,000 mg by mouth daily.  . baclofen (LIORESAL) 10 MG tablet Take 1 tablet (10 mg total) by mouth 3 (three) times daily.  . budesonide-formoterol (SYMBICORT) 160-4.5 MCG/ACT inhaler Inhale 1 puff into the lungs 2 (two) times daily.  . cyclobenzaprine (FLEXERIL) 5 MG tablet Take 1 tablet (5 mg total) by mouth 3 (three) times daily as needed for muscle spasms.  . DULoxetine (CYMBALTA) 20 MG capsule Take 2 capsules (40 mg total) by mouth daily.  Eduard Roux (AIMOVIG) 140 MG/ML SOAJ Inject 140 mg into the skin every 28 (twenty-eight) days.  Marland Kitchen gabapentin (NEURONTIN) 600 MG tablet Take 1 tablet (600 mg total) by mouth at bedtime.  . Galcanezumab-gnlm (EMGALITY) 120 MG/ML SOAJ Inject 120 mg into the skin every 30 (thirty) days. For continuing after loading dose  . HYDROcodone-acetaminophen (NORCO/VICODIN) 5-325 MG tablet Take 1 tablet by mouth every 4 (four) hours as needed for moderate pain or severe pain.  Marland Kitchen loratadine (CLARITIN) 10 MG tablet Take 10 mg by mouth 2 (two) times daily.  . meloxicam (MOBIC) 7.5 MG tablet Take 1 tablet (7.5 mg total)  by mouth in the morning and at bedtime.  . Multiple Vitamin (MULTIVITAMIN) tablet Take 1 tablet by mouth daily.  . nicotine (NICODERM CQ - DOSED IN MG/24 HOURS) 21 mg/24hr patch Place 1 patch (21 mg total) onto the skin daily.  . pantoprazole (PROTONIX) 40 MG tablet TAKE 1 TABLET BY MOUTH ONCE A DAY  . Probiotic Product (PROBIOTIC-10) CHEW Chew 2 tablets by mouth daily.  . promethazine (PHENERGAN) 25 MG tablet Take 1 tablet (25 mg total) by mouth every 8 (eight) hours as needed for nausea or vomiting.  Marland Kitchen QUEtiapine (SEROQUEL) 25 MG tablet Take 3 tablets (75 mg total) by mouth at bedtime.  . Rimegepant Sulfate (NURTEC) 75 MG TBDP Take 75 mg by mouth every other day as needed (migraine).  . simvastatin (ZOCOR) 20 MG tablet TAKE ONE TABLET BY MOUTH AT BEDTIME  . Specialty Vitamins Products (BIOTIN PLUS KERATIN) 10000-100 MCG-MG TABS Take 1 tablet by mouth daily.  . traZODone (DESYREL) 100 MG tablet Take 0.5 tablets (50 mg total) by mouth at  bedtime.   No facility-administered medications prior to visit.    Review of Systems  Constitutional: Positive for chills and fatigue. Negative for activity change, appetite change, diaphoresis, fever and unexpected weight change.  HENT: Positive for congestion, postnasal drip, rhinorrhea, sinus pressure, sinus pain and sore throat. Negative for ear discharge, ear pain and hearing loss.   Eyes: Positive for itching. Negative for photophobia, pain, discharge, redness and visual disturbance.  Respiratory: Positive for cough, chest tightness and wheezing. Negative for apnea, choking, shortness of breath and stridor.   Cardiovascular: Negative for chest pain, palpitations and leg swelling.  Gastrointestinal: Positive for nausea. Negative for abdominal distention, abdominal pain, anal bleeding, blood in stool, constipation, diarrhea, rectal pain and vomiting.  Neurological: Positive for headaches. Negative for dizziness and light-headedness.      Objective     There were no vitals taken for this visit.   Physical Exam Vitals reviewed.  Constitutional:      Appearance: Normal appearance. She is well-developed and well-nourished. She is ill-appearing.  HENT:     Head: Normocephalic and atraumatic.  Eyes:     Extraocular Movements: EOM normal.  Pulmonary:     Effort: Pulmonary effort is normal. No respiratory distress.  Musculoskeletal:     Cervical back: Normal range of motion and neck supple.  Neurological:     Mental Status: She is alert.  Psychiatric:        Mood and Affect: Mood and affect and mood normal.        Behavior: Behavior normal.        Thought Content: Thought content normal.        Judgment: Judgment normal.       Assessment & Plan     1. COVID-19 Worsening chest tightness. NO difficulty breathing or SOB. Will treat with prednisone as below. Other treatment options OTC were sent to patient via AVS in mychart. She is to call if symptoms worsen or change.  - predniSONE (DELTASONE) 10 MG tablet; Take 6 tablets PO on day 1 and day 2, take 5 tablets PO on day 3 and day 4, take 4 tablets PO on day 5 and day 6, take 3 tablets PO on day 7 and day 8, take 2 tablets PO on day 9 and day 10, take one tablet PO on day 11 and day 12.  Dispense: 42 tablet; Refill: 0   No follow-ups on file.     I discussed the assessment and treatment plan with the patient. The patient was provided an opportunity to ask questions and all were answered. The patient agreed with the plan and demonstrated an understanding of the instructions.   The patient was advised to call back or seek an in-person evaluation if the symptoms worsen or if the condition fails to improve as anticipated.  I provided 8 minutes of face-to-face time during this encounter via MyChart Video enabled encounter.  Reynolds Bowl, PA-C, have reviewed all documentation for this visit. The documentation on 03/05/20 for the exam, diagnosis, procedures, and orders are all  accurate and complete.  Rubye Beach [facility] [phone] (phone) [phone] (fax)  [place]

## 2020-03-05 NOTE — Patient Instructions (Signed)
Can take to lessen severity: Vit C 500mg  twice daily Quercertin 250-500mg  twice daily Zinc 75-100mg  daily Melatonin 3-6 mg at bedtime Vit D3 1000-2000 IU daily Aspirin 81 mg daily with food Optional: Famotidine 20mg  daily Also can add tylenol/ibuprofen as needed for fevers and body aches May add Mucinex or Mucinex DM as needed for cough/congestion  COVID-19: What to Do if You Are Sick If you have a fever, cough or other symptoms, you might have COVID-19. Most people have mild illness and are able to recover at home. If you are sick:  Keep track of your symptoms.  If you have an emergency warning sign (including trouble breathing), call 911. Steps to help prevent the spread of COVID-19 if you are sick If you are sick with COVID-19 or think you might have COVID-19, follow the steps below to care for yourself and to help protect other people in your home and community. Stay home except to get medical care  Stay home. Most people with COVID-19 have mild illness and can recover at home without medical care. Do not leave your home, except to get medical care. Do not visit public areas.  Take care of yourself. Get rest and stay hydrated. Take over-the-counter medicines, such as acetaminophen, to help you feel better.  Stay in touch with your doctor. Call before you get medical care. Be sure to get care if you have trouble breathing, or have any other emergency warning signs, or if you think it is an emergency.  Avoid public transportation, ride-sharing, or taxis. Separate yourself from other people As much as possible, stay in a specific room and away from other people and pets in your home. If possible, you should use a separate bathroom. If you need to be around other people or animals in or outside of the home, wear a mask. Tell your close contactsthat they may have been exposed to COVID-19. An infected person can spread COVID-19 starting 48 hours (or 2 days) before the person has any  symptoms or tests positive. By letting your close contacts know they may have been exposed to COVID-19, you are helping to protect everyone.  Additional guidance is available for those living in close quarters and shared housing.  See COVID-19 and Animals if you have questions about pets.  If you are diagnosed with COVID-19, someone from the health department may call you. Answer the call to slow the spread. Monitor your symptoms  Symptoms of COVID-19 include fever, cough, or other symptoms.  Follow care instructions from your healthcare provider and local health department. Your local health authorities may give instructions on checking your symptoms and reporting information. When to seek emergency medical attention Look for emergency warning signs* for COVID-19. If someone is showing any of these signs, seek emergency medical care immediately:  Trouble breathing  Persistent pain or pressure in the chest  New confusion  Inability to wake or stay awake  Pale, gray, or blue-colored skin, lips, or nail beds, depending on skin tone *This list is not all possible symptoms. Please call your medical provider for any other symptoms that are severe or concerning to you. Call 911 or call ahead to your local emergency facility: Notify the operator that you are seeking care for someone who has or may have COVID-19. Call ahead before visiting your doctor  Call ahead. Many medical visits for routine care are being postponed or done by phone or telemedicine.  If you have a medical appointment that cannot be postponed, call your  doctor's office, and tell them you have or may have COVID-19. This will help the office protect themselves and other patients. Get  tested  If you have symptoms of COVID-19, get tested. While waiting for test results, you stay away from others, including staying apart from those living in your household.  You can visit your state, tribal, local, and territorialhealth  department's website to look for the latest local information on testing sites. If you are sick, wear a mask over your nose and mouth  You should wear a mask over your nose and mouth if you must be around other people or animals, including pets (even at home).  You don't need to wear the mask if you are alone. If you can't put on a mask (because of trouble breathing, for example), cover your coughs and sneezes in some other way. Try to stay at least 6 feet away from other people. This will help protect the people around you.  Masks should not be placed on young children under age 82 years, anyone who has trouble breathing, or anyone who is not able to remove the mask without help. Note: During the COVID-19 pandemic, medical grade facemasks are reserved for healthcare workers and some first responders. Cover your coughs and sneezes  Cover your mouth and nose with a tissue when you cough or sneeze.  Throw away used tissues in a lined trash can.  Immediately wash your hands with soap and water for at least 20 seconds. If soap and water are not available, clean your hands with an alcohol-based hand sanitizer that contains at least 60% alcohol. Clean your hands often  Wash your hands often with soap and water for at least 20 seconds. This is especially important after blowing your nose, coughing, or sneezing; going to the bathroom; and before eating or preparing food.  Use hand sanitizer if soap and water are not available. Use an alcohol-based hand sanitizer with at least 60% alcohol, covering all surfaces of your hands and rubbing them together until they feel dry.  Soap and water are the best option, especially if hands are visibly dirty.  Avoid touching your eyes, nose, and mouth with unwashed hands.  Handwashing Tips Avoid sharing personal household items  Do not share dishes, drinking glasses, cups, eating utensils, towels, or bedding with other people in your home.  Wash these items  thoroughly after using them with soap and water or put in the dishwasher. Clean all "high-touch" surfaces everyday  Clean and disinfect high-touch surfaces in your "sick room" and bathroom; wear disposable gloves. Let someone else clean and disinfect surfaces in common areas, but you should clean your bedroom and bathroom, if possible.  If a caregiver or other person needs to clean and disinfect a sick person's bedroom or bathroom, they should do so on an as-needed basis. The caregiver/other person should wear a mask and disposable gloves prior to cleaning. They should wait as long as possible after the person who is sick has used the bathroom before coming in to clean and use the bathroom. ? High-touch surfaces include phones, remote controls, counters, tabletops, doorknobs, bathroom fixtures, toilets, keyboards, tablets, and bedside tables.  Clean and disinfect areas that may have blood, stool, or body fluids on them.  Use household cleaners and disinfectants. Clean the area or item with soap and water or another detergent if it is dirty. Then, use a household disinfectant. ? Be sure to follow the instructions on the label to ensure safe and effective  use of the product. Many products recommend keeping the surface wet for several minutes to ensure germs are killed. Many also recommend precautions such as wearing gloves and making sure you have good ventilation during use of the product. ? Use a product from EPA's List N: Disinfectants for Coronavirus (COVID-19). ? Complete Disinfection Guidance When you can be around others after being sick with COVID-19 Deciding when you can be around others is different for different situations. Find out when you can safely end home isolation. For any additional questions about your care, contact your healthcare provider or state or local health department. 04/18/2019 Content source: National Center for Immunization and Respiratory Diseases (NCIRD), Division of  Viral Diseases This information is not intended to replace advice given to you by your health care provider. Make sure you discuss any questions you have with your health care provider. Document Revised: 12/03/2019 Document Reviewed: 12/03/2019 Elsevier Patient Education  2021 Elsevier Inc.  

## 2020-03-11 ENCOUNTER — Encounter: Payer: Self-pay | Admitting: Physician Assistant

## 2020-03-11 MED ORDER — MIRTAZAPINE 45 MG PO TABS: 45.0000 mg | ORAL_TABLET | Freq: Every day | ORAL | 3 refills | Status: DC

## 2020-03-19 ENCOUNTER — Other Ambulatory Visit: Payer: Self-pay | Admitting: Physician Assistant

## 2020-03-19 DIAGNOSIS — K2270 Barrett's esophagus without dysplasia: Secondary | ICD-10-CM

## 2020-03-29 IMAGING — CR DG RIBS 2V*L*
1 series · 2 of 2 positions shown · non-contrast
Comparison: Chest radiographs dated 03/21/2017.

CLINICAL DATA: Left anterior lower rib pain after hearing a popping
sound when bending over the side of a baby's crib. Smoker.

EXAM:
LEFT RIBS - 2 VIEW

[Series 1: dg ribs unilateral left · 0.14mm/px · 2 of 2 slices shown]
[im 1/2]
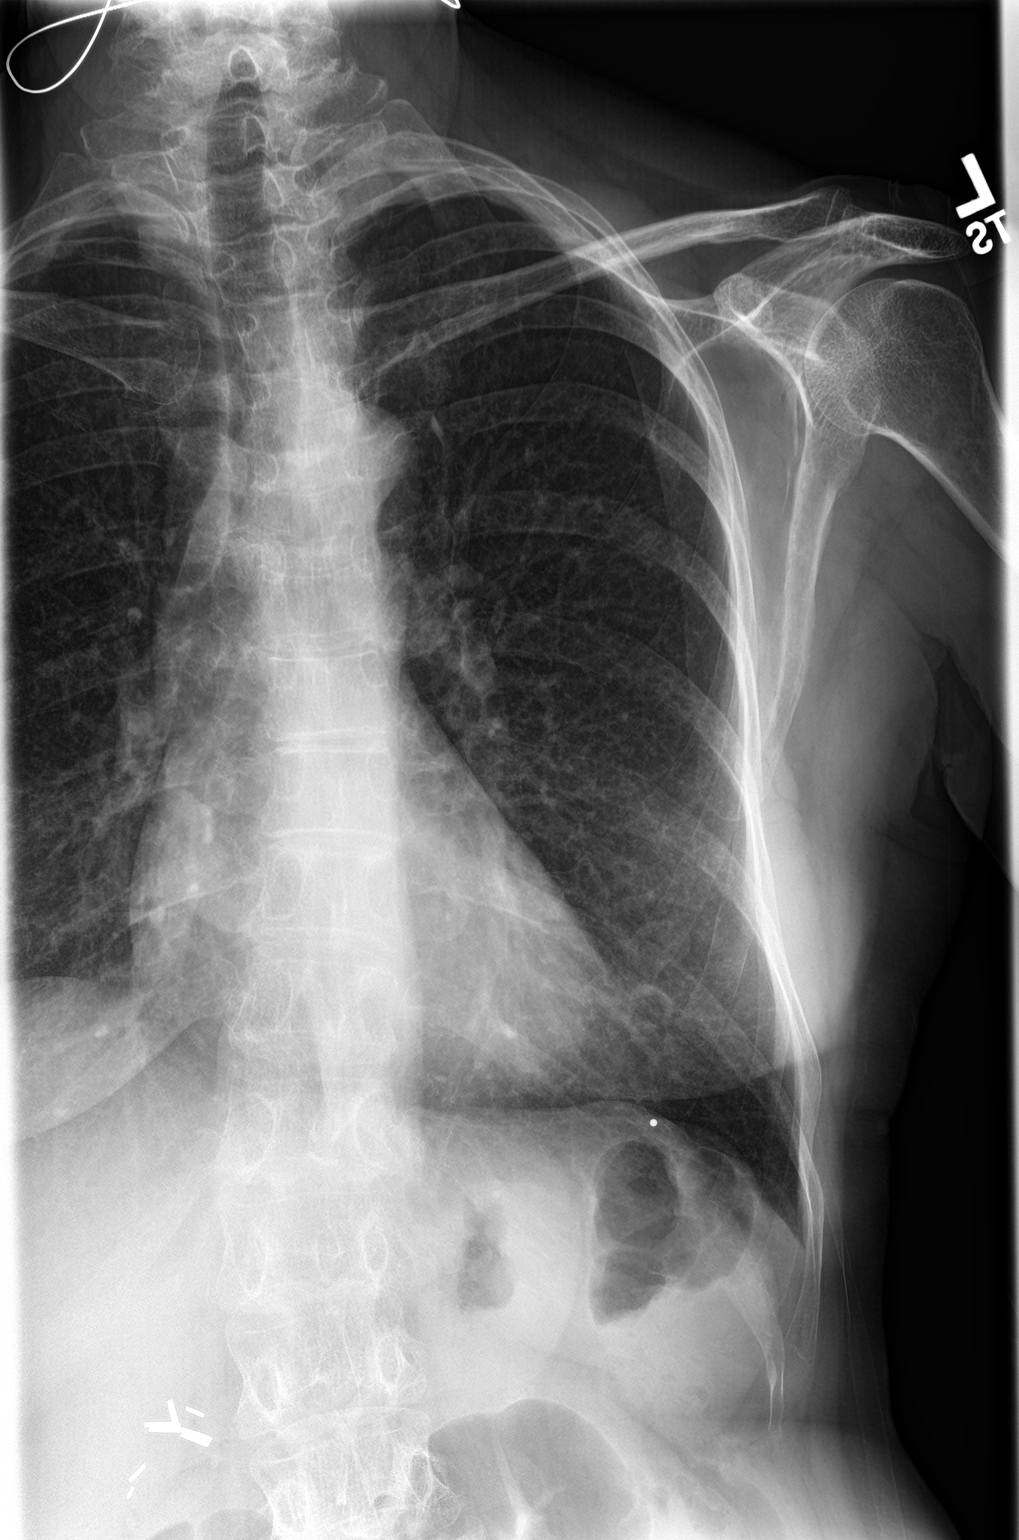
[im 2/2]
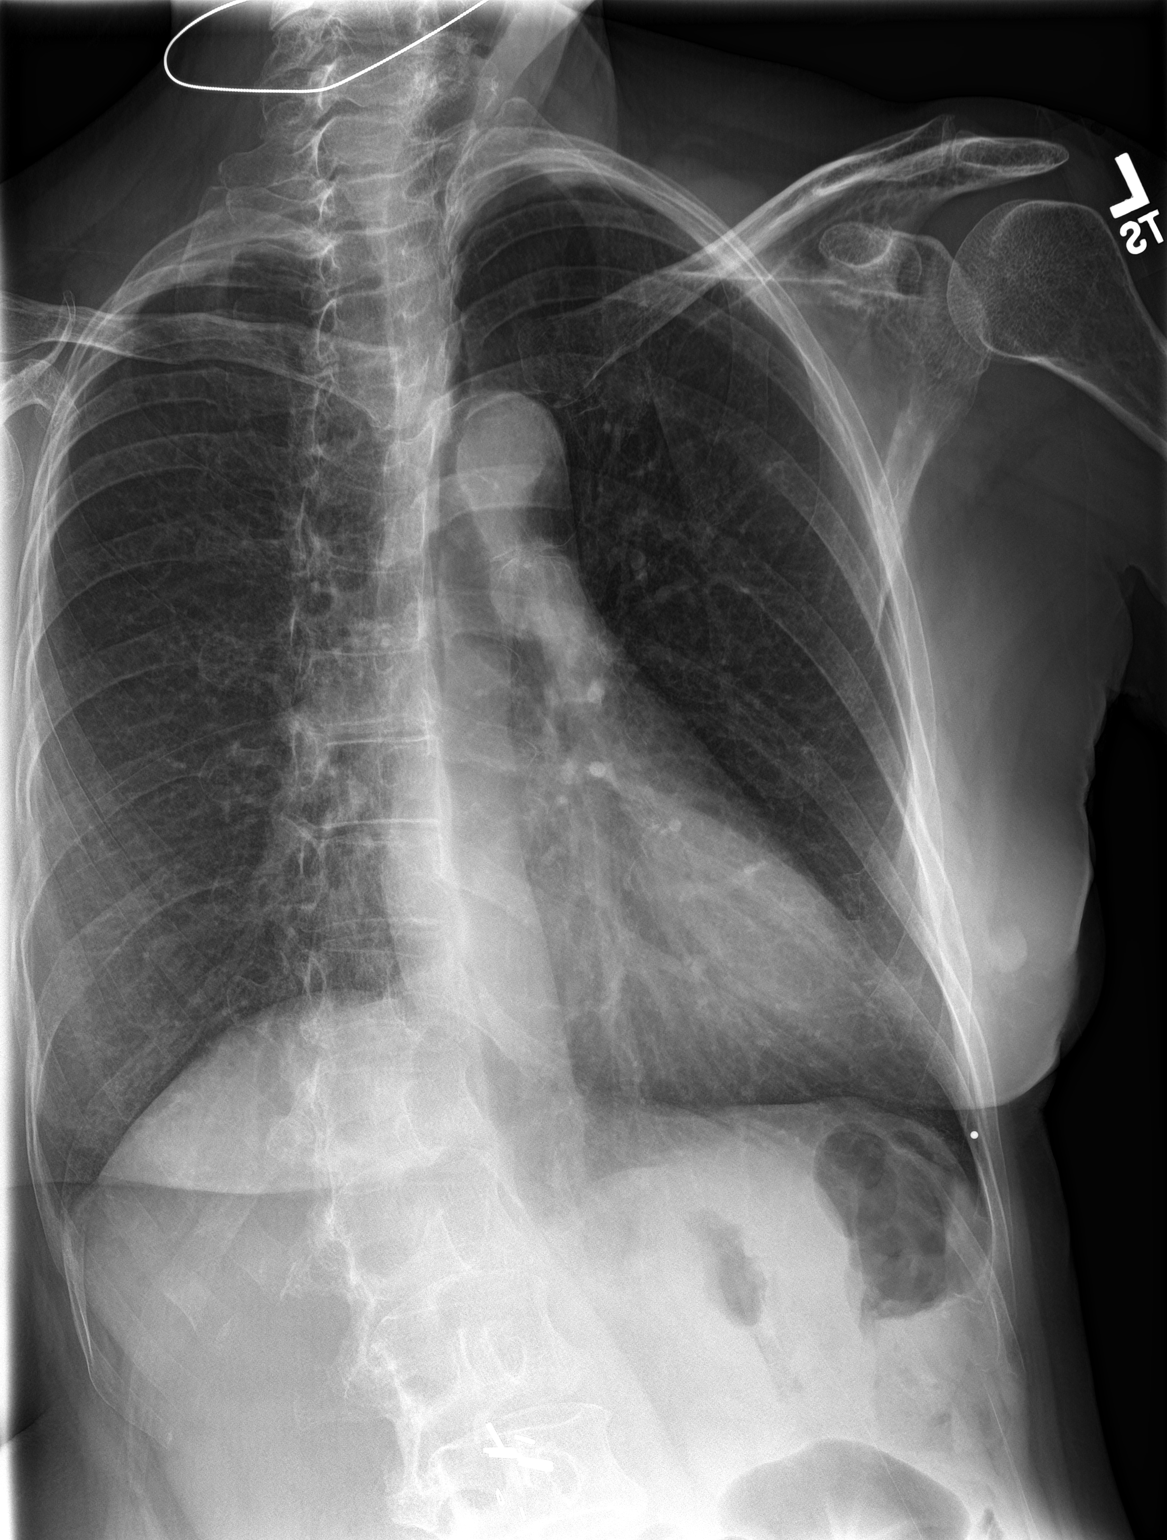

[2 of 2 positions shown; findings below may reference images not displayed]

FINDINGS: Two views of the left ribs are submitted for interpretation. Diffuse
osteopenia is noted with no visible rib fracture or pneumothorax on
these views. The heart is grossly normal in size. The lungs remain
clear and hyperexpanded with mildly prominent interstitial markings.
IMPRESSION: 1. Two views of the left ribs demonstrate no visible fracture.
2. Stable changes of COPD.

## 2020-04-21 ENCOUNTER — Ambulatory Visit: Payer: BC Managed Care – PPO | Admitting: Physician Assistant

## 2020-04-21 ENCOUNTER — Other Ambulatory Visit: Payer: Self-pay

## 2020-04-21 ENCOUNTER — Encounter: Payer: Self-pay | Admitting: Physician Assistant

## 2020-04-21 VITALS — BP 112/59 | HR 68 | Temp 98.6°F | Wt 98.0 lb

## 2020-04-21 DIAGNOSIS — F39 Unspecified mood [affective] disorder: Secondary | ICD-10-CM

## 2020-04-21 DIAGNOSIS — F339 Major depressive disorder, recurrent, unspecified: Secondary | ICD-10-CM

## 2020-04-21 DIAGNOSIS — E7800 Pure hypercholesterolemia, unspecified: Secondary | ICD-10-CM

## 2020-04-21 DIAGNOSIS — K581 Irritable bowel syndrome with constipation: Secondary | ICD-10-CM | POA: Diagnosis not present

## 2020-04-21 DIAGNOSIS — G43409 Hemiplegic migraine, not intractable, without status migrainosus: Secondary | ICD-10-CM

## 2020-04-21 DIAGNOSIS — M62838 Other muscle spasm: Secondary | ICD-10-CM

## 2020-04-21 DIAGNOSIS — R634 Abnormal weight loss: Secondary | ICD-10-CM

## 2020-04-21 DIAGNOSIS — G43009 Migraine without aura, not intractable, without status migrainosus: Secondary | ICD-10-CM

## 2020-04-21 DIAGNOSIS — M545 Low back pain, unspecified: Secondary | ICD-10-CM

## 2020-04-21 DIAGNOSIS — F3175 Bipolar disorder, in partial remission, most recent episode depressed: Secondary | ICD-10-CM

## 2020-04-21 DIAGNOSIS — G8929 Other chronic pain: Secondary | ICD-10-CM

## 2020-04-21 MED ORDER — BACLOFEN 10 MG PO TABS: 10.0000 mg | ORAL_TABLET | Freq: Three times a day (TID) | ORAL | 3 refills | Status: AC

## 2020-04-21 MED ORDER — QUETIAPINE FUMARATE 25 MG PO TABS: 75.0000 mg | ORAL_TABLET | Freq: Every day | ORAL | 3 refills | Status: DC

## 2020-04-21 MED ORDER — MELOXICAM 7.5 MG PO TABS: 7.5000 mg | ORAL_TABLET | Freq: Two times a day (BID) | ORAL | 5 refills | Status: DC

## 2020-04-21 MED ORDER — TRAZODONE HCL 100 MG PO TABS: 50.0000 mg | ORAL_TABLET | Freq: Every day | ORAL | 3 refills | Status: DC

## 2020-04-21 MED ORDER — HYDROCODONE-ACETAMINOPHEN 5-325 MG PO TABS: 1.0000 | ORAL_TABLET | ORAL | 0 refills | Status: AC | PRN

## 2020-04-21 MED ORDER — GABAPENTIN 600 MG PO TABS
600.0000 mg | ORAL_TABLET | Freq: Every day | ORAL | 1 refills | Status: DC
Start: 2020-04-21 — End: 2021-04-06

## 2020-04-21 MED ORDER — CYCLOBENZAPRINE HCL 5 MG PO TABS
5.0000 mg | ORAL_TABLET | Freq: Three times a day (TID) | ORAL | 5 refills | Status: AC | PRN
Start: 2020-04-21 — End: ?

## 2020-04-21 MED ORDER — DULOXETINE HCL 20 MG PO CPEP: 40.0000 mg | ORAL_CAPSULE | Freq: Every day | ORAL | 3 refills | Status: DC

## 2020-04-21 NOTE — Progress Notes (Signed)
Established patient visit   [name]   DOB: 10-30-1955   65 y.o. Female  MRN: [id number] Visit Date: 04/21/2020  Today's healthcare provider: Mar Daring, PA-C   No chief complaint on file.  Subjective    HPI  Pt is coming in today to follow up on medications before providers leaves.  She would like refills on all of her medicines.  She states she feels well today with no complaints.   Patient Active Problem List   Diagnosis Date Noted  . Dysplastic nevus 06/08/2019  . Abdominal pain 10/16/2018  . Abnormal CT scan, colon 10/16/2018  . Personal history of tobacco use, presenting hazards to health 10/03/2018  . Arm pain 12/05/2017  . Centrilobular emphysema ([place]) 12/04/2017  . SOB (shortness of breath) 03/21/2017  . Premature ventricular contractions 10/21/2016  . Heart palpitations 10/05/2016  . Cyst of right ovary 08/18/2016  . Adnexal mass 08/17/2016  . Bilateral carpal tunnel syndrome 04/08/2016  . Bilateral hand pain 04/08/2016  . 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 Acute onset aura migraine 07/10/2014  . Bad memory 07/10/2014  . Migraine without aura 07/10/2014  . Abdominal pain, epigastric 07/10/2014  . Chronic constipation 07/10/2014  . Weakness of left side of body 11/28/2011  . Acute anxiety 11/20/2009  . Anxiety and depression 11/20/2009  . Edema of extremities 11/20/2009  . Hypercholesterolemia without hypertriglyceridemia 06/18/2009  . 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 Malaise and fatigue 10/29/2008  . Weight loss, unintentional 10/29/2008  . Mixed hyperlipidemia 08/22/2008  . 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 Affective bipolar disorder ([place]) 05/17/2008  . Barrett esophagus 05/17/2008   Past Medical History:  Diagnosis Date  . Actinic keratosis   . Adnexal mass   . Anxiety   . Barrett's esophagus   . Bell palsy    left foot also ,   . Bronchitis   . Complication of anesthesia    difficulty waking  up  . COPD (chronic obstructive pulmonary disease) ([place])   . Degenerative disc disease, cervical   . Depression   . Dysplastic nevus 06/08/2019   Right lower abdomen, moderate atypia, limited margins free.  Marland Kitchen Dysplastic nevus 12/06/2018   right lower abdomen/moderate, limited margins free.  . Environmental and seasonal allergies   . GERD (gastroesophageal reflux disease)   . Heart murmur    Hx  . High triglycerides   . Migraines   . Migraines   . Rapid heart rate    Social History   Tobacco Use  . Smoking status: Current Every Day Smoker    Packs/day: 2.00    Years: 46.00    Pack years: 92.00    Types: Cigarettes  . Smokeless tobacco: Never Used  . Tobacco comment: .5ppd currently  Vaping Use  . Vaping Use: Never used  Substance Use Topics  . Alcohol use: Not Currently    Alcohol/week: 0.0 standard drinks  . Drug use: No   Allergies  Allergen Reactions  . Diazepam Other (See Comments)    Other reaction(s): Hallucination Patient states "it makes me crazy" REACTION: Behavioral problems  . Ciprofloxacin Other (See Comments)    Other reaction(s): Unknown Loopy  . Codeine Hives and Itching    REACTION: Dizziness  . Metoprolol     Migraine/headaches   . Morphine Hives    Other reaction(s): Unknown  . Niacin And Related Other (See Comments)    flushing  . Topiramate Other (See Comments)  . Toradol [  Ketorolac Tromethamine] Itching    Tolerates when taken with Benadryl  . Doxycycline Diarrhea     Medications: Outpatient Medications Prior to Visit  Medication Sig  . Abaloparatide (TYMLOS) 3120 MCG/1.56ML SOPN Inject into the skin.  Marland Kitchen albuterol (PROVENTIL HFA;VENTOLIN HFA) 108 (90 Base) MCG/ACT inhaler Inhale 2 puffs into the lungs every 6 (six) hours as needed for wheezing or shortness of breath.  . ALPRAZolam (XANAX) 0.5 MG tablet Take 1 tablet (0.5 mg total) by mouth 3 (three) times daily as needed for anxiety.  . Ascorbic Acid (VITAMIN C) 1000 MG tablet Take  1,000 mg by mouth daily.  . baclofen (LIORESAL) 10 MG tablet Take 1 tablet (10 mg total) by mouth 3 (three) times daily.  . budesonide-formoterol (SYMBICORT) 160-4.5 MCG/ACT inhaler Inhale 1 puff into the lungs 2 (two) times daily.  . cyclobenzaprine (FLEXERIL) 5 MG tablet Take 1 tablet (5 mg total) by mouth 3 (three) times daily as needed for muscle spasms.  . DULoxetine (CYMBALTA) 20 MG capsule Take 2 capsules (40 mg total) by mouth daily.  Marland Kitchen gabapentin (NEURONTIN) 600 MG tablet Take 1 tablet (600 mg total) by mouth at bedtime.  . Galcanezumab-gnlm (EMGALITY) 120 MG/ML SOAJ Inject 120 mg into the skin every 30 (thirty) days. For continuing after loading dose  . HYDROcodone-acetaminophen (NORCO/VICODIN) 5-325 MG tablet Take 1 tablet by mouth every 4 (four) hours as needed for moderate pain or severe pain.  Marland Kitchen loratadine (CLARITIN) 10 MG tablet Take 10 mg by mouth 2 (two) times daily.  . meloxicam (MOBIC) 7.5 MG tablet Take 1 tablet (7.5 mg total) by mouth in the morning and at bedtime.  . mirtazapine (REMERON) 45 MG tablet Take 1 tablet (45 mg total) by mouth at bedtime.  . Multiple Vitamin (MULTIVITAMIN) tablet Take 1 tablet by mouth daily.  . pantoprazole (PROTONIX) 40 MG tablet TAKE 1 TABLET BY MOUTH ONCE A DAY  . Probiotic Product (PROBIOTIC-10) CHEW Chew 2 tablets by mouth daily.  . promethazine (PHENERGAN) 25 MG tablet Take 1 tablet (25 mg total) by mouth every 8 (eight) hours as needed for nausea or vomiting.  Marland Kitchen QUEtiapine (SEROQUEL) 25 MG tablet Take 3 tablets (75 mg total) by mouth at bedtime.  . Rimegepant Sulfate (NURTEC) 75 MG TBDP Take 75 mg by mouth every other day as needed (migraine).  . simvastatin (ZOCOR) 20 MG tablet TAKE ONE TABLET BY MOUTH AT BEDTIME  . Specialty Vitamins Products (BIOTIN PLUS KERATIN) 10000-100 MCG-MG TABS Take 1 tablet by mouth daily.  . traZODone (DESYREL) 100 MG tablet Take 0.5 tablets (50 mg total) by mouth at bedtime.  Eduard Roux (AIMOVIG) 140  MG/ML SOAJ Inject 140 mg into the skin every 28 (twenty-eight) days.  . nicotine (NICODERM CQ - DOSED IN MG/24 HOURS) 21 mg/24hr patch Place 1 patch (21 mg total) onto the skin daily.  . predniSONE (DELTASONE) 10 MG tablet Take 6 tablets PO on day 1 and day 2, take 5 tablets PO on day 3 and day 4, take 4 tablets PO on day 5 and day 6, take 3 tablets PO on day 7 and day 8, take 2 tablets PO on day 9 and day 10, take one tablet PO on day 11 and day 12.   No facility-administered medications prior to visit.    Review of Systems  Constitutional: Negative.   Respiratory: Negative.   Cardiovascular: Negative.   Gastrointestinal: Negative.   Neurological: Positive for headaches (Pt has a history of migraines but says this  stable. ). Negative for dizziness and light-headedness.        Objective    BP (!) 112/59 (BP Location: Right Arm, Patient Position: Sitting, Cuff Size: Normal)   Pulse 68   Temp 98.6 F (37 C) (Oral)   Wt 98 lb (44.5 kg)   SpO2 100%   BMI 19.14 kg/m    Physical Exam Vitals reviewed.  Constitutional:      General: She is not in acute distress.    Appearance: Normal appearance. She is well-developed and normal weight. She is not ill-appearing.  HENT:     Head: Normocephalic and atraumatic.  Pulmonary:     Effort: Pulmonary effort is normal. No respiratory distress.  Musculoskeletal:     Cervical back: Normal range of motion and neck supple.  Neurological:     Mental Status: She is alert.  Psychiatric:        Mood and Affect: Mood normal.        Behavior: Behavior normal.        Thought Content: Thought content normal.        Judgment: Judgment normal.     No results found for any visits on 04/21/20.  Assessment & Plan     1. Hypercholesterolemia without hypertriglyceridemia Stable on simvastatin 20mg .  2. Irritable bowel syndrome with constipation Stable. Diagnosis pulled for medication refill. Continue current medical treatment plan. - baclofen  (LIORESAL) 10 MG tablet; Take 1 tablet (10 mg total) by mouth 3 (three) times daily.  Dispense: 270 tablet; Refill: 3 - DULoxetine (CYMBALTA) 20 MG capsule; Take 2 capsules (40 mg total) by mouth daily.  Dispense: 180 capsule; Refill: 3 - QUEtiapine (SEROQUEL) 25 MG tablet; Take 3 tablets (75 mg total) by mouth at bedtime.  Dispense: 270 tablet; Refill: 3  3. Depression, recurrent ([place]) Stable. Diagnosis pulled for medication refill. Continue current medical treatment plan. - baclofen (LIORESAL) 10 MG tablet; Take 1 tablet (10 mg total) by mouth 3 (three) times daily.  Dispense: 270 tablet; Refill: 3 - DULoxetine (CYMBALTA) 20 MG capsule; Take 2 capsules (40 mg total) by mouth daily.  Dispense: 180 capsule; Refill: 3 - QUEtiapine (SEROQUEL) 25 MG tablet; Take 3 tablets (75 mg total) by mouth at bedtime.  Dispense: 270 tablet; Refill: 3  4. Weight loss Stable. Diagnosis pulled for medication refill. Continue current medical treatment plan. - baclofen (LIORESAL) 10 MG tablet; Take 1 tablet (10 mg total) by mouth 3 (three) times daily.  Dispense: 270 tablet; Refill: 3 - DULoxetine (CYMBALTA) 20 MG capsule; Take 2 capsules (40 mg total) by mouth daily.  Dispense: 180 capsule; Refill: 3 - QUEtiapine (SEROQUEL) 25 MG tablet; Take 3 tablets (75 mg total) by mouth at bedtime.  Dispense: 270 tablet; Refill: 3  5. Mood disorder ([facility]) Stable. Diagnosis pulled for medication refill. Continue current medical treatment plan. - baclofen (LIORESAL) 10 MG tablet; Take 1 tablet (10 mg total) by mouth 3 (three) times daily.  Dispense: 270 tablet; Refill: 3 - DULoxetine (CYMBALTA) 20 MG capsule; Take 2 capsules (40 mg total) by mouth daily.  Dispense: 180 capsule; Refill: 3 - QUEtiapine (SEROQUEL) 25 MG tablet; Take 3 tablets (75 mg total) by mouth at bedtime.  Dispense: 270 tablet; Refill: 3  6. Migraine without aura and without status migrainosus, not intractable Stable. Diagnosis pulled for medication refill.  Continue current medical treatment plan. - baclofen (LIORESAL) 10 MG tablet; Take 1 tablet (10 mg total) by mouth 3 (three) times daily.  Dispense: 270 tablet; Refill:  3 - DULoxetine (CYMBALTA) 20 MG capsule; Take 2 capsules (40 mg total) by mouth daily.  Dispense: 180 capsule; Refill: 3 - QUEtiapine (SEROQUEL) 25 MG tablet; Take 3 tablets (75 mg total) by mouth at bedtime.  Dispense: 270 tablet; Refill: 3  7. Hemiplegic migraine without status migrainosus, not intractable Stable. Diagnosis pulled for medication refill. Continue current medical treatment plan. - baclofen (LIORESAL) 10 MG tablet; Take 1 tablet (10 mg total) by mouth 3 (three) times daily.  Dispense: 270 tablet; Refill: 3 - DULoxetine (CYMBALTA) 20 MG capsule; Take 2 capsules (40 mg total) by mouth daily.  Dispense: 180 capsule; Refill: 3 - QUEtiapine (SEROQUEL) 25 MG tablet; Take 3 tablets (75 mg total) by mouth at bedtime.  Dispense: 270 tablet; Refill: 3  8. Bipolar disorder, in partial remission, most recent episode depressed ([place]) Stable. Diagnosis pulled for medication refill. Continue current medical treatment plan. - baclofen (LIORESAL) 10 MG tablet; Take 1 tablet (10 mg total) by mouth 3 (three) times daily.  Dispense: 270 tablet; Refill: 3 - DULoxetine (CYMBALTA) 20 MG capsule; Take 2 capsules (40 mg total) by mouth daily.  Dispense: 180 capsule; Refill: 3 - QUEtiapine (SEROQUEL) 25 MG tablet; Take 3 tablets (75 mg total) by mouth at bedtime.  Dispense: 270 tablet; Refill: 3  9. Muscle spasm Stable. Diagnosis pulled for medication refill. Continue current medical treatment plan. - cyclobenzaprine (FLEXERIL) 5 MG tablet; Take 1 tablet (5 mg total) by mouth 3 (three) times daily as needed for muscle spasms.  Dispense: 30 tablet; Refill: 5  10. Acute midline low back pain without sciatica Stable. Diagnosis pulled for medication refill. Continue current medical treatment plan. - cyclobenzaprine (FLEXERIL) 5 MG tablet; Take  1 tablet (5 mg total) by mouth 3 (three) times daily as needed for muscle spasms.  Dispense: 30 tablet; Refill: 5 - gabapentin (NEURONTIN) 600 MG tablet; Take 1 tablet (600 mg total) by mouth at bedtime.  Dispense: 90 tablet; Refill: 1  11. Chronic midline low back pain without sciatica Stable. Diagnosis pulled for medication refill. Continue current medical treatment plan. - gabapentin (NEURONTIN) 600 MG tablet; Take 1 tablet (600 mg total) by mouth at bedtime.  Dispense: 90 tablet; Refill: 1 - HYDROcodone-acetaminophen (NORCO/VICODIN) 5-325 MG tablet; Take 1 tablet by mouth every 4 (four) hours as needed for moderate pain or severe pain.  Dispense: 90 tablet; Refill: 0  12. Chronic bilateral low back pain without sciatica Stable. Diagnosis pulled for medication refill. Continue current medical treatment plan. - meloxicam (MOBIC) 7.5 MG tablet; Take 1 tablet (7.5 mg total) by mouth in the morning and at bedtime.  Dispense: 60 tablet; Refill: 5   No follow-ups on file.      Reynolds Bowl, PA-C, have reviewed all documentation for this visit. The documentation on 04/22/20 for the exam, diagnosis, procedures, and orders are all accurate and complete.   Rubye Beach  [facility] [phone] (phone) [phone] (fax)  [place]

## 2020-05-05 ENCOUNTER — Encounter: Payer: Self-pay | Admitting: Physician Assistant

## 2020-05-05 DIAGNOSIS — G43409 Hemiplegic migraine, not intractable, without status migrainosus: Secondary | ICD-10-CM

## 2020-05-05 MED ORDER — AIMOVIG 140 MG/ML ~~LOC~~ SOAJ: 140.0000 mg | SUBCUTANEOUS | 11 refills | Status: DC

## 2020-05-08 NOTE — Telephone Encounter (Signed)
Yes I think so. I have not seen the denial letter yet, so normally it is with that

## 2020-05-12 ENCOUNTER — Encounter: Payer: Self-pay | Admitting: Family Medicine

## 2020-05-12 DIAGNOSIS — G43009 Migraine without aura, not intractable, without status migrainosus: Secondary | ICD-10-CM

## 2020-05-15 ENCOUNTER — Telehealth: Payer: Self-pay | Admitting: Physician Assistant

## 2020-05-15 NOTE — Telephone Encounter (Signed)
Please call pharmacy and see if they have reference number for a PA in CoverMyMeds, or if there a preferred alternative

## 2020-05-15 NOTE — Telephone Encounter (Signed)
Patient is calling again to get an update on her request for Erenumab-aooe (AIMOVIG) 140 MG/ML SOAJ.  She stated that the Dr. Marlyn Corporal was supposed to get this approved before she left and patient still has not received the medication.  Please advise and call patient asap to discuss at [phone]

## 2020-05-19 NOTE — Telephone Encounter (Signed)
Denial letter in chart.

## 2020-05-21 MED ORDER — AJOVY 225 MG/1.5ML ~~LOC~~ SOAJ: 225.0000 mg | SUBCUTANEOUS | 3 refills | Status: DC

## 2020-05-23 NOTE — Telephone Encounter (Signed)
Cover my Meds called in to say that there were paper work sent over via fax for [place] and ask that you refer to the determination letter for where the appeal need to be faxed in to.  Ph# [phone]

## 2020-05-23 NOTE — Telephone Encounter (Signed)
They require a trial of BOTH Emgality and Ajovy. I sent in a prescription for Ajovy a few days ago.

## 2020-05-23 NOTE — Telephone Encounter (Signed)
See follow up message below. KW

## 2020-05-26 ENCOUNTER — Ambulatory Visit: Payer: Self-pay | Admitting: *Deleted

## 2020-05-26 NOTE — Telephone Encounter (Signed)
Pt returned call and was given the message from Dr. Caryn Section regarding the [place].   She has found a new doctor so has cancelled the [place] prescription with her pharmacy.

## 2020-05-26 NOTE — Telephone Encounter (Signed)
Pt called in and was given the message from Dr. Caryn Section regarding the [place].  She was due a shot today.  She has changed doctors so has cancelled the rx for the [place].

## 2020-05-26 NOTE — Telephone Encounter (Signed)
Tried calling patient. Left message to call back. OK for [facility] triage to advise of message.  

## 2020-06-19 ENCOUNTER — Encounter: Payer: BC Managed Care – PPO | Admitting: Dermatology

## 2020-07-01 ENCOUNTER — Emergency Department (HOSPITAL_COMMUNITY): Payer: BC Managed Care – PPO

## 2020-07-01 ENCOUNTER — Encounter (HOSPITAL_COMMUNITY): Payer: Self-pay

## 2020-07-01 ENCOUNTER — Other Ambulatory Visit: Payer: Self-pay

## 2020-07-01 ENCOUNTER — Emergency Department (HOSPITAL_COMMUNITY)
Admission: EM | Admit: 2020-07-01 | Discharge: 2020-07-01 | Disposition: A | Discharge status: Home / Self Care | Payer: BC Managed Care – PPO | Attending: Emergency Medicine | Admitting: Emergency Medicine

## 2020-07-01 DIAGNOSIS — R000 Tachycardia, unspecified: Secondary | ICD-10-CM | POA: Insufficient documentation

## 2020-07-01 DIAGNOSIS — Y92410 Unspecified street and highway as the place of occurrence of the external cause: Secondary | ICD-10-CM | POA: Insufficient documentation

## 2020-07-01 DIAGNOSIS — S39012A Strain of muscle, fascia and tendon of lower back, initial encounter: Secondary | ICD-10-CM | POA: Diagnosis not present

## 2020-07-01 DIAGNOSIS — F17210 Nicotine dependence, cigarettes, uncomplicated: Secondary | ICD-10-CM | POA: Insufficient documentation

## 2020-07-01 DIAGNOSIS — J449 Chronic obstructive pulmonary disease, unspecified: Secondary | ICD-10-CM | POA: Insufficient documentation

## 2020-07-01 DIAGNOSIS — Z7951 Long term (current) use of inhaled steroids: Secondary | ICD-10-CM | POA: Diagnosis not present

## 2020-07-01 DIAGNOSIS — S3992XA Unspecified injury of lower back, initial encounter: Secondary | ICD-10-CM | POA: Diagnosis present

## 2020-07-01 MED ORDER — HYDROCODONE-ACETAMINOPHEN 5-325 MG PO TABS
1.0000 | ORAL_TABLET | Freq: Once | ORAL | Status: AC
  Administered 2020-07-01: 1 via ORAL
  Filled 2020-07-01: qty 1

## 2020-07-01 NOTE — ED Notes (Signed)
Patient transported to X-ray 
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
## 2020-07-01 NOTE — Discharge Instructions (Signed)
Use Tylenol every 4 hours and ice regularly for pain.  Follow-up with your doctor if no improvement or return for worsening signs or symptoms.
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
## 2020-07-14 ENCOUNTER — Other Ambulatory Visit: Payer: Self-pay | Admitting: Physician Assistant

## 2020-07-14 DIAGNOSIS — K2270 Barrett's esophagus without dysplasia: Secondary | ICD-10-CM

## 2020-07-28 ENCOUNTER — Encounter: Payer: Self-pay | Admitting: Adult Health

## 2020-08-07 ENCOUNTER — Ambulatory Visit: Payer: BC Managed Care – PPO | Admitting: Adult Health

## 2020-09-04 ENCOUNTER — Ambulatory Visit: Payer: BC Managed Care – PPO | Admitting: Dermatology

## 2020-09-04 ENCOUNTER — Other Ambulatory Visit: Payer: Self-pay

## 2020-09-04 DIAGNOSIS — Z1283 Encounter for screening for malignant neoplasm of skin: Secondary | ICD-10-CM | POA: Diagnosis not present

## 2020-09-04 DIAGNOSIS — D489 Neoplasm of uncertain behavior, unspecified: Secondary | ICD-10-CM | POA: Diagnosis not present

## 2020-09-04 DIAGNOSIS — L570 Actinic keratosis: Secondary | ICD-10-CM

## 2020-09-04 DIAGNOSIS — D1800 Hemangioma unspecified site: Secondary | ICD-10-CM

## 2020-09-04 DIAGNOSIS — L814 Other melanin hyperpigmentation: Secondary | ICD-10-CM

## 2020-09-04 DIAGNOSIS — L578 Other skin changes due to chronic exposure to nonionizing radiation: Secondary | ICD-10-CM

## 2020-09-04 DIAGNOSIS — D229 Melanocytic nevi, unspecified: Secondary | ICD-10-CM

## 2020-09-04 DIAGNOSIS — Z86018 Personal history of other benign neoplasm: Secondary | ICD-10-CM | POA: Diagnosis not present

## 2020-09-04 DIAGNOSIS — L821 Other seborrheic keratosis: Secondary | ICD-10-CM

## 2020-09-04 DIAGNOSIS — D2372 Other benign neoplasm of skin of left lower limb, including hip: Secondary | ICD-10-CM

## 2020-09-04 NOTE — Progress Notes (Signed)
Follow-Up Visit   Subjective  Jordan Ortega is a 65 y.o. female who presents for the following: Follow-up (Patient here today for 6 month tbse. She reports no new concerns. Patient reports family history of skin cancer in mother. ).  Patient here for full body skin exam and skin cancer screening.  The following portions of the chart were reviewed this encounter and updated as appropriate:  Tobacco  Allergies  Meds  Problems  Med Hx  Surg Hx  Fam Hx      Objective  Well appearing patient in no apparent distress; mood and affect are within normal limits.  A full examination was performed including scalp, head, eyes, ears, nose, lips, neck, chest, axillae, abdomen, back, buttocks, bilateral upper extremities, bilateral lower extremities, hands, feet, fingers, toes, fingernails, and toenails. All findings within normal limits unless otherwise noted below.  Mid Tip of Nose Scaling spot at tip of nose    left posterior thigh 4.1 cm x 3.2 cm waxy nodule   Assessment & Plan  AK (actinic keratosis) Mid Tip of Nose  Possible AK vs excoriation  Will recheck at follow up  Discussed treating with LN2 or cream treatment at next follow up  Recommend daily broad spectrum sunscreen SPF 30+ to sun-exposed areas, reapply every 2 hours as needed. Call for new or changing lesions.  Staying in the shade or wearing long sleeves, sun glasses (UVA+UVB protection) and wide brim hats (4-inch brim around the entire circumference of the hat) are also recommended for sun protection.      Neoplasm of uncertain behavior left posterior thigh  favor lipoma  Benign-appearing.  Observation.  Call clinic for new or changing lesions.    Lentigines - Scattered tan macules - Due to sun exposure - Benign-appering, observe - Recommend daily broad spectrum sunscreen SPF 30+ to sun-exposed areas, reapply every 2 hours as needed. - Call for any changes  Seborrheic Keratoses - Stuck-on, waxy,  tan-brown papules and/or plaques  - Benign-appearing - Discussed benign etiology and prognosis. - Observe - Call for any changes  Melanocytic Nevi - Tan-brown and/or pink-flesh-colored symmetric macules and papules - Benign appearing on exam today - Observation - Call clinic for new or changing moles - Recommend daily use of broad spectrum spf 30+ sunscreen to sun-exposed areas.   Hemangiomas - Red papules - Discussed benign nature - Observe - Call for any changes  Dermatofibroma - Firm pink/brown papulenodule with dimple sign left lower leg - Benign appearing - Call for any changes  Actinic Damage - Chronic condition, secondary to cumulative UV/sun exposure - diffuse scaly erythematous macules with underlying dyspigmentation - Recommend daily broad spectrum sunscreen SPF 30+ to sun-exposed areas, reapply every 2 hours as needed.  - Staying in the shade or wearing long sleeves, sun glasses (UVA+UVB protection) and wide brim hats (4-inch brim around the entire circumference of the hat) are also recommended for sun protection.  - Call for new or changing lesions.  History of Dysplastic Nevi - No evidence of recurrence today right lower abdomen 2020 and 2021 - Recommend regular full body skin exams - Recommend daily broad spectrum sunscreen SPF 30+ to sun-exposed areas, reapply every 2 hours as needed.  - Call if any new or changing lesions are noted between office visits  Skin cancer screening performed today.  Return in about 1 year (around 09/04/2021) for tbse . I, Ruthell Rummage, CMA, am acting as scribe for Forest Gleason, MD.  Documentation: I have reviewed the  above documentation for accuracy and completeness, and I agree with the above.  Forest Gleason, MD

## 2020-09-04 NOTE — Patient Instructions (Signed)
Melanoma ABCDEs  Melanoma is the most dangerous type of skin cancer, and is the leading cause of death from skin disease.  You are more likely to develop melanoma if you: Have light-colored skin, light-colored eyes, or red or blond hair Spend a lot of time in the sun Tan regularly, either outdoors or in a tanning bed Have had blistering sunburns, especially during childhood Have a close family member who has had a melanoma Have atypical moles or large birthmarks  Early detection of melanoma is key since treatment is typically straightforward and cure rates are extremely high if we catch it early.   The first sign of melanoma is often a change in a mole or a new dark spot.  The ABCDE system is a way of remembering the signs of melanoma.  A for asymmetry:  The two halves do not match. B for border:  The edges of the growth are irregular. C for color:  A mixture of colors are present instead of an even brown color. D for diameter:  Melanomas are usually (but not always) greater than 81mm - the size of a pencil eraser. E for evolution:  The spot keeps changing in size, shape, and color.  Please check your skin once per month between visits. You can use a small mirror in front and a large mirror behind you to keep an eye on the back side or your body.   If you see any new or changing lesions before your next follow-up, please call to schedule a visit.  Please continue daily skin protection including broad spectrum sunscreen SPF 30+ to sun-exposed areas, reapplying every 2 hours as needed when you're outdoors.    Recommend taking Heliocare sun protection supplement daily in sunny weather for additional sun protection. For maximum protection on the sunniest days, you can take up to 2 capsules of regular Heliocare OR take 1 capsule of Heliocare Ultra. For prolonged exposure (such as a full day in the sun), you can repeat your dose of the supplement 4 hours after your first dose. Heliocare can be  purchased at [facility] or at [email or web address].    If you have any questions or concerns for your doctor, please call our main line at [phone] and press option 4 to reach your doctor's medical assistant. If no one answers, please leave a voicemail as directed and we will return your call as soon as possible. Messages left after 4 pm will be answered the following business day.   You may also send [place] a message via [place]. We typically respond to MyChart messages within 1-2 business days.  For prescription refills, please ask your pharmacy to contact our office. Our fax number is [phone].  If you have an urgent issue when the clinic is closed that cannot wait until the next business day, you can page your doctor at the number below.    Please note that while we do our best to be available for urgent issues outside of office hours, we are not available 24/7.   If you have an urgent issue and are unable to reach [place], you may choose to seek medical care at your doctor's office, retail clinic, urgent care center, or emergency room.  If you have a medical emergency, please immediately call 911 or go to the emergency department.  Pager Numbers  - Dr. Nehemiah Massed: [phone]  - Dr. Laurence Ferrari: [phone]  - Dr. Nicole Kindred: [phone]  In the event of inclement weather, please call our main line  at [phone] for an update on the status of any delays or closures.  Dermatology Medication Tips: Please keep the boxes that topical medications come in in order to help keep track of the instructions about where and how to use these. Pharmacies typically print the medication instructions only on the boxes and not directly on the medication tubes.   If your medication is too expensive, please contact our office at [phone] option 4 or send [place] a message through [place].   We are unable to tell what your co-pay for medications will be in advance as this is different depending on your  insurance coverage. However, we may be able to find a substitute medication at lower cost or fill out paperwork to get insurance to cover a needed medication.   If a prior authorization is required to get your medication covered by your insurance company, please allow [place] 1-2 business days to complete this process.  Drug prices often vary depending on where the prescription is filled and some pharmacies may offer cheaper prices.  The website www.goodrx.com contains coupons for medications through different pharmacies. The prices here do not account for what the cost may be with help from insurance (it may be cheaper with your insurance), but the website can give you the price if you did not use any insurance.  - You can print the associated coupon and take it with your prescription to the pharmacy.  - You may also stop by our office during regular business hours and pick up a GoodRx coupon card.  - If you need your prescription sent electronically to a different pharmacy, notify our office through [facility] or by phone at [phone] option 4.

## 2020-09-16 ENCOUNTER — Encounter: Payer: Self-pay | Admitting: Dermatology

## 2020-09-17 IMAGING — MR MR THORACIC SPINE W/O CM
6 series · 36 of 48 positions shown · non-contrast
Comparison: Chest radiographs of 11/07/2017

CLINICAL DATA: Mid back pain with bilateral hand and foot pain

EXAM:
MRI THORACIC SPINE WITHOUT CONTRAST
TECHNIQUE: Multiplanar, multisequence MR imaging of the thoracic spine was
performed. No intravenous contrast was administered.

[Series 25: T1 · sagittal · 5.0mm · 1.88mm/px · 4 of 10 slices shown (1 of 2)]
[im 1/10]
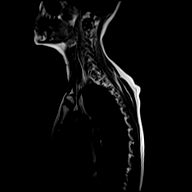
[im 4/10]
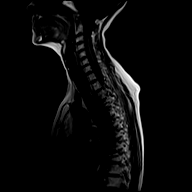
[im 7/10]
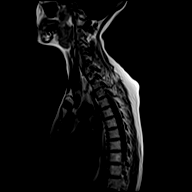
[im 10/10]
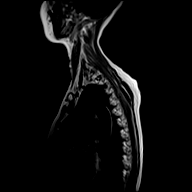

[Series 26: T2 · sagittal · 3.0mm · 1.33mm/px · 6 of 18 slices shown (1 of 2)]
[im 1/18]
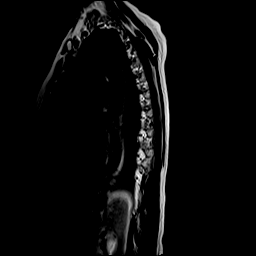
[im 4/18]
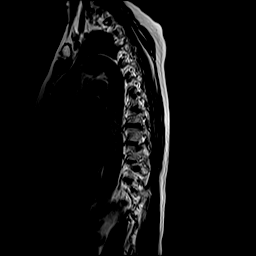
[im 7/18]
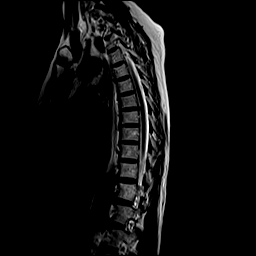
[im 11/18]
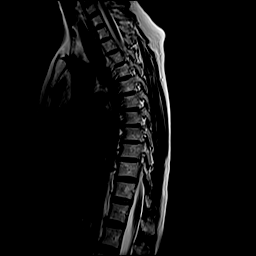
[im 14/18]
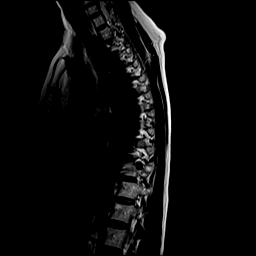
[im 18/18]
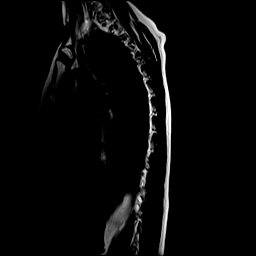

[Series 27: T1 · sagittal · 3.0mm · 1.33mm/px · 6 of 18 slices shown (2 of 2)]
[im 1/18]
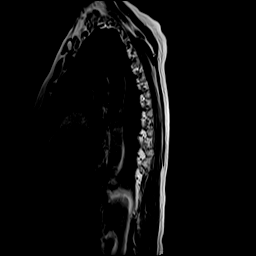
[im 4/18]
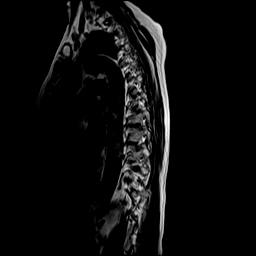
[im 7/18]
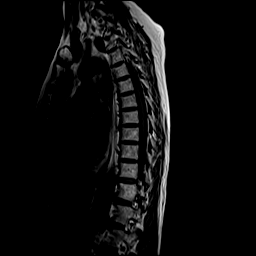
[im 11/18]
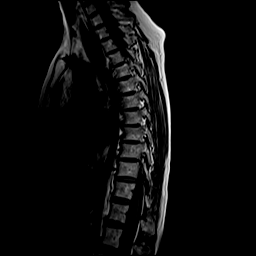
[im 14/18]
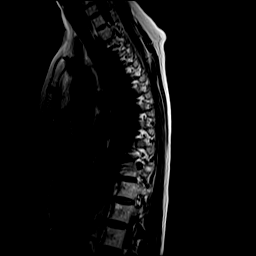
[im 18/18]
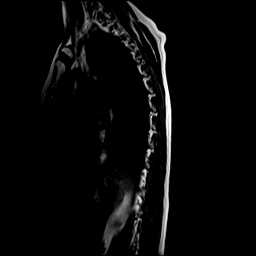

[Series 28: STIR · sagittal · 3.0mm · 0.66mm/px · 6 of 18 slices shown]
[im 1/18]
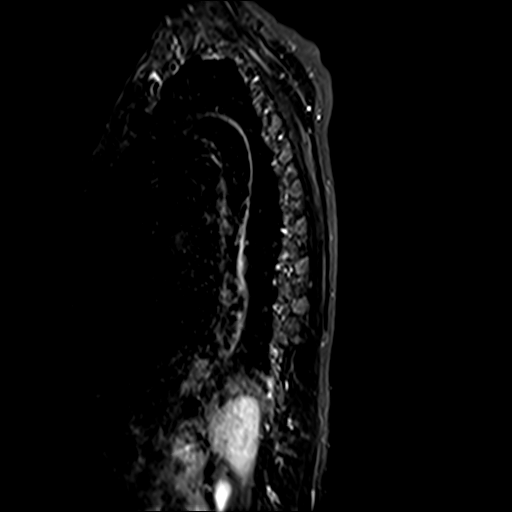
[im 4/18]
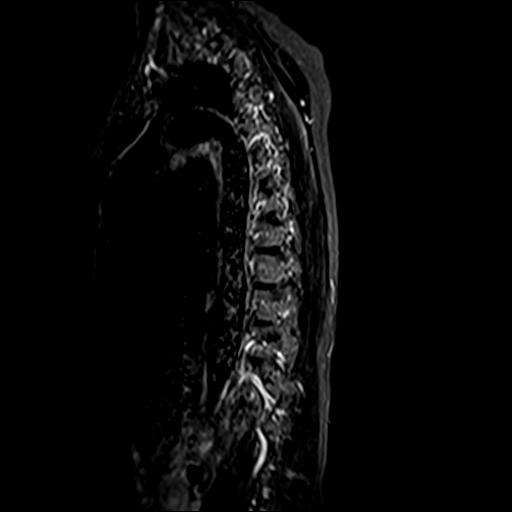
[im 7/18]
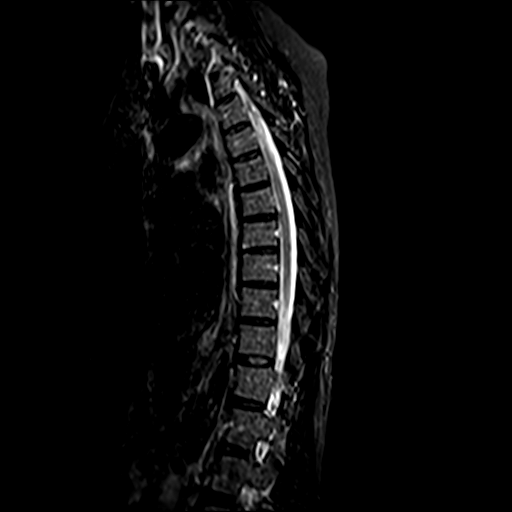
[im 11/18]
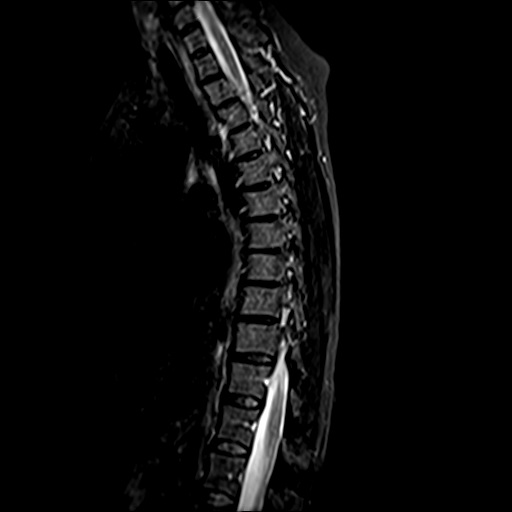
[im 14/18]
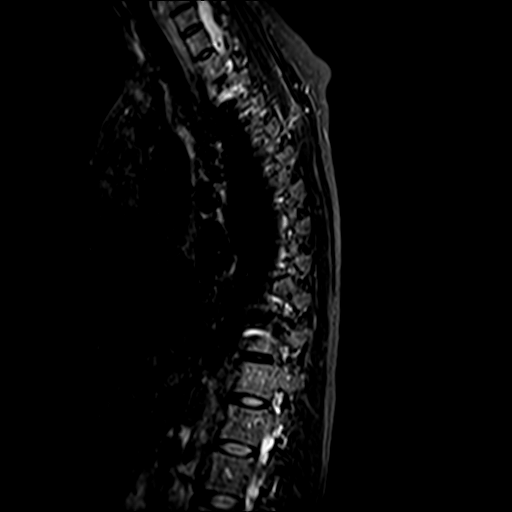
[im 18/18]
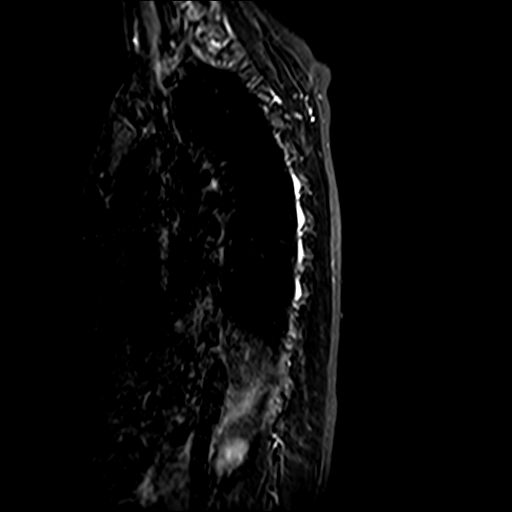

[Series 29: T2 · axial · 4.0mm · 0.59mm/px · z∈[-244,-61]mm · 9 of 39 slices shown (2 of 2)]
[im 1/39]
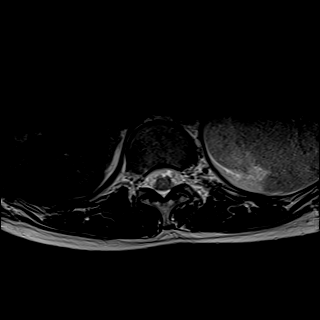
[im 7/39]
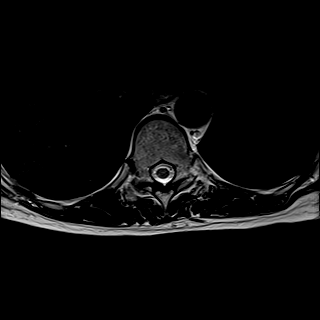
[im 13/39]
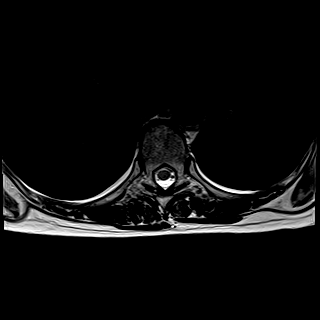
[im 16/39]
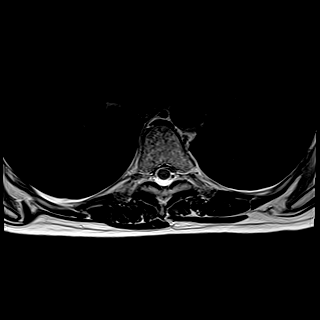
[im 20/39]
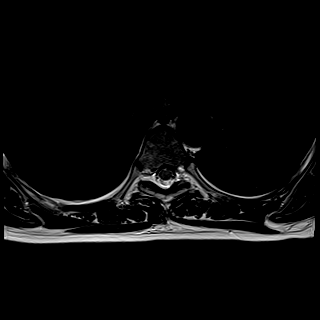
[im 23/39]
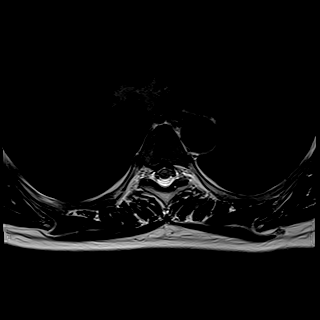
[im 26/39]
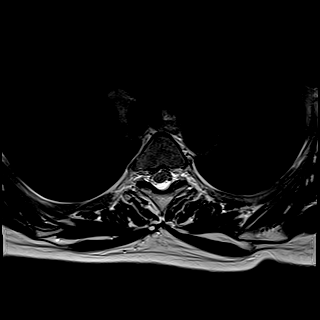
[im 32/39]
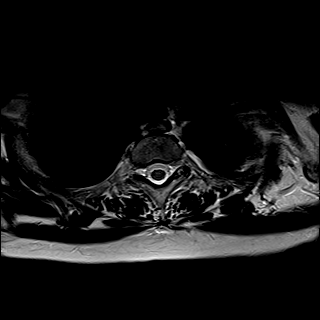
[im 39/39]
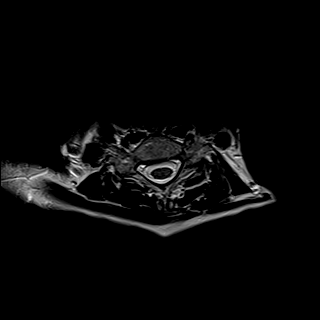

[Series 30: GRE · axial · 4.0mm · 0.37mm/px · z∈[-244,-135]mm · 5 of 39 slices shown]
[im 1/39]
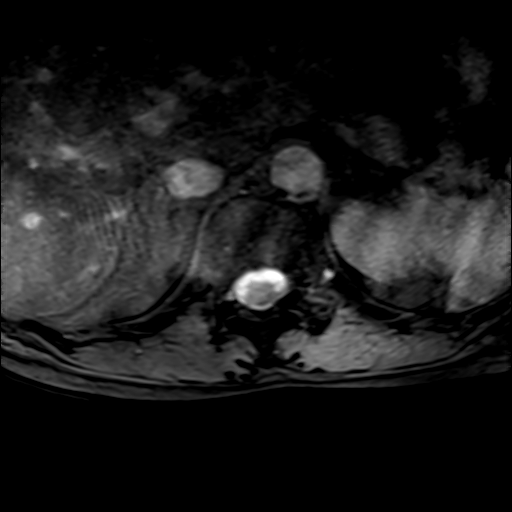
[im 7/39]
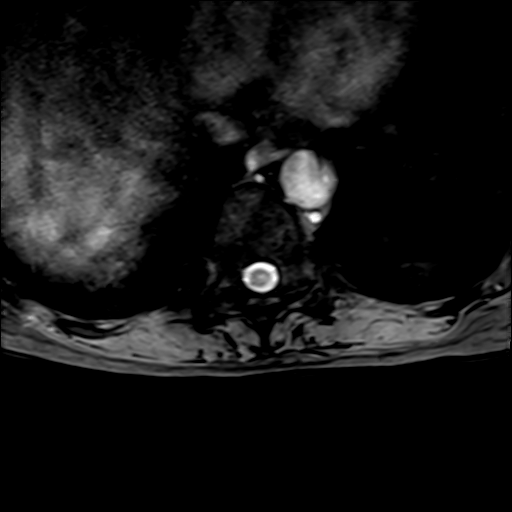
[im 13/39]
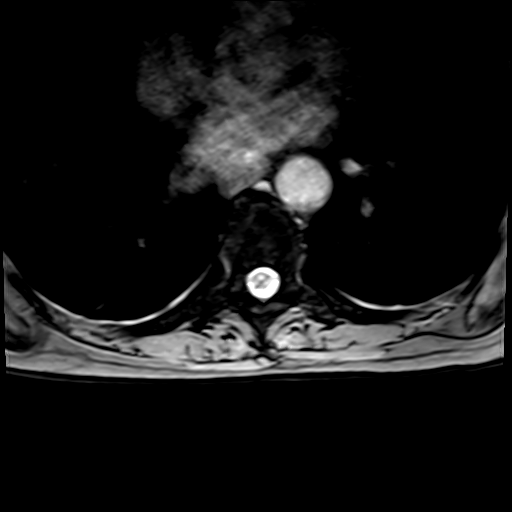
[im 16/39]
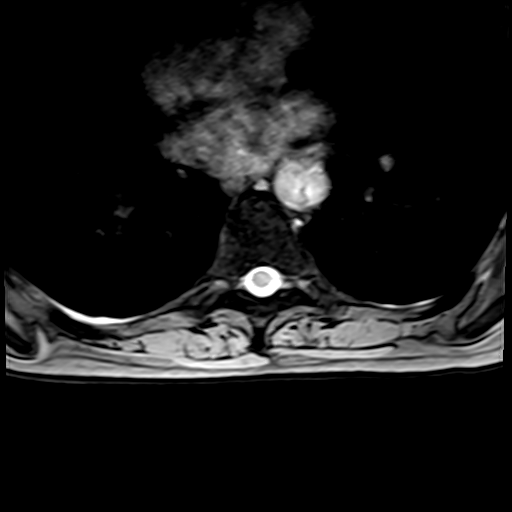
[im 23/39]
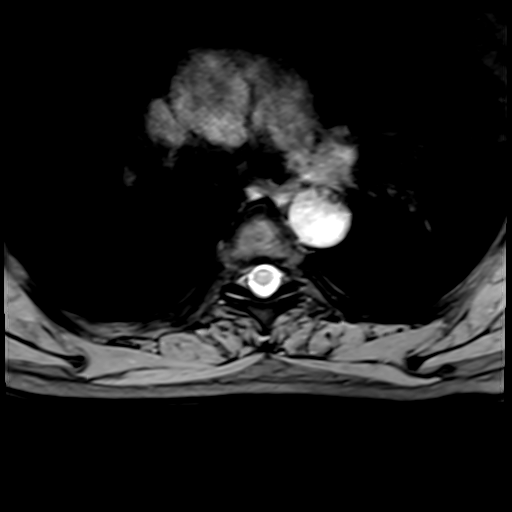

[36 of 48 positions shown; findings below may reference images not displayed]

FINDINGS: Alignment:  Mild levoconvex thoracic scoliosis.  No subluxations

Vertebrae: Minimal type 1 degenerative findings anteriorly at the
T8-9 level.

Cord:  Unremarkable

Paraspinal and other soft tissues: Unremarkable

Disc levels:

No significant disc level findings above T4-5 in the thoracic spine.

T4-5: No impingement. Right paracentral on lateral recess disc
protrusion, image 14/29.

T5-6: Unremarkable.

T6-7: Unremarkable.

T7-8: No impingement. Left paracentral and lateral recess disc
protrusion.

T8-9: Unremarkable.

T9-10: Unremarkable.

T10-11: Unremarkable.

T11-12: Impingement.  Bilateral facet arthropathy.

T12-L1: No impingement.  Left facet arthropathy.
IMPRESSION: 1. No appreciable impingement in the thoracic spine.
2. Degenerative disc disease at T4-5 and T7-8. Mild facet
arthropathy at T11-12 and T12-L1.
3. Mild levoconvex thoracic scoliosis.

## 2020-09-19 ENCOUNTER — Other Ambulatory Visit (INDEPENDENT_AMBULATORY_CARE_PROVIDER_SITE_OTHER): Payer: Self-pay | Admitting: Nurse Practitioner

## 2020-09-19 DIAGNOSIS — I83813 Varicose veins of bilateral lower extremities with pain: Secondary | ICD-10-CM

## 2020-09-22 ENCOUNTER — Ambulatory Visit (INDEPENDENT_AMBULATORY_CARE_PROVIDER_SITE_OTHER): Payer: BC Managed Care – PPO

## 2020-09-22 ENCOUNTER — Ambulatory Visit (INDEPENDENT_AMBULATORY_CARE_PROVIDER_SITE_OTHER): Payer: BC Managed Care – PPO | Admitting: Nurse Practitioner

## 2020-09-22 ENCOUNTER — Other Ambulatory Visit: Payer: Self-pay

## 2020-09-22 VITALS — BP 147/81 | HR 74 | Ht 60.0 in | Wt 101.0 lb

## 2020-09-22 DIAGNOSIS — I83813 Varicose veins of bilateral lower extremities with pain: Secondary | ICD-10-CM

## 2020-09-22 DIAGNOSIS — E782 Mixed hyperlipidemia: Secondary | ICD-10-CM

## 2020-09-29 ENCOUNTER — Encounter (INDEPENDENT_AMBULATORY_CARE_PROVIDER_SITE_OTHER): Payer: Self-pay | Admitting: Nurse Practitioner

## 2020-09-29 NOTE — Progress Notes (Signed)
Subjective:    Patient ID: Jordan Ortega, female    DOB: 08/23/1955, 65 y.o.   MRN: [id number] Chief Complaint  Patient presents with  . New Patient (Initial Visit)    NP Kalisetti. VV with pain BLE ven reflux    Jordan Ortega is a 65 year old female that is referred by Dr. Tressia Miners for painful varicosities.  The patient notes that she stands for very long hours.  She is typically standing from 5:30 AM until late in the evening.  She notes that the pain in her lower extremities has been ongoing for several months.  The patient also does have a noted compression fracture and scoliosis in her lower spine.  The patient denies use of medical grade compression stockings.  She describes an aching throbbing sensation.  Today noninvasive study showed no evidence of DVT or superficial thrombophlebitis bilaterally.  No evidence of superficial venous reflux in the great or short saphenous veins.  No evidence of deep venous insufficiency.  Review of Systems  Cardiovascular:  Positive for leg swelling.  All other systems reviewed and are negative.     Objective:   Physical Exam Vitals reviewed.  HENT:     Head: Normocephalic.  Cardiovascular:     Rate and Rhythm: Normal rate.     Pulses: Normal pulses.  Pulmonary:     Effort: Pulmonary effort is normal.  Musculoskeletal:     Right lower leg: Edema present.     Left lower leg: Edema present.  Neurological:     Mental Status: She is alert and oriented to person, place, and time.  Psychiatric:        Mood and Affect: Mood normal.        Behavior: Behavior normal.        Thought Content: Thought content normal.        Judgment: Judgment normal.    BP (!) 147/81   Pulse 74   Ht 5' (1.524 m)   Wt 101 lb (45.8 kg)   BMI 19.73 kg/m   Past Medical History:  Diagnosis Date  . Actinic keratosis   . Adnexal mass   . Anxiety   . Barrett's esophagus   . Bell palsy    left foot also ,   . Bronchitis   . Complication of anesthesia     difficulty waking up  . COPD (chronic obstructive pulmonary disease) ([place])   . Degenerative disc disease, cervical   . Depression   . Dysplastic nevus 06/08/2019   Right lower abdomen, moderate atypia, limited margins free.  Marland Kitchen Dysplastic nevus 12/06/2018   right lower abdomen/moderate, limited margins free.  . Environmental and seasonal allergies   . GERD (gastroesophageal reflux disease)   . Heart murmur    Hx  . High triglycerides   . Migraines   . Migraines   . Rapid heart rate     Social History   Socioeconomic History  . Marital status: Married    Spouse name: Not on file  . Number of children: Not on file  . Years of education: Not on file  . Highest education level: Not on file  Occupational History  . Not on file  Tobacco Use  . Smoking status: Every Day    Packs/day: 2.00    Years: 46.00    Pack years: 92.00    Types: Cigarettes  . Smokeless tobacco: Never  . Tobacco comments:    .5ppd currently  Vaping Use  . Vaping Use: Never used  Substance and Sexual Activity  . Alcohol use: Not Currently    Alcohol/week: 0.0 standard drinks  . Drug use: No  . Sexual activity: Not on file  Other Topics Concern  . Not on file  Social History Narrative  . Not on file   Social Determinants of Health   Financial Resource Strain: Not on file  Food Insecurity: Not on file  Transportation Needs: Not on file  Physical Activity: Not on file  Stress: Not on file  Social Connections: Not on file  Intimate Partner Violence: Not on file    Past Surgical History:  Procedure Laterality Date  . ABDOMINAL HYSTERECTOMY    . APPENDECTOMY    . CARDIAC CATHETERIZATION     [facility] pt doesn't have any stents or recall years ago 20+ yrs ago  . CHOLECYSTECTOMY    . COLONOSCOPY WITH PROPOFOL N/A 07/16/2016   Procedure: COLONOSCOPY WITH PROPOFOL;  Surgeon: [name], MD;  Location[facility] ENDOSCOPY;  Service: Endoscopy;  Laterality: N/A;  . COLONOSCOPY WITH PROPOFOL N/A 10/20/2018    Procedure: COLONOSCOPY WITH PROPOFOL;  Surgeon: [name], MD;  Location: [facility] ENDOSCOPY;  Service: Gastroenterology;  Laterality: N/A;  . DIAGNOSTIC LAPAROSCOPY    . ESOPHAGOGASTRODUODENOSCOPY (EGD) WITH PROPOFOL N/A 07/16/2016   Procedure: ESOPHAGOGASTRODUODENOSCOPY (EGD) WITH PROPOFOL;  Surgeon: [name], MD;  Location: [facility] ENDOSCOPY;  Service: Endoscopy;  Laterality: N/A;  . ESOPHAGOGASTRODUODENOSCOPY (EGD) WITH PROPOFOL N/A 12/05/2018   Procedure: ESOPHAGOGASTRODUODENOSCOPY (EGD) WITH PROPOFOL;  Surgeon: [name], MD;  Location: [facility] ENDOSCOPY;  Service: Gastroenterology;  Laterality: N/A;  . [place]  . GIVENS CAPSULE STUDY N/A 12/21/2018   Procedure: GIVENS CAPSULE STUDY;  Surgeon: [name], MD;  Location: [facility] ENDOSCOPY;  Service: Gastroenterology;  Laterality: N/A;  . LAPAROSCOPIC SALPINGO OOPHERECTOMY Bilateral 08/27/2016   Procedure: LAPAROSCOPIC SALPINGO OOPHORECTOMY;  Surgeon: Gae Dry, MD;  Location: [facility] ORS;  Service: Gynecology;  Laterality: Bilateral;  . TUBAL LIGATION  1983    Family History  Problem Relation Age of Onset  . Breast cancer Mother 25  . Skin cancer Mother   . Hypothyroidism Mother   . Parkinson's disease Mother   . Migraines Sister   . Colon cancer Father   . Lung cancer Father   . Heart disease Father   . Diabetes Paternal Grandmother   . Hypertension Paternal Grandmother   . Stroke Paternal Grandmother   . Dementia Paternal Grandfather   . Breast cancer Maternal Aunt        post men.  . Diabetes Paternal Uncle   . Breast cancer Cousin 65       maternal   . Multiple myeloma Cousin     Allergies  Allergen Reactions  . Diazepam Other (See Comments)    Other reaction(s): Hallucination Patient states "it makes me crazy" REACTION: Behavioral problems  . Ciprofloxacin Other (See Comments)    Other reaction(s): Unknown Loopy  . Codeine Hives and Itching    REACTION: Dizziness  . Metoprolol      Migraine/headaches   . Morphine Hives    Other reaction(s): Unknown  . Niacin And Related Other (See Comments)    flushing  . Topiramate Other (See Comments)  . Toradol [Ketorolac Tromethamine] Itching    Tolerates when taken with Benadryl  . Doxycycline Diarrhea    CBC Latest Ref Rng & Units 01/02/2019 10/03/2018 08/23/2018  WBC 4.0 - 10.5 K/uL 5.7 5.7 6.8  Hemoglobin 12.0 - 15.0 g/dL 13.3 13.9 13.3  Hematocrit 36.0 -  46.0 % 39.0 40.9 39.3  Platelets 150 - 400 K/uL 224 246 223      CMP     Component Value Date/Time   NA 137 01/02/2019 1129   NA 142 08/14/2018 1604   NA 141 08/19/2011 1848   K 4.4 01/02/2019 1129   K 3.5 08/19/2011 1848   CL 104 01/02/2019 1129   CL 111 (H) 08/19/2011 1848   CO2 25 01/02/2019 1129   CO2 23 08/19/2011 1848   GLUCOSE 102 (H) 01/02/2019 1129   GLUCOSE 108 (H) 08/19/2011 1848   BUN 17 01/02/2019 1129   BUN 15 08/14/2018 1604   BUN 13 08/19/2011 1848   CREATININE 0.78 01/02/2019 1129   CREATININE 1.24 08/19/2011 1848   CALCIUM 9.5 01/02/2019 1129   CALCIUM 8.4 (L) 08/19/2011 1848   PROT 7.4 01/02/2019 1129   PROT 6.4 08/14/2018 1604   PROT 6.9 08/19/2011 1848   ALBUMIN 4.6 01/02/2019 1129   ALBUMIN 4.5 08/14/2018 1604   ALBUMIN 3.4 08/19/2011 1848   AST 18 01/02/2019 1129   AST 24 08/19/2011 1848   ALT 17 01/02/2019 1129   ALT 34 08/19/2011 1848   ALKPHOS 51 01/02/2019 1129   ALKPHOS 116 08/19/2011 1848   BILITOT 0.6 01/02/2019 1129   BILITOT <0.2 08/14/2018 1604   BILITOT 0.2 08/19/2011 1848   GFRNONAA >60 01/02/2019 1129   GFRNONAA 49 (L) 08/19/2011 1848   GFRAA >60 01/02/2019 1129   GFRAA 57 (L) 08/19/2011 1848     No results found.     Assessment & Plan:   1. Varicose veins of bilateral lower extremities with pain  Recommend:  The patient has large symptomatic varicose veins that are painful and associated with swelling.  I have had a long discussion with the patient regarding  varicose veins and swelling and why  they cause symptoms.  Patient will begin wearing graduated compression stockings class 1 on a daily basis, beginning first thing in the morning and removing them in the evening. The patient is instructed specifically not to sleep in the stockings.    The patient  will also begin using over-the-counter analgesics such as Motrin 600 mg po TID to help control the symptoms.    In addition, behavioral modification including elevation during the day will be initiated.    Pending the results of these changes the  patient will be reevaluated in three months.    Further plans will be based on the ultrasound results and whether conservative therapies are successful at eliminating the pain and swelling.    2. Mixed hyperlipidemia Continue statin as ordered and reviewed, no changes at this time    Current Outpatient Medications on File Prior to Visit  Medication Sig Dispense Refill  . Abaloparatide (TYMLOS) 3120 MCG/1.56ML SOPN Inject into the skin.    Marland Kitchen albuterol (PROVENTIL HFA;VENTOLIN HFA) 108 (90 Base) MCG/ACT inhaler Inhale 2 puffs into the lungs every 6 (six) hours as needed for wheezing or shortness of breath. 1 Inhaler 2  . ALPRAZolam (XANAX) 0.5 MG tablet Take 1 tablet (0.5 mg total) by mouth 3 (three) times daily as needed for anxiety. 90 tablet 0  . Ascorbic Acid (VITAMIN C) 1000 MG tablet Take 1,000 mg by mouth daily.    Marland Kitchen ascorbic acid (VITAMIN C) 500 MG tablet Take by mouth.    . baclofen (LIORESAL) 10 MG tablet Take 1 tablet (10 mg total) by mouth 3 (three) times daily. 270 tablet 3  . budesonide-formoterol (SYMBICORT) 160-4.5 MCG/ACT  inhaler Inhale 1 puff into the lungs 2 (two) times daily. 1 Inhaler 0  . Cholecalciferol 25 MCG (1000 UT) tablet Take by mouth.    . cyclobenzaprine (FLEXERIL) 5 MG tablet Take 1 tablet (5 mg total) by mouth 3 (three) times daily as needed for muscle spasms. 30 tablet 5  . DULoxetine (CYMBALTA) 20 MG capsule Take 2 capsules (40 mg total) by mouth daily. 180  capsule 3  . DULoxetine (CYMBALTA) 60 MG capsule Take by mouth.    . Fremanezumab-vfrm (AJOVY) 225 MG/1.5ML SOAJ Inject 225 mg into the skin every 30 (thirty) days. 1.5 mL 3  . gabapentin (NEURONTIN) 600 MG tablet Take 1 tablet (600 mg total) by mouth at bedtime. 90 tablet 1  . HYDROcodone-acetaminophen (NORCO/VICODIN) 5-325 MG tablet Take 1 tablet by mouth every 4 (four) hours as needed for moderate pain or severe pain. 90 tablet 0  . loratadine (CLARITIN) 10 MG tablet Take 10 mg by mouth 2 (two) times daily.    Marland Kitchen loratadine (CLARITIN) 10 MG tablet Take by mouth.    . lubiprostone (AMITIZA) 8 MCG capsule Take by mouth.    . magnesium gluconate (MAGONATE) 500 MG tablet Take by mouth.    . meloxicam (MOBIC) 15 MG tablet Take by mouth.    . meloxicam (MOBIC) 7.5 MG tablet Take 1 tablet (7.5 mg total) by mouth in the morning and at bedtime. 60 tablet 5  . mirtazapine (REMERON) 45 MG tablet Take 1 tablet (45 mg total) by mouth at bedtime. 90 tablet 3  . mirtazapine (REMERON) 45 MG tablet Take by mouth.    . Multiple Vitamin (MULTIVITAMIN) tablet Take 1 tablet by mouth daily.    . pantoprazole (PROTONIX) 40 MG tablet TAKE 1 TABLET BY MOUTH ONCE A DAY 90 tablet 1  . pantoprazole (PROTONIX) 40 MG tablet Take 1 tablet by mouth 2 (two) times daily.    . Probiotic Product (PROBIOTIC-10) CHEW Chew 2 tablets by mouth daily.    . promethazine (PHENERGAN) 25 MG tablet Take 1 tablet (25 mg total) by mouth every 8 (eight) hours as needed for nausea or vomiting. 30 tablet 0  . QUEtiapine (SEROQUEL) 25 MG tablet Take 3 tablets (75 mg total) by mouth at bedtime. 270 tablet 3  . Rimegepant Sulfate (NURTEC) 75 MG TBDP Take 75 mg by mouth every other day as needed (migraine). 15 tablet 2  . simvastatin (ZOCOR) 20 MG tablet TAKE ONE TABLET BY MOUTH AT BEDTIME 90 tablet 3  . Specialty Vitamins Products (BIOTIN PLUS KERATIN) 10000-100 MCG-MG TABS Take 1 tablet by mouth daily.    . Teriparatide, Recombinant, 600  MCG/2.4ML SOPN Inject into the skin.    Marland Kitchen traZODone (DESYREL) 100 MG tablet Take 0.5 tablets (50 mg total) by mouth at bedtime. 90 tablet 3   No current facility-administered medications on file prior to visit.    There are no Patient Instructions on file for this visit. No follow-ups on file.   Kris Hartmann, NP

## 2020-11-21 ENCOUNTER — Other Ambulatory Visit: Payer: Self-pay | Admitting: Internal Medicine

## 2020-11-21 DIAGNOSIS — Z1231 Encounter for screening mammogram for malignant neoplasm of breast: Secondary | ICD-10-CM

## 2020-11-24 ENCOUNTER — Ambulatory Visit
Admission: RE | Admit: 2020-11-24 | Discharge: 2020-11-24 | Disposition: A | Discharge status: Home / Self Care | Payer: BC Managed Care – PPO | Source: Ambulatory Visit | Attending: Internal Medicine | Admitting: Internal Medicine

## 2020-11-24 ENCOUNTER — Other Ambulatory Visit: Payer: Self-pay

## 2020-11-24 DIAGNOSIS — Z1231 Encounter for screening mammogram for malignant neoplasm of breast: Secondary | ICD-10-CM | POA: Diagnosis not present

## 2020-12-16 ENCOUNTER — Other Ambulatory Visit: Payer: Self-pay | Admitting: Physician Assistant

## 2020-12-16 NOTE — Telephone Encounter (Signed)
Medication Refill - Medication:   traZODone (DESYREL) 100 MG tablet  Has the patient contacted their pharmacy? Yes.  Call was directly from [facility] pharmacist requesting this Rx be sent over high priority.  Preferred Pharmacy (with phone number or street name):   [facility] [place] [facility] [place]  [address] [place] [address]  Phone: [phone] Fax: [phone]   Has the patient been seen for an appointment in the last year OR does the patient have an upcoming appointment? Yes.  Last prescribed by Fenton Malling while at [facility].   Agent: Please be advised that RX refills may take up to 3 business days. We ask that you follow-up with your pharmacy.

## 2020-12-16 NOTE — Telephone Encounter (Signed)
Requested Prescriptions  Refused Prescriptions Disp Refills  . traZODone (DESYREL) 100 MG tablet 90 tablet 3    Sig: Take 0.5 tablets (50 mg total) by mouth at bedtime.     Psychiatry: Antidepressants - Serotonin Modulator Failed - 12/16/2020  3:35 PM      Failed - Completed PHQ-2 or PHQ-9 in the last 360 days      Failed - Valid encounter within last 6 months    Recent Outpatient Visits          7 months ago Hypercholesterolemia without hypertriglyceridemia   [facility] [place], [place] Sor[place], [place]   9 months ago [place], [place] M, [place]   11 months ago Intractable hemiplegic migraine without status migrainosus   [place], [place]   12 months ago Localized osteoporosis with current pathological fracture with delayed healing, subsequent encounter   [place], [place]   1 year ago Hemiplegic migraine without status migrainosus, not intractable   [facility], [place], [place]

## 2020-12-29 ENCOUNTER — Ambulatory Visit (INDEPENDENT_AMBULATORY_CARE_PROVIDER_SITE_OTHER): Payer: BC Managed Care – PPO | Admitting: Vascular Surgery

## 2021-01-23 ENCOUNTER — Other Ambulatory Visit: Payer: Self-pay | Admitting: Physician Assistant

## 2021-01-23 DIAGNOSIS — E7800 Pure hypercholesterolemia, unspecified: Secondary | ICD-10-CM

## 2021-02-19 DIAGNOSIS — G629 Polyneuropathy, unspecified: Secondary | ICD-10-CM | POA: Diagnosis not present

## 2021-02-19 DIAGNOSIS — R202 Paresthesia of skin: Secondary | ICD-10-CM | POA: Diagnosis not present

## 2021-02-19 DIAGNOSIS — Z8739 Personal history of other diseases of the musculoskeletal system and connective tissue: Secondary | ICD-10-CM | POA: Diagnosis not present

## 2021-02-23 ENCOUNTER — Encounter: Payer: Self-pay | Admitting: Neurology

## 2021-03-17 ENCOUNTER — Other Ambulatory Visit: Payer: Self-pay | Admitting: Physician Assistant

## 2021-03-17 DIAGNOSIS — G8929 Other chronic pain: Secondary | ICD-10-CM

## 2021-03-17 DIAGNOSIS — M545 Low back pain, unspecified: Secondary | ICD-10-CM

## 2021-03-21 ENCOUNTER — Other Ambulatory Visit: Payer: Self-pay | Admitting: Family Medicine

## 2021-03-21 DIAGNOSIS — G43009 Migraine without aura, not intractable, without status migrainosus: Secondary | ICD-10-CM

## 2021-03-30 DIAGNOSIS — Z0000 Encounter for general adult medical examination without abnormal findings: Secondary | ICD-10-CM | POA: Diagnosis not present

## 2021-03-30 DIAGNOSIS — Z780 Asymptomatic menopausal state: Secondary | ICD-10-CM | POA: Diagnosis not present

## 2021-04-03 DIAGNOSIS — M8000XD Age-related osteoporosis with current pathological fracture, unspecified site, subsequent encounter for fracture with routine healing: Secondary | ICD-10-CM | POA: Insufficient documentation

## 2021-04-03 DIAGNOSIS — Z0000 Encounter for general adult medical examination without abnormal findings: Secondary | ICD-10-CM | POA: Diagnosis not present

## 2021-04-03 DIAGNOSIS — G629 Polyneuropathy, unspecified: Secondary | ICD-10-CM | POA: Diagnosis not present

## 2021-04-03 DIAGNOSIS — M5412 Radiculopathy, cervical region: Secondary | ICD-10-CM | POA: Diagnosis not present

## 2021-04-03 DIAGNOSIS — R69 Illness, unspecified: Secondary | ICD-10-CM | POA: Diagnosis not present

## 2021-04-03 DIAGNOSIS — M545 Low back pain, unspecified: Secondary | ICD-10-CM | POA: Diagnosis not present

## 2021-04-03 DIAGNOSIS — M5489 Other dorsalgia: Secondary | ICD-10-CM | POA: Diagnosis not present

## 2021-04-03 DIAGNOSIS — M546 Pain in thoracic spine: Secondary | ICD-10-CM | POA: Diagnosis not present

## 2021-04-03 DIAGNOSIS — J432 Centrilobular emphysema: Secondary | ICD-10-CM | POA: Diagnosis not present

## 2021-04-03 DIAGNOSIS — M4126 Other idiopathic scoliosis, lumbar region: Secondary | ICD-10-CM | POA: Diagnosis not present

## 2021-04-03 DIAGNOSIS — Z780 Asymptomatic menopausal state: Secondary | ICD-10-CM | POA: Diagnosis not present

## 2021-04-06 ENCOUNTER — Encounter: Payer: Self-pay | Admitting: Neurology

## 2021-04-06 ENCOUNTER — Ambulatory Visit: Payer: 59 | Admitting: Neurology

## 2021-04-06 ENCOUNTER — Other Ambulatory Visit: Payer: Self-pay

## 2021-04-06 VITALS — BP 119/76 | HR 78 | Ht 60.0 in | Wt 102.0 lb

## 2021-04-06 DIAGNOSIS — R202 Paresthesia of skin: Secondary | ICD-10-CM

## 2021-04-06 DIAGNOSIS — M79672 Pain in left foot: Secondary | ICD-10-CM | POA: Diagnosis not present

## 2021-04-06 DIAGNOSIS — M79671 Pain in right foot: Secondary | ICD-10-CM

## 2021-04-06 NOTE — Progress Notes (Signed)
[facility] Clinic Note - Initial Visit   Date: 04/06/21  Jordan Ortega MRN: [id number] DOB: 1955-09-21   Dear Dr. Tressia Miners:  Thank you for your kind referral of Jordan Ortega for consultation of bilateral feet pain. Although her history is well known to you, please allow [place] to reiterate it for the purpose of our medical record. The patient was accompanied to the clinic by self.    History of Present Illness: Jordan Ortega is a 66 y.o. right-handed female with depression, anxiety, cervical disease disease, GERD, fibromyalgia, and migraines presenting for evaluation of bilateral feet and hand pain.   She has a long history of bilateral feet and leg pain for at least the past 10 years. She has seen Dr. Manuella Ghazi at [facility] and had NCS/EMG of the legs which shows left S1 radiculopathy, no neuropathy.  MRI lumbar spine from 2021 did not show any nerve impingement of the lumbar spine.  Symptoms managed with gabapentin 327m at bedtime.  Over the past three months, she has worsening burning and needle sensation involving both feet and the back of her calf.  Symptoms are worse with prolonged standing and in the evening.  Nothing improves her symptoms.  PCP increased her gabapentin to 6026mat bedtime, but she felt groggy in the morning so now takes gabapentin 40043mt bedtime.  She also takes Cymbalta 55m75md hydrocodone for back pain.   No improvement with trial of requip.   Around the same time she developed similar sensation over the palms of the hands, which is milder and intermittent.  It occurs 1-2 times per month, lasting 4-5 hours.  She has sensation of pressure in the feet when laying in the bed.   No weakness in the hands or feet.  No imbalance or falls.  She walks unassisted.   She lives at home with husband in a two-level home.  She smokes 1ppd x 48 years.  She rarely drinks alcohol.  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    Out-side paper records, electronic medical record, and images have been reviewed where available and summarized as:  MRI brain wwo contrast 01/11/2019: 1. No evidence of acute intracranial abnormality. 2. Mild scattered T2 hyperintensity within the cerebral white matter is unchanged and nonspecific, but consistent with chronic small vessel ischemic disease.  NCS/EMG of the legs 02/16/2016:   This is an abnormal electrodiagnostic exam consistent with a chronic, non active, S1 radiculopathy on the left. No evidence of generalized peripheral neuropathy.     MRI cervical spine wo contrast 01/28/2016: 1. Severe facet arthritis on the left at C3-C4 with edema in the facets. Mild left neuroforaminal stenosis.  2. Degenerative disc disease at C6-C7 with prominent uncovertebral spur on the left with moderate left neuroforaminal stenosis.  MRI lumbar spine wo contrast 11/07/2019: 1. Remote appearing compression fracture of T12 with mild retropulsion involving the posterosuperior aspect of the vertebral body. No significant spinal or foraminal stenosis.  2. No disc protrusions, spinal or foraminal stenosis in the lumbar spine.  Lab Results  Component Value Date   TSH 1.301 08/28/2018   Lab Results  Component Value Date   ESRSEDRATE 2 06/08/2018    Past Medical History:  Diagnosis Date   Actinic keratosis    Adnexal mass    Anxiety    Barrett's esophagus    Bell palsy    left foot also ,    Bronchitis  Complication of anesthesia    difficulty waking up   COPD (chronic obstructive pulmonary disease) (HCC)    Degenerative disc disease, cervical    Depression    Dysplastic nevus 06/08/2019   Right lower abdomen, moderate atypia, limited margins free.   Dysplastic nevus 12/06/2018   right lower abdomen/moderate, limited margins free.   Environmental and seasonal allergies    GERD (gastroesophageal reflux disease)    Heart murmur    Hx   High triglycerides    Migraines     Migraines    Rapid heart rate     Past Surgical History:  Procedure Laterality Date   ABDOMINAL HYSTERECTOMY     APPENDECTOMY     CARDIAC CATHETERIZATION     [facility] pt doesn't have any stents or recall years ago 20+ yrs ago   CHOLECYSTECTOMY     COLONOSCOPY WITH PROPOFOL N/A 07/16/2016   Procedure: COLONOSCOPY WITH PROPOFOL;  Surgeon: [name], MD;  Location: [facility] ENDOSCOPY;  Service: Endoscopy;  Laterality: N/A;   COLONOSCOPY WITH PROPOFOL N/A 10/20/2018   Procedure: COLONOSCOPY WITH PROPOFOL;  Surgeon: [name], MD;  Location: [facility] ENDOSCOPY;  Service: Gastroenterology;  Laterality: N/A;   DIAGNOSTIC LAPAROSCOPY     ESOPHAGOGASTRODUODENOSCOPY (EGD) WITH PROPOFOL N/A 07/16/2016   Procedure: ESOPHAGOGASTRODUODENOSCOPY (EGD) WITH PROPOFOL;  Surgeon: [name], MD;  Location: [facility] ENDOSCOPY;  Service: Endoscopy;  Laterality: N/A;   ESOPHAGOGASTRODUODENOSCOPY (EGD) WITH PROPOFOL N/A 12/05/2018   Procedure: ESOPHAGOGASTRODUODENOSCOPY (EGD) WITH PROPOFOL;  Surgeon: [name], MD;  Location: [facility] ENDOSCOPY;  Service: Gastroenterology;  Laterality: N/A;   GALLBLADDER SURGERY  1980   GIVENS CAPSULE STUDY N/A 12/21/2018   Procedure: GIVENS CAPSULE STUDY;  Surgeon: [name], MD;  Location: [facility] ENDOSCOPY;  Service: Gastroenterology;  Laterality: N/A;   LAPAROSCOPIC SALPINGO OOPHERECTOMY Bilateral 08/27/2016   Procedure: LAPAROSCOPIC SALPINGO OOPHORECTOMY;  Surgeon: Gae Dry, MD;  Location: [facility] ORS;  Service: Gynecology;  Laterality: Bilateral;   TUBAL LIGATION  1983     Medications:  Outpatient Encounter Medications as of 04/06/2021  Medication Sig   AIMOVIG 140 MG/ML SOAJ Inject into the skin.   albuterol (PROVENTIL HFA;VENTOLIN HFA) 108 (90 Base) MCG/ACT inhaler Inhale 2 puffs into the lungs every 6 (six) hours as needed for wheezing or shortness of breath.   ALPRAZolam (XANAX) 0.5 MG tablet Take 1 tablet (0.5 mg total) by mouth 3 (three) times daily as needed for anxiety.    ascorbic acid (VITAMIN C) 500 MG tablet Take by mouth.   baclofen (LIORESAL) 10 MG tablet Take 1 tablet (10 mg total) by mouth 3 (three) times daily.   budesonide-formoterol (SYMBICORT) 160-4.5 MCG/ACT inhaler Inhale 1 puff into the lungs 2 (two) times daily.   Cholecalciferol 25 MCG (1000 UT) tablet Take by mouth.   cyclobenzaprine (FLEXERIL) 5 MG tablet Take 1 tablet (5 mg total) by mouth 3 (three) times daily as needed for muscle spasms.   DULoxetine (CYMBALTA) 60 MG capsule Take by mouth.   gabapentin (NEURONTIN) 400 MG capsule Take 400 mg by mouth 3 (three) times daily.   HYDROcodone-acetaminophen (NORCO/VICODIN) 5-325 MG tablet Take 1 tablet by mouth every 4 (four) hours as needed for moderate pain or severe pain.   loratadine (CLARITIN) 10 MG tablet Take 10 mg by mouth 2 (two) times daily.   loratadine (CLARITIN) 10 MG tablet Take by mouth.   magnesium gluconate (MAGONATE) 500 MG tablet Take by mouth.   meloxicam (MOBIC) 15 MG tablet Take by mouth.   Multiple Vitamin (MULTIVITAMIN)  tablet Take 1 tablet by mouth daily.   pantoprazole (PROTONIX) 40 MG tablet TAKE 1 TABLET BY MOUTH ONCE A DAY   Probiotic Product (PROBIOTIC-10) CHEW Chew 2 tablets by mouth daily.   promethazine (PHENERGAN) 25 MG tablet Take 1 tablet (25 mg total) by mouth every 8 (eight) hours as needed for nausea or vomiting.   simvastatin (ZOCOR) 20 MG tablet TAKE ONE TABLET BY MOUTH AT BEDTIME   Specialty Vitamins Products (BIOTIN PLUS KERATIN) 10000-100 MCG-MG TABS Take 1 tablet by mouth daily.   Teriparatide, Recombinant, 600 MCG/2.4ML SOPN Inject into the skin.   traZODone (DESYREL) 100 MG tablet Take 0.5 tablets (50 mg total) by mouth at bedtime.   [DISCONTINUED] gabapentin (NEURONTIN) 600 MG tablet Take 1 tablet (600 mg total) by mouth at bedtime. (Patient taking differently: Take 400 mg by mouth at bedtime.)   [DISCONTINUED] Abaloparatide (TYMLOS) 3120 MCG/1.56ML SOPN Inject into the skin. (Patient not taking:  Reported on 04/06/2021)   [DISCONTINUED] Ascorbic Acid (VITAMIN C) 1000 MG tablet Take 1,000 mg by mouth daily. (Patient not taking: Reported on 04/06/2021)   [DISCONTINUED] DULoxetine (CYMBALTA) 20 MG capsule Take 2 capsules (40 mg total) by mouth daily. (Patient not taking: Reported on 04/06/2021)   [DISCONTINUED] Fremanezumab-vfrm (AJOVY) 225 MG/1.5ML SOAJ Inject 225 mg into the skin every 30 (thirty) days. (Patient not taking: Reported on 04/06/2021)   [DISCONTINUED] lubiprostone (AMITIZA) 8 MCG capsule Take by mouth. (Patient not taking: Reported on 04/06/2021)   [DISCONTINUED] meloxicam (MOBIC) 7.5 MG tablet Take 1 tablet (7.5 mg total) by mouth in the morning and at bedtime. (Patient not taking: Reported on 04/06/2021)   [DISCONTINUED] mirtazapine (REMERON) 45 MG tablet Take 1 tablet (45 mg total) by mouth at bedtime. (Patient not taking: Reported on 04/06/2021)   [DISCONTINUED] mirtazapine (REMERON) 45 MG tablet Take by mouth. (Patient not taking: Reported on 04/06/2021)   [DISCONTINUED] pantoprazole (PROTONIX) 40 MG tablet Take 1 tablet by mouth 2 (two) times daily. (Patient not taking: Reported on 04/06/2021)   [DISCONTINUED] QUEtiapine (SEROQUEL) 25 MG tablet Take 3 tablets (75 mg total) by mouth at bedtime. (Patient not taking: Reported on 04/06/2021)   [DISCONTINUED] Rimegepant Sulfate (NURTEC) 75 MG TBDP Take 75 mg by mouth every other day as needed (migraine). (Patient not taking: Reported on 04/06/2021)   No facility-administered encounter medications on file as of 04/06/2021.    Allergies:  Allergies  Allergen Reactions   Diazepam Other (See Comments)    Other reaction(s): Hallucination Patient states "it makes me crazy" REACTION: Behavioral problems   Ciprofloxacin Other (See Comments)    Other reaction(s): Unknown Loopy   Codeine Hives and Itching    REACTION: Dizziness   Metoprolol     Migraine/headaches    Morphine Hives    Other reaction(s): Unknown   Niacin And Related Other (See  Comments)    flushing   Topiramate Other (See Comments)   Toradol [Ketorolac Tromethamine] Itching    Tolerates when taken with Benadryl   Doxycycline Diarrhea    Family History: Family History  Problem Relation Age of Onset   Breast cancer Mother 58   Skin cancer Mother    Hypothyroidism Mother    Parkinson's disease Mother    Osteoporosis Mother    Colon cancer Father    Lung cancer Father    Heart disease Father    Migraines Sister    Breast cancer Maternal Aunt        post men.   Diabetes Paternal Uncle  Diabetes Paternal Grandmother    Hypertension Paternal Grandmother    Stroke Paternal Grandmother    Dementia Paternal Grandfather    Breast cancer Cousin 14       maternal    Multiple myeloma Cousin     Social History: Social History   Tobacco Use   Smoking status: Every Day    Packs/day: 1.00    Years: 46.00    Pack years: 46.00    Types: Cigarettes   Smokeless tobacco: Never   Tobacco comments:    .5ppd currently    04/06/2021- Patient states she has cut back to 1 pack a day   Vaping Use   Vaping Use: Never used  Substance Use Topics   Alcohol use: Not Currently    Alcohol/week: 0.0 standard drinks   Drug use: No   Social History   Social History Narrative   Right Handed    Lives in a two story home. Lives with husband   6 grand children     Vital Signs:  BP 119/76    Pulse 78    Ht 5' (1.524 m)    Wt 102 lb (46.3 kg)    SpO2 99%    BMI 19.92 kg/m   Neurological Exam: MENTAL STATUS including orientation to time, place, person, recent and remote memory, attention span and concentration, language, and fund of knowledge is normal.  Speech is not dysarthric.  CRANIAL NERVES: II:  No visual field defects.    III-IV-VI: Pupils equal round and reactive to light.  Normal conjugate, extra-ocular eye movements in all directions of gaze.  No nystagmus.  No ptosis.   V:  Normal facial sensation.    VII:  Normal facial symmetry and movements.   VIII:   Normal hearing and vestibular function.   IX-X:  Normal palatal movement.   XI:  Normal shoulder shrug and head rotation.   XII:  Normal tongue strength and range of motion, no deviation or fasciculation.  MOTOR:  No atrophy, fasciculations or abnormal movements.  No pronator drift.   Upper Extremity:  Right  Left  Deltoid  5/5   5/5   Biceps  5/5   5/5   Triceps  5/5   5/5   Wrist extensors  5/5   5/5   Wrist flexors  5/5   5/5   Finger extensors  5/5   5/5   Finger flexors  5/5   5/5   Dorsal interossei  5/5   5/5   Abductor pollicis  5/5   5/5   Tone (Ashworth scale)  0  0   Lower Extremity:  Right  Left  Hip flexors  5/5   5/5   Knee flexors  5/5   5/5   Knee extensors  5/5   5/5   Dorsiflexors  5/5   5/5   Plantarflexors  5/5   5/5   Toe extensors  5/5   5/5   Toe flexors  5/5   5/5   Tone (Ashworth scale)  0  0   MSRs:  Right        Left                  brachioradialis 2+  2+  biceps 2+  2+  triceps 2+  2+  patellar 2+  2+  ankle jerk 2+  2+  Hoffman no  no  plantar response down  down   SENSORY:  Normal and symmetric perception of light touch,  pinprick, vibration, and proprioception.  Romberg's sign absent.   COORDINATION/GAIT: Normal finger-to- nose-finger and heel-to-shin.  Intact rapid alternating movements bilaterally.  Able to rise from a chair without using arms.  Gait narrow based and stable. Tandem and stressed gait intact.    IMPRESSION: Chronic bilateral feet pain and paresthesias worsening over the past 3 months.  Prior testing did not show evidence of large fiber neuropathy and MRI lumbar spine was unremarkable.  Neurological exam is normal, specifically no exam findings to suggest neuropathy or radiculopathy. Recommend that we evaluate her symptoms with NCS/EMG of the legs.  Continue pain management as per PCP. Further recommendations pending results.   Thank you for allowing me to participate in patient's care.  If I can answer any additional  questions, I would be pleased to do so.    Sincerely,    Marqus Macphee K. Posey Pronto, DO

## 2021-04-06 NOTE — Patient Instructions (Addendum)

## 2021-04-10 DIAGNOSIS — M5136 Other intervertebral disc degeneration, lumbar region: Secondary | ICD-10-CM | POA: Diagnosis not present

## 2021-04-10 DIAGNOSIS — M542 Cervicalgia: Secondary | ICD-10-CM | POA: Diagnosis not present

## 2021-04-10 DIAGNOSIS — M62830 Muscle spasm of back: Secondary | ICD-10-CM | POA: Diagnosis not present

## 2021-04-14 ENCOUNTER — Other Ambulatory Visit: Payer: Self-pay | Admitting: *Deleted

## 2021-04-14 DIAGNOSIS — F17210 Nicotine dependence, cigarettes, uncomplicated: Secondary | ICD-10-CM

## 2021-04-14 DIAGNOSIS — Z87891 Personal history of nicotine dependence: Secondary | ICD-10-CM

## 2021-04-20 ENCOUNTER — Other Ambulatory Visit: Payer: Self-pay | Admitting: Family Medicine

## 2021-04-20 DIAGNOSIS — M5416 Radiculopathy, lumbar region: Secondary | ICD-10-CM

## 2021-04-23 ENCOUNTER — Other Ambulatory Visit: Payer: Self-pay | Admitting: Physician Assistant

## 2021-04-23 ENCOUNTER — Ambulatory Visit
Admission: RE | Admit: 2021-04-23 | Discharge: 2021-04-23 | Disposition: A | Discharge status: Home / Self Care | Payer: 59 | Source: Ambulatory Visit | Attending: Acute Care | Admitting: Acute Care

## 2021-04-23 DIAGNOSIS — R69 Illness, unspecified: Secondary | ICD-10-CM | POA: Diagnosis not present

## 2021-04-23 DIAGNOSIS — F3175 Bipolar disorder, in partial remission, most recent episode depressed: Secondary | ICD-10-CM

## 2021-04-23 DIAGNOSIS — F339 Major depressive disorder, recurrent, unspecified: Secondary | ICD-10-CM

## 2021-04-23 DIAGNOSIS — F17210 Nicotine dependence, cigarettes, uncomplicated: Secondary | ICD-10-CM

## 2021-04-23 DIAGNOSIS — G43409 Hemiplegic migraine, not intractable, without status migrainosus: Secondary | ICD-10-CM

## 2021-04-23 DIAGNOSIS — F39 Unspecified mood [affective] disorder: Secondary | ICD-10-CM

## 2021-04-23 DIAGNOSIS — R634 Abnormal weight loss: Secondary | ICD-10-CM

## 2021-04-23 DIAGNOSIS — G43009 Migraine without aura, not intractable, without status migrainosus: Secondary | ICD-10-CM

## 2021-04-23 DIAGNOSIS — Z87891 Personal history of nicotine dependence: Secondary | ICD-10-CM

## 2021-04-23 DIAGNOSIS — K581 Irritable bowel syndrome with constipation: Secondary | ICD-10-CM

## 2021-04-24 ENCOUNTER — Ambulatory Visit
Admission: RE | Admit: 2021-04-24 | Discharge: 2021-04-24 | Disposition: A | Discharge status: Home / Self Care | Payer: 59 | Source: Ambulatory Visit | Attending: Family Medicine | Admitting: Family Medicine

## 2021-04-24 DIAGNOSIS — M5416 Radiculopathy, lumbar region: Secondary | ICD-10-CM | POA: Diagnosis not present

## 2021-04-24 DIAGNOSIS — M5136 Other intervertebral disc degeneration, lumbar region: Secondary | ICD-10-CM | POA: Diagnosis not present

## 2021-04-27 ENCOUNTER — Other Ambulatory Visit: Payer: Self-pay | Admitting: Acute Care

## 2021-04-27 DIAGNOSIS — F17210 Nicotine dependence, cigarettes, uncomplicated: Secondary | ICD-10-CM

## 2021-04-27 DIAGNOSIS — Z87891 Personal history of nicotine dependence: Secondary | ICD-10-CM

## 2021-04-27 DIAGNOSIS — M5416 Radiculopathy, lumbar region: Secondary | ICD-10-CM | POA: Diagnosis not present

## 2021-04-27 DIAGNOSIS — M5136 Other intervertebral disc degeneration, lumbar region: Secondary | ICD-10-CM | POA: Diagnosis not present

## 2021-04-27 DIAGNOSIS — M62830 Muscle spasm of back: Secondary | ICD-10-CM | POA: Diagnosis not present

## 2021-04-30 DIAGNOSIS — R69 Illness, unspecified: Secondary | ICD-10-CM | POA: Diagnosis not present

## 2021-04-30 DIAGNOSIS — J432 Centrilobular emphysema: Secondary | ICD-10-CM | POA: Diagnosis not present

## 2021-04-30 DIAGNOSIS — M5412 Radiculopathy, cervical region: Secondary | ICD-10-CM | POA: Diagnosis not present

## 2021-04-30 DIAGNOSIS — M549 Dorsalgia, unspecified: Secondary | ICD-10-CM | POA: Diagnosis not present

## 2021-05-05 ENCOUNTER — Encounter: Payer: 59 | Admitting: Neurology

## 2021-05-06 DIAGNOSIS — M5416 Radiculopathy, lumbar region: Secondary | ICD-10-CM | POA: Diagnosis not present

## 2021-05-06 DIAGNOSIS — M48062 Spinal stenosis, lumbar region with neurogenic claudication: Secondary | ICD-10-CM | POA: Diagnosis not present

## 2021-06-03 DIAGNOSIS — M62830 Muscle spasm of back: Secondary | ICD-10-CM | POA: Diagnosis not present

## 2021-06-03 DIAGNOSIS — M545 Low back pain, unspecified: Secondary | ICD-10-CM | POA: Diagnosis not present

## 2021-06-03 DIAGNOSIS — M47816 Spondylosis without myelopathy or radiculopathy, lumbar region: Secondary | ICD-10-CM | POA: Diagnosis not present

## 2021-06-03 DIAGNOSIS — M5416 Radiculopathy, lumbar region: Secondary | ICD-10-CM | POA: Diagnosis not present

## 2021-06-03 DIAGNOSIS — M5136 Other intervertebral disc degeneration, lumbar region: Secondary | ICD-10-CM | POA: Diagnosis not present

## 2021-06-03 DIAGNOSIS — M48062 Spinal stenosis, lumbar region with neurogenic claudication: Secondary | ICD-10-CM | POA: Diagnosis not present

## 2021-06-04 ENCOUNTER — Other Ambulatory Visit: Payer: Self-pay | Admitting: Family Medicine

## 2021-06-04 ENCOUNTER — Ambulatory Visit
Admission: RE | Admit: 2021-06-04 | Discharge: 2021-06-04 | Disposition: A | Discharge status: Home / Self Care | Payer: 59 | Source: Ambulatory Visit | Attending: Family Medicine | Admitting: Family Medicine

## 2021-06-04 DIAGNOSIS — R112 Nausea with vomiting, unspecified: Secondary | ICD-10-CM | POA: Diagnosis not present

## 2021-06-04 DIAGNOSIS — R1013 Epigastric pain: Secondary | ICD-10-CM

## 2021-06-04 DIAGNOSIS — I700 Atherosclerosis of aorta: Secondary | ICD-10-CM | POA: Diagnosis not present

## 2021-06-04 DIAGNOSIS — R140 Abdominal distension (gaseous): Secondary | ICD-10-CM

## 2021-06-04 DIAGNOSIS — R109 Unspecified abdominal pain: Secondary | ICD-10-CM | POA: Diagnosis not present

## 2021-06-15 DIAGNOSIS — G43801 Other migraine, not intractable, with status migrainosus: Secondary | ICD-10-CM | POA: Diagnosis not present

## 2021-06-15 DIAGNOSIS — K5900 Constipation, unspecified: Secondary | ICD-10-CM | POA: Diagnosis not present

## 2021-06-17 DIAGNOSIS — K5900 Constipation, unspecified: Secondary | ICD-10-CM | POA: Diagnosis not present

## 2021-06-22 ENCOUNTER — Emergency Department: Payer: 59

## 2021-06-22 ENCOUNTER — Encounter: Payer: Self-pay | Admitting: Emergency Medicine

## 2021-06-22 DIAGNOSIS — R143 Flatulence: Secondary | ICD-10-CM | POA: Insufficient documentation

## 2021-06-22 DIAGNOSIS — Z7951 Long term (current) use of inhaled steroids: Secondary | ICD-10-CM | POA: Insufficient documentation

## 2021-06-22 DIAGNOSIS — R748 Abnormal levels of other serum enzymes: Secondary | ICD-10-CM | POA: Insufficient documentation

## 2021-06-22 DIAGNOSIS — J449 Chronic obstructive pulmonary disease, unspecified: Secondary | ICD-10-CM | POA: Insufficient documentation

## 2021-06-22 DIAGNOSIS — R110 Nausea: Secondary | ICD-10-CM | POA: Diagnosis not present

## 2021-06-22 DIAGNOSIS — I700 Atherosclerosis of aorta: Secondary | ICD-10-CM | POA: Diagnosis not present

## 2021-06-22 DIAGNOSIS — R1084 Generalized abdominal pain: Secondary | ICD-10-CM | POA: Diagnosis not present

## 2021-06-22 DIAGNOSIS — R109 Unspecified abdominal pain: Secondary | ICD-10-CM | POA: Diagnosis not present

## 2021-06-22 LAB — CBC WITH DIFFERENTIAL/PLATELET
Abs Immature Granulocytes: 0.01 10*3/uL (ref 0.00–0.07)
Basophils Absolute: 0.1 10*3/uL (ref 0.0–0.1)
Basophils Relative: 1 %
Eosinophils Absolute: 0.3 10*3/uL (ref 0.0–0.5)
Eosinophils Relative: 3 %
HCT: 39.5 % (ref 36.0–46.0)
Hemoglobin: 13.3 g/dL (ref 12.0–15.0)
Immature Granulocytes: 0 %
Lymphocytes Relative: 41 %
Lymphs Abs: 3.1 10*3/uL (ref 0.7–4.0)
MCH: 31.8 pg (ref 26.0–34.0)
MCHC: 33.7 g/dL (ref 30.0–36.0)
MCV: 94.5 fL (ref 80.0–100.0)
Monocytes Absolute: 0.6 10*3/uL (ref 0.1–1.0)
Monocytes Relative: 8 %
Neutro Abs: 3.5 10*3/uL (ref 1.7–7.7)
Neutrophils Relative %: 47 %
Platelets: 286 10*3/uL (ref 150–400)
RBC: 4.18 MIL/uL (ref 3.87–5.11)
RDW: 13.9 % (ref 11.5–15.5)
WBC: 7.5 10*3/uL (ref 4.0–10.5)
nRBC: 0 % (ref 0.0–0.2)

## 2021-06-22 LAB — COMPREHENSIVE METABOLIC PANEL
ALT: 18 U/L (ref 0–44)
AST: 22 U/L (ref 15–41)
Albumin: 4.1 g/dL (ref 3.5–5.0)
Alkaline Phosphatase: 62 U/L (ref 38–126)
Anion gap: 10 (ref 5–15)
BUN: 21 mg/dL (ref 8–23)
CO2: 25 mmol/L (ref 22–32)
Calcium: 9.7 mg/dL (ref 8.9–10.3)
Chloride: 105 mmol/L (ref 98–111)
Creatinine, Ser: 0.98 mg/dL (ref 0.44–1.00)
GFR, Estimated: >60 mL/min (ref >60)
Glucose, Bld: 94 mg/dL (ref 70–99)
Potassium: 3.9 mmol/L (ref 3.5–5.1)
Sodium: 140 mmol/L (ref 135–145)
Total Bilirubin: 0.6 mg/dL (ref 0.3–1.2)
Total Protein: 6.9 g/dL (ref 6.5–8.1)

## 2021-06-22 LAB — URINALYSIS, ROUTINE W REFLEX MICROSCOPIC
Bilirubin Urine: NEGATIVE
Glucose, UA: NEGATIVE mg/dL
Hgb urine dipstick: NEGATIVE
Ketones, ur: NEGATIVE mg/dL
Leukocytes,Ua: NEGATIVE
Nitrite: NEGATIVE
Protein, ur: NEGATIVE mg/dL
Specific Gravity, Urine: 1.028 (ref 1.005–1.030)
pH: 5 (ref 5.0–8.0)

## 2021-06-22 LAB — LIPASE, BLOOD: Lipase: 65 U/L — ABNORMAL HIGH (ref 11–51)

## 2021-06-22 MED ORDER — IOHEXOL 300 MG/ML  SOLN
75.0000 mL | Freq: Once | INTRAMUSCULAR | Status: AC | PRN
  Administered 2021-06-22: 75 mL via INTRAVENOUS

## 2021-06-22 MED ORDER — IOHEXOL 300 MG/ML  SOLN
100.0000 mL | Freq: Once | INTRAMUSCULAR | Status: AC | PRN
  Administered 2021-06-22: 1 mL via INTRAVENOUS

## 2021-06-22 MED ORDER — ONDANSETRON HCL 4 MG/2ML IJ SOLN
4.0000 mg | Freq: Once | INTRAMUSCULAR | Status: AC
  Administered 2021-06-22: 4 mg via INTRAVENOUS
  Filled 2021-06-22: qty 2

## 2021-06-22 NOTE — ED Triage Notes (Signed)
Pt presents via POV with complaint of lower abdominal pain starting yesterday - she notes a hx of constipation. She notes taking a stool softener yesterday which exacerbated the pain. Per family, she has been dealing with the pain and discomfort for the last 3 weeks. Denies diarrhea.

## 2021-06-22 NOTE — ED Provider Triage Note (Signed)
Emergency Medicine Provider Triage Evaluation Note  Jordan Ortega , a 66 y.o. female  was evaluated in triage.  Pt complains of severe abdominal pain.  Patient reports severe central abdominal pain that began today.  Patient has been dealing with constipation, has been seen recently for same.  Patient was taking medication for her constipation and had a large bowel movement last night.  She states that the pain is sharp, severe in the central portion of her abdomen at this time..  Review of Systems  Positive: Abdominal pain, nausea.  Constipation Negative: Hematochezia.  Fever, chills, urinary symptoms.  Physical Exam  Ht 5' (1.524 m)   Wt 43.5 kg   BMI 18.75 kg/m  Gen:   Awake, no distress   Resp:  Normal effort  MSK:   Moves extremities without difficulty  Other:  Diffuse abdominal pain with guarding.  No distention noted.  Medical Decision Making  Medically screening exam initiated at 9:15 PM.  Appropriate orders placed.  Nandita Mathenia was informed that the remainder of the evaluation will be completed by another provider, this initial triage assessment does not replace that evaluation, and the importance of remaining in the ED until their evaluation is complete.  Patient presents with central abdominal pain, recent constipation.  Patient has been taking laxatives.  She did have a bowel movement last night but states that the pain is sharp and severe today.  Patient will have labs, imaging.   Brynda Peon 06/22/21 [id number]

## 2021-06-23 ENCOUNTER — Emergency Department
Admission: EM | Admit: 2021-06-23 | Discharge: 2021-06-23 | Disposition: A | Discharge status: Home / Self Care | Payer: 59 | Attending: Emergency Medicine | Admitting: Emergency Medicine

## 2021-06-23 DIAGNOSIS — R1084 Generalized abdominal pain: Secondary | ICD-10-CM

## 2021-06-23 MED ORDER — ALUM & MAG HYDROXIDE-SIMETH 200-200-20 MG/5ML PO SUSP
30.0000 mL | Freq: Once | ORAL | Status: AC
  Administered 2021-06-23: 30 mL via ORAL
  Filled 2021-06-23: qty 30

## 2021-06-23 MED ORDER — DICYCLOMINE HCL 20 MG PO TABS: 20.0000 mg | ORAL_TABLET | Freq: Three times a day (TID) | ORAL | 0 refills | Status: DC | PRN

## 2021-06-23 MED ORDER — ONDANSETRON 4 MG PO TBDP: 4.0000 mg | ORAL_TABLET | Freq: Four times a day (QID) | ORAL | 0 refills | Status: DC | PRN

## 2021-06-23 MED ORDER — LIDOCAINE VISCOUS HCL 2 % MT SOLN
15.0000 mL | Freq: Once | OROMUCOSAL | Status: AC
  Administered 2021-06-23: 15 mL via ORAL
  Filled 2021-06-23: qty 15

## 2021-06-23 MED ORDER — PANTOPRAZOLE SODIUM 40 MG PO TBEC
40.0000 mg | DELAYED_RELEASE_TABLET | Freq: Once | ORAL | Status: AC
  Administered 2021-06-23: 40 mg via ORAL
  Filled 2021-06-23: qty 1

## 2021-06-23 MED ORDER — DICYCLOMINE HCL 10 MG/ML IM SOLN
20.0000 mg | Freq: Once | INTRAMUSCULAR | Status: AC
  Administered 2021-06-23: 20 mg via INTRAMUSCULAR
  Filled 2021-06-23: qty 2

## 2021-06-23 MED ORDER — PANTOPRAZOLE SODIUM 40 MG PO TBEC: 40.0000 mg | DELAYED_RELEASE_TABLET | Freq: Every day | ORAL | 1 refills | Status: DC

## 2021-06-23 NOTE — ED Provider Notes (Signed)
[facility] Provider Note    Event Date/Time   First MD Initiated Contact with Patient 06/23/21 0117     (approximate)   History   Abdominal Pain   HPI  Jordan Ortega is a 66 y.o. female with history of COPD, GERD, migraines, anxiety presents to the emergency department with her husband for complaints of diffuse abdominal pain for 3 to 4 weeks.  Patient has had previous hysterectomy, appendectomy, cholecystectomy, bilateral salpingo-oophorectomy, tubal ligation.  She states pain is worse after eating and after taking Dulcolax.  She states she was told by her outpatient doctors that she was constipated due to findings on x-ray and CT scan that she had done as an outpatient last week.  She is passing gas.  No fevers.  She has had nausea without vomiting.  No GU symptoms.  She states since taking the Dulcolax she has had multiple loose bowel movements.   History provided by patient and husband.    Past Medical History:  Diagnosis Date   Actinic keratosis    Adnexal mass    Anxiety    Barrett's esophagus    Bell palsy    left foot also ,    Bronchitis    Complication of anesthesia    difficulty waking up   COPD (chronic obstructive pulmonary disease) (HCC)    Degenerative disc disease, cervical    Depression    Dysplastic nevus 06/08/2019   Right lower abdomen, moderate atypia, limited margins free.   Dysplastic nevus 12/06/2018   right lower abdomen/moderate, limited margins free.   Environmental and seasonal allergies    GERD (gastroesophageal reflux disease)    Heart murmur    Hx   High triglycerides    Migraines    Migraines    Rapid heart rate     Past Surgical History:  Procedure Laterality Date   ABDOMINAL HYSTERECTOMY     APPENDECTOMY     CARDIAC CATHETERIZATION     [facility] pt doesn't have any stents or recall years ago 20+ yrs ago   CHOLECYSTECTOMY     COLONOSCOPY WITH PROPOFOL N/A 07/16/2016   Procedure: COLONOSCOPY WITH  PROPOFOL;  Surgeon: [name], MD;  Location: [facility] ENDOSCOPY;  Service: Endoscopy;  Laterality: N/A;   COLONOSCOPY WITH PROPOFOL N/A 10/20/2018   Procedure: COLONOSCOPY WITH PROPOFOL;  Surgeon: [name], MD;  Location: [facility] ENDOSCOPY;  Service: Gastroenterology;  Laterality: N/A;   DIAGNOSTIC LAPAROSCOPY     ESOPHAGOGASTRODUODENOSCOPY (EGD) WITH PROPOFOL N/A 07/16/2016   Procedure: ESOPHAGOGASTRODUODENOSCOPY (EGD) WITH PROPOFOL;  Surgeon: [name], MD;  Location: [facility] ENDOSCOPY;  Service: Endoscopy;  Laterality: N/A;   ESOPHAGOGASTRODUODENOSCOPY (EGD) WITH PROPOFOL N/A 12/05/2018   Procedure: ESOPHAGOGASTRODUODENOSCOPY (EGD) WITH PROPOFOL;  Surgeon: [name], MD;  Location: [facility] ENDOSCOPY;  Service: Gastroenterology;  Laterality: N/A;   GALLBLADDER SURGERY  1980   GIVENS CAPSULE STUDY N/A 12/21/2018   Procedure: GIVENS CAPSULE STUDY;  Surgeon: [name], MD;  Location: [facility] ENDOSCOPY;  Service: Gastroenterology;  Laterality: N/A;   LAPAROSCOPIC SALPINGO OOPHERECTOMY Bilateral 08/27/2016   Procedure: LAPAROSCOPIC SALPINGO OOPHORECTOMY;  Surgeon: Gae Dry, MD;  Location: [facility] ORS;  Service: Gynecology;  Laterality: Bilateral;   TUBAL LIGATION  1983    MEDICATIONS:  Prior to Admission medications   Medication Sig Start Date End Date Taking? Authorizing Provider  AIMOVIG 140 MG/ML SOAJ Inject into the skin. 04/01/21   [provider]  albuterol (PROVENTIL HFA;VENTOLIN HFA) 108 (90 Base) MCG/ACT inhaler Inhale 2 puffs into  the lungs every 6 (six) hours as needed for wheezing or shortness of breath. 11/01/17   Chrismon, Vickki Muff, PA-C  ALPRAZolam (XANAX) 0.5 MG tablet Take 1 tablet (0.5 mg total) by mouth 3 (three) times daily as needed for anxiety. 11/19/16   Chrismon, Vickki Muff, PA-C  ascorbic acid (VITAMIN C) 500 MG tablet Take by mouth.    [provider]  baclofen (LIORESAL) 10 MG tablet Take 1 tablet (10 mg total) by mouth 3 (three) times daily. 04/21/20    Mar Daring, PA-C  budesonide-formoterol (SYMBICORT) 160-4.5 MCG/ACT inhaler Inhale 1 puff into the lungs 2 (two) times daily. 03/21/17   Chrismon, Vickki Muff, PA-C  Cholecalciferol 25 MCG (1000 UT) tablet Take by mouth.    [provider]  cyclobenzaprine (FLEXERIL) 5 MG tablet Take 1 tablet (5 mg total) by mouth 3 (three) times daily as needed for muscle spasms. 04/21/20   Mar Daring, PA-C  DULoxetine (CYMBALTA) 60 MG capsule Take by mouth. 08/21/20 08/21/21  [provider]  gabapentin (NEURONTIN) 400 MG capsule Take 400 mg by mouth 3 (three) times daily.    [provider]  HYDROcodone-acetaminophen (NORCO/VICODIN) 5-325 MG tablet Take 1 tablet by mouth every 4 (four) hours as needed for moderate pain or severe pain. 04/21/20   Mar Daring, PA-C  loratadine (CLARITIN) 10 MG tablet Take 10 mg by mouth 2 (two) times daily.    [provider]  loratadine (CLARITIN) 10 MG tablet Take by mouth.    [provider]  magnesium gluconate (MAGONATE) 500 MG tablet Take by mouth.    [provider]  meloxicam (MOBIC) 15 MG tablet Take by mouth.    [provider]  Multiple Vitamin (MULTIVITAMIN) tablet Take 1 tablet by mouth daily.    [provider]  pantoprazole (PROTONIX) 40 MG tablet TAKE 1 TABLET BY MOUTH ONCE A DAY 07/24/20   Bacigalupo, Dionne Bucy, MD  Probiotic Product (PROBIOTIC-10) CHEW Chew 2 tablets by mouth daily.    [provider]  promethazine (PHENERGAN) 25 MG tablet Take 1 tablet (25 mg total) by mouth every 8 (eight) hours as needed for nausea or vomiting. 10/17/18   Mar Daring, PA-C  simvastatin (ZOCOR) 20 MG tablet TAKE ONE TABLET BY MOUTH AT BEDTIME 02/08/20   Mar Daring, PA-C  Specialty Vitamins Products (BIOTIN PLUS KERATIN) 10000-100 MCG-MG TABS Take 1 tablet by mouth daily.    [provider]  traZODone (DESYREL) 100 MG tablet Take 0.5 tablets (50 mg total)  by mouth at bedtime. 04/21/20   Mar Daring, PA-C    Physical Exam   Triage Vital Signs: ED Triage Vitals  Enc Vitals Group     BP 06/22/21 2116 (!) 110/93     Pulse Rate 06/22/21 2116 86     Resp 06/22/21 2116 20     Temp 06/22/21 2116 98.5 F (36.9 C)     Temp Source 06/22/21 2116 Oral     SpO2 06/22/21 2116 100 %     Weight 06/22/21 2114 96 lb (43.5 kg)     Height 06/22/21 2114 5' (1.524 m)     Head Circumference --      Peak Flow --      Pain Score 06/22/21 2114 10     Pain Loc --      Pain Edu? --      Excl. in [place]? --     Most recent vital signs: Vitals:  06/22/21 2116 06/23/21 0148  BP: (!) 110/93 132/85  Pulse: 86 (!) 59  Resp: 20 17  Temp: 98.5 F (36.9 C) 98.5 F (36.9 C)  SpO2: 100% 100%    CONSTITUTIONAL: Alert and oriented and responds appropriately to questions.  Tearful HEAD: Normocephalic, atraumatic EYES: Conjunctivae clear, pupils appear equal, sclera nonicteric ENT: normal nose; moist mucous membranes NECK: Supple, normal ROM CARD: RRR; S1 and S2 appreciated; no murmurs, no clicks, no rubs, no gallops RESP: Normal chest excursion without splinting or tachypnea; breath sounds clear and equal bilaterally; no wheezes, no rhonchi, no rales, no hypoxia or respiratory distress, speaking full sentences ABD/GI: Normal bowel sounds; non-distended; soft, non-tender, no rebound, no guarding, no peritoneal signs BACK: The back appears normal EXT: Normal ROM in all joints; no deformity noted, no edema; no cyanosis SKIN: Normal color for age and race; warm; no rash on exposed skin NEURO: Moves all extremities equally, normal speech PSYCH: The patient's mood and manner are appropriate.   ED Results / Procedures / Treatments   LABS: (all labs ordered are listed, but only abnormal results are displayed) Labs Reviewed  URINALYSIS, ROUTINE W REFLEX MICROSCOPIC - Abnormal; Notable for the following components:      Result Value   Color, Urine YELLOW  (*)    APPearance HAZY (*)    All other components within normal limits  LIPASE, BLOOD - Abnormal; Notable for the following components:   Lipase 65 (*)    All other components within normal limits  COMPREHENSIVE METABOLIC PANEL  CBC WITH DIFFERENTIAL/PLATELET     EKG:   RADIOLOGY: My personal review and interpretation of imaging: CT scan shows no acute abnormality.  I have personally reviewed all radiology reports.   CT ABDOMEN PELVIS W CONTRAST  Result Date: 06/22/2021 CLINICAL DATA:  Abdominal pain. EXAM: CT ABDOMEN AND PELVIS WITH CONTRAST TECHNIQUE: Multidetector CT imaging of the abdomen and pelvis was performed using the standard protocol following bolus administration of intravenous contrast. RADIATION DOSE REDUCTION: This exam was performed according to the departmental dose-optimization program which includes automated exposure control, adjustment of the mA and/or kV according to patient size and/or use of iterative reconstruction technique. CONTRAST:  3m OMNIPAQUE IOHEXOL 300 MG/ML  SOLN COMPARISON:  CT abdomen pelvis dated 06/04/2021. FINDINGS: Lower chest: The visualized lung bases are clear. No intra-abdominal free air or free fluid. Hepatobiliary: The liver is unremarkable. Mild intrahepatic biliary dilatation, likely post cholecystectomy. Pancreas: Unremarkable. No pancreatic ductal dilatation or surrounding inflammatory changes. Spleen: Normal in size without focal abnormality. Adrenals/Urinary Tract: The adrenal glands unremarkable. The kidneys, visualized ureters, and urinary bladder appear unremarkable. Stomach/Bowel: There is no bowel obstruction or active inflammation. Appendectomy. Vascular/Lymphatic: Mild aortoiliac atherosclerotic disease. The IVC is unremarkable. No portal venous gas. There is no adenopathy. Reproductive: Hysterectomy.  No adnexal masses. Other: None Musculoskeletal: Osteopenia with degenerative changes of the spine. No acute osseous pathology. Old T12  compression fracture. IMPRESSION: 1. No acute intra-abdominal or pelvic pathology. 2. Aortic Atherosclerosis (ICD10-I70.0). Electronically Signed   By: AAnner CreteM.D.   On: 06/22/2021 22:14     PROCEDURES:  Critical Care performed: No      Procedures    IMPRESSION / MDM / ASSESSMENT AND PLAN / ED COURSE  I reviewed the triage vital signs and the nursing notes.    Patient here with 3+ weeks of abdominal pain.  Was being treated for constipation as an outpatient.    DIFFERENTIAL DIAGNOSIS (includes but not limited to):  Constipation, bowel obstruction, chronic abdominal pain, pain due to peristalsis from using a laxative, colitis, diverticulitis, GERD   PLAN: We will obtain CBC, CMP, lipase, urinalysis, CT of the abdomen pelvis.  Will give Protonix, GI cocktail, Bentyl, Zofran.   MEDICATIONS GIVEN IN ED: Medications  ondansetron (ZOFRAN) injection 4 mg (4 mg Intravenous Given 06/22/21 2122)  iohexol (OMNIPAQUE) 300 MG/ML solution 100 mL (1 mL Intravenous Contrast Given 06/22/21 2159)  iohexol (OMNIPAQUE) 300 MG/ML solution 75 mL (75 mLs Intravenous Contrast Given 06/22/21 2205)  alum & mag hydroxide-simeth (MAALOX/MYLANTA) 200-200-20 MG/5ML suspension 30 mL (30 mLs Oral Given 06/23/21 0142)    And  lidocaine (XYLOCAINE) 2 % viscous mouth solution 15 mL (15 mLs Oral Given 06/23/21 0142)  pantoprazole (PROTONIX) EC tablet 40 mg (40 mg Oral Given 06/23/21 0142)  dicyclomine (BENTYL) injection 20 mg (20 mg Intramuscular Given 06/23/21 0230)     ED COURSE: Patient's labs show no leukocytosis.  Normal electrolytes and LFTs.  Lipase minimally elevated at 65.  Urine shows no dehydration or infection.  CT of the abdomen pelvis reviewed by myself and radiologist and shows no acute abnormality.  No constipation or bowel obstruction.  Have recommended close follow-up with her gastroenterologist given pain ongoing for several weeks with no emergent findings today.   Patient reports  feeling better after GI cocktail, Protonix, Bentyl.  Tolerating p.o.  Will discharge with prescription for Bentyl, refill her Protonix and give prescription for Zofran.  Have again encouraged her to increase water intake and fiber to prevent constipation.  Recommended medication such as MiraLAX or Colace over-the-counter which may be milder than Dulcolax to help with constipation in the future.  I also encouraged her to follow-up with gastroenterology if symptoms or not improving as she may need an endoscopy, colonoscopy.  Patient and husband comfortable with this plan.  She will also follow-up closely with her primary care doctor.   At this time, I do not feel there is any life-threatening condition present. I reviewed all nursing notes, vitals, pertinent previous records.  All lab and urine results, EKGs, imaging ordered have been independently reviewed and interpreted by myself.  I reviewed all available radiology reports from any imaging ordered this visit.  Based on my assessment, I feel the patient is safe to be discharged home without further emergent workup and can continue workup as an outpatient as needed. Discussed all findings, treatment plan as well as usual and customary return precautions with patient and husband.  They verbalize understanding and are comfortable with this plan.  Outpatient follow-up has been provided as needed.  All questions have been answered.   CONSULTS: Admission considered but given reassuring work-up, patient is appropriate for discharge with outpatient management.   OUTSIDE RECORDS REVIEWED: Reviewed patient's last office visit with Lang Snow on 06/15/2021.         FINAL CLINICAL IMPRESSION(S) / ED DIAGNOSES   Final diagnoses:  Generalized abdominal pain     Rx / DC Orders   ED Discharge Orders          Ordered    pantoprazole (PROTONIX) 40 MG tablet  Daily        06/23/21 0244    dicyclomine (BENTYL) 20 MG tablet  Every 8 hours PRN         06/23/21 0244    ondansetron (ZOFRAN-ODT) 4 MG disintegrating tablet  Every 6 hours PRN        06/23/21 0244  Note:  This document was prepared using Dragon voice recognition software and may include unintentional dictation errors.   Nabria Nevin, Delice Bison, DO 06/23/21 [phone]

## 2021-06-23 NOTE — Discharge Instructions (Addendum)
I recommend that you increase your water and fiber intake. If you are not able to eat foods high in fiber, you may use Benefiber or Metamucil over-the-counter. I also recommend you use MiraLAX 1-2 times a day and Colace 100 mg twice a day to help with bowel movements if you are constipated. These medications are over the counter.  You may use other over-the-counter medications such as Dulcolax, Fleet enemas, magnesium citrate as needed for constipation. Please note that some of these medications may cause you to have abdominal cramping which is normal. If you develop severe abdominal pain, fever (temperature of 100.4 or higher), persistent vomiting, distention of your abdomen, unable to have a bowel movement for 5 days or are not passing gas, please return to the hospital.  Your labs, urine and CT scan today were reassuring.  I recommend close follow-up with gastroenterology if symptoms continue.

## 2021-07-15 DIAGNOSIS — R197 Diarrhea, unspecified: Secondary | ICD-10-CM | POA: Diagnosis not present

## 2021-07-15 DIAGNOSIS — R1011 Right upper quadrant pain: Secondary | ICD-10-CM | POA: Diagnosis not present

## 2021-07-21 ENCOUNTER — Other Ambulatory Visit: Payer: Self-pay

## 2021-07-21 ENCOUNTER — Ambulatory Visit (INDEPENDENT_AMBULATORY_CARE_PROVIDER_SITE_OTHER): Payer: 59 | Admitting: Gastroenterology

## 2021-07-21 ENCOUNTER — Encounter: Payer: Self-pay | Admitting: Gastroenterology

## 2021-07-21 VITALS — BP 132/80 | HR 70 | Temp 99.1°F | Ht 59.0 in | Wt 98.6 lb

## 2021-07-21 DIAGNOSIS — R1084 Generalized abdominal pain: Secondary | ICD-10-CM

## 2021-07-21 DIAGNOSIS — R194 Change in bowel habit: Secondary | ICD-10-CM | POA: Diagnosis not present

## 2021-07-21 MED ORDER — NA SULFATE-K SULFATE-MG SULF 17.5-3.13-1.6 GM/177ML PO SOLN: 354.0000 mL | Freq: Once | ORAL | 0 refills | Status: AC

## 2021-07-21 NOTE — Patient Instructions (Signed)
Please stop taking Amitiza.  Please take Miralax 17 grams two times a day as needed for constipation.  High-Fiber Eating Plan Fiber, also called dietary fiber, is a type of carbohydrate. It is found foods such as fruits, vegetables, whole grains, and beans. A high-fiber diet can have many health benefits. Your health care provider may recommend a high-fiber diet to help: Prevent constipation. Fiber can make your bowel movements more regular. Lower your cholesterol. Relieve the following conditions: Inflammation of veins in the anus (hemorrhoids). Inflammation of specific areas of the digestive tract (uncomplicated diverticulosis). A problem of the large intestine, also called the colon, that sometimes causes pain and diarrhea (irritable bowel syndrome, or IBS). Prevent overeating as part of a weight-loss plan. Prevent heart disease, type 2 diabetes, and certain cancers. What are tips for following this plan? Reading food labels  Check the nutrition facts label on food products for the amount of dietary fiber. Choose foods that have 5 grams of fiber or more per serving. The goals for recommended daily fiber intake include: Men (age 84 or younger): 34-38 g. Men (over age 82): 28-34 g. Women (age 43 or younger): 25-28 g. Women (over age 2): 22-25 g. Your daily fiber goal is _____________ g. Shopping Choose whole fruits and vegetables instead of processed forms, such as apple juice or applesauce. Choose a wide variety of high-fiber foods such as avocados, lentils, oats, and kidney beans. Read the nutrition facts label of the foods you choose. Be aware of foods with added fiber. These foods often have high sugar and sodium amounts per serving. Cooking Use whole-grain flour for baking and cooking. Cook with brown rice instead of white rice. Meal planning Start the day with a breakfast that is high in fiber, such as a cereal that contains 5 g of fiber or more per serving. Eat breads and  cereals that are made with whole-grain flour instead of refined flour or white flour. Eat brown rice, bulgur wheat, or millet instead of white rice. Use beans in place of meat in soups, salads, and pasta dishes. Be sure that half of the grains you eat each day are whole grains. General information You can get the recommended daily intake of dietary fiber by: Eating a variety of fruits, vegetables, grains, nuts, and beans. Taking a fiber supplement if you are not able to take in enough fiber in your diet. It is better to get fiber through food than from a supplement. Gradually increase how much fiber you consume. If you increase your intake of dietary fiber too quickly, you may have bloating, cramping, or gas. Drink plenty of water to help you digest fiber. Choose high-fiber snacks, such as berries, raw vegetables, nuts, and popcorn. What foods should I eat? Fruits Berries. Pears. Apples. Oranges. Avocado. Prunes and raisins. Dried figs. Vegetables Sweet potatoes. Spinach. Kale. Artichokes. Cabbage. Broccoli. Cauliflower. Green peas. Carrots. Squash. Grains Whole-grain breads. Multigrain cereal. Oats and oatmeal. Brown rice. Barley. Bulgur wheat. [place]. Quinoa. Bran muffins. Popcorn. Rye wafer crackers. Meats and other proteins Navy beans, kidney beans, and pinto beans. Soybeans. Split peas. Lentils. Nuts and seeds. Dairy Fiber-fortified yogurt. Beverages Fiber-fortified soy milk. Fiber-fortified orange juice. Other foods Fiber bars. The items listed above may not be a complete list of recommended foods and beverages. Contact a dietitian for more information. What foods should I avoid? Fruits Fruit juice. Cooked, strained fruit. Vegetables Fried potatoes. Canned vegetables. Well-cooked vegetables. Grains White bread. Pasta made with refined flour. White rice. Meats and other  proteins Fatty cuts of meat. Fried chicken or fried fish. Dairy Milk. Yogurt. Cream cheese. Sour  cream. Fats and oils Butters. Beverages Soft drinks. Other foods Cakes and pastries. The items listed above may not be a complete list of foods and beverages to avoid. Talk with your dietitian about what choices are best for you. Summary Fiber is a type of carbohydrate. It is found in foods such as fruits, vegetables, whole grains, and beans. A high-fiber diet has many benefits. It can help to prevent constipation, lower blood cholesterol, aid weight loss, and reduce your risk of heart disease, diabetes, and certain cancers. Increase your intake of fiber gradually. Increasing fiber too quickly may cause cramping, bloating, and gas. Drink plenty of water while you increase the amount of fiber you consume. The best sources of fiber include whole fruits and vegetables, whole grains, nuts, seeds, and beans. This information is not intended to replace advice given to you by your health care provider. Make sure you discuss any questions you have with your health care provider. Document Revised: 05/24/2019 Document Reviewed: 05/24/2019 Elsevier Patient Education  [place].

## 2021-07-21 NOTE — Progress Notes (Signed)
[name] MD, MRCP(U.K) [address]  [place]  [place], [place] [address]  Main: [phone]  Fax: [phone]   Primary Care Physician: Gladstone Lighter, MD  Primary Gastroenterologist:  Dr. [name]   Chief Complaint  Patient presents with   Abdominal Pain    HPI: Jordan Ortega is a 66 y.o. female    Previously seen me in October 2020 for unintentional weight loss.  History of IBS syndrome.  Treated for constipation and overflow diarrhea in the past EGD and colonoscopy in 2018 for no abnormalities.  Normal mammogram in 2020 CT scan of the abdomen in October 2020 showed gastritis stool seen throughout the colon commenced on Linzess 72 mcg a day 12/05/2018 EGD showed features of gastritis 1119 2020 capsule study of small bowel showed normal study ER visit in May for abdominal pain CT scan of the abdomen on 06/04/2021 showed large amount of fecal matter throughout the colon suggesting constipation repeat CT scan on 06/22/2021 showed no abnormalities but old T12 compression fracture 06/22/2021 hemoglobin 13.3 g, CMP normal except low glucose of 69   She has noticed a recent change in her bowel habits with diarrhea alternating constipation.  Stop taking Linzess as it caused severe diarrhea even at 72 mcg/day recently started on Amitiza 24 mcg/day also causing severe diarrhea.  She has taken Imodium which causes her to get constipated.  Severe abdominal discomfort continues to smoke she wants an EGD and colonoscopy to make sure everything is good inside lots of gas and bloating Current Outpatient Medications  Medication Sig Dispense Refill   AIMOVIG 140 MG/ML SOAJ Inject into the skin.     albuterol (PROVENTIL HFA;VENTOLIN HFA) 108 (90 Base) MCG/ACT inhaler Inhale 2 puffs into the lungs every 6 (six) hours as needed for wheezing or shortness of breath. 1 Inhaler 2   ALPRAZolam (XANAX) 0.5 MG tablet Take 1 tablet (0.5 mg total) by mouth 3 (three) times daily as needed for  anxiety. 90 tablet 0   baclofen (LIORESAL) 10 MG tablet Take 1 tablet (10 mg total) by mouth 3 (three) times daily. 270 tablet 3   baclofen (LIORESAL) 10 MG tablet Take 1 tablet by mouth 3 (three) times daily.     budesonide-formoterol (SYMBICORT) 160-4.5 MCG/ACT inhaler Inhale 1 puff into the lungs 2 (two) times daily. 1 Inhaler 0   buPROPion ER (WELLBUTRIN SR) 100 MG 12 hr tablet Take 100 mg by mouth 2 (two) times daily.     Cholecalciferol 25 MCG (1000 UT) tablet Take by mouth.     cyanocobalamin ([employer] VITAMIN B12) 1000 MCG tablet Take 1 tablet by mouth daily.     cyclobenzaprine (FLEXERIL) 5 MG tablet Take 1 tablet (5 mg total) by mouth 3 (three) times daily as needed for muscle spasms. 30 tablet 5   dicyclomine (BENTYL) 20 MG tablet Take 1 tablet (20 mg total) by mouth every 8 (eight) hours as needed for spasms (Abdominal cramping). 15 tablet 0   dicyclomine (BENTYL) 20 MG tablet Take 1 tablet by mouth every 6 (six) hours.     diphenhydrAMINE (BENADRYL) 50 MG capsule 1 capsule 1 hour prior to ESI     DULoxetine (CYMBALTA) 60 MG capsule Take 1 capsule by mouth daily.     Ferrous Fumarate-Vitamin C ER 65-25 MG TBCR Take 1 tablet by mouth daily.     gabapentin (NEURONTIN) 400 MG capsule Take 1 capsule by mouth at bedtime.     HYDROcodone-acetaminophen (NORCO/VICODIN) 5-325 MG tablet  Take 1 tablet by mouth every 4 (four) hours as needed for moderate pain or severe pain. 90 tablet 0   ketorolac (TORADOL) 10 MG tablet Take 10 mg by mouth every 6 (six) hours as needed.     loratadine (CLARITIN) 10 MG tablet Take 10 mg by mouth 2 (two) times daily.     loratadine (CLARITIN) 10 MG tablet Take by mouth.     lubiprostone (AMITIZA) 24 MCG capsule Take 1 capsule by mouth 1 day or 1 dose.     magnesium gluconate (MAGONATE) 500 MG tablet Take by mouth.     meloxicam (MOBIC) 15 MG tablet Take 1 tablet by mouth daily.     mirtazapine (REMERON) 30 MG tablet Take 30 mg by mouth at bedtime.     Multiple  Vitamin (MULTIVITAMIN) tablet Take 1 tablet by mouth daily.     ondansetron (ZOFRAN-ODT) 4 MG disintegrating tablet Take 1 tablet (4 mg total) by mouth every 6 (six) hours as needed for nausea or vomiting. 20 tablet 0   pantoprazole (PROTONIX) 40 MG tablet Take 1 tablet (40 mg total) by mouth daily. 30 tablet 1   pantoprazole (PROTONIX) 40 MG tablet Take 1 tablet by mouth daily.     Probiotic Product (PROBIOTIC-10) CHEW Chew 2 tablets by mouth daily.     promethazine (PHENERGAN) 25 MG tablet Take 1 tablet (25 mg total) by mouth every 8 (eight) hours as needed for nausea or vomiting. 30 tablet 0   simvastatin (ZOCOR) 20 MG tablet TAKE ONE TABLET BY MOUTH AT BEDTIME 90 tablet 3   No current facility-administered medications for this visit.    Allergies as of 07/21/2021 - Review Complete 07/21/2021  Allergen Reaction Noted   Diazepam Other (See Comments) 11/20/2009   Ciprofloxacin Other (See Comments) 06/21/2013   Codeine Hives and Itching 11/20/2009   Metoprolol  07/10/2014   Morphine Hives 11/20/2009   Niacin and related Other (See Comments) 11/28/2011   Topiramate Other (See Comments) 11/01/2017   Toradol [ketorolac tromethamine] Itching 07/10/2014   Doxycycline Diarrhea 03/21/2018    ROS:  General: Negative for anorexia, weight loss, fever, chills, fatigue, weakness. ENT: Negative for hoarseness, difficulty swallowing , nasal congestion. CV: Negative for chest pain, angina, palpitations, dyspnea on exertion, peripheral edema.  Respiratory: Negative for dyspnea at rest, dyspnea on exertion, cough, sputum, wheezing.  GI: See history of present illness. GU:  Negative for dysuria, hematuria, urinary incontinence, urinary frequency, nocturnal urination.  Endo: Negative for unusual weight change.    Physical Examination:   BP 132/80   Pulse 70   Temp 99.1 F (37.3 C) (Oral)   Ht '4\' 11"'$  (1.499 m)   Wt 98 lb 9.6 oz (44.7 kg)   BMI 19.91 kg/m   General: Well-nourished,  well-developed in no acute distress.  Eyes: No icterus. Conjunctivae pink. Mouth: Oropharyngeal mucosa moist and pink , no lesions erythema or exudate. Extremities: No lower extremity edema. No clubbing or deformities. Neuro: Alert and oriented x 3.  Grossly intact. Skin: Warm and dry, no jaundice.   Psych: Alert and cooperative, normal mood and affect.   Imaging Studies: CT ABDOMEN PELVIS W CONTRAST  Result Date: 06/22/2021 CLINICAL DATA:  Abdominal pain. EXAM: CT ABDOMEN AND PELVIS WITH CONTRAST TECHNIQUE: Multidetector CT imaging of the abdomen and pelvis was performed using the standard protocol following bolus administration of intravenous contrast. RADIATION DOSE REDUCTION: This exam was performed according to the departmental dose-optimization program which includes automated exposure control, adjustment of the mA  and/or kV according to patient size and/or use of iterative reconstruction technique. CONTRAST:  53m OMNIPAQUE IOHEXOL 300 MG/ML  SOLN COMPARISON:  CT abdomen pelvis dated 06/04/2021. FINDINGS: Lower chest: The visualized lung bases are clear. No intra-abdominal free air or free fluid. Hepatobiliary: The liver is unremarkable. Mild intrahepatic biliary dilatation, likely post cholecystectomy. Pancreas: Unremarkable. No pancreatic ductal dilatation or surrounding inflammatory changes. Spleen: Normal in size without focal abnormality. Adrenals/Urinary Tract: The adrenal glands unremarkable. The kidneys, visualized ureters, and urinary bladder appear unremarkable. Stomach/Bowel: There is no bowel obstruction or active inflammation. Appendectomy. Vascular/Lymphatic: Mild aortoiliac atherosclerotic disease. The IVC is unremarkable. No portal venous gas. There is no adenopathy. Reproductive: Hysterectomy.  No adnexal masses. Other: None Musculoskeletal: Osteopenia with degenerative changes of the spine. No acute osseous pathology. Old T12 compression fracture. IMPRESSION: 1. No acute  intra-abdominal or pelvic pathology. 2. Aortic Atherosclerosis (ICD10-I70.0). Electronically Signed   By: AAnner CreteM.D.   On: 06/22/2021 22:14    Assessment and Plan:   DIllyanna Petillois a 66y.o. y/o female here to see me today for gas bloating constipation alternating with diarrhea worsening of symptoms.  CT scan shows features of constipation no other abnormalities with recent change in bowel habits and abdominal pain we will proceed with EGD and colonoscopy next week advised to stop the Amitiza as it causing her to have severe diarrhea possibly an overflow diarrhea but I will start her on MiraLAX twice a day she says lactulose caused significant abdominal discomfort.  I have discussed alternative options, risks & benefits,  which include, but are not limited to, bleeding, infection, perforation,respiratory complication & drug reaction.  The patient agrees with this plan & written consent will be obtained.  '    Dr KJonathon Bellows MD,MRCP ([facility] Follow up in 3 months

## 2021-07-22 DIAGNOSIS — K5900 Constipation, unspecified: Secondary | ICD-10-CM | POA: Diagnosis not present

## 2021-07-22 DIAGNOSIS — G43019 Migraine without aura, intractable, without status migrainosus: Secondary | ICD-10-CM | POA: Diagnosis not present

## 2021-07-22 DIAGNOSIS — G44201 Tension-type headache, unspecified, intractable: Secondary | ICD-10-CM | POA: Diagnosis not present

## 2021-07-22 DIAGNOSIS — M5412 Radiculopathy, cervical region: Secondary | ICD-10-CM | POA: Diagnosis not present

## 2021-07-22 DIAGNOSIS — R1011 Right upper quadrant pain: Secondary | ICD-10-CM | POA: Diagnosis not present

## 2021-07-23 ENCOUNTER — Encounter: Payer: Self-pay | Admitting: Gastroenterology

## 2021-07-28 ENCOUNTER — Encounter: Payer: Self-pay | Admitting: Gastroenterology

## 2021-07-29 ENCOUNTER — Ambulatory Visit: Payer: 59 | Admitting: Certified Registered Nurse Anesthetist

## 2021-07-29 ENCOUNTER — Ambulatory Visit
Admission: RE | Admit: 2021-07-29 | Discharge: 2021-07-29 | Disposition: A | Discharge status: Home / Self Care | Payer: 59 | Attending: Gastroenterology | Admitting: Gastroenterology

## 2021-07-29 ENCOUNTER — Encounter: Payer: Self-pay | Admitting: Gastroenterology

## 2021-07-29 ENCOUNTER — Encounter
Admission: RE | Disposition: A | Discharge status: Home / Self Care | Payer: Self-pay | Source: Home / Self Care | Attending: Gastroenterology

## 2021-07-29 DIAGNOSIS — K2270 Barrett's esophagus without dysplasia: Secondary | ICD-10-CM | POA: Insufficient documentation

## 2021-07-29 DIAGNOSIS — J449 Chronic obstructive pulmonary disease, unspecified: Secondary | ICD-10-CM | POA: Insufficient documentation

## 2021-07-29 DIAGNOSIS — G43909 Migraine, unspecified, not intractable, without status migrainosus: Secondary | ICD-10-CM | POA: Diagnosis not present

## 2021-07-29 DIAGNOSIS — F418 Other specified anxiety disorders: Secondary | ICD-10-CM | POA: Diagnosis not present

## 2021-07-29 DIAGNOSIS — F319 Bipolar disorder, unspecified: Secondary | ICD-10-CM | POA: Insufficient documentation

## 2021-07-29 DIAGNOSIS — R1084 Generalized abdominal pain: Secondary | ICD-10-CM | POA: Diagnosis not present

## 2021-07-29 DIAGNOSIS — E781 Pure hyperglyceridemia: Secondary | ICD-10-CM | POA: Diagnosis not present

## 2021-07-29 DIAGNOSIS — F17210 Nicotine dependence, cigarettes, uncomplicated: Secondary | ICD-10-CM | POA: Insufficient documentation

## 2021-07-29 DIAGNOSIS — K219 Gastro-esophageal reflux disease without esophagitis: Secondary | ICD-10-CM | POA: Insufficient documentation

## 2021-07-29 DIAGNOSIS — D126 Benign neoplasm of colon, unspecified: Secondary | ICD-10-CM

## 2021-07-29 DIAGNOSIS — K259 Gastric ulcer, unspecified as acute or chronic, without hemorrhage or perforation: Secondary | ICD-10-CM | POA: Diagnosis not present

## 2021-07-29 DIAGNOSIS — R69 Illness, unspecified: Secondary | ICD-10-CM | POA: Diagnosis not present

## 2021-07-29 DIAGNOSIS — M1990 Unspecified osteoarthritis, unspecified site: Secondary | ICD-10-CM | POA: Diagnosis not present

## 2021-07-29 DIAGNOSIS — R109 Unspecified abdominal pain: Secondary | ICD-10-CM | POA: Diagnosis not present

## 2021-07-29 DIAGNOSIS — K635 Polyp of colon: Secondary | ICD-10-CM | POA: Insufficient documentation

## 2021-07-29 DIAGNOSIS — G510 Bell's palsy: Secondary | ICD-10-CM | POA: Insufficient documentation

## 2021-07-29 DIAGNOSIS — K319 Disease of stomach and duodenum, unspecified: Secondary | ICD-10-CM | POA: Diagnosis not present

## 2021-07-29 DIAGNOSIS — R194 Change in bowel habit: Secondary | ICD-10-CM

## 2021-07-29 HISTORY — PX: ESOPHAGOGASTRODUODENOSCOPY: SHX5428

## 2021-07-29 HISTORY — PX: COLONOSCOPY WITH PROPOFOL: SHX5780

## 2021-07-29 SURGERY — COLONOSCOPY WITH PROPOFOL
Anesthesia: General

## 2021-07-29 MED ORDER — SODIUM CHLORIDE 0.9 % IV SOLN: INTRAVENOUS | Status: DC

## 2021-07-29 MED ORDER — PROPOFOL 500 MG/50ML IV EMUL
INTRAVENOUS | Status: DC | PRN
  Administered 2021-07-29: 150 ug/kg/min via INTRAVENOUS

## 2021-07-29 MED ORDER — PROPOFOL 10 MG/ML IV BOLUS
INTRAVENOUS | Status: DC | PRN
  Administered 2021-07-29: 40 mg via INTRAVENOUS
  Administered 2021-07-29 (×3): 20 mg via INTRAVENOUS
  Administered 2021-07-29: 60 mg via INTRAVENOUS

## 2021-07-29 MED ORDER — GLYCOPYRROLATE 0.2 MG/ML IJ SOLN
INTRAMUSCULAR | Status: DC | PRN
  Administered 2021-07-29: .2 mg via INTRAVENOUS

## 2021-07-29 MED ORDER — LIDOCAINE HCL (CARDIAC) PF 100 MG/5ML IV SOSY
PREFILLED_SYRINGE | INTRAVENOUS | Status: DC | PRN
  Administered 2021-07-29: 50 mg via INTRAVENOUS

## 2021-07-29 NOTE — Transfer of Care (Signed)
Immediate Anesthesia Transfer of Care Note  [name]  Procedure(s) Performed: COLONOSCOPY WITH PROPOFOL ESOPHAGOGASTRODUODENOSCOPY (EGD)  Patient Location: Endoscopy Unit  Anesthesia Type:General  Level of Consciousness: awake, alert  and oriented  Airway & Oxygen Therapy: Patient Spontanous Breathing  Post-op Assessment: Report given to RN and Post -op Vital signs reviewed and stable  Post vital signs: Reviewed and stable  Last Vitals:  Vitals Value Taken Time  BP 103/59   Temp 36   Pulse 72   Resp 20   SpO2 99     Last Pain:  Vitals:   07/29/21 1234  TempSrc: Temporal  PainSc: 0-No pain         Complications: No notable events documented.

## 2021-07-29 NOTE — Anesthesia Procedure Notes (Signed)
Date/Time: 07/29/2021 1:24 PM  Performed by: Lily Peer, Haiden Rawlinson, CRNAPre-anesthesia Checklist: Patient identified, Emergency Drugs available, Suction available, Patient being monitored and Timeout performed Patient Re-evaluated:Patient Re-evaluated prior to induction Oxygen Delivery Method: Simple face mask Induction Type: IV induction

## 2021-07-29 NOTE — Anesthesia Preprocedure Evaluation (Signed)
Anesthesia Evaluation  Patient identified by MRN, date of birth, ID band Patient awake    Reviewed: Allergy & Precautions, NPO status , Patient's Chart, lab work & pertinent test results, reviewed documented beta blocker date and time   History of Anesthesia Complications (+) PROLONGED EMERGENCE and history of anesthetic complications  Airway Mallampati: I  TM Distance: <3 FB     Dental  (+) Caps, Missing, Dental Advidsory Given   Pulmonary neg shortness of breath, COPD,  COPD inhaler, neg recent URI, Current Smoker and Patient abstained from smoking.,    Pulmonary exam normal        Cardiovascular Exercise Tolerance: Good (-) hypertension(-) angina(-) Past MI and (-) Cardiac Stents Normal cardiovascular exam(-) dysrhythmias + Valvular Problems/Murmurs      Neuro/Psych  Headaches, neg Seizures PSYCHIATRIC DISORDERS Anxiety Depression Bipolar Disorder  Neuromuscular disease    GI/Hepatic Neg liver ROS, GERD  Medicated,Barrett's esophagus   Endo/Other  negative endocrine ROS  Renal/GU negative Renal ROS  negative genitourinary   Musculoskeletal  (+) Arthritis , Osteoarthritis,    Abdominal Normal abdominal exam  (+)   Peds negative pediatric ROS (+)  Hematology negative hematology ROS (+)   Anesthesia Other Findings Past Medical History: No date: Adnexal mass No date: Anxiety No date: Barrett's esophagus No date: Bell palsy     Comment:  left foot also ,  No date: Bronchitis No date: Complication of anesthesia     Comment:  difficulty waking up No date: Degenerative disc disease, cervical No date: Depression No date: Environmental and seasonal allergies No date: GERD (gastroesophageal reflux disease) No date: High triglycerides No date: Migraines No date: Migraines No date: Rapid heart rate  Reproductive/Obstetrics                             Anesthesia Physical  Anesthesia  Plan  ASA: II  Anesthesia Plan: General   Post-op Pain Management:    Induction: Intravenous  PONV Risk Score and Plan: 3 and Propofol infusion and TIVA  Airway Management Planned: Natural Airway and Nasal Cannula  Additional Equipment:   Intra-op Plan:   Post-operative Plan:   Informed Consent: I have reviewed the patients History and Physical, chart, labs and discussed the procedure including the risks, benefits and alternatives for the proposed anesthesia with the patient or authorized representative who has indicated his/her understanding and acceptance.     Dental advisory given  Plan Discussed with: CRNA and Surgeon  Anesthesia Plan Comments:         Anesthesia Quick Evaluation

## 2021-07-29 NOTE — H&P (Signed)
Jordan Bellows, MD [address], [place], [place], [place], [address] [address], [place], [place], [place], [address] Phone: [phone]  Fax: [phone]  Primary Care Physician:  Jordan Lighter, MD   Pre-Procedure History & Physical: HPI:  Jordan Ortega is a 66 y.o. female is here for an endoscopy and colonoscopy    Past Medical History:  Diagnosis Date   Actinic keratosis    Adnexal mass    Anxiety    Barrett's esophagus    Bell palsy    left foot also ,    Bronchitis    Complication of anesthesia    difficulty waking up   COPD (chronic obstructive pulmonary disease) (HCC)    Degenerative disc disease, cervical    Depression    Dysplastic nevus 06/08/2019   Right lower abdomen, moderate atypia, limited margins free.   Dysplastic nevus 12/06/2018   right lower abdomen/moderate, limited margins free.   Environmental and seasonal allergies    GERD (gastroesophageal reflux disease)    Heart murmur    Hx   High triglycerides    Migraines    Migraines    Rapid heart rate     Past Surgical History:  Procedure Laterality Date   ABDOMINAL HYSTERECTOMY     APPENDECTOMY     CARDIAC CATHETERIZATION     [facility] pt doesn't have any stents or recall years ago 20+ yrs ago   CHOLECYSTECTOMY     COLONOSCOPY WITH PROPOFOL N/A 07/16/2016   Procedure: COLONOSCOPY WITH PROPOFOL;  Surgeon: Jordan Bellows, MD;  Location: [facility] ENDOSCOPY;  Service: Endoscopy;  Laterality: N/A;   COLONOSCOPY WITH PROPOFOL N/A 10/20/2018   Procedure: COLONOSCOPY WITH PROPOFOL;  Surgeon: Jordan Bellows, MD;  Location: [facility] ENDOSCOPY;  Service: Gastroenterology;  Laterality: N/A;   DIAGNOSTIC LAPAROSCOPY     ESOPHAGOGASTRODUODENOSCOPY (EGD) WITH PROPOFOL N/A 07/16/2016   Procedure: ESOPHAGOGASTRODUODENOSCOPY (EGD) WITH PROPOFOL;  Surgeon: Jordan Bellows, MD;  Location: [facility] ENDOSCOPY;  Service: Endoscopy;  Laterality: N/A;   ESOPHAGOGASTRODUODENOSCOPY (EGD) WITH PROPOFOL N/A 12/05/2018   Procedure:  ESOPHAGOGASTRODUODENOSCOPY (EGD) WITH PROPOFOL;  Surgeon: Jordan Bellows, MD;  Location: [facility] ENDOSCOPY;  Service: Gastroenterology;  Laterality: N/A;   GALLBLADDER SURGERY  1980   GIVENS CAPSULE STUDY N/A 12/21/2018   Procedure: GIVENS CAPSULE STUDY;  Surgeon: Jordan Bellows, MD;  Location: [facility] ENDOSCOPY;  Service: Gastroenterology;  Laterality: N/A;   LAPAROSCOPIC SALPINGO OOPHERECTOMY Bilateral 08/27/2016   Procedure: LAPAROSCOPIC SALPINGO OOPHORECTOMY;  Surgeon: Gae Dry, MD;  Location: [facility] ORS;  Service: Gynecology;  Laterality: Bilateral;   TUBAL LIGATION  1983    Prior to Admission medications   Medication Sig Start Date End Date Taking? Authorizing Provider  AIMOVIG 140 MG/ML SOAJ Inject into the skin. 04/01/21   [provider]  albuterol (PROVENTIL HFA;VENTOLIN HFA) 108 (90 Base) MCG/ACT inhaler Inhale 2 puffs into the lungs every 6 (six) hours as needed for wheezing or shortness of breath. 11/01/17   Chrismon, Vickki Muff, PA-C  ALPRAZolam (XANAX) 0.5 MG tablet Take 1 tablet (0.5 mg total) by mouth 3 (three) times daily as needed for anxiety. 11/19/16   Chrismon, Vickki Muff, PA-C  baclofen (LIORESAL) 10 MG tablet Take 1 tablet (10 mg total) by mouth 3 (three) times daily. 04/21/20   Mar Daring, PA-C  baclofen (LIORESAL) 10 MG tablet Take 1 tablet by mouth 3 (three) times daily. 07/06/21   [provider]  budesonide-formoterol (SYMBICORT) 160-4.5 MCG/ACT inhaler Inhale 1 puff into the lungs 2 (two) times  daily. 03/21/17   Chrismon, Vickki Muff, PA-C  buPROPion ER [facility] SR) 100 MG 12 hr tablet Take 100 mg by mouth 2 (two) times daily. 03/25/21   [provider]  Cholecalciferol 25 MCG (1000 UT) tablet Take by mouth.    [provider]  cyanocobalamin ([employer] VITAMIN B12) 1000 MCG tablet Take 1 tablet by mouth daily.    [provider]  cyclobenzaprine (FLEXERIL) 5 MG tablet Take 1 tablet (5 mg total) by mouth 3 (three) times daily as needed  for muscle spasms. 04/21/20   Mar Daring, PA-C  dicyclomine (BENTYL) 20 MG tablet Take 1 tablet (20 mg total) by mouth every 8 (eight) hours as needed for spasms (Abdominal cramping). 06/23/21   Ward, Delice Bison, DO  dicyclomine (BENTYL) 20 MG tablet Take 1 tablet by mouth every 6 (six) hours. 06/04/21 06/04/22  [provider]  diphenhydrAMINE (BENADRYL) 50 MG capsule 1 capsule 1 hour prior to [facility] 05/01/21   [provider]  DULoxetine (CYMBALTA) 60 MG capsule Take 1 capsule by mouth daily. 04/03/21 04/03/22  [provider]  Ferrous Fumarate-Vitamin C ER 65-25 MG TBCR Take 1 tablet by mouth daily.    [provider]  gabapentin (NEURONTIN) 400 MG capsule Take 1 capsule by mouth at bedtime. 04/03/21 04/03/22  [provider]  HYDROcodone-acetaminophen (NORCO/VICODIN) 5-325 MG tablet Take 1 tablet by mouth every 4 (four) hours as needed for moderate pain or severe pain. 04/21/20   Mar Daring, PA-C  ketorolac (TORADOL) 10 MG tablet Take 10 mg by mouth every 6 (six) hours as needed. 06/15/21   [provider]  loratadine (CLARITIN) 10 MG tablet Take 10 mg by mouth 2 (two) times daily.    [provider]  loratadine (CLARITIN) 10 MG tablet Take by mouth.    [provider]  lubiprostone (AMITIZA) 24 MCG capsule Take 1 capsule by mouth 1 day or 1 dose. 06/22/21   [provider]  magnesium gluconate (MAGONATE) 500 MG tablet Take by mouth.    [provider]  meloxicam (MOBIC) 15 MG tablet Take 1 tablet by mouth daily. 03/25/21   [provider]  mirtazapine (REMERON) 30 MG tablet Take 30 mg by mouth at bedtime. 04/30/21   [provider]  Multiple Vitamin (MULTIVITAMIN) tablet Take 1 tablet by mouth daily.    [provider]  ondansetron (ZOFRAN-ODT) 4 MG disintegrating tablet Take 1 tablet (4 mg total) by mouth every 6 (six) hours as needed for nausea or vomiting. 06/23/21   Ward, Delice Bison, DO  pantoprazole (PROTONIX) 40 MG tablet Take 1 tablet (40 mg total) by mouth daily. 06/23/21 06/23/22  Ward, Delice Bison, DO  pantoprazole (PROTONIX) 40 MG tablet Take 1 tablet by mouth daily. 04/03/21   [provider]  Probiotic Product (PROBIOTIC-10) CHEW Chew 2 tablets by mouth daily.    [provider]  promethazine (PHENERGAN) 25 MG tablet Take 1 tablet (25 mg total) by mouth every 8 (eight) hours as needed for nausea or vomiting. 10/17/18   Mar Daring, PA-C  simvastatin (ZOCOR) 20 MG tablet TAKE ONE TABLET BY MOUTH AT BEDTIME 02/08/20   Mar Daring, PA-C    Allergies as of 07/22/2021 - Review Complete 07/21/2021  Allergen Reaction Noted   Diazepam Other (See Comments) 11/20/2009   Ciprofloxacin Other (See Comments) 06/21/2013   Codeine Hives and Itching 11/20/2009   Metoprolol  07/10/2014   Morphine Hives 11/20/2009   Niacin and  related Other (See Comments) 11/28/2011   Topiramate Other (See Comments) 11/01/2017   Toradol [ketorolac tromethamine] Itching 07/10/2014   Doxycycline Diarrhea 03/21/2018    Family History  Problem Relation Age of Onset   Breast cancer Mother 61   Skin cancer Mother    Hypothyroidism Mother    Parkinson's disease Mother    Osteoporosis Mother    Colon cancer Father    Lung cancer Father    Heart disease Father    Migraines Sister    Breast cancer Maternal Aunt        post men.   Diabetes Paternal Uncle    Diabetes Paternal Grandmother    Hypertension Paternal Grandmother    Stroke Paternal Grandmother    Dementia Paternal Grandfather    Breast cancer Cousin 57       maternal    Multiple myeloma Cousin     Social History   Socioeconomic History   Marital status: Married    Spouse name: Louie Casa   Number of children: 3   Years of education: Not on file   Highest education level: Not on file  Occupational History   Not on file  Tobacco Use   Smoking status: Every Day    Packs/day: 1.00    Years: 46.00     Total pack years: 46.00    Types: Cigarettes   Smokeless tobacco: Never   Tobacco comments:    .5ppd currently    04/06/2021- Patient states she has cut back to 1 pack a day   Vaping Use   Vaping Use: Never used  Substance and Sexual Activity   Alcohol use: Not Currently    Alcohol/week: 0.0 standard drinks of alcohol   Drug use: No   Sexual activity: Not on file  Other Topics Concern   Not on file  Social History Narrative   Right Handed    Lives in a two story home. Lives with husband   6 grand children    Social Determinants of Health   Financial Resource Strain: Low Risk  (01/06/2017)   Overall Financial Resource Strain (CARDIA)    Difficulty of Paying Living Expenses: Not hard at all  Food Insecurity: No Food Insecurity (01/06/2017)   Hunger Vital Sign    Worried About Running Out of Food in the Last Year: Never true    Ran Out of Food in the Last Year: Never true  Transportation Needs: No Transportation Needs (01/06/2017)   PRAPARE - Hydrologist (Medical): No    Lack of Transportation (Non-Medical): No  Physical Activity: Inactive (01/06/2017)   Exercise Vital Sign    Days of Exercise per Week: 0 days    Minutes of Exercise per Session: 0 min  Stress: Not on file  Social Connections: Unknown (01/06/2017)   Social Connection and Isolation Panel [NHANES]    Frequency of Communication with Friends and Family: Patient refused    Frequency of Social Gatherings with Friends and Family: Patient refused    Attends Religious Services: Patient refused    Active Member of Clubs or Organizations: Patient refused    Attends Archivist Meetings: Patient refused    Marital Status: Patient refused  Intimate Partner Violence: Unknown (01/06/2017)   Humiliation, Afraid, Rape, and Kick questionnaire    Fear of Current or Ex-Partner: Patient refused    Emotionally Abused: Patient refused    Physically Abused: Patient refused    Sexually Abused:  Patient refused    Review  of Systems: See HPI, otherwise negative ROS  Physical Exam: BP 130/83   Pulse 76   Temp (!) 97.3 F (36.3 C) (Temporal)   Resp 14   Ht 5' (1.524 m)   Wt 42.6 kg   SpO2 100%   BMI 18.36 kg/m  General:   Alert,  pleasant and cooperative in NAD Head:  Normocephalic and atraumatic. Neck:  Supple; no masses or thyromegaly. Lungs:  Clear throughout to auscultation, normal respiratory effort.    Heart:  +S1, +S2, Regular rate and rhythm, No edema. Abdomen:  Soft, nontender and nondistended. Normal bowel sounds, without guarding, and without rebound.   Neurologic:  Alert and  oriented x4;  grossly normal neurologically.  Impression/Plan: Jordan Ortega is here for an endoscopy and colonoscopy  to be performed for  evaluation of abdominal pain     Risks, benefits, limitations, and alternatives regarding endoscopy have been reviewed with the patient.  Questions have been answered.  All parties agreeable.   Jordan Bellows, MD  07/29/2021, 1:11 PM

## 2021-07-29 NOTE — Op Note (Signed)
[facility] Gastroenterology Patient Name: Jordan Ortega Procedure Date: 07/29/2021 1:07 PM MRN: [id number] Account #: [id number] Date of Birth: 1955-02-14 Admit Type: Outpatient Age: 66 Room: [facility] ENDO ROOM 3 Gender: Female Note Status: Finalized Instrument Name: Altamese Cabal Endoscope [id number] Procedure:             Upper GI endoscopy Indications:           Abdominal pain Providers:             [name] MD, MD Medicines:             Monitored Anesthesia Care Complications:         No immediate complications. Procedure:             Pre-Anesthesia Assessment:                        - Prior to the procedure, a History and Physical was                         performed, and patient medications, allergies and                         sensitivities were reviewed. The patient's tolerance                         of previous anesthesia was reviewed.                        - The risks and benefits of the procedure and the                         sedation options and risks were discussed with the                         patient. All questions were answered and informed                         consent was obtained.                        - ASA Grade Assessment: II - A patient with mild                         systemic disease.                        After obtaining informed consent, the endoscope was                         passed under direct vision. Throughout the procedure,                         the patient's blood pressure, pulse, and oxygen                         saturations were monitored continuously. The Endoscope                         was introduced through the mouth, and advanced to the  third part of duodenum. The upper GI endoscopy was                         accomplished with ease. The patient tolerated the                         procedure well. Findings:      The esophagus was normal.      The stomach was normal.      The examined  duodenum was normal.      One non-bleeding superficial gastric ulcer with a clean ulcer base       (Forrest Class III) was found at the pylorus. The lesion was 15 mm in       largest dimension. Biopsies were taken with a cold forceps for histology.      The cardia and gastric fundus were normal on retroflexion. Impression:            - Normal esophagus.                        - Normal stomach.                        - Normal examined duodenum.                        - Non-bleeding gastric ulcer with a clean ulcer base                         (Forrest Class III). Biopsied. Recommendation:        - Await pathology results.                        - Use Prilosec (omeprazole) 40 mg PO BID for 3 months.                        - No ibuprofen, naproxen, or other non-steroidal                         anti-inflammatory drugs.                        - Perform a colonoscopy today. Procedure Code(s):     --- Professional ---                        [phone], Esophagogastroduodenoscopy, flexible,                         transoral; with biopsy, single or multiple Diagnosis Code(s):     --- Professional ---                        K25.9, Gastric ulcer, unspecified as acute or chronic,                         without hemorrhage or perforation                        R10.9, Unspecified abdominal pain CPT copyright 2019 American Medical Association. All rights reserved. The codes documented in this report are preliminary and upon coder  review may  be revised to meet current compliance requirements. [name], MD [name] MD, MD 07/29/2021 1:37:08 PM This report has been signed electronically. Number of Addenda: 0 Note Initiated On: 07/29/2021 1:07 PM Estimated Blood Loss:  Estimated blood loss: none.      [facility]

## 2021-07-29 NOTE — Op Note (Signed)
[facility] Gastroenterology Patient Name: Jordan Ortega Procedure Date: 07/29/2021 1:04 PM MRN: [id number] Account #: [id number] Date of Birth: September 07, 1955 Admit Type: Outpatient Age: 66 Room: [facility] ENDO ROOM 3 Gender: Female Note Status: Finalized Instrument Name: Peds Colonoscope [id number] Procedure:             Colonoscopy Indications:           Abdominal pain Providers:             [name] MD, MD Medicines:             Monitored Anesthesia Care Complications:         No immediate complications. Procedure:             Pre-Anesthesia Assessment:                        - Prior to the procedure, a History and Physical was                         performed, and patient medications, allergies and                         sensitivities were reviewed. The patient's tolerance                         of previous anesthesia was reviewed.                        - The risks and benefits of the procedure and the                         sedation options and risks were discussed with the                         patient. All questions were answered and informed                         consent was obtained.                        - ASA Grade Assessment: II - A patient with mild                         systemic disease.                        After obtaining informed consent, the colonoscope was                         passed under direct vision. Throughout the procedure,                         the patient's blood pressure, pulse, and oxygen                         saturations were monitored continuously. The                         Colonoscope was introduced through the anus and  advanced to the the cecum, identified by the                         appendiceal orifice. The colonoscopy was performed                         with ease. The patient tolerated the procedure well.                         The quality of the bowel preparation was  excellent. Findings:      The perianal and digital rectal examinations were normal.      A 4 mm polyp was found in the cecum. The polyp was sessile. The polyp       was removed with a cold snare. Resection and retrieval were complete.       Estimated blood loss: none.      The exam was otherwise without abnormality on direct and retroflexion       views. Impression:            - One 4 mm polyp in the cecum, removed with a cold                         snare. Resected and retrieved.                        - The examination was otherwise normal on direct and                         retroflexion views. Recommendation:        - Discharge patient to home (with escort).                        - Resume previous diet.                        - Continue present medications. Procedure Code(s):     --- Professional ---                        [phone], Colonoscopy, flexible; with removal of                         tumor(s), polyp(s), or other lesion(s) by snare                         technique Diagnosis Code(s):     --- Professional ---                        K63.5, Polyp of colon                        R10.9, Unspecified abdominal pain CPT copyright 2019 American Medical Association. All rights reserved. The codes documented in this report are preliminary and upon coder review may  be revised to meet current compliance requirements. [name], MD [name] MD, MD 07/29/2021 1:57:25 PM This report has been signed electronically. Number of Addenda: 0 Note Initiated On: 07/29/2021 1:04 PM Scope Withdrawal Time: 0 hours 10 minutes 18 seconds  Total Procedure Duration: 0 hours 14 minutes 41 seconds  Estimated Blood  Loss:  Estimated blood loss: none.      [facility]

## 2021-07-30 ENCOUNTER — Encounter: Payer: Self-pay | Admitting: Gastroenterology

## 2021-07-30 ENCOUNTER — Other Ambulatory Visit: Payer: Self-pay

## 2021-07-30 LAB — SURGICAL PATHOLOGY

## 2021-07-30 MED ORDER — OMEPRAZOLE 40 MG PO CPDR: 40.0000 mg | DELAYED_RELEASE_CAPSULE | Freq: Two times a day (BID) | ORAL | 3 refills | Status: AC

## 2021-07-30 MED ORDER — SUCRALFATE 1 G PO TABS: 1.0000 g | ORAL_TABLET | Freq: Four times a day (QID) | ORAL | 0 refills | Status: DC

## 2021-08-04 NOTE — Anesthesia Postprocedure Evaluation (Signed)
Anesthesia Post Note  [name]  Procedure(s) Performed: COLONOSCOPY WITH PROPOFOL ESOPHAGOGASTRODUODENOSCOPY (EGD)  Patient location during evaluation: Endoscopy Anesthesia Type: General Level of consciousness: awake and alert Pain management: pain level controlled Vital Signs Assessment: post-procedure vital signs reviewed and stable Respiratory status: spontaneous breathing, nonlabored ventilation, respiratory function stable and patient connected to nasal cannula oxygen Cardiovascular status: blood pressure returned to baseline and stable Postop Assessment: no apparent nausea or vomiting Anesthetic complications: no   No notable events documented.   Last Vitals:  Vitals:   07/29/21 1411 07/29/21 1421  BP: 98/65 110/72  Pulse:  68  Resp:  20  Temp:    SpO2:  100%    Last Pain:  Vitals:   07/30/21 0742  TempSrc:   PainSc: 0-No pain                 Martha Clan

## 2021-08-25 ENCOUNTER — Other Ambulatory Visit: Payer: Self-pay | Admitting: Gastroenterology

## 2021-08-25 ENCOUNTER — Encounter: Payer: Self-pay | Admitting: Gastroenterology

## 2021-09-10 ENCOUNTER — Ambulatory Visit: Payer: 59 | Admitting: Dermatology

## 2021-09-10 DIAGNOSIS — Z86018 Personal history of other benign neoplasm: Secondary | ICD-10-CM | POA: Diagnosis not present

## 2021-09-10 DIAGNOSIS — L821 Other seborrheic keratosis: Secondary | ICD-10-CM

## 2021-09-10 DIAGNOSIS — L309 Dermatitis, unspecified: Secondary | ICD-10-CM

## 2021-09-10 DIAGNOSIS — Z1283 Encounter for screening for malignant neoplasm of skin: Secondary | ICD-10-CM

## 2021-09-10 DIAGNOSIS — L578 Other skin changes due to chronic exposure to nonionizing radiation: Secondary | ICD-10-CM

## 2021-09-10 DIAGNOSIS — W57XXXA Bitten or stung by nonvenomous insect and other nonvenomous arthropods, initial encounter: Secondary | ICD-10-CM

## 2021-09-10 DIAGNOSIS — L814 Other melanin hyperpigmentation: Secondary | ICD-10-CM

## 2021-09-10 DIAGNOSIS — D1800 Hemangioma unspecified site: Secondary | ICD-10-CM

## 2021-09-10 DIAGNOSIS — D229 Melanocytic nevi, unspecified: Secondary | ICD-10-CM

## 2021-09-10 MED ORDER — DOXYCYCLINE MONOHYDRATE 100 MG PO CAPS: 100.0000 mg | ORAL_CAPSULE | Freq: Once | ORAL | 0 refills | Status: AC

## 2021-09-10 MED ORDER — CLOBETASOL PROPIONATE 0.05 % EX OINT: 1.0000 | TOPICAL_OINTMENT | Freq: Two times a day (BID) | CUTANEOUS | 0 refills | Status: AC

## 2021-09-10 NOTE — Patient Instructions (Addendum)
Doxycycline should be taken with food to prevent nausea. Do not lay down for 30 minutes after taking. Be cautious with sun exposure and use good sun protection while on this medication. Pregnant women should not take this medication.   Start clobetasol 0.05% ointment to aa twice daily for up to 3 weeks. Avoid applying to face, groin, and axilla. Use as directed. Long-term use can cause thinning of the skin.  Topical steroids (such as triamcinolone, fluocinolone, fluocinonide, mometasone, clobetasol, halobetasol, betamethasone, hydrocortisone) can cause thinning and lightening of the skin if they are used for too long in the same area. Your physician has selected the right strength medicine for your problem and area affected on the body. Please use your medication only as directed by your physician to prevent side effects.   Recommend taking Heliocare sun protection supplement daily in sunny weather for additional sun protection. For maximum protection on the sunniest days, you can take up to 2 capsules of regular Heliocare OR take 1 capsule of Heliocare Ultra. For prolonged exposure (such as a full day in the sun), you can repeat your dose of the supplement 4 hours after your first dose. Heliocare can be purchased at [employer], at some [facility] or at [email or web address].    Melanoma ABCDEs  Melanoma is the most dangerous type of skin cancer, and is the leading cause of death from skin disease.  You are more likely to develop melanoma if you: Have light-colored skin, light-colored eyes, or red or blond hair Spend a lot of time in the sun Tan regularly, either outdoors or in a tanning bed Have had blistering sunburns, especially during childhood Have a close family member who has had a melanoma Have atypical moles or large birthmarks  Early detection of melanoma is key since treatment is typically straightforward and cure rates are extremely high if we catch it early.   The first sign of  melanoma is often a change in a mole or a new dark spot.  The ABCDE system is a way of remembering the signs of melanoma.  A for asymmetry:  The two halves do not match. B for border:  The edges of the growth are irregular. C for color:  A mixture of colors are present instead of an even brown color. D for diameter:  Melanomas are usually (but not always) greater than 14m - the size of a pencil eraser. E for evolution:  The spot keeps changing in size, shape, and color.  Please check your skin once per month between visits. You can use a small mirror in front and a large mirror behind you to keep an eye on the back side or your body.   If you see any new or changing lesions before your next follow-up, please call to schedule a visit.  Please continue daily skin protection including broad spectrum sunscreen SPF 30+ to sun-exposed areas, reapplying every 2 hours as needed when you're outdoors.    Due to recent changes in healthcare laws, you may see results of your pathology and/or laboratory studies on MyChart before the doctors have had a chance to review them. We understand that in some cases there may be results that are confusing or concerning to you. Please understand that not all results are received at the same time and often the doctors may need to interpret multiple results in order to provide you with the best plan of care or course of treatment. Therefore, we ask that you please give u[place]2  business days to thoroughly review all your results before contacting the office for clarification. Should we see a critical lab result, you will be contacted sooner.   If You Need Anything After Your Visit  If you have any questions or concerns for your doctor, please call our main line at [phone] and press option 4 to reach your doctor's medical assistant. If no one answers, please leave a voicemail as directed and we will return your call as soon as possible. Messages left after 4 pm will be  answered the following business day.   You may also send [place] a message via [place]. We typically respond to MyChart messages within 1-2 business days.  For prescription refills, please ask your pharmacy to contact our office. Our fax number is [phone].  If you have an urgent issue when the clinic is closed that cannot wait until the next business day, you can page your doctor at the number below.    Please note that while we do our best to be available for urgent issues outside of office hours, we are not available 24/7.   If you have an urgent issue and are unable to reach [place], you may choose to seek medical care at your doctor's office, retail clinic, urgent care center, or emergency room.  If you have a medical emergency, please immediately call 911 or go to the emergency department.  Pager Numbers  - Dr. Nehemiah Massed: [phone]  - Dr. Laurence Ferrari: [phone]  - Dr. Nicole Kindred: [phone]  In the event of inclement weather, please call our main line at [phone] for an update on the status of any delays or closures.  Dermatology Medication Tips: Please keep the boxes that topical medications come in in order to help keep track of the instructions about where and how to use these. Pharmacies typically print the medication instructions only on the boxes and not directly on the medication tubes.   If your medication is too expensive, please contact our office at [phone] option 4 or send [place] a message through [place].   We are unable to tell what your co-pay for medications will be in advance as this is different depending on your insurance coverage. However, we may be able to find a substitute medication at lower cost or fill out paperwork to get insurance to cover a needed medication.   If a prior authorization is required to get your medication covered by your insurance company, please allow [place] 1-2 business days to complete this process.  Drug prices often vary depending on  where the prescription is filled and some pharmacies may offer cheaper prices.  The website www.goodrx.com contains coupons for medications through different pharmacies. The prices here do not account for what the cost may be with help from insurance (it may be cheaper with your insurance), but the website can give you the price if you did not use any insurance.  - You can print the associated coupon and take it with your prescription to the pharmacy.  - You may also stop by our office during regular business hours and pick up a GoodRx coupon card.  - If you need your prescription sent electronically to a different pharmacy, notify our office through [facility] or by phone at [phone] option 4.     Si Usted Necesita Algo Despus de Su Visita  Tambin puede enviarnos un mensaje a travs de Pharmacist, community. Por lo general respondemos a los mensajes de MyChart en el transcurso de 1 a 2 das  hbiles.  Para renovar recetas, por favor pida a su farmacia que se ponga en contacto con nuestra oficina. Harland Dingwall de fax es [place] [phone].  Si tiene un asunto urgente cuando la clnica est cerrada y que no puede esperar hasta el siguiente da hbil, puede llamar/localizar a su doctor(a) al nmero que aparece a continuacin.   Por favor, tenga en cuenta que aunque hacemos todo lo posible para estar disponibles para asuntos urgentes fuera del horario de [place], no estamos disponibles las 24 horas del da, los 7 das de la [place].   Si tiene un problema urgente y no puede comunicarse con nosotros, puede optar por buscar atencin mdica  en el consultorio de su doctor(a), en una clnica privada, en un centro de atencin urgente o en una sala de emergencias.  Si tiene Engineering geologist, por favor llame inmediatamente al 911 o vaya a la sala de emergencias.  Nmeros de bper  - Dr. Nehemiah Massed: [phone]  - Dra. Moye: [phone]  - Dra. Nicole Kindred: [phone]  En caso de inclemencias  del [place], por favor llame a Johnsie Kindred principal al [phone] para una actualizacin sobre el [place] de cualquier retraso o cierre.  Consejos para la medicacin en dermatologa: Por favor, guarde las cajas en las que vienen los medicamentos de uso tpico para ayudarle a seguir las instrucciones sobre dnde y cmo usarlos. Las farmacias generalmente imprimen las instrucciones del medicamento slo en las cajas y no directamente en los tubos del [place].   Si su medicamento es muy caro, por favor, pngase en contacto con Zigmund Daniel llamando al [phone] y presione la opcin 4 o envenos un mensaje a travs de Pharmacist, community.   No podemos decirle cul ser su copago por los medicamentos por adelantado ya que esto es diferente dependiendo de la cobertura de su seguro. Sin embargo, es posible que podamos encontrar un medicamento sustituto a Electrical engineer un formulario para que el seguro cubra el medicamento que se considera necesario.   Si se requiere una autorizacin previa para que su compaa de seguros [place] su medicamento, por favor permtanos de 1 a 2 das hbiles para completar este proceso.  Los precios de los medicamentos varan con frecuencia dependiendo del Environmental consultant de dnde se surte la receta y alguna farmacias pueden ofrecer precios ms baratos.  El sitio web www.goodrx.com tiene cupones para medicamentos de Airline pilot. Los precios aqu no tienen en cuenta lo que podra costar con la ayuda del seguro (puede ser ms barato con su seguro), pero el sitio web puede darle el precio si no utiliz Research scientist (physical sciences).  - Puede imprimir el cupn correspondiente y llevarlo con su receta a la farmacia.  - Tambin puede pasar por nuestra oficina durante el horario de atencin regular y Charity fundraiser una tarjeta de cupones de GoodRx.  - Si necesita que su receta se enve electrnicamente a una farmacia diferente, informe a nuestra oficina a travs de MyChart de [place] o por telfono  llamando al [phone] y presione la opcin 4.

## 2021-09-10 NOTE — Progress Notes (Signed)
Follow-Up Visit   Subjective  Jordan Ortega is a 66 y.o. female who presents for the following: Annual Exam (The patient presents for Total-Body Skin Exam (TBSE) for skin cancer screening and mole check.  The patient has spots, moles and lesions to be evaluated, some may be new or changing and the patient has concerns that these could be cancer. Patient with hx of dysplastic nevi.) and Rash (Patient with rash at right thumb, present for 6-8 months. Itches and burns, turns red and peels and then it is painful. ).   The following portions of the chart were reviewed this encounter and updated as appropriate:   Tobacco  Allergies  Meds  Problems  Med Hx  Surg Hx  Fam Hx      Review of Systems:  No other skin or systemic complaints except as noted in HPI or Assessment and Plan.  Objective  Well appearing patient in no apparent distress; mood and affect are within normal limits.  A full examination was performed including scalp, head, eyes, ears, nose, lips, neck, chest, axillae, abdomen, back, buttocks, bilateral upper extremities, bilateral lower extremities, hands, feet, fingers, toes, fingernails, and toenails. All findings within normal limits unless otherwise noted below.  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 Condition is bothersome/symptomatic for patient. Currently flared.  Hand Dermatitis is a chronic type of eczema that can come and go on the hands and fingers.  While there is no cure, the rash and symptoms can be managed with topical prescription medications, and for more severe cases, with systemic medications.  Recommend mild soap and routine use of moisturizing cream after handwashing.  Minimize soap/water exposure when possible.    No joint pain  Start clobetasol 0.05%  ointment to aa twice daily for up to 3 weeks. Avoid applying to face, groin, and axilla. Use as directed. Long-term use can cause thinning of the skin.  Topical steroids (such as triamcinolone, fluocinolone, fluocinonide, mometasone, clobetasol, halobetasol, betamethasone, hydrocortisone) can cause thinning and lightening of the skin if they are used for too long in the same area. Your physician has selected the right strength medicine for your problem and area affected on the body. Please use your medication only as directed by your physician to prevent side effects.   If persistent despite therapy or frequently recurrent, consider non-steroid therapy Eucrisa, Opzelura, or tacrolimus.  clobetasol ointment (TEMOVATE) 0.05 % - right palmar surface of thumb Apply 1 Application topically 2 (two) times daily. To affected area rash. Avoid applying to face, groin, and axilla. Use as directed. Long-term use can cause thinning of the skin.  Reaction to insect bite Right Thigh  Patient pulled tick off last Friday, unsure how long it was there.   Start doxycycline monohydrate 100 mg 2 po for one dose.  Doxycycline should be taken with food to prevent nausea. Do not lay down for 30 minutes after taking. Be cautious with sun exposure and use good sun protection while on this medication. Pregnant women should not take this medication.    doxycycline (MONODOX) 100 MG capsule - Right Thigh Take 1 capsule (100 mg total) by mouth once for 1 dose. Take with food   History of Dysplastic Nevi - No evidence of recurrence today - Recommend regular full body skin exams - Recommend  daily broad spectrum sunscreen SPF 30+ to sun-exposed areas, reapply every 2 hours as needed.  - Call if any new or changing lesions are noted between office visits  Lentigines - Scattered tan macules - Due to sun exposure - Benign-appearing, observe - Recommend daily broad spectrum sunscreen SPF 30+ to sun-exposed areas, reapply  every 2 hours as needed. - Call for any changes  Seborrheic Keratoses - Stuck-on, waxy, tan-brown papules and/or plaques  - Benign-appearing - Discussed benign etiology and prognosis. - Observe - Call for any changes  Melanocytic Nevi - Tan-brown and/or pink-flesh-colored symmetric macules and papules - Benign appearing on exam today - Observation - Call clinic for new or changing moles - Recommend daily use of broad spectrum spf 30+ sunscreen to sun-exposed areas.   Hemangiomas - Red papules - Discussed benign nature - Observe - Call for any changes  Actinic Damage - Chronic condition, secondary to cumulative UV/sun exposure - diffuse scaly erythematous macules with underlying dyspigmentation - Recommend daily broad spectrum sunscreen SPF 30+ to sun-exposed areas, reapply every 2 hours as needed.  - Staying in the shade or wearing long sleeves, sun glasses (UVA+UVB protection) and wide brim hats (4-inch brim around the entire circumference of the hat) are also recommended for sun protection.  - Call for new or changing lesions.  Skin cancer screening performed today.  Return in about 1 year (around 09/11/2022) for TBSE.  Graciella Belton, RMA, am acting as scribe for Forest Gleason, MD .  Documentation: I have reviewed the above documentation for accuracy and completeness, and I agree with the above.  Forest Gleason, MD

## 2021-09-24 ENCOUNTER — Encounter: Payer: Self-pay | Admitting: Dermatology

## 2021-10-06 ENCOUNTER — Other Ambulatory Visit: Payer: Self-pay | Admitting: Internal Medicine

## 2021-10-06 ENCOUNTER — Other Ambulatory Visit (HOSPITAL_COMMUNITY): Payer: Self-pay | Admitting: Internal Medicine

## 2021-10-06 DIAGNOSIS — M810 Age-related osteoporosis without current pathological fracture: Secondary | ICD-10-CM

## 2021-10-06 DIAGNOSIS — Z1231 Encounter for screening mammogram for malignant neoplasm of breast: Secondary | ICD-10-CM

## 2021-10-12 ENCOUNTER — Ambulatory Visit
Admission: RE | Admit: 2021-10-12 | Discharge: 2021-10-12 | Disposition: A | Discharge status: Home / Self Care | Payer: 59 | Source: Ambulatory Visit | Attending: Internal Medicine | Admitting: Internal Medicine

## 2021-10-12 DIAGNOSIS — M810 Age-related osteoporosis without current pathological fracture: Secondary | ICD-10-CM | POA: Insufficient documentation

## 2021-10-16 DIAGNOSIS — R7303 Prediabetes: Secondary | ICD-10-CM | POA: Insufficient documentation

## 2021-10-22 ENCOUNTER — Ambulatory Visit: Payer: BC Managed Care – PPO | Admitting: Gastroenterology

## 2021-11-25 ENCOUNTER — Ambulatory Visit
Admission: RE | Admit: 2021-11-25 | Discharge: 2021-11-25 | Disposition: A | Discharge status: Home / Self Care | Payer: 59 | Source: Ambulatory Visit | Attending: Internal Medicine | Admitting: Internal Medicine

## 2021-11-25 DIAGNOSIS — Z1231 Encounter for screening mammogram for malignant neoplasm of breast: Secondary | ICD-10-CM | POA: Insufficient documentation

## 2021-11-30 ENCOUNTER — Encounter (INDEPENDENT_AMBULATORY_CARE_PROVIDER_SITE_OTHER): Payer: Self-pay

## 2021-12-02 DIAGNOSIS — G44201 Tension-type headache, unspecified, intractable: Secondary | ICD-10-CM | POA: Diagnosis not present

## 2021-12-02 DIAGNOSIS — J209 Acute bronchitis, unspecified: Secondary | ICD-10-CM | POA: Diagnosis not present

## 2021-12-02 DIAGNOSIS — M533 Sacrococcygeal disorders, not elsewhere classified: Secondary | ICD-10-CM | POA: Diagnosis not present

## 2021-12-03 ENCOUNTER — Ambulatory Visit: Payer: Medicare HMO | Admitting: Neurology

## 2021-12-03 DIAGNOSIS — R202 Paresthesia of skin: Secondary | ICD-10-CM

## 2021-12-03 DIAGNOSIS — M79672 Pain in left foot: Secondary | ICD-10-CM | POA: Diagnosis not present

## 2021-12-03 DIAGNOSIS — M79671 Pain in right foot: Secondary | ICD-10-CM | POA: Diagnosis not present

## 2021-12-03 NOTE — Procedures (Signed)
[facility]  [place], [place]  [place], [place] [address] Tel: [phone] Fax: [phone] Test Date:  12/03/2021  [name] DOB: 08-19-1955 Physician: Narda Amber, DO  Sex: Female Height: '5\' 0"'$  Ref Phys: Narda Amber, DO  ID#: [id number]   Technician:    History: This is a 66 year old female referred for evaluation of bilateral feet and leg pain.  NCV & EMG Findings: Electrodiagnostic testing of the right lower extremity and additional studies of the left shows: Bilateral sural and superficial peroneal sensory responses are within normal limits. Bilateral peroneal and tibial motor responses are within normal limits. Bilateral tibial H reflex studies are within normal limits. There is no evidence of active or chronic motor axonal changes affecting any of the tested muscles.  Motor unit configuration and recruitment pattern is within normal limits.   Impression: This is a normal study of the lower extremities.  In particular, there is no evidence of a large fiber sensorimotor polyneuropathy or lumbosacral radiculopathy.   ___________________________ Narda Amber, DO    Nerve Conduction Studies   Stim Site NR Peak (ms) Norm Peak (ms) O-P Amp (V) Norm O-P Amp  Left Sup Peroneal Anti Sensory (Ant Lat Mall)  33 C  12 cm    2.3 <4.6 10.0 >3  Right Sup Peroneal Anti Sensory (Ant Lat Mall)  33 C  12 cm    1.9 <4.6 10.8 >3  Left Sural Anti Sensory (Lat Mall)  33 C  Calf    2.9 <4.6 21.6 >3  Right Sural Anti Sensory (Lat Mall)  33 C  Calf    2.3 <4.6 25.1 >3     Stim Site NR Onset (ms) Norm Onset (ms) O-P Amp (mV) Norm O-P Amp Site1 Site2 Delta-0 (ms) Dist (cm) Vel (m/s) Norm Vel (m/s)  Left Peroneal Motor (Ext Dig Brev)  33 C  Ankle    4.1 <6.0 4.6 >2.5 B Fib Ankle 6.0 29.0 48 >40  B Fib    10.1  4.1  Poplt B Fib 1.5 7.0 47 >40  Poplt    11.6  4.0         Right Peroneal Motor (Ext Dig Brev)  33 C  Ankle    3.4 <6.0 4.2 >2.5 B Fib  Ankle 6.7 32.0 48 >40  B Fib    10.1  4.0  Poplt B Fib 1.4 7.0 50 >40  Poplt    11.5  3.8         Left Tibial Motor (Abd Hall Brev)  33 C  Ankle    3.4 <6.0 17.2 >4 Knee Ankle 7.1 39.0 55 >40  Knee    10.5  13.8         Right Tibial Motor (Abd Hall Brev)  33 C  Ankle    3.1 <6.0 16.0 >4 Knee Ankle 6.7 39.0 58 >40  Knee    9.8  13.1          Electromyography   Side Muscle Ins.Act Fibs Fasc Recrt Amp Dur Poly Activation Comment  Right AntTibialis Nml Nml Nml Nml Nml Nml Nml Nml N/A  Right Gastroc Nml Nml Nml Nml Nml Nml Nml Nml N/A  Right Flex Dig Long Nml Nml Nml Nml Nml Nml Nml Nml N/A  Right RectFemoris Nml Nml Nml Nml Nml Nml Nml Nml N/A  Left BicepsFemS Nml Nml Nml Nml Nml Nml Nml Nml N/A  Right GluteusMed Nml Nml Nml Nml Nml Nml Nml Nml N/A  Left AntTibialis Nml Nml Nml Nml Nml Nml Nml Nml N/A  Left Gastroc Nml Nml Nml Nml Nml Nml Nml Nml N/A  Left Flex Dig Long Nml Nml Nml Nml Nml Nml Nml Nml N/A  Left RectFemoris Nml Nml Nml Nml Nml Nml Nml Nml N/A      Waveforms:

## 2021-12-08 ENCOUNTER — Telehealth: Payer: Self-pay | Admitting: Neurology

## 2021-12-08 NOTE — Telephone Encounter (Signed)
Pt called in to get her EMG results

## 2021-12-08 NOTE — Telephone Encounter (Signed)
Please let pt know nerve testing was normal and did not show neuropathy.  There is one more test we can do to look for small fiber neuropathy, which involves a skin biopsy.  If she would like to proceed, please ask the front to schedule her for skin biopsy with Dr. Berdine Addison. Thank you.

## 2021-12-09 ENCOUNTER — Other Ambulatory Visit: Payer: Self-pay

## 2021-12-09 NOTE — Telephone Encounter (Signed)
See Result Notes.

## 2021-12-10 DIAGNOSIS — H2513 Age-related nuclear cataract, bilateral: Secondary | ICD-10-CM | POA: Diagnosis not present

## 2022-01-05 ENCOUNTER — Ambulatory Visit (INDEPENDENT_AMBULATORY_CARE_PROVIDER_SITE_OTHER): Payer: Medicare HMO | Admitting: Neurology

## 2022-01-05 DIAGNOSIS — R202 Paresthesia of skin: Secondary | ICD-10-CM

## 2022-01-05 DIAGNOSIS — F319 Bipolar disorder, unspecified: Secondary | ICD-10-CM | POA: Diagnosis not present

## 2022-01-05 DIAGNOSIS — J432 Centrilobular emphysema: Secondary | ICD-10-CM | POA: Diagnosis not present

## 2022-01-05 DIAGNOSIS — R209 Unspecified disturbances of skin sensation: Secondary | ICD-10-CM | POA: Diagnosis not present

## 2022-01-05 NOTE — Progress Notes (Signed)
Punch Biopsy Procedure Note  Preprocedure Diagnosis: paresthesia of skin   Postprocedure Diagnosis: same  Locations: Site 1: left  lateral ankle ;  Site 2: left  lateral proximal thigh ;   Indications: r/o small fiber neuropathy  Anesthesia: 5 mL Lidocaine 1% without epinephrine  Procedure Details Patient informed of the risks (including but not limited to bleeding, pain, infection, scar and infection) and benefits of the procedure.  Informed consent obtained.  The areas which were chosen for biopsy, as above, and surrounding areas were given a sterile prep using iodine and draped in the usual sterile fashion. The skin was then stretched perpendicular to the skin tension lines and sample removed using the 3 mm punch. Pressure applied, hemostasis achieved.   Dressing applied. The specimen(s) was sent for pathologic examination. The patient tolerated the procedure well.  Estimated Blood Loss: 1 ml  Condition: Stable  Complications: none.  Plan: 1. Instructed to keep the wound dry and covered for 24h and clean thereafter. 2. Warning signs of infection were reviewed.    Kai Levins, MD [facility]

## 2022-01-18 ENCOUNTER — Telehealth: Payer: Self-pay | Admitting: Internal Medicine

## 2022-01-20 ENCOUNTER — Telehealth: Payer: Self-pay | Admitting: Neurology

## 2022-01-20 NOTE — Telephone Encounter (Signed)
Please let pt know that her skin biopsy is normal.  With both her skin biopsy and EMGs being normal, there is no evidence that her feet pain is due to neuropathy.  MRI lumbar spine also does not show nerve impingement.  Therefore, there is no primary neurological condition to explain her feet pain.  I recommend that she follow-up with PCP to see what her options are for ongoing management of her feet pain.

## 2022-01-20 NOTE — Telephone Encounter (Signed)
I left a message for the patient to return my call.

## 2022-01-21 ENCOUNTER — Telehealth: Payer: Self-pay | Admitting: Neurology

## 2022-01-21 NOTE — Telephone Encounter (Signed)
Called and spoke w/ pt  notified pt of her results of mri, emg and skin bx . Informed pt that her results were normal.pt will need  follow with her PCP regarding her feet pain.  Pt stated that she understood her results and will follow up with her PCP regarding her pain in her feet.

## 2022-01-21 NOTE — Telephone Encounter (Signed)
Pt' called in and left a message. She is returning Tarsha's call about results

## 2022-01-21 NOTE — Telephone Encounter (Signed)
Pt given results see other phone note

## 2022-01-26 DIAGNOSIS — G43909 Migraine, unspecified, not intractable, without status migrainosus: Secondary | ICD-10-CM | POA: Diagnosis not present

## 2022-02-08 ENCOUNTER — Other Ambulatory Visit: Payer: Self-pay | Admitting: Family Medicine

## 2022-02-08 ENCOUNTER — Ambulatory Visit
Admission: RE | Admit: 2022-02-08 | Discharge: 2022-02-08 | Disposition: A | Discharge status: Home / Self Care | Payer: Medicare HMO | Source: Ambulatory Visit | Attending: Family Medicine | Admitting: Family Medicine

## 2022-02-08 DIAGNOSIS — M48062 Spinal stenosis, lumbar region with neurogenic claudication: Secondary | ICD-10-CM | POA: Diagnosis not present

## 2022-02-08 DIAGNOSIS — M5416 Radiculopathy, lumbar region: Secondary | ICD-10-CM

## 2022-02-08 DIAGNOSIS — M62830 Muscle spasm of back: Secondary | ICD-10-CM | POA: Diagnosis not present

## 2022-02-08 DIAGNOSIS — M5136 Other intervertebral disc degeneration, lumbar region: Secondary | ICD-10-CM | POA: Diagnosis not present

## 2022-02-08 DIAGNOSIS — M47816 Spondylosis without myelopathy or radiculopathy, lumbar region: Secondary | ICD-10-CM | POA: Diagnosis not present

## 2022-02-09 DIAGNOSIS — M5416 Radiculopathy, lumbar region: Secondary | ICD-10-CM | POA: Diagnosis not present

## 2022-02-09 DIAGNOSIS — M48062 Spinal stenosis, lumbar region with neurogenic claudication: Secondary | ICD-10-CM | POA: Diagnosis not present

## 2022-02-11 DIAGNOSIS — M8588 Other specified disorders of bone density and structure, other site: Secondary | ICD-10-CM | POA: Diagnosis not present

## 2022-02-11 DIAGNOSIS — M545 Low back pain, unspecified: Secondary | ICD-10-CM | POA: Diagnosis not present

## 2022-02-11 NOTE — Telephone Encounter (Signed)
Opened in error

## 2022-02-17 ENCOUNTER — Ambulatory Visit: Payer: Medicare HMO | Admitting: Neurology

## 2022-02-22 ENCOUNTER — Ambulatory Visit (INDEPENDENT_AMBULATORY_CARE_PROVIDER_SITE_OTHER): Payer: Medicare HMO | Admitting: Neurology

## 2022-02-22 ENCOUNTER — Encounter: Payer: Self-pay | Admitting: Neurology

## 2022-02-22 VITALS — BP 115/76 | HR 77 | Ht 60.0 in | Wt 102.0 lb

## 2022-02-22 DIAGNOSIS — R519 Headache, unspecified: Secondary | ICD-10-CM

## 2022-02-22 DIAGNOSIS — M79671 Pain in right foot: Secondary | ICD-10-CM | POA: Diagnosis not present

## 2022-02-22 DIAGNOSIS — G8929 Other chronic pain: Secondary | ICD-10-CM

## 2022-02-22 DIAGNOSIS — M79672 Pain in left foot: Secondary | ICD-10-CM | POA: Diagnosis not present

## 2022-02-22 DIAGNOSIS — J432 Centrilobular emphysema: Secondary | ICD-10-CM | POA: Diagnosis not present

## 2022-02-22 DIAGNOSIS — F319 Bipolar disorder, unspecified: Secondary | ICD-10-CM | POA: Diagnosis not present

## 2022-02-22 NOTE — Progress Notes (Signed)
Follow-up Visit   Date: 02/22/2022    Jordan Ortega MRN: [id number] DOB: 03-13-1955    Jordan Ortega is a 67 y.o. right-handed Caucasian female with depression, anxiety, cervical disease, GERD, fibromylagia, and migraines returning to the clinic for follow-up of bilateral feet and hand pain.  The patient was accompanied to the clinic by self.   IMPRESSION/PLAN: Chronic bilateral feet pain.  Prior testing including NCS/EMG of the legs, skin biopsy, and MRI lumbar spine which has all essentially been normal.  MRI lumbar spine shows mild degenerative changes without nerve root impingement.  Informed patient that she does not have a primary neuromuscular condition for her pain and she should continue symptom management with pain management.   Chronic migraine, exacerbated by barometric changes.  She is on a number of medications for migraines which does not provide relief and has a long history of headaches in the past.  Given her refractory headaches and polypharmacy, she will be best suited to be managed by a Headache specialist for management (I am neuromuscular specialist).  She will discuss with her PCP for this referral.    --------------------------------------------- History of present illness: She has a long history of bilateral feet and leg pain for at least the past 10 years. She has seen Dr. Manuella Ghazi at [facility] and had NCS/EMG of the legs which shows left S1 radiculopathy, no neuropathy.  MRI lumbar spine from 2021 did not show any nerve impingement of the lumbar spine.  Symptoms managed with gabapentin '300mg'$  at bedtime.  Early 2023, she has worsening burning and needle sensation involving both feet and the back of her calf.  Symptoms are worse with prolonged standing and in the evening.  Nothing improves her symptoms.  PCP increased her gabapentin to '600mg'$  at bedtime, but she felt groggy in the morning so now takes gabapentin '400mg'$  at bedtime.  She also takes  Cymbalta '60mg'$  and hydrocodone for back pain.   No improvement with trial of requip.    Around the same time she developed similar sensation over the palms of the hands, which is milder and intermittent.  It occurs 1-2 times per month, lasting 4-5 hours.  She has sensation of pressure in the feet when laying in the bed.    No weakness in the hands or feet.  No imbalance or falls.  She walks unassisted.    She lives at home with husband in a two-level home.  She smokes 1ppd x 48 years.  She rarely drinks alcohol.  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   UPDATE 02/22/2022:  She is here for follow-up visit.  Prior testing including NCS/EMG of the legs and skin biopsy is normal.  Today, she comes for evaluation of migraines.  She had migraines since her 67s.  She has seen headache specialist and tried a number of medications, including botox.  Currently, she takes Aimovig '140mg'$  for migraines. She also takes baclofen '10mg'$  TID, flexeril, cymbalta '60mg'$ , gabapentin '400mg'$  qhs, mirtazapine '30mg'$  daily, and hydrocodone for pain.  Her migraines are well-controlled but she reports having right sided headaches which are triggered by weather changes, which is getting worse.    Medications:  Current Outpatient Medications on File Prior to Visit  Medication Sig Dispense Refill   AIMOVIG 140 MG/ML SOAJ Inject into the skin.     albuterol (PROVENTIL HFA;VENTOLIN HFA) 108 (90 Base) MCG/ACT inhaler Inhale 2 puffs into the lungs every 6 (six) hours as needed for  wheezing or shortness of breath. 1 Inhaler 2   ALPRAZolam (XANAX) 0.5 MG tablet Take 1 tablet (0.5 mg total) by mouth 3 (three) times daily as needed for anxiety. 90 tablet 0   baclofen (LIORESAL) 10 MG tablet Take 1 tablet (10 mg total) by mouth 3 (three) times daily. 270 tablet 3   baclofen (LIORESAL) 10 MG tablet Take 1 tablet by mouth 3 (three) times daily.     budesonide-formoterol (SYMBICORT) 160-4.5 MCG/ACT inhaler Inhale 1 puff into the  lungs 2 (two) times daily. 1 Inhaler 0   butalbital-acetaminophen-caffeine (FIORICET) 50-325-40 MG tablet Take 1 tablet by mouth daily as needed.     calcium carbonate (CALCIUM 600) 600 MG TABS tablet Take 600 mg by mouth 2 (two) times daily with a meal.     CHOLECALCIFEROL PO Take by mouth. 1000 units     clobetasol ointment (TEMOVATE) 2.11 % Apply 1 Application topically 2 (two) times daily. To affected area rash. Avoid applying to face, groin, and axilla. Use as directed. Long-term use can cause thinning of the skin. 60 g 0   cyanocobalamin 1000 MCG tablet Take 1,000 mcg by mouth daily.     cyclobenzaprine (FLEXERIL) 5 MG tablet Take 1 tablet (5 mg total) by mouth 3 (three) times daily as needed for muscle spasms. 30 tablet 5   dicyclomine (BENTYL) 20 MG tablet Take 1 tablet (20 mg total) by mouth every 8 (eight) hours as needed for spasms (Abdominal cramping). 15 tablet 0   diphenhydrAMINE (BENADRYL) 50 MG capsule 1 capsule 1 hour prior to ESI     DULoxetine (CYMBALTA) 60 MG capsule Take 1 capsule by mouth daily.     Ferrous Fumarate-Vitamin C ER 65-25 MG TBCR Take 1 tablet by mouth daily.     gabapentin (NEURONTIN) 400 MG capsule Take 1 capsule by mouth at bedtime.     HYDROcodone-acetaminophen (NORCO/VICODIN) 5-325 MG tablet Take 1 tablet by mouth every 4 (four) hours as needed for moderate pain or severe pain. 90 tablet 0   loratadine (CLARITIN) 10 MG tablet Take 10 mg by mouth 2 (two) times daily.     loratadine (CLARITIN) 10 MG tablet Take by mouth.     magnesium gluconate (MAGONATE) 500 MG tablet Take by mouth.     meloxicam (MOBIC) 15 MG tablet Take 1 tablet by mouth daily.     mirtazapine (REMERON) 30 MG tablet Take 30 mg by mouth at bedtime.     Multiple Vitamin (MULTIVITAMIN) tablet Take 1 tablet by mouth daily.     omeprazole (PRILOSEC) 40 MG capsule Take 1 capsule (40 mg total) by mouth in the morning and at bedtime. 180 capsule 3   promethazine (PHENERGAN) 25 MG tablet Take 1  tablet (25 mg total) by mouth every 8 (eight) hours as needed for nausea or vomiting. 30 tablet 0   simvastatin (ZOCOR) 20 MG tablet TAKE ONE TABLET BY MOUTH AT BEDTIME 90 tablet 3   sucralfate (CARAFATE) 1 g tablet TAKE 1 TABLET BY MOUTH 4 TIMES DAILY 120 tablet 0   buPROPion ER (WELLBUTRIN SR) 100 MG 12 hr tablet Take 100 mg by mouth 2 (two) times daily. (Patient not taking: Reported on 02/22/2022)     dicyclomine (BENTYL) 20 MG tablet Take 1 tablet by mouth every 6 (six) hours.     ketorolac (TORADOL) 10 MG tablet Take 10 mg by mouth every 6 (six) hours as needed. (Patient not taking: Reported on 02/22/2022)     lubiprostone (AMITIZA) 24  MCG capsule Take 1 capsule by mouth 1 day or 1 dose. (Patient not taking: Reported on 02/22/2022)     ondansetron (ZOFRAN-ODT) 4 MG disintegrating tablet Take 1 tablet (4 mg total) by mouth every 6 (six) hours as needed for nausea or vomiting. (Patient not taking: Reported on 02/22/2022) 20 tablet 0   No current facility-administered medications on file prior to visit.    Allergies:  Allergies  Allergen Reactions   Diazepam Other (See Comments)    Other reaction(s): Hallucination Patient states "it makes me crazy" REACTION: Behavioral problems   Ciprofloxacin Other (See Comments)    Other reaction(s): Unknown Loopy   Codeine Hives and Itching    REACTION: Dizziness   Metoprolol     Migraine/headaches    Morphine Hives    Other reaction(s): Unknown   Niacin And Related Other (See Comments)    flushing   Topiramate Other (See Comments)   Toradol [Ketorolac Tromethamine] Itching    Tolerates when taken with Benadryl   Doxycycline Diarrhea    Vital Signs:  BP 115/76   Pulse 77   Ht 5' (1.524 m)   Wt 102 lb (46.3 kg)   SpO2 97%   BMI 19.92 kg/m   Neurological Exam: MENTAL STATUS including orientation to time, place, person, recent and remote memory, attention span and concentration, language, and fund of knowledge is normal.  Speech is not  dysarthric.  CRANIAL NERVES:    Face is symmetric.   MOTOR:  Motor strength is 5/5 in all extremities.  No atrophy, fasciculations or abnormal movements.  No pronator drift.  Tone is normal.    MSRs:  Reflexes are 2+/4 throughout.  SENSORY:  Intact to vibration throughout.  COORDINATION/GAIT:  Normal finger-to- nose-finger.  Intact rapid alternating movements bilaterally.  Gait narrow based and stable.   Data: MRI brain wwo contrast 01/11/2019: 1. No evidence of acute intracranial abnormality. 2. Mild scattered T2 hyperintensity within the cerebral white matter is unchanged and nonspecific, but consistent with chronic small vessel ischemic disease.   NCS/EMG of the legs 02/16/2016:   This is an abnormal electrodiagnostic exam consistent with a chronic, non active, S1 radiculopathy on the left. No evidence of generalized peripheral neuropathy.      MRI cervical spine wo contrast 01/28/2016: 1. Severe facet arthritis on the left at C3-C4 with edema in the facets. Mild left neuroforaminal stenosis.  2. Degenerative disc disease at C6-C7 with prominent uncovertebral spur on the left with moderate left neuroforaminal stenosis.   MRI lumbar spine wo contrast 11/07/2019: 1. Remote appearing compression fracture of T12 with mild retropulsion involving the posterosuperior aspect of the vertebral body. No significant spinal or foraminal stenosis.  2. No disc protrusions, spinal or foraminal stenosis in the lumbar spine.     NCS/EMG of the legs 12/03/2021: This is a normal study of the lower extremities.  In particular, there is no evidence of a large fiber sensorimotor polyneuropathy or lumbosacral radiculopathy.   Skin biopsy 01/05/2022:  Normal   MRI lumbar spine wo contrast 02/08/2022:   1. Mild multilevel degenerative changes of the lumbar spine, similar to prior study. 2. Mild right foraminal stenosis at L4-L5. 3. Mild canal stenosis at T11-T12 with borderline-mild bilateral foraminal  stenosis. 4. Chronic superior endplate compression fracture at T12. No acute fractures.   Thank you for allowing me to participate in patient's care.  If I can answer any additional questions, I would be pleased to do so.    Sincerely,  Iyan Flett K. Posey Pronto, DO

## 2022-02-23 DIAGNOSIS — M5416 Radiculopathy, lumbar region: Secondary | ICD-10-CM | POA: Diagnosis not present

## 2022-03-02 DIAGNOSIS — M7918 Myalgia, other site: Secondary | ICD-10-CM | POA: Diagnosis not present

## 2022-03-02 DIAGNOSIS — M5136 Other intervertebral disc degeneration, lumbar region: Secondary | ICD-10-CM | POA: Diagnosis not present

## 2022-03-15 ENCOUNTER — Ambulatory Visit: Payer: Medicare HMO | Admitting: Neurology

## 2022-03-17 DIAGNOSIS — G43019 Migraine without aura, intractable, without status migrainosus: Secondary | ICD-10-CM | POA: Diagnosis not present

## 2022-03-17 DIAGNOSIS — F5101 Primary insomnia: Secondary | ICD-10-CM | POA: Diagnosis not present

## 2022-03-25 DIAGNOSIS — G43719 Chronic migraine without aura, intractable, without status migrainosus: Secondary | ICD-10-CM | POA: Diagnosis not present

## 2022-03-25 DIAGNOSIS — Z049 Encounter for examination and observation for unspecified reason: Secondary | ICD-10-CM | POA: Diagnosis not present

## 2022-03-30 DIAGNOSIS — G43719 Chronic migraine without aura, intractable, without status migrainosus: Secondary | ICD-10-CM | POA: Diagnosis not present

## 2022-03-30 DIAGNOSIS — M542 Cervicalgia: Secondary | ICD-10-CM | POA: Diagnosis not present

## 2022-03-30 DIAGNOSIS — M791 Myalgia, unspecified site: Secondary | ICD-10-CM | POA: Diagnosis not present

## 2022-03-30 DIAGNOSIS — Z79899 Other long term (current) drug therapy: Secondary | ICD-10-CM | POA: Diagnosis not present

## 2022-03-30 DIAGNOSIS — G518 Other disorders of facial nerve: Secondary | ICD-10-CM | POA: Diagnosis not present

## 2022-04-02 DIAGNOSIS — I700 Atherosclerosis of aorta: Secondary | ICD-10-CM | POA: Insufficient documentation

## 2022-04-14 DIAGNOSIS — G43719 Chronic migraine without aura, intractable, without status migrainosus: Secondary | ICD-10-CM | POA: Diagnosis not present

## 2022-04-14 DIAGNOSIS — M542 Cervicalgia: Secondary | ICD-10-CM | POA: Diagnosis not present

## 2022-04-14 DIAGNOSIS — M791 Myalgia, unspecified site: Secondary | ICD-10-CM | POA: Diagnosis not present

## 2022-04-14 DIAGNOSIS — G518 Other disorders of facial nerve: Secondary | ICD-10-CM | POA: Diagnosis not present

## 2022-04-26 ENCOUNTER — Ambulatory Visit
Admission: RE | Admit: 2022-04-26 | Discharge: 2022-04-26 | Disposition: A | Discharge status: Home / Self Care | Payer: Medicare HMO | Source: Ambulatory Visit | Attending: Internal Medicine | Admitting: Internal Medicine

## 2022-04-26 DIAGNOSIS — I2510 Atherosclerotic heart disease of native coronary artery without angina pectoris: Secondary | ICD-10-CM | POA: Diagnosis not present

## 2022-04-26 DIAGNOSIS — Z87891 Personal history of nicotine dependence: Secondary | ICD-10-CM | POA: Diagnosis not present

## 2022-04-26 DIAGNOSIS — S22089A Unspecified fracture of T11-T12 vertebra, initial encounter for closed fracture: Secondary | ICD-10-CM | POA: Diagnosis not present

## 2022-04-26 DIAGNOSIS — F17210 Nicotine dependence, cigarettes, uncomplicated: Secondary | ICD-10-CM

## 2022-04-26 DIAGNOSIS — Z122 Encounter for screening for malignant neoplasm of respiratory organs: Secondary | ICD-10-CM | POA: Diagnosis not present

## 2022-04-27 ENCOUNTER — Other Ambulatory Visit: Payer: Self-pay

## 2022-04-27 DIAGNOSIS — M791 Myalgia, unspecified site: Secondary | ICD-10-CM | POA: Diagnosis not present

## 2022-04-27 DIAGNOSIS — F17210 Nicotine dependence, cigarettes, uncomplicated: Secondary | ICD-10-CM

## 2022-04-27 DIAGNOSIS — Z87891 Personal history of nicotine dependence: Secondary | ICD-10-CM

## 2022-04-27 DIAGNOSIS — M542 Cervicalgia: Secondary | ICD-10-CM | POA: Diagnosis not present

## 2022-04-27 DIAGNOSIS — G43719 Chronic migraine without aura, intractable, without status migrainosus: Secondary | ICD-10-CM | POA: Diagnosis not present

## 2022-04-27 DIAGNOSIS — G518 Other disorders of facial nerve: Secondary | ICD-10-CM | POA: Diagnosis not present

## 2022-05-11 DIAGNOSIS — G43719 Chronic migraine without aura, intractable, without status migrainosus: Secondary | ICD-10-CM | POA: Diagnosis not present

## 2022-05-11 DIAGNOSIS — G518 Other disorders of facial nerve: Secondary | ICD-10-CM | POA: Diagnosis not present

## 2022-05-11 DIAGNOSIS — M791 Myalgia, unspecified site: Secondary | ICD-10-CM | POA: Diagnosis not present

## 2022-05-11 DIAGNOSIS — M542 Cervicalgia: Secondary | ICD-10-CM | POA: Diagnosis not present

## 2022-05-22 ENCOUNTER — Ambulatory Visit
Admission: EM | Admit: 2022-05-22 | Discharge: 2022-05-22 | Disposition: A | Discharge status: Home / Self Care | Payer: Medicare HMO | Attending: Emergency Medicine | Admitting: Emergency Medicine

## 2022-05-22 DIAGNOSIS — G43909 Migraine, unspecified, not intractable, without status migrainosus: Secondary | ICD-10-CM

## 2022-05-22 MED ORDER — ONDANSETRON 4 MG PO TBDP
4.0000 mg | ORAL_TABLET | Freq: Once | ORAL | Status: AC
  Administered 2022-05-22: 4 mg via ORAL

## 2022-05-22 MED ORDER — KETOROLAC TROMETHAMINE 30 MG/ML IJ SOLN
30.0000 mg | Freq: Once | INTRAMUSCULAR | Status: AC
  Administered 2022-05-22: 30 mg via INTRAMUSCULAR

## 2022-05-22 MED ORDER — DIPHENHYDRAMINE HCL 25 MG PO CAPS
25.0000 mg | ORAL_CAPSULE | Freq: Once | ORAL | Status: AC
  Administered 2022-05-22: 25 mg via ORAL

## 2022-05-22 NOTE — ED Triage Notes (Signed)
Patient to Urgent Care with complaints of migraine that started last night. Hx of migraines. Reports she is usually able to sleep them of but today migraine is worsening.   Reports audio/ light sensitive / aura/ nausea/ headache.

## 2022-05-22 NOTE — Discharge Instructions (Addendum)
You were given an injection of Toradol today along with oral Benadryl and Zofran.    Go to the emergency department if your symptoms are not improving.    Follow-up with your primary care provider on Monday.

## 2022-05-22 NOTE — ED Provider Notes (Signed)
Jordan Ortega    CSN: [id number] Arrival date & time: 05/22/22  1412      History   Chief Complaint Chief Complaint  Patient presents with   Headache    HPI Jordan Ortega is a 67 y.o. female.  Patient presents with migraine headache since yesterday evening.  No falls or head injury.  This is her typical migraine and not the worst headache of her life.  She has associated light sensitivity, sound sensitivity, nausea.  No numbness, weakness, chest pain, shortness of breath, vomiting, or other symptoms.  Treatment attempted with Phenergan and Flexeril.  Her medical history includes migraine headaches.  The history is provided by the patient and medical records.    Past Medical History:  Diagnosis Date   Actinic keratosis    Adnexal mass    Anxiety    Barrett's esophagus    Bell palsy    left foot also ,    Bronchitis    Complication of anesthesia    difficulty waking up   COPD (chronic obstructive pulmonary disease)    Degenerative disc disease, cervical    Depression    Dysplastic nevus 06/08/2019   Right lower abdomen, moderate atypia, limited margins free.   Dysplastic nevus 12/06/2018   right lower abdomen/moderate, limited margins free.   Environmental and seasonal allergies    GERD (gastroesophageal reflux disease)    Heart murmur    Hx   High triglycerides    Migraines    Migraines    Rapid heart rate     Patient Active Problem List   Diagnosis Date Noted   Age-related osteoporosis with current pathological fracture with routine healing 04/03/2021   Dysplastic nevus 06/08/2019   Abdominal pain 10/16/2018   Abnormal CT scan, colon 10/16/2018   Personal history of tobacco use, presenting hazards to health 10/03/2018   Arm pain 12/05/2017   Centrilobular emphysema 12/04/2017   SOB (shortness of breath) 03/21/2017   Premature ventricular contractions 10/21/2016   Heart palpitations 10/05/2016   Cyst of right ovary 08/18/2016   Adnexal mass  08/17/2016   Bilateral carpal tunnel syndrome 04/08/2016   Bilateral hand pain 04/08/2016   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   Acute onset aura migraine 07/10/2014   Bad memory 07/10/2014   Migraine without aura 07/10/2014   Abdominal pain, epigastric 07/10/2014   Chronic constipation 07/10/2014   Weakness of left side of body 11/28/2011   Acute anxiety 11/20/2009   Anxiety and depression 11/20/2009   Edema of extremities 11/20/2009   Hypercholesterolemia without hypertriglyceridemia 06/18/2009   Depletion of volume of extracellular fluid 06/16/2009   Malaise and fatigue 10/29/2008   Weight loss, unintentional 10/29/2008   Mixed hyperlipidemia 08/22/2008   Adaptive colitis 05/17/2008   Affective bipolar disorder 05/17/2008   Barrett esophagus 05/17/2008    Past Surgical History:  Procedure Laterality Date   ABDOMINAL HYSTERECTOMY     APPENDECTOMY     CARDIAC CATHETERIZATION     [facility] pt doesn't have any stents or recall years ago 20+ yrs ago   CHOLECYSTECTOMY     COLONOSCOPY WITH PROPOFOL N/A 07/16/2016   Procedure: COLONOSCOPY WITH PROPOFOL;  Surgeon: Wyline Mood, MD;  Location: [facility] ENDOSCOPY;  Service: Endoscopy;  Laterality: N/A;   COLONOSCOPY WITH PROPOFOL N/A 10/20/2018   Procedure: COLONOSCOPY WITH PROPOFOL;  Surgeon: Wyline Mood, MD;  Location: [facility] ENDOSCOPY;  Service: Gastroenterology;  Laterality: N/A;   COLONOSCOPY WITH PROPOFOL N/A 07/29/2021   Procedure: COLONOSCOPY WITH PROPOFOL;  Surgeon: Wyline Mood,  MD;  Location: [facility] ENDOSCOPY;  Service: Gastroenterology;  Laterality: N/A;   DIAGNOSTIC LAPAROSCOPY     ESOPHAGOGASTRODUODENOSCOPY N/A 07/29/2021   Procedure: ESOPHAGOGASTRODUODENOSCOPY (EGD);  Surgeon: Wyline Mood, MD;  Location: [facility] ENDOSCOPY;  Service: Gastroenterology;  Laterality: N/A;   ESOPHAGOGASTRODUODENOSCOPY (EGD) WITH PROPOFOL N/A 07/16/2016   Procedure: ESOPHAGOGASTRODUODENOSCOPY (EGD) WITH PROPOFOL;  Surgeon: Wyline Mood, MD;  Location: [facility] ENDOSCOPY;   Service: Endoscopy;  Laterality: N/A;   ESOPHAGOGASTRODUODENOSCOPY (EGD) WITH PROPOFOL N/A 12/05/2018   Procedure: ESOPHAGOGASTRODUODENOSCOPY (EGD) WITH PROPOFOL;  Surgeon: Wyline Mood, MD;  Location: [facility] ENDOSCOPY;  Service: Gastroenterology;  Laterality: N/A;   GALLBLADDER SURGERY  1980   GIVENS CAPSULE STUDY N/A 12/21/2018   Procedure: GIVENS CAPSULE STUDY;  Surgeon: Wyline Mood, MD;  Location: [facility] ENDOSCOPY;  Service: Gastroenterology;  Laterality: N/A;   LAPAROSCOPIC SALPINGO OOPHERECTOMY Bilateral 08/27/2016   Procedure: LAPAROSCOPIC SALPINGO OOPHORECTOMY;  Surgeon: Nadara Mustard, MD;  Location: [facility] ORS;  Service: Gynecology;  Laterality: Bilateral;   TUBAL LIGATION  1983    OB History     Gravida  5   Para  3   Term  3   Preterm      AB  2   Living  3      SAB      IAB  2   Ectopic      Multiple      Live Births               Home Medications    Prior to Admission medications   Medication Sig Start Date End Date Taking? Authorizing Provider  AIMOVIG 140 MG/ML SOAJ Inject into the skin. 04/01/21   [provider]  albuterol (PROVENTIL HFA;VENTOLIN HFA) 108 (90 Base) MCG/ACT inhaler Inhale 2 puffs into the lungs every 6 (six) hours as needed for wheezing or shortness of breath. 11/01/17   Chrismon, Jodell Cipro, PA-C  ALPRAZolam (XANAX) 0.5 MG tablet Take 1 tablet (0.5 mg total) by mouth 3 (three) times daily as needed for anxiety. 11/19/16   Chrismon, Jodell Cipro, PA-C  baclofen (LIORESAL) 10 MG tablet Take 1 tablet (10 mg total) by mouth 3 (three) times daily. 04/21/20   Margaretann Loveless, PA-C  budesonide-formoterol (SYMBICORT) 160-4.5 MCG/ACT inhaler Inhale 1 puff into the lungs 2 (two) times daily. 03/21/17   Chrismon, Jodell Cipro, PA-C  butalbital-acetaminophen-caffeine (FIORICET) 50-325-40 MG tablet Take 1 tablet by mouth daily as needed. 12/15/21   [provider]  calcium carbonate (CALCIUM 600) 600 MG TABS tablet Take 600 mg by mouth 2  (two) times daily with a meal.    [provider]  CHOLECALCIFEROL PO Take by mouth. 1000 units    [provider]  clobetasol ointment (TEMOVATE) 0.05 % Apply 1 Application topically 2 (two) times daily. To affected area rash. Avoid applying to face, groin, and axilla. Use as directed. Long-term use can cause thinning of the skin. 09/10/21   Moye, [place], MD  cyanocobalamin 1000 MCG tablet Take 1,000 mcg by mouth daily.    [provider]  cyclobenzaprine (FLEXERIL) 5 MG tablet Take 1 tablet (5 mg total) by mouth 3 (three) times daily as needed for muscle spasms. Patient taking differently: Take 5 mg by mouth 3 (three) times daily as needed for muscle spasms. 10mg  04/21/20   Margaretann Loveless, PA-C  dicyclomine (BENTYL) 20 MG tablet Take 1 tablet by mouth every 6 (six) hours. 06/04/21 06/04/22  [provider]  diphenhydrAMINE (BENADRYL) 50 MG capsule 1 capsule 1  hour prior to [facility] 05/01/21   [provider]  Fluticasone-Umeclidin-Vilant (TRELEGY ELLIPTA) 100-62.5-25 MCG/ACT AEPB Inhale into the lungs.    [provider]  HYDROcodone-acetaminophen (NORCO/VICODIN) 5-325 MG tablet Take 1 tablet by mouth every 4 (four) hours as needed for moderate pain or severe pain. 04/21/20   Margaretann Loveless, PA-C  HYDROCODONE-CHLORPHENIRAMINE PO Take by mouth.    [provider]  loratadine (CLARITIN) 10 MG tablet Take by mouth.    [provider]  magnesium gluconate (MAGONATE) 500 MG tablet Take by mouth.    [provider]  meloxicam (MOBIC) 15 MG tablet Take 1 tablet by mouth daily. 03/25/21   [provider]  METAMUCIL FIBER PO Take by mouth.    [provider]  mirtazapine (REMERON) 30 MG tablet Take 30 mg by mouth at bedtime. 04/30/21   [provider]  Multiple Vitamin (MULTIVITAMIN) tablet Take 1 tablet by mouth daily.    [provider]  Multiple Vitamins-Minerals (HAIR SKIN & NAILS PO)  Take by mouth.    [provider]  omeprazole (PRILOSEC) 40 MG capsule Take 1 capsule (40 mg total) by mouth in the morning and at bedtime. 07/30/21   Wyline Mood, MD  Pantoprazole Sodium (PROTONIX PO) Take 40 mg by mouth.    [provider]  promethazine (PHENERGAN) 25 MG tablet Take 1 tablet (25 mg total) by mouth every 8 (eight) hours as needed for nausea or vomiting. 10/17/18   Margaretann Loveless, PA-C  simvastatin (ZOCOR) 20 MG tablet TAKE ONE TABLET BY MOUTH AT BEDTIME 02/08/20   Margaretann Loveless, PA-C  sucralfate (CARAFATE) 1 g tablet TAKE 1 TABLET BY MOUTH 4 TIMES DAILY 08/26/21   Wyline Mood, MD  traZODone (DESYREL) 50 MG tablet Take 50 mg by mouth at bedtime.    [provider]  Ubrogepant (UBRELVY) 100 MG TABS Take by mouth.    [provider]    Family History Family History  Problem Relation Age of Onset   Breast cancer Mother 33   Skin cancer Mother    Hypothyroidism Mother    Parkinson's disease Mother    Osteoporosis Mother    Colon cancer Father    Lung cancer Father    Heart disease Father    Migraines Sister    Breast cancer Maternal Aunt        post men.   Diabetes Paternal Uncle    Diabetes Paternal Grandmother    Hypertension Paternal Grandmother    Stroke Paternal Grandmother    Dementia Paternal Grandfather    Breast cancer Cousin 87       maternal    Multiple myeloma Cousin     Social History Social History   Tobacco Use   Smoking status: Every Day    Packs/day: 1.00    Years: 46.00    Additional pack years: 0.00    Total pack years: 46.00    Types: Cigarettes   Smokeless tobacco: Never   Tobacco comments:    .5ppd currently    04/06/2021- Patient states she has cut back to 1 pack a day   Vaping Use   Vaping Use: Never used  Substance Use Topics   Alcohol use: Not Currently    Alcohol/week: 0.0 standard drinks of alcohol   Drug use: No     Allergies   Diazepam, Ciprofloxacin, Codeine, Dexamethasone,  Metoprolol, Morphine, Niacin and related, Topiramate, Toradol [ketorolac tromethamine], and Doxycycline   Review of Systems Review of  Systems  Constitutional:  Negative for chills and fever.  Eyes:  Positive for photophobia. Negative for pain and visual disturbance.  Respiratory:  Negative for cough and shortness of breath.   Cardiovascular:  Negative for chest pain and palpitations.  Gastrointestinal:  Positive for nausea. Negative for abdominal pain and vomiting.  Neurological:  Positive for headaches. Negative for dizziness, syncope, facial asymmetry, speech difficulty, weakness, light-headedness and numbness.     Physical Exam Triage Vital Signs ED Triage Vitals  Enc Vitals Group     BP 05/22/22 1520 124/83     Pulse Rate 05/22/22 1501 71     Resp 05/22/22 1501 18     Temp 05/22/22 1501 97.9 F (36.6 C)     Temp src --      SpO2 05/22/22 1501 98 %     Weight --      Height --      Head Circumference --      Peak Flow --      Pain Score 05/22/22 1518 10     Pain Loc --      Pain Edu? --      Excl. in GC? --    No data found.  Updated Vital Signs BP 124/83   Pulse 71   Temp 97.9 F (36.6 C)   Resp 18   SpO2 98%   Visual Acuity Right Eye Distance:   Left Eye Distance:   Bilateral Distance:    Right Eye Near:   Left Eye Near:    Bilateral Near:     Physical Exam Vitals and nursing note reviewed.  Constitutional:      General: She is not in acute distress.    Appearance: Normal appearance. She is well-developed. She is not ill-appearing.  HENT:     Head: Normocephalic and atraumatic.     Mouth/Throat:     Mouth: Mucous membranes are moist.  Eyes:     Pupils: Pupils are equal, round, and reactive to light.  Cardiovascular:     Rate and Rhythm: Normal rate and regular rhythm.     Heart sounds: Normal heart sounds.  Pulmonary:     Effort: Pulmonary effort is normal. No respiratory distress.     Breath sounds: Normal breath sounds.  Abdominal:      General: Bowel sounds are normal.     Palpations: Abdomen is soft.     Tenderness: There is no abdominal tenderness. There is no guarding or rebound.  Musculoskeletal:     Cervical back: Neck supple.  Skin:    General: Skin is warm and dry.  Neurological:     General: No focal deficit present.     Mental Status: She is alert and oriented to person, place, and time.     Sensory: No sensory deficit.     Motor: No weakness.     Gait: Gait normal.  Psychiatric:        Mood and Affect: Mood normal.        Behavior: Behavior normal.      UC Treatments / Results  Labs (all labs ordered are listed, but only abnormal results are displayed) Labs Reviewed - No data to display  EKG   Radiology No results found.  Procedures Procedures (including critical care time)  Medications Ordered in UC Medications  diphenhydrAMINE (BENADRYL) capsule 25 mg (has no administration in time range)  ketorolac (TORADOL) 30 MG/ML injection 30 mg (has no administration in time range)  ondansetron (ZOFRAN-ODT) disintegrating tablet  4 mg (4 mg Oral Given 05/22/22 1540)    Initial Impression / Assessment and Plan / UC Course  I have reviewed the triage vital signs and the nursing notes.  Pertinent labs & imaging results that were available during my care of the patient were reviewed by me and considered in my medical decision making (see chart for details).   Migraine headache.  Afebrile and vital signs are stable.  Exam is reassuring.  Patient has allergy listed to Toradol but states she is able to take it as long as she takes it with Benadryl.  She has not had any NSAIDs today and denies history of abnormal bleeding.  Per her request, Toradol given here with oral Benadryl and Zofran.  She is accompanied by her husband who will drive her home.  Instructed patient to follow-up with her PCP on Monday.  ED precautions given.  Education provided on migraine headache.  Patient agrees to plan of care.   Final  Clinical Impressions(s) / UC Diagnoses   Final diagnoses:  Migraine without status migrainosus, not intractable, unspecified migraine type     Discharge Instructions      You were given an injection of Toradol today along with oral Benadryl and Zofran.    Go to the emergency department if your symptoms are not improving.    Follow-up with your primary care provider on Monday.     ED Prescriptions   None    I have reviewed the PDMP during this encounter.   Mickie Bail, NP 05/22/22 [phone]

## 2022-05-25 DIAGNOSIS — G518 Other disorders of facial nerve: Secondary | ICD-10-CM | POA: Diagnosis not present

## 2022-05-25 DIAGNOSIS — M542 Cervicalgia: Secondary | ICD-10-CM | POA: Diagnosis not present

## 2022-05-25 DIAGNOSIS — G43719 Chronic migraine without aura, intractable, without status migrainosus: Secondary | ICD-10-CM | POA: Diagnosis not present

## 2022-05-25 DIAGNOSIS — M791 Myalgia, unspecified site: Secondary | ICD-10-CM | POA: Diagnosis not present

## 2022-06-14 DIAGNOSIS — G518 Other disorders of facial nerve: Secondary | ICD-10-CM | POA: Diagnosis not present

## 2022-06-14 DIAGNOSIS — G43719 Chronic migraine without aura, intractable, without status migrainosus: Secondary | ICD-10-CM | POA: Diagnosis not present

## 2022-06-14 DIAGNOSIS — M791 Myalgia, unspecified site: Secondary | ICD-10-CM | POA: Diagnosis not present

## 2022-06-14 DIAGNOSIS — M542 Cervicalgia: Secondary | ICD-10-CM | POA: Diagnosis not present

## 2022-07-12 DIAGNOSIS — G43719 Chronic migraine without aura, intractable, without status migrainosus: Secondary | ICD-10-CM | POA: Diagnosis not present

## 2022-07-12 DIAGNOSIS — M791 Myalgia, unspecified site: Secondary | ICD-10-CM | POA: Diagnosis not present

## 2022-07-12 DIAGNOSIS — M542 Cervicalgia: Secondary | ICD-10-CM | POA: Diagnosis not present

## 2022-07-12 DIAGNOSIS — G518 Other disorders of facial nerve: Secondary | ICD-10-CM | POA: Diagnosis not present

## 2022-08-10 DIAGNOSIS — Z79899 Other long term (current) drug therapy: Secondary | ICD-10-CM | POA: Diagnosis not present

## 2022-08-10 DIAGNOSIS — K22719 Barrett's esophagus with dysplasia, unspecified: Secondary | ICD-10-CM | POA: Diagnosis not present

## 2022-08-10 DIAGNOSIS — G43019 Migraine without aura, intractable, without status migrainosus: Secondary | ICD-10-CM | POA: Diagnosis not present

## 2022-08-10 DIAGNOSIS — Z0000 Encounter for general adult medical examination without abnormal findings: Secondary | ICD-10-CM | POA: Diagnosis not present

## 2022-08-10 DIAGNOSIS — R079 Chest pain, unspecified: Secondary | ICD-10-CM | POA: Diagnosis not present

## 2022-08-10 DIAGNOSIS — Z23 Encounter for immunization: Secondary | ICD-10-CM | POA: Diagnosis not present

## 2022-08-10 DIAGNOSIS — Z1159 Encounter for screening for other viral diseases: Secondary | ICD-10-CM | POA: Diagnosis not present

## 2022-08-11 DIAGNOSIS — G43719 Chronic migraine without aura, intractable, without status migrainosus: Secondary | ICD-10-CM | POA: Diagnosis not present

## 2022-08-11 DIAGNOSIS — M542 Cervicalgia: Secondary | ICD-10-CM | POA: Diagnosis not present

## 2022-08-11 DIAGNOSIS — G518 Other disorders of facial nerve: Secondary | ICD-10-CM | POA: Diagnosis not present

## 2022-08-11 DIAGNOSIS — M791 Myalgia, unspecified site: Secondary | ICD-10-CM | POA: Diagnosis not present

## 2022-08-13 ENCOUNTER — Other Ambulatory Visit: Payer: Self-pay | Admitting: Internal Medicine

## 2022-08-13 DIAGNOSIS — R0789 Other chest pain: Secondary | ICD-10-CM | POA: Diagnosis not present

## 2022-08-13 DIAGNOSIS — I2089 Other forms of angina pectoris: Secondary | ICD-10-CM

## 2022-08-13 DIAGNOSIS — Z0000 Encounter for general adult medical examination without abnormal findings: Secondary | ICD-10-CM

## 2022-08-19 ENCOUNTER — Ambulatory Visit
Admission: RE | Admit: 2022-08-19 | Discharge: 2022-08-19 | Disposition: A | Discharge status: Home / Self Care | Payer: Self-pay | Source: Ambulatory Visit | Attending: Internal Medicine | Admitting: Internal Medicine

## 2022-08-19 DIAGNOSIS — Z0000 Encounter for general adult medical examination without abnormal findings: Secondary | ICD-10-CM | POA: Insufficient documentation

## 2022-08-19 DIAGNOSIS — I2089 Other forms of angina pectoris: Secondary | ICD-10-CM | POA: Insufficient documentation

## 2022-08-26 DIAGNOSIS — I5020 Unspecified systolic (congestive) heart failure: Secondary | ICD-10-CM | POA: Diagnosis not present

## 2022-08-26 DIAGNOSIS — R002 Palpitations: Secondary | ICD-10-CM | POA: Diagnosis not present

## 2022-08-26 DIAGNOSIS — E782 Mixed hyperlipidemia: Secondary | ICD-10-CM | POA: Diagnosis not present

## 2022-09-02 DIAGNOSIS — R002 Palpitations: Secondary | ICD-10-CM | POA: Diagnosis not present

## 2022-09-08 DIAGNOSIS — R002 Palpitations: Secondary | ICD-10-CM | POA: Diagnosis not present

## 2022-09-08 DIAGNOSIS — I5020 Unspecified systolic (congestive) heart failure: Secondary | ICD-10-CM | POA: Diagnosis not present

## 2022-09-13 DIAGNOSIS — M810 Age-related osteoporosis without current pathological fracture: Secondary | ICD-10-CM | POA: Diagnosis not present

## 2022-09-14 DIAGNOSIS — G43719 Chronic migraine without aura, intractable, without status migrainosus: Secondary | ICD-10-CM | POA: Diagnosis not present

## 2022-09-14 DIAGNOSIS — M791 Myalgia, unspecified site: Secondary | ICD-10-CM | POA: Diagnosis not present

## 2022-09-14 DIAGNOSIS — M542 Cervicalgia: Secondary | ICD-10-CM | POA: Diagnosis not present

## 2022-09-14 DIAGNOSIS — G518 Other disorders of facial nerve: Secondary | ICD-10-CM | POA: Diagnosis not present

## 2022-09-15 ENCOUNTER — Encounter: Payer: 59 | Admitting: Dermatology

## 2022-09-20 DIAGNOSIS — R636 Underweight: Secondary | ICD-10-CM | POA: Diagnosis not present

## 2022-09-20 DIAGNOSIS — M810 Age-related osteoporosis without current pathological fracture: Secondary | ICD-10-CM | POA: Diagnosis not present

## 2022-09-20 DIAGNOSIS — F17200 Nicotine dependence, unspecified, uncomplicated: Secondary | ICD-10-CM | POA: Diagnosis not present

## 2022-09-21 ENCOUNTER — Encounter: Payer: Self-pay | Admitting: Dermatology

## 2022-09-21 ENCOUNTER — Ambulatory Visit (INDEPENDENT_AMBULATORY_CARE_PROVIDER_SITE_OTHER): Payer: Medicare HMO | Admitting: Dermatology

## 2022-09-21 VITALS — BP 96/63 | HR 83

## 2022-09-21 DIAGNOSIS — L821 Other seborrheic keratosis: Secondary | ICD-10-CM

## 2022-09-21 DIAGNOSIS — L309 Dermatitis, unspecified: Secondary | ICD-10-CM | POA: Diagnosis not present

## 2022-09-21 DIAGNOSIS — L578 Other skin changes due to chronic exposure to nonionizing radiation: Secondary | ICD-10-CM

## 2022-09-21 DIAGNOSIS — W908XXA Exposure to other nonionizing radiation, initial encounter: Secondary | ICD-10-CM | POA: Diagnosis not present

## 2022-09-21 DIAGNOSIS — Z872 Personal history of diseases of the skin and subcutaneous tissue: Secondary | ICD-10-CM

## 2022-09-21 DIAGNOSIS — D229 Melanocytic nevi, unspecified: Secondary | ICD-10-CM

## 2022-09-21 DIAGNOSIS — Z86018 Personal history of other benign neoplasm: Secondary | ICD-10-CM | POA: Diagnosis not present

## 2022-09-21 DIAGNOSIS — L650 Telogen effluvium: Secondary | ICD-10-CM | POA: Diagnosis not present

## 2022-09-21 DIAGNOSIS — L814 Other melanin hyperpigmentation: Secondary | ICD-10-CM | POA: Diagnosis not present

## 2022-09-21 DIAGNOSIS — Z1283 Encounter for screening for malignant neoplasm of skin: Secondary | ICD-10-CM | POA: Diagnosis not present

## 2022-09-21 DIAGNOSIS — D1801 Hemangioma of skin and subcutaneous tissue: Secondary | ICD-10-CM | POA: Diagnosis not present

## 2022-09-21 NOTE — Progress Notes (Signed)
Follow-Up Visit   Subjective  Jordan Ortega is a 67 y.o. female who presents for the following: Skin Cancer Screening and Full Body Skin Exam  The patient presents for Total-Body Skin Exam (TBSE) for skin cancer screening and mole check. The patient has spots, moles and lesions to be evaluated, some may be new or changing. She has a spot on her right thumb, diagnosed as hand dermatitis in the past and improves with clobetasol ointment, but never completely clears. Itchy when flares. She has hair loss that started 2-3 months ago. Scalp not itchy. She hasn't had any illness or surgery. She was tapered off Gabapentin in March, which made her unable to sleep, unable to eat, had a 10 lb weight loss, and shaking. She is also avoiding caffeine. Patient was taken off all of her migraine medicines, as well. She has a history of dysplastic nevi of the right lower abdomen and history of AKs.  Had recent echo, left ventricle is only operating at 45%, no hypertrophy. She is following up with cardiologist.    The following portions of the chart were reviewed this encounter and updated as appropriate: medications, allergies, medical history  Review of Systems:  No other skin or systemic complaints except as noted in HPI or Assessment and Plan.  Objective  Well appearing patient in no apparent distress; mood and affect are within normal limits.  A full examination was performed including scalp, head, eyes, ears, nose, lips, neck, chest, axillae, abdomen, back, buttocks, bilateral upper extremities, bilateral lower extremities, hands, feet, fingers, toes, fingernails, and toenails. All findings within normal limits unless otherwise noted below.   Relevant physical exam findings are noted in the Assessment and Plan.    Assessment & Plan   SKIN CANCER SCREENING PERFORMED TODAY.  ACTINIC DAMAGE - Chronic condition, secondary to cumulative UV/sun exposure - diffuse scaly erythematous macules with  underlying dyspigmentation - Recommend daily broad spectrum sunscreen SPF 30+ to sun-exposed areas, reapply every 2 hours as needed.  - Staying in the shade or wearing long sleeves, sun glasses (UVA+UVB protection) and wide brim hats (4-inch brim around the entire circumference of the hat) are also recommended for sun protection.  - Call for new or changing lesions.  LENTIGINES, SEBORRHEIC KERATOSES, HEMANGIOMAS - Benign normal skin lesions - Benign-appearing - Call for any changes  MELANOCYTIC NEVI - Tan-brown and/or pink-flesh-colored symmetric macules and papules - Benign appearing on exam today - Observation - Call clinic for new or changing moles - Recommend daily use of broad spectrum spf 30+ sunscreen to sun-exposed areas.   History of Dysplastic Nevi - No evidence of recurrence today - Recommend regular full body skin exams - Recommend daily broad spectrum sunscreen SPF 30+ to sun-exposed areas, reapply every 2 hours as needed.  - Call if any new or changing lesions are noted between office visits  TELOGEN EFFLUVIUM Secondary to change in diet, weight loss, stress.  Exam: Diffuse relative alopecia of scalp, no scaling crusting or focal lesions Telogen effluvium is a benign, self-limited condition causing increased hair shedding usually for several months. It does not progress to baldness, and the hair eventually grows back on its own. It can be triggered by recent illness, recent surgery, thyroid disease, low iron stores, vitamin D deficiency, fad diets or rapid weight loss, hormonal changes such as pregnancy or birth control pills, and some medication. Usually the hair loss starts 2-3 months after the illness or health change.   Treatment Plan: TSH, CBC from  08/10/2022 reviewed and wnl. Discussed adding Ferritin, Vitamin D, T4. Patient defers today. She is feeling better and back to her regular diet.  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 Condition is symptomatic/ bothersome to patient. Not currently at goal.  Hand Dermatitis is a chronic type of eczema that can come and go on the hands and fingers.  While there is no cure, the rash and symptoms can be managed with topical prescription medications, and for more severe cases, with systemic medications.  Recommend mild soap and routine use of moisturizing cream after handwashing.  Minimize soap/water exposure when possible.    Treatment Plan Continue clobetasol ointment 1-2 times a day as needed for flares. Avoid applying to face, groin, and axilla. Use as directed. Long-term use can cause thinning of the skin. Patient has at home and will call if refills needed.   Samples of CeraVe SA Cream given to patient.   Patient to RTC when condition is flared. Can be overbooked for biopsy  Return in about 1 year (around 09/21/2023) for TBSE.  Wendee Beavers, CMA, am acting as scribe for Elie Goody, MD .   Documentation: I have reviewed the above documentation for accuracy and completeness, and I agree with the above.  Elie Goody, MD

## 2022-09-21 NOTE — Patient Instructions (Addendum)
Telogen effluvium is a benign, self-limited condition causing increased hair shedding usually for several months. It does not progress to baldness, and the hair eventually grows back on its own. It can be triggered by recent illness, recent surgery, thyroid disease, low iron stores, vitamin D deficiency, fad diets or rapid weight loss, hormonal changes such as pregnancy or birth control pills, and some medication. Usually the hair loss starts 2-3 months after the illness or health change. Rarely, it can continue for longer than a year.  Due to recent changes in healthcare laws, you may see results of your pathology and/or laboratory studies on MyChart before the doctors have had a chance to review them. We understand that in some cases there may be results that are confusing or concerning to you. Please understand that not all results are received at the same time and often the doctors may need to interpret multiple results in order to provide you with the best plan of care or course of treatment. Therefore, we ask that you please give [place] 2 business days to thoroughly review all your results before contacting the office for clarification. Should we see a critical lab result, you will be contacted sooner.   If You Need Anything After Your Visit  If you have any questions or concerns for your doctor, please call our main line at [phone] and press option 4 to reach your doctor's medical assistant. If no one answers, please leave a voicemail as directed and we will return your call as soon as possible. Messages left after 4 pm will be answered the following business day.   You may also send [place] a message via MyChart. We typically respond to MyChart messages within 1-2 business days.  For prescription refills, please ask your pharmacy to contact our office. Our fax number is [phone].  If you have an urgent issue when the clinic is closed that cannot wait until the next business day, you can page  your doctor at the number below.    Please note that while we do our best to be available for urgent issues outside of office hours, we are not available 24/7.   If you have an urgent issue and are unable to reach [place], you may choose to seek medical care at your doctor's office, retail clinic, urgent care center, or emergency room.  If you have a medical emergency, please immediately call 911 or go to the emergency department.  Pager Numbers  - Dr. Gwen Pounds: [phone]  - Dr. Roseanne Reno: [phone]  - Dr. Katrinka Blazing: [phone]   In the event of inclement weather, please call our main line at [phone] for an update on the status of any delays or closures.  Dermatology Medication Tips: Please keep the boxes that topical medications come in in order to help keep track of the instructions about where and how to use these. Pharmacies typically print the medication instructions only on the boxes and not directly on the medication tubes.   If your medication is too expensive, please contact our office at [phone] option 4 or send [place] a message through MyChart.   We are unable to tell what your co-pay for medications will be in advance as this is different depending on your insurance coverage. However, we may be able to find a substitute medication at lower cost or fill out paperwork to get insurance to cover a needed medication.   If a prior authorization is required to get your medication covered by your insurance company, please  allow [place] 1-2 business days to complete this process.  Drug prices often vary depending on where the prescription is filled and some pharmacies may offer cheaper prices.  The website www.goodrx.com contains coupons for medications through different pharmacies. The prices here do not account for what the cost may be with help from insurance (it may be cheaper with your insurance), but the website can give you the price if you did not use any insurance.  - You can  print the associated coupon and take it with your prescription to the pharmacy.  - You may also stop by our office during regular business hours and pick up a GoodRx coupon card.  - If you need your prescription sent electronically to a different pharmacy, notify our office through [facility] or by phone at [phone] option 4.     Si Usted Necesita Algo Despus de Su Visita  Tambin puede enviarnos un mensaje a travs de Clinical cytogeneticist. Por lo general respondemos a los mensajes de MyChart en el transcurso de 1 a 2 das hbiles.  Para renovar recetas, por favor pida a su farmacia que se ponga en contacto con nuestra oficina. Annie Sable de fax es [place] [phone].  Si tiene un asunto urgente cuando la clnica est cerrada y que no puede esperar hasta el siguiente da hbil, puede llamar/localizar a su doctor(a) al nmero que aparece a continuacin.   Por favor, tenga en cuenta que aunque hacemos todo lo posible para estar disponibles para asuntos urgentes fuera del horario de [place], no estamos disponibles las 24 horas del da, los 7 [address]  [address] de la [place].   Si tiene un problema urgente y no puede comunicarse con nosotros, puede optar por buscar atencin mdica  en el consultorio de su doctor(a), en una clnica privada, en un centro de atencin urgente o en una sala de emergencias.  Si tiene Engineer, drilling, por favor llame inmediatamente al 911 o vaya a la sala de emergencias.  Nmeros de bper  - Dr. Gwen Pounds: [phone]  - Dra. Roseanne Reno: [phone]  - Dr. Katrinka Blazing: [phone]   En caso de inclemencias del tiempo, por favor llame a Lacy Duverney principal al [phone] para una actualizacin sobre el [place] de cualquier retraso o cierre.  Consejos para la medicacin en dermatologa: Por favor, guarde las cajas en las que vienen los medicamentos de uso tpico para ayudarle a seguir las instrucciones sobre dnde y cmo usarlos. Las farmacias generalmente imprimen las  instrucciones del medicamento slo en las cajas y no directamente en los tubos del [place].   Si su medicamento es muy caro, por favor, pngase en contacto con Rolm Gala llamando al [phone] y presione la opcin 4 o envenos un mensaje a travs de Clinical cytogeneticist.   No podemos decirle cul ser su copago por los medicamentos por adelantado ya que esto es diferente dependiendo de la cobertura de su seguro. Sin embargo, es posible que podamos encontrar un medicamento sustituto a Audiological scientist un formulario para que el seguro cubra el medicamento que se considera necesario.   Si se requiere una autorizacin previa para que su compaa de seguros [place] su medicamento, por favor permtanos de 1 a 2 das hbiles para completar [address]  Los precios de los medicamentos varan con frecuencia dependiendo del Environmental consultant de dnde se surte la receta y alguna farmacias pueden ofrecer precios ms baratos.  El sitio web www.goodrx.com tiene cupones para medicamentos de Health and safety inspector. Los precios aqu no tienen en cuenta lo  que podra costar con la ayuda del seguro (puede ser ms barato con su seguro), pero el sitio web puede darle el precio si no Visual merchandiser.  - Puede imprimir el cupn correspondiente y llevarlo con su receta a la farmacia.  - Tambin puede pasar por nuestra oficina durante el horario de atencin regular y Education officer, museum una tarjeta de cupones de GoodRx.  - Si necesita que su receta se enve electrnicamente a una farmacia diferente, informe a nuestra oficina a travs de MyChart de [place] o por telfono llamando al [phone] y presione la opcin 4.

## 2022-09-27 ENCOUNTER — Ambulatory Visit
Admission: EM | Admit: 2022-09-27 | Discharge: 2022-09-27 | Disposition: A | Discharge status: Home / Self Care | Payer: Medicare HMO | Attending: Emergency Medicine | Admitting: Emergency Medicine

## 2022-09-27 DIAGNOSIS — G43909 Migraine, unspecified, not intractable, without status migrainosus: Secondary | ICD-10-CM | POA: Diagnosis not present

## 2022-09-27 DIAGNOSIS — R002 Palpitations: Secondary | ICD-10-CM | POA: Diagnosis not present

## 2022-09-27 MED ORDER — KETOROLAC TROMETHAMINE 30 MG/ML IJ SOLN
30.0000 mg | Freq: Once | INTRAMUSCULAR | Status: AC
  Administered 2022-09-27: 30 mg via INTRAMUSCULAR

## 2022-09-27 MED ORDER — DIPHENHYDRAMINE HCL 25 MG PO CAPS
25.0000 mg | ORAL_CAPSULE | Freq: Once | ORAL | Status: AC
  Administered 2022-09-27: 25 mg via ORAL

## 2022-09-27 MED ORDER — ONDANSETRON 4 MG PO TBDP
4.0000 mg | ORAL_TABLET | Freq: Once | ORAL | Status: AC
  Administered 2022-09-27: 4 mg via ORAL

## 2022-09-27 NOTE — ED Provider Notes (Signed)
Jordan Ortega    CSN: [id number] Arrival date & time: 09/27/22  1519      History   Chief Complaint Chief Complaint  Patient presents with   Headache    HPI Jordan Ortega is a 67 y.o. female.   Patient presents for evaluation of a left-sided frontal headache present for 2 days.  Headache is constant described as a pounding sensation with associated light and noise sensitivity, floaters and nausea without vomiting.  Dizziness only experience when changing positions.  Has attempted use of Phenergan and hydrocodone which has been ineffective.  Follow-up but the headache management clinic, endorses she is seeing great improvement with migraine management and has only had 2 migraines since beginning treatment.  Denies head injury or trauma.  Past Medical History:  Diagnosis Date   Actinic keratosis    Adnexal mass    Anxiety    Barrett's esophagus    Bell palsy    left foot also ,    Bronchitis    Complication of anesthesia    difficulty waking up   COPD (chronic obstructive pulmonary disease) (HCC)    Degenerative disc disease, cervical    Depression    Dysplastic nevus 06/08/2019   Right lower abdomen, moderate atypia, limited margins free.   Dysplastic nevus 12/06/2018   right lower abdomen/moderate, limited margins free.   Environmental and seasonal allergies    GERD (gastroesophageal reflux disease)    Heart murmur    Hx   High triglycerides    Migraines    Migraines    Rapid heart rate     Patient Active Problem List   Diagnosis Date Noted   Age-related osteoporosis with current pathological fracture with routine healing 04/03/2021   Dysplastic nevus 06/08/2019   Abdominal pain 10/16/2018   Abnormal CT scan, colon 10/16/2018   Personal history of tobacco use, presenting hazards to health 10/03/2018   Arm pain 12/05/2017   Centrilobular emphysema (HCC) 12/04/2017   SOB (shortness of breath) 03/21/2017   Premature ventricular contractions  10/21/2016   Heart palpitations 10/05/2016   Cyst of right ovary 08/18/2016   Adnexal mass 08/17/2016   Bilateral carpal tunnel syndrome 04/08/2016   Bilateral hand pain 04/08/2016   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   Acute onset aura migraine 07/10/2014   Bad memory 07/10/2014   Migraine without aura 07/10/2014   Abdominal pain, epigastric 07/10/2014   Chronic constipation 07/10/2014   Weakness of left side of body 11/28/2011   Acute anxiety 11/20/2009   Anxiety and depression 11/20/2009   Edema of extremities 11/20/2009   Hypercholesterolemia without hypertriglyceridemia 06/18/2009   Depletion of volume of extracellular fluid 06/16/2009   Malaise and fatigue 10/29/2008   Weight loss, unintentional 10/29/2008   Mixed hyperlipidemia 08/22/2008   Adaptive colitis 05/17/2008   Affective bipolar disorder (HCC) 05/17/2008   Barrett esophagus 05/17/2008    Past Surgical History:  Procedure Laterality Date   ABDOMINAL HYSTERECTOMY     APPENDECTOMY     CARDIAC CATHETERIZATION     [facility] pt doesn't have any stents or recall years ago 20+ yrs ago   CHOLECYSTECTOMY     COLONOSCOPY WITH PROPOFOL N/A 07/16/2016   Procedure: COLONOSCOPY WITH PROPOFOL;  Surgeon: Wyline Mood, MD;  Location[facility]COPY;  Service: Endoscopy;  Laterality: N/A;   COLONOSCOPY WITH PROPOFOL N/A 10/20/2018   Procedure: COLONOSCOPY WITH PROPOFOL;  Surgeon: Wyline Mood, MD;  Location: [facility] ENDOSCOPY;  Service: Gastroenterology;  Laterality: N/A;   COLONOSCOPY WITH PROPOFOL N/A 07/29/2021  Procedure: COLONOSCOPY WITH PROPOFOL;  Surgeon: Wyline Mood, MD;  Location: [facility] ENDOSCOPY;  Service: Gastroenterology;  Laterality: N/A;   DIAGNOSTIC LAPAROSCOPY     ESOPHAGOGASTRODUODENOSCOPY N/A 07/29/2021   Procedure: ESOPHAGOGASTRODUODENOSCOPY (EGD);  Surgeon: Wyline Mood, MD;  Location: [facility] ENDOSCOPY;  Service: Gastroenterology;  Laterality: N/A;   ESOPHAGOGASTRODUODENOSCOPY (EGD) WITH PROPOFOL N/A 07/16/2016   Procedure:  ESOPHAGOGASTRODUODENOSCOPY (EGD) WITH PROPOFOL;  Surgeon: Wyline Mood, MD;  Location: [facility] ENDOSCOPY;  Service: Endoscopy;  Laterality: N/A;   ESOPHAGOGASTRODUODENOSCOPY (EGD) WITH PROPOFOL N/A 12/05/2018   Procedure: ESOPHAGOGASTRODUODENOSCOPY (EGD) WITH PROPOFOL;  Surgeon: Wyline Mood, MD;  Location: [facility] ENDOSCOPY;  Service: Gastroenterology;  Laterality: N/A;   GALLBLADDER SURGERY  1980   GIVENS CAPSULE STUDY N/A 12/21/2018   Procedure: GIVENS CAPSULE STUDY;  Surgeon: Wyline Mood, MD;  Location[facility] ENDOSCOPY;  Service: Gastroenterology;  Laterality: N/A;   LAPAROSCOPIC SALPINGO OOPHERECTOMY Bilateral 08/27/2016   Procedure: LAPAROSCOPIC SALPINGO OOPHORECTOMY;  Surgeon: Nadara Mustard, MD;  Location: [facility] ORS;  Service: Gynecology;  Laterality: Bilateral;   TUBAL LIGATION  1983    OB History     Gravida  5   Para  3   Term  3   Preterm      AB  2   Living  3      SAB      IAB  2   Ectopic      Multiple      Live Births               Home Medications    Prior to Admission medications   Medication Sig Start Date End Date Taking? Authorizing Provider  AIMOVIG 140 MG/ML SOAJ Inject into the skin. 04/01/21   [provider]  albuterol (PROVENTIL HFA;VENTOLIN HFA) 108 (90 Base) MCG/ACT inhaler Inhale 2 puffs into the lungs every 6 (six) hours as needed for wheezing or shortness of breath. 11/01/17   Chrismon, Jodell Cipro, PA-C  ALPRAZolam (XANAX) 0.5 MG tablet Take 1 tablet (0.5 mg total) by mouth 3 (three) times daily as needed for anxiety. 11/19/16   Chrismon, Jodell Cipro, PA-C  baclofen (LIORESAL) 10 MG tablet Take 1 tablet (10 mg total) by mouth 3 (three) times daily. 04/21/20   Margaretann Loveless, PA-C  budesonide-formoterol (SYMBICORT) 160-4.5 MCG/ACT inhaler Inhale 1 puff into the lungs 2 (two) times daily. 03/21/17   Chrismon, Jodell Cipro, PA-C  butalbital-acetaminophen-caffeine (FIORICET) 50-325-40 MG tablet Take 1 tablet by mouth daily as needed. 12/15/21    [provider]  calcium carbonate (CALCIUM 600) 600 MG TABS tablet Take 600 mg by mouth 2 (two) times daily with a meal.    [provider]  CHOLECALCIFEROL PO Take by mouth. 1000 units    [provider]  clobetasol ointment (TEMOVATE) 0.05 % Apply 1 Application topically 2 (two) times daily. To affected area rash. Avoid applying to face, groin, and axilla. Use as directed. Long-term use can cause thinning of the skin. 09/10/21   Moye, [place], MD  cyanocobalamin 1000 MCG tablet Take 1,000 mcg by mouth daily.    [provider]  cyclobenzaprine (FLEXERIL) 5 MG tablet Take 1 tablet (5 mg total) by mouth 3 (three) times daily as needed for muscle spasms. Patient taking differently: Take 5 mg by mouth 3 (three) times daily as needed for muscle spasms. 10mg  04/21/20   Margaretann Loveless, PA-C  dicyclomine (BENTYL) 20 MG tablet Take 1 tablet by mouth every 6 (six) hours. 06/04/21 06/04/22  [provider]  diphenhydrAMINE (BENADRYL) 50 MG capsule 1 capsule 1 hour prior to [facility] 05/01/21   [provider]  Fluticasone-Umeclidin-Vilant (TRELEGY ELLIPTA) 100-62.5-25 MCG/ACT AEPB Inhale into the lungs.    [provider]  HYDROcodone-acetaminophen (NORCO/VICODIN) 5-325 MG tablet Take 1 tablet by mouth every 4 (four) hours as needed for moderate pain or severe pain. 04/21/20   Margaretann Loveless, PA-C  HYDROCODONE-CHLORPHENIRAMINE PO Take by mouth.    [provider]  loratadine (CLARITIN) 10 MG tablet Take by mouth.    [provider]  magnesium gluconate (MAGONATE) 500 MG tablet Take by mouth.    [provider]  meloxicam (MOBIC) 15 MG tablet Take 1 tablet by mouth daily. 03/25/21   [provider]  METAMUCIL FIBER PO Take by mouth.    [provider]  mirtazapine (REMERON) 30 MG tablet Take 30 mg by mouth at bedtime. 04/30/21   [provider]  Multiple Vitamin (MULTIVITAMIN) tablet Take 1  tablet by mouth daily.    [provider]  Multiple Vitamins-Minerals (HAIR SKIN & NAILS PO) Take by mouth.    [provider]  omeprazole (PRILOSEC) 40 MG capsule Take 1 capsule (40 mg total) by mouth in the morning and at bedtime. 07/30/21   Wyline Mood, MD  Pantoprazole Sodium (PROTONIX PO) Take 40 mg by mouth.    [provider]  promethazine (PHENERGAN) 25 MG tablet Take 1 tablet (25 mg total) by mouth every 8 (eight) hours as needed for nausea or vomiting. 10/17/18   Margaretann Loveless, PA-C  simvastatin (ZOCOR) 20 MG tablet TAKE ONE TABLET BY MOUTH AT BEDTIME 02/08/20   Margaretann Loveless, PA-C  sucralfate (CARAFATE) 1 g tablet TAKE 1 TABLET BY MOUTH 4 TIMES DAILY 08/26/21   Wyline Mood, MD  traZODone (DESYREL) 50 MG tablet Take 50 mg by mouth at bedtime.    [provider]  Ubrogepant (UBRELVY) 100 MG TABS Take by mouth.    [provider]    Family History Family History  Problem Relation Age of Onset   Breast cancer Mother 62   Skin cancer Mother    Hypothyroidism Mother    Parkinson's disease Mother    Osteoporosis Mother    Colon cancer Father    Lung cancer Father    Heart disease Father    Migraines Sister    Breast cancer Maternal Aunt        post men.   Diabetes Paternal Uncle    Diabetes Paternal Grandmother    Hypertension Paternal Grandmother    Stroke Paternal Grandmother    Dementia Paternal Grandfather    Breast cancer Cousin 23       maternal    Multiple myeloma Cousin     Social History Social History   Tobacco Use   Smoking status: Every Day    Current packs/day: 1.00    Average packs/day: 1 pack/day for 46.0 years (46.0 ttl pk-yrs)    Types: Cigarettes   Smokeless tobacco: Never   Tobacco comments:    .5ppd currently    04/06/2021- Patient states she has cut back to 1 pack a day   Vaping Use   Vaping status: Never Used  Substance Use Topics   Alcohol use: Not Currently    Alcohol/week: 0.0 standard  drinks of alcohol   Drug use: No     Allergies   Diazepam, Ciprofloxacin, Codeine, Dexamethasone, Metoprolol, Morphine, Niacin and related, Topiramate, Toradol [ketorolac tromethamine], and Doxycycline   Review of  Systems Review of Systems  Constitutional: Negative.   HENT: Negative.    Respiratory: Negative.    Cardiovascular: Negative.   Neurological:  Positive for headaches. Negative for dizziness, tremors, seizures, syncope, facial asymmetry, speech difficulty, weakness, light-headedness and numbness.     Physical Exam Triage Vital Signs ED Triage Vitals [09/27/22 1532]  Encounter Vitals Group     BP 98/65     Systolic BP Percentile      Diastolic BP Percentile      Pulse Rate 73     Resp 16     Temp 98.9 F (37.2 C)     Temp Source Oral     SpO2 96 %     Weight      Height      Head Circumference      Peak Flow      Pain Score      Pain Loc      Pain Education      Exclude from Growth Chart    No data found.  Updated Vital Signs BP 98/65 (BP Location: Left Arm)   Pulse 73   Temp 98.9 F (37.2 C) (Oral)   Resp 16   SpO2 96%   Visual Acuity Right Eye Distance:   Left Eye Distance:   Bilateral Distance:    Right Eye Near:   Left Eye Near:    Bilateral Near:     Physical Exam Constitutional:      Appearance: Normal appearance.  Eyes:     Extraocular Movements: Extraocular movements intact.     Conjunctiva/sclera: Conjunctivae normal.     Pupils: Pupils are equal, round, and reactive to light.  Pulmonary:     Effort: Pulmonary effort is normal.  Neurological:     General: No focal deficit present.     Mental Status: She is alert and oriented to person, place, and time. Mental status is at baseline.     Cranial Nerves: No cranial nerve deficit.     Sensory: No sensory deficit.     Motor: No weakness.     Coordination: Coordination normal.     Gait: Gait normal.      UC Treatments / Results  Labs (all labs ordered are listed, but only  abnormal results are displayed) Labs Reviewed - No data to display  EKG   Radiology No results found.  Procedures Procedures (including critical care time)  Medications Ordered in UC Medications  ondansetron (ZOFRAN-ODT) disintegrating tablet 4 mg (4 mg Oral Given 09/27/22 1539)  ketorolac (TORADOL) 30 MG/ML injection 30 mg (30 mg Intramuscular Given 09/27/22 1536)  diphenhydrAMINE (BENADRYL) capsule 25 mg (25 mg Oral Given 09/27/22 1539)    Initial Impression / Assessment and Plan / UC Course  I have reviewed the triage vital signs and the nursing notes.  Pertinent labs & imaging results that were available during my care of the patient were reviewed by me and considered in my medical decision making (see chart for details).  Migraine without status migrainosus, non-intractable  Presentation of headache is consistent with baseline migraines, no neurological deficits on exam, vitals are stable, has had success with Toradol, Zofran and Benadryl in the past, given today in clinic, advised rest and low stimulation activities while headache is present, may continue use of Phenergan and hydrocodone at home as directed ,May follow-up with her doctors for further management, given strict precautions for the worst headache of her life ago to the nearest emergency department for treatment, verbalized understanding  Final Clinical Impressions(s) / UC Diagnoses   Final diagnoses:  Migraine without status migrainosus, not intractable, unspecified migraine type     Discharge Instructions      For your headache -On exam there are no abnormalities neurologically -You have been given an injection of Toradol, as Benadryl and Zofran here in the office today to help minimize your symptoms -You may continue use of your normal medications for management at home -While headaches are present ensure that you are getting adequate rest and adequate fluid intake -Participate in low stimulation activities  avoiding bright lights and loud noises when symptoms are present -If your headaches continue to persist please follow-up with your primary doctor for reevaluation -At any point if you have the worst headache of your life please go to the nearest emergency department for immediate evaluation    ED Prescriptions   None    PDMP not reviewed this encounter.   Valinda Hoar, [place] 09/27/22 [phone]

## 2022-09-27 NOTE — ED Triage Notes (Signed)
Provider triage  

## 2022-09-27 NOTE — Discharge Instructions (Signed)
For your headache -On exam there are no abnormalities neurologically -You have been given an injection of Toradol, as Benadryl and Zofran here in the office today to help minimize your symptoms -You may continue use of your normal medications for management at home -While headaches are present ensure that you are getting adequate rest and adequate fluid intake -Participate in low stimulation activities avoiding bright lights and loud noises when symptoms are present -If your headaches continue to persist please follow-up with your primary doctor for reevaluation -At any point if you have the worst headache of your life please go to the nearest emergency department for immediate evaluation

## 2022-09-30 DIAGNOSIS — R9439 Abnormal result of other cardiovascular function study: Secondary | ICD-10-CM | POA: Diagnosis not present

## 2022-09-30 DIAGNOSIS — E782 Mixed hyperlipidemia: Secondary | ICD-10-CM | POA: Diagnosis not present

## 2022-09-30 DIAGNOSIS — I5020 Unspecified systolic (congestive) heart failure: Secondary | ICD-10-CM | POA: Diagnosis not present

## 2022-10-05 ENCOUNTER — Other Ambulatory Visit: Payer: Self-pay | Admitting: Internal Medicine

## 2022-10-05 DIAGNOSIS — M810 Age-related osteoporosis without current pathological fracture: Secondary | ICD-10-CM

## 2022-10-12 DIAGNOSIS — M542 Cervicalgia: Secondary | ICD-10-CM | POA: Diagnosis not present

## 2022-10-12 DIAGNOSIS — G518 Other disorders of facial nerve: Secondary | ICD-10-CM | POA: Diagnosis not present

## 2022-10-12 DIAGNOSIS — G43719 Chronic migraine without aura, intractable, without status migrainosus: Secondary | ICD-10-CM | POA: Diagnosis not present

## 2022-10-12 DIAGNOSIS — M791 Myalgia, unspecified site: Secondary | ICD-10-CM | POA: Diagnosis not present

## 2022-10-20 ENCOUNTER — Other Ambulatory Visit: Payer: Self-pay

## 2022-10-20 ENCOUNTER — Ambulatory Visit
Admission: RE | Admit: 2022-10-20 | Discharge: 2022-10-20 | Disposition: A | Discharge status: Home / Self Care | Payer: Medicare HMO | Source: Ambulatory Visit | Attending: Internal Medicine | Admitting: Internal Medicine

## 2022-10-20 ENCOUNTER — Encounter: Payer: Self-pay | Admitting: Internal Medicine

## 2022-10-20 ENCOUNTER — Encounter
Admission: RE | Disposition: A | Discharge status: Home / Self Care | Payer: Self-pay | Source: Ambulatory Visit | Attending: Internal Medicine

## 2022-10-20 DIAGNOSIS — R9430 Abnormal result of cardiovascular function study, unspecified: Secondary | ICD-10-CM | POA: Diagnosis not present

## 2022-10-20 HISTORY — PX: LEFT HEART CATH AND CORONARY ANGIOGRAPHY: CATH118249

## 2022-10-20 SURGERY — LEFT HEART CATH AND CORONARY ANGIOGRAPHY
Anesthesia: Moderate Sedation | Laterality: Left

## 2022-10-20 MED ORDER — SODIUM CHLORIDE 0.9 % IV SOLN: 250.0000 mL | INTRAVENOUS | Status: DC | PRN

## 2022-10-20 MED ORDER — ACETAMINOPHEN 325 MG PO TABS
650.0000 mg | ORAL_TABLET | ORAL | Status: DC | PRN
  Administered 2022-10-20: 650 mg via ORAL

## 2022-10-20 MED ORDER — IOHEXOL 300 MG/ML  SOLN
INTRAMUSCULAR | Status: DC | PRN
  Administered 2022-10-20: 65 mL

## 2022-10-20 MED ORDER — FENTANYL CITRATE (PF) 100 MCG/2ML IJ SOLN
INTRAMUSCULAR | Status: AC
  Filled 2022-10-20: qty 2

## 2022-10-20 MED ORDER — SODIUM CHLORIDE 0.9 % IV SOLN: INTRAVENOUS | Status: DC

## 2022-10-20 MED ORDER — FENTANYL CITRATE (PF) 100 MCG/2ML IJ SOLN
INTRAMUSCULAR | Status: DC | PRN
  Administered 2022-10-20 (×3): 12.5 ug via INTRAVENOUS

## 2022-10-20 MED ORDER — MIDAZOLAM HCL 2 MG/2ML IJ SOLN
INTRAMUSCULAR | Status: AC
  Filled 2022-10-20: qty 2

## 2022-10-20 MED ORDER — ONDANSETRON HCL 4 MG/2ML IJ SOLN: 4.0000 mg | Freq: Four times a day (QID) | INTRAMUSCULAR | Status: DC | PRN

## 2022-10-20 MED ORDER — ACETAMINOPHEN 325 MG PO TABS
ORAL_TABLET | ORAL | Status: AC
  Filled 2022-10-20: qty 2

## 2022-10-20 MED ORDER — HEPARIN SODIUM (PORCINE) 1000 UNIT/ML IJ SOLN
INTRAMUSCULAR | Status: DC | PRN
  Administered 2022-10-20: 2500 [IU] via INTRAVENOUS

## 2022-10-20 MED ORDER — SODIUM CHLORIDE 0.9 % WEIGHT BASED INFUSION: 1.0000 mL/kg/h | INTRAVENOUS | Status: DC

## 2022-10-20 MED ORDER — HEPARIN (PORCINE) IN NACL 1000-0.9 UT/500ML-% IV SOLN
INTRAVENOUS | Status: DC | PRN
  Administered 2022-10-20 (×2): 500 mL

## 2022-10-20 MED ORDER — SODIUM CHLORIDE 0.9% FLUSH: 3.0000 mL | Freq: Two times a day (BID) | INTRAVENOUS | Status: DC

## 2022-10-20 MED ORDER — ASPIRIN 81 MG PO CHEW: 81.0000 mg | CHEWABLE_TABLET | ORAL | Status: DC

## 2022-10-20 MED ORDER — HEPARIN SODIUM (PORCINE) 1000 UNIT/ML IJ SOLN
INTRAMUSCULAR | Status: AC
  Filled 2022-10-20: qty 10

## 2022-10-20 MED ORDER — SODIUM CHLORIDE 0.9% FLUSH: 3.0000 mL | INTRAVENOUS | Status: DC | PRN

## 2022-10-20 MED ORDER — HEPARIN (PORCINE) IN NACL 1000-0.9 UT/500ML-% IV SOLN
INTRAVENOUS | Status: AC
  Filled 2022-10-20: qty 1000

## 2022-10-20 MED ORDER — LIDOCAINE HCL (PF) 1 % IJ SOLN
INTRAMUSCULAR | Status: DC | PRN
  Administered 2022-10-20: 2 mL

## 2022-10-20 MED ORDER — VERAPAMIL HCL 2.5 MG/ML IV SOLN
INTRAVENOUS | Status: AC
  Filled 2022-10-20: qty 2

## 2022-10-20 MED ORDER — MIDAZOLAM HCL 2 MG/2ML IJ SOLN
INTRAMUSCULAR | Status: DC | PRN
  Administered 2022-10-20: .5 mg via INTRAVENOUS

## 2022-10-20 MED ORDER — VERAPAMIL HCL 2.5 MG/ML IV SOLN
INTRAVENOUS | Status: DC | PRN
  Administered 2022-10-20: 2.5 mg via INTRA_ARTERIAL

## 2022-10-20 MED ORDER — LIDOCAINE HCL 1 % IJ SOLN
INTRAMUSCULAR | Status: AC
  Filled 2022-10-20: qty 20

## 2022-10-20 MED ORDER — HYDRALAZINE HCL 20 MG/ML IJ SOLN: 10.0000 mg | INTRAMUSCULAR | Status: DC | PRN

## 2022-10-20 SURGICAL SUPPLY — 12 items
CATH 5FR JL3.5 JR4 ANG PIG MP (CATHETERS) IMPLANT
DEVICE RAD TR BAND REGULAR (VASCULAR PRODUCTS) IMPLANT
DRAPE BRACHIAL (DRAPES) IMPLANT
GLIDESHEATH SLEND SS 6F .021 (SHEATH) IMPLANT
GUIDEWIRE INQWIRE 1.5J.035X260 (WIRE) IMPLANT
INQWIRE 1.5J .035X260CM (WIRE) ×1
KIT SYRINGE INJ CVI SPIKEX1 (MISCELLANEOUS) IMPLANT
PACK CARDIAC CATH (CUSTOM PROCEDURE TRAY) ×1 IMPLANT
PROTECTION STATION PRESSURIZED (MISCELLANEOUS) ×1
SET ATX-X65L (MISCELLANEOUS) IMPLANT
STATION PROTECTION PRESSURIZED (MISCELLANEOUS) IMPLANT
WIRE HITORQ VERSACORE ST 145CM (WIRE) IMPLANT

## 2022-10-21 ENCOUNTER — Encounter: Payer: Self-pay | Admitting: Internal Medicine

## 2022-10-25 ENCOUNTER — Other Ambulatory Visit: Payer: Self-pay | Admitting: Internal Medicine

## 2022-10-25 DIAGNOSIS — Z1231 Encounter for screening mammogram for malignant neoplasm of breast: Secondary | ICD-10-CM

## 2022-10-27 DIAGNOSIS — E782 Mixed hyperlipidemia: Secondary | ICD-10-CM | POA: Diagnosis not present

## 2022-10-27 DIAGNOSIS — I5020 Unspecified systolic (congestive) heart failure: Secondary | ICD-10-CM | POA: Diagnosis not present

## 2022-10-27 DIAGNOSIS — R002 Palpitations: Secondary | ICD-10-CM | POA: Diagnosis not present

## 2022-11-03 DIAGNOSIS — M810 Age-related osteoporosis without current pathological fracture: Secondary | ICD-10-CM | POA: Diagnosis not present

## 2022-11-09 DIAGNOSIS — G43719 Chronic migraine without aura, intractable, without status migrainosus: Secondary | ICD-10-CM | POA: Diagnosis not present

## 2022-11-09 DIAGNOSIS — M791 Myalgia, unspecified site: Secondary | ICD-10-CM | POA: Diagnosis not present

## 2022-11-09 DIAGNOSIS — M542 Cervicalgia: Secondary | ICD-10-CM | POA: Diagnosis not present

## 2022-11-09 DIAGNOSIS — G518 Other disorders of facial nerve: Secondary | ICD-10-CM | POA: Diagnosis not present

## 2022-11-29 ENCOUNTER — Ambulatory Visit
Admission: RE | Admit: 2022-11-29 | Discharge: 2022-11-29 | Disposition: A | Discharge status: Home / Self Care | Payer: Medicare HMO | Source: Ambulatory Visit | Attending: Internal Medicine | Admitting: Internal Medicine

## 2022-11-29 DIAGNOSIS — Z1231 Encounter for screening mammogram for malignant neoplasm of breast: Secondary | ICD-10-CM | POA: Diagnosis not present

## 2022-12-02 ENCOUNTER — Ambulatory Visit
Admission: RE | Admit: 2022-12-02 | Discharge: 2022-12-02 | Disposition: A | Discharge status: Home / Self Care | Payer: Medicare HMO | Source: Ambulatory Visit | Attending: Internal Medicine | Admitting: Internal Medicine

## 2022-12-02 DIAGNOSIS — M810 Age-related osteoporosis without current pathological fracture: Secondary | ICD-10-CM | POA: Insufficient documentation

## 2022-12-07 ENCOUNTER — Ambulatory Visit
Admission: EM | Admit: 2022-12-07 | Discharge: 2022-12-07 | Disposition: A | Discharge status: Home / Self Care | Payer: Medicare HMO | Attending: Emergency Medicine | Admitting: Emergency Medicine

## 2022-12-07 DIAGNOSIS — G43011 Migraine without aura, intractable, with status migrainosus: Secondary | ICD-10-CM | POA: Diagnosis not present

## 2022-12-07 MED ORDER — DIPHENHYDRAMINE HCL 25 MG PO CAPS
25.0000 mg | ORAL_CAPSULE | Freq: Once | ORAL | Status: AC
  Administered 2022-12-07: 25 mg via ORAL

## 2022-12-07 MED ORDER — ONDANSETRON 4 MG PO TBDP
4.0000 mg | ORAL_TABLET | Freq: Once | ORAL | Status: AC
  Administered 2022-12-07: 4 mg via ORAL

## 2022-12-07 MED ORDER — KETOROLAC TROMETHAMINE 30 MG/ML IJ SOLN
30.0000 mg | Freq: Once | INTRAMUSCULAR | Status: AC
  Administered 2022-12-07: 30 mg via INTRAMUSCULAR

## 2022-12-07 NOTE — Discharge Instructions (Signed)
For your headache -On exam there are no abnormalities neurologically -You have been given an injection of Toradol, as Benadryl and Zofran here in the office today to help minimize your symptoms -You may continue use of your normal medications for management at home -While headaches are present ensure that you are getting adequate rest and adequate fluid intake -Participate in low stimulation activities avoiding bright lights and loud noises when symptoms are present -If your headaches continue to persist please follow-up with your primary doctor for reevaluation -At any point if you have the worst headache of your life please

## 2022-12-07 NOTE — ED Provider Notes (Signed)
[name]    CSN: [id number] Arrival date & time: 12/07/22  1236      History   Chief Complaint Chief Complaint  Patient presents with   Migraine    HPI Jordan Ortega is a 67 y.o. female.   Patient presents for evaluation of a left-sided frontal headache present for 2 days.  Headache is constant described as a pounding sensation with associated light and noise sensitivity, flashing lights and nausea without vomiting.  Has attempted use of prescribed medicine for migraines last dose around 615, has been ineffective.  Has been taking injections but doctor is out of town at this time.  Denies head injury or trauma.    Past Medical History:  Diagnosis Date   Actinic keratosis    Adnexal mass    Anxiety    Barrett's esophagus    Bell palsy    left foot also ,    Bronchitis    Complication of anesthesia    difficulty waking up   COPD (chronic obstructive pulmonary disease) (HCC)    Degenerative disc disease, cervical    Depression    Dysplastic nevus 06/08/2019   Right lower abdomen, moderate atypia, limited margins free.   Dysplastic nevus 12/06/2018   right lower abdomen/moderate, limited margins free.   Environmental and seasonal allergies    GERD (gastroesophageal reflux disease)    Heart murmur    Hx   High triglycerides    Migraines    Migraines    Rapid heart rate     Patient Active Problem List   Diagnosis Date Noted   Age-related osteoporosis with current pathological fracture with routine healing 04/03/2021   Dysplastic nevus 06/08/2019   Abdominal pain 10/16/2018   Abnormal CT scan, colon 10/16/2018   Personal history of tobacco use, presenting hazards to health 10/03/2018   Arm pain 12/05/2017   Centrilobular emphysema (HCC) 12/04/2017   SOB (shortness of breath) 03/21/2017   Premature ventricular contractions 10/21/2016   Heart palpitations 10/05/2016   Cyst of right ovary 08/18/2016   Adnexal mass 08/17/2016   Bilateral carpal  tunnel syndrome 04/08/2016   Bilateral hand pain 04/08/2016   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   Acute onset aura migraine 07/10/2014   Bad memory 07/10/2014   Migraine without aura 07/10/2014   Abdominal pain, epigastric 07/10/2014   Chronic constipation 07/10/2014   Weakness of left side of body 11/28/2011   Acute anxiety 11/20/2009   Anxiety and depression 11/20/2009   Edema of extremities 11/20/2009   Hypercholesterolemia without hypertriglyceridemia 06/18/2009   Depletion of volume of extracellular fluid 06/16/2009   Malaise and fatigue 10/29/2008   Weight loss, unintentional 10/29/2008   Mixed hyperlipidemia 08/22/2008   Adaptive colitis 05/17/2008   Affective bipolar disorder (HCC) 05/17/2008   Barrett esophagus 05/17/2008    Past Surgical History:  Procedure Laterality Date   ABDOMINAL HYSTERECTOMY     APPENDECTOMY     CARDIAC CATHETERIZATION     [facility] pt doesn't have any stents or recall years ago 20+ yrs ago   CHOLECYSTECTOMY     COLONOSCOPY WITH PROPOFOL N/A 07/16/2016   Procedure: COLONOSCOPY WITH PROPOFOL;  Surgeon: Wyline Mood, MD;  Location: [facility] ENDOSCOPY;  Service: Endoscopy;  Laterality: N/A;   COLONOSCOPY WITH PROPOFOL N/A 10/20/2018   Procedure: COLONOSCOPY WITH PROPOFOL;  Surgeon: Wyline Mood, MD;  Location: [facility] ENDOSCOPY;  Service: Gastroenterology;  Laterality: N/A;   COLONOSCOPY WITH PROPOFOL N/A 07/29/2021   Procedure: COLONOSCOPY WITH PROPOFOL;  Surgeon: Wyline Mood, MD;  Location: [facility] ENDOSCOPY;  Service: Gastroenterology;  Laterality: N/A;   DIAGNOSTIC LAPAROSCOPY     ESOPHAGOGASTRODUODENOSCOPY N/A 07/29/2021   Procedure: ESOPHAGOGASTRODUODENOSCOPY (EGD);  Surgeon: Wyline Mood, MD;  Location: [facility] ENDOSCOPY;  Service: Gastroenterology;  Laterality: N/A;   ESOPHAGOGASTRODUODENOSCOPY (EGD) WITH PROPOFOL N/A 07/16/2016   Procedure: ESOPHAGOGASTRODUODENOSCOPY (EGD) WITH PROPOFOL;  Surgeon: Wyline Mood, MD;  Location: [facility] ENDOSCOPY;  Service: Endoscopy;   Laterality: N/A;   ESOPHAGOGASTRODUODENOSCOPY (EGD) WITH PROPOFOL N/A 12/05/2018   Procedure: ESOPHAGOGASTRODUODENOSCOPY (EGD) WITH PROPOFOL;  Surgeon: Wyline Mood, MD;  Location: [facility] ENDOSCOPY;  Service: Gastroenterology;  Laterality: N/A;   GALLBLADDER SURGERY  1980   GIVENS CAPSULE STUDY N/A 12/21/2018   Procedure: GIVENS CAPSULE STUDY;  Surgeon: Wyline Mood, MD;  Location: [facility] ENDOSCOPY;  Service: Gastroenterology;  Laterality: N/A;   LAPAROSCOPIC SALPINGO OOPHERECTOMY Bilateral 08/27/2016   Procedure: LAPAROSCOPIC SALPINGO OOPHORECTOMY;  Surgeon: Nadara Mustard, MD;  Location: [facility] ORS;  Service: Gynecology;  Laterality: Bilateral;   LEFT HEART CATH AND CORONARY ANGIOGRAPHY Left 10/20/2022   Procedure: LEFT HEART CATH AND CORONARY ANGIOGRAPHY;  Surgeon: Alwyn Pea, MD;  Location: [facility] INVASIVE CV LAB;  Service: Cardiovascular;  Laterality: Left;   TUBAL LIGATION  1983    OB History     Gravida  5   Para  3   Term  3   Preterm      AB  2   Living  3      SAB      IAB  2   Ectopic      Multiple      Live Births               Home Medications    Prior to Admission medications   Medication Sig Start Date End Date Taking? Authorizing Provider  AIMOVIG 140 MG/ML SOAJ Inject into the skin. Patient not taking: Reported on 10/20/2022 04/01/21   [provider]  albuterol (PROVENTIL HFA;VENTOLIN HFA) 108 (90 Base) MCG/ACT inhaler Inhale 2 puffs into the lungs every 6 (six) hours as needed for wheezing or shortness of breath. 11/01/17   Chrismon, Jodell Cipro, PA-C  ALPRAZolam (XANAX) 0.5 MG tablet Take 1 tablet (0.5 mg total) by mouth 3 (three) times daily as needed for anxiety. 11/19/16   Chrismon, Jodell Cipro, PA-C  baclofen (LIORESAL) 10 MG tablet Take 1 tablet (10 mg total) by mouth 3 (three) times daily. Patient not taking: Reported on 10/20/2022 04/21/20   Margaretann Loveless, PA-C  budesonide-formoterol [facility]) 160-4.5 MCG/ACT inhaler Inhale 1 puff  into the lungs 2 (two) times daily. Patient not taking: Reported on 10/20/2022 03/21/17   Chrismon, Jodell Cipro, PA-C  butalbital-acetaminophen-caffeine (FIORICET) 50-325-40 MG tablet Take 1 tablet by mouth daily as needed. Patient not taking: Reported on 10/20/2022 12/15/21   [provider]  calcium carbonate (CALCIUM 600) 600 MG TABS tablet Take 600 mg by mouth 2 (two) times daily with a meal.    [provider]  CHOLECALCIFEROL PO Take by mouth. 1000 units    [provider]  clobetasol ointment (TEMOVATE) 0.05 % Apply 1 Application topically 2 (two) times daily. To affected area rash. Avoid applying to face, groin, and axilla. Use as directed. Long-term use can cause thinning of the skin. 09/10/21   Moye, [place], MD  cyanocobalamin 1000 MCG tablet Take 1,000 mcg by mouth daily.    [provider]  cyclobenzaprine (FLEXERIL) 5 MG tablet Take 1 tablet (5 mg total) by mouth 3 (three) times daily as  needed for muscle spasms. Patient not taking: Reported on 10/20/2022 04/21/20   Margaretann Loveless, PA-C  dicyclomine (BENTYL) 20 MG tablet Take 1 tablet by mouth every 6 (six) hours. 06/04/21 06/04/22  [provider]  diphenhydrAMINE (BENADRYL) 50 MG capsule 1 capsule 1 hour prior to [facility] 05/01/21   [provider]  DULoxetine (CYMBALTA) 60 MG capsule Take 60 mg by mouth daily.    [provider]  Fluticasone-Umeclidin-Vilant (TRELEGY ELLIPTA) 100-62.5-25 MCG/ACT AEPB Inhale into the lungs. Patient not taking: Reported on 10/20/2022    [provider]  HYDROcodone-acetaminophen (NORCO/VICODIN) 5-325 MG tablet Take 1 tablet by mouth every 4 (four) hours as needed for moderate pain or severe pain. 04/21/20   Margaretann Loveless, PA-C  HYDROCODONE-CHLORPHENIRAMINE PO Take by mouth. Patient not taking: Reported on 12/07/2022    [provider]  loratadine (CLARITIN) 10 MG tablet Take by mouth.    [provider]  losartan  (COZAAR) 25 MG tablet Take 12.5 mg by mouth daily.    [provider]  magnesium gluconate (MAGONATE) 500 MG tablet Take by mouth.    [provider]  meloxicam (MOBIC) 15 MG tablet Take 1 tablet by mouth daily. Patient not taking: Reported on 10/20/2022 03/25/21   [provider]  METAMUCIL FIBER PO Take by mouth. Patient not taking: Reported on 10/20/2022    [provider]  mirtazapine (REMERON) 30 MG tablet Take 30 mg by mouth at bedtime. 04/30/21   [provider]  Multiple Vitamin (MULTIVITAMIN) tablet Take 1 tablet by mouth daily.    [provider]  Multiple Vitamins-Minerals (HAIR SKIN & NAILS PO) Take by mouth. Patient not taking: Reported on 10/20/2022    [provider]  Multiple Vitamins-Minerals (PRESERVISION AREDS 2) CAPS Take 2 1e11 Vector [employer] by mouth.    [provider]  omeprazole (PRILOSEC) 40 MG capsule Take 1 capsule (40 mg total) by mouth in the morning and at bedtime. Patient not taking: Reported on 10/20/2022 07/30/21   Wyline Mood, MD  Pantoprazole Sodium (PROTONIX PO) Take 40 mg by mouth.    [provider]  promethazine (PHENERGAN) 25 MG tablet Take 1 tablet (25 mg total) by mouth every 8 (eight) hours as needed for nausea or vomiting. 10/17/18   Margaretann Loveless, PA-C  simvastatin (ZOCOR) 20 MG tablet TAKE ONE TABLET BY MOUTH AT BEDTIME 02/08/20   Margaretann Loveless, PA-C  sucralfate (CARAFATE) 1 g tablet TAKE 1 TABLET BY MOUTH 4 TIMES DAILY Patient not taking: Reported on 10/20/2022 08/26/21   Wyline Mood, MD  traZODone (DESYREL) 50 MG tablet Take 50 mg by mouth at bedtime.    [provider]  Ubrogepant (UBRELVY) 100 MG TABS Take by mouth. Patient not taking: Reported on 10/20/2022    [provider]    Family History Family History  Problem Relation Age of Onset   Breast cancer Mother 29   Skin cancer Mother    Hypothyroidism Mother    Parkinson's disease Mother     Osteoporosis Mother    Colon cancer Father    Lung cancer Father    Heart disease Father    Migraines Sister    Breast cancer Maternal Aunt        post men.   Diabetes Paternal Uncle    Diabetes Paternal Grandmother    Hypertension Paternal Grandmother    Stroke Paternal Grandmother    Dementia Paternal Grandfather    Breast cancer Cousin 11  maternal    Multiple myeloma Cousin     Social History Social History   Tobacco Use   Smoking status: Every Day    Current packs/day: 1.00    Average packs/day: 1 pack/day for 46.0 years (46.0 ttl pk-yrs)    Types: Cigarettes   Smokeless tobacco: Never   Tobacco comments:    .5ppd currently    04/06/2021- Patient states she has cut back to 1 pack a day   Vaping Use   Vaping status: Never Used  Substance Use Topics   Alcohol use: Not Currently    Alcohol/week: 0.0 standard drinks of alcohol   Drug use: No     Allergies   Diazepam, Ciprofloxacin, Codeine, Dexamethasone, Metoprolol, Morphine, Niacin and related, Topiramate, and Doxycycline   Review of Systems Review of Systems   Physical Exam Triage Vital Signs ED Triage Vitals  Encounter Vitals Group     BP 12/07/22 1312 113/77     Systolic BP Percentile --      Diastolic BP Percentile --      Pulse Rate 12/07/22 1312 67     Resp 12/07/22 1312 17     Temp 12/07/22 1312 98.8 F (37.1 C)     Temp Source 12/07/22 1312 Oral     SpO2 12/07/22 1312 97 %     Weight --      Height --      Head Circumference --      Peak Flow --      Pain Score 12/07/22 1308 9     Pain Loc --      Pain Education --      Exclude from Growth Chart --    No data found.  Updated Vital Signs BP 113/77 (BP Location: Left Arm)   Pulse 67   Temp 98.8 F (37.1 C) (Oral)   Resp 17   SpO2 97%   Visual Acuity Right Eye Distance:   Left Eye Distance:   Bilateral Distance:    Right Eye Near:   Left Eye Near:    Bilateral Near:     Physical Exam Constitutional:       Appearance: Normal appearance.  Eyes:     Extraocular Movements: Extraocular movements intact.     Conjunctiva/sclera: Conjunctivae normal.     Pupils: Pupils are equal, round, and reactive to light.  Pulmonary:     Effort: Pulmonary effort is normal.  Neurological:     General: No focal deficit present.     Mental Status: She is alert and oriented to person, place, and time. Mental status is at baseline.     Cranial Nerves: No cranial nerve deficit.     Sensory: No sensory deficit.     Motor: No weakness.     Coordination: Coordination normal.     Gait: Gait normal.      UC Treatments / Results  Labs (all labs ordered are listed, but only abnormal results are displayed) Labs Reviewed - No data to display  EKG   Radiology No results found.  Procedures Procedures (including critical care time)  Medications Ordered in UC Medications  ketorolac (TORADOL) 30 MG/ML injection 30 mg (30 mg Intramuscular Given 12/07/22 1330)  ondansetron (ZOFRAN-ODT) disintegrating tablet 4 mg (4 mg Oral Given 12/07/22 1330)  diphenhydrAMINE (BENADRYL) capsule 25 mg (25 mg Oral Given 12/07/22 1330)    Initial Impression / Assessment and Plan / UC Course  I have reviewed the triage vital signs and the nursing notes.  Pertinent labs & imaging results that were available during my care of the patient were reviewed by me and considered in my medical decision making (see chart for details).  Tractable migraine without aura and without status migrainosus  Vitals are stable, no neurological deficits present on exam, consistent with prior presentation of migraine without new symptoms, stable for outpatient management, given Toradol IM and oral Benadryl and Zofran, has had success with this combination in the past, recommended continue use of prescribed medicine as needed with low stimulation activities, good fluid intake and rest, advised follow-up with her primary doctor when they return to town Final  Clinical Impressions(s) / UC Diagnoses   Final diagnoses:  Intractable migraine without aura and with status migrainosus     Discharge Instructions      For your headache -On exam there are no abnormalities neurologically -You have been given an injection of Toradol, as Benadryl and Zofran here in the office today to help minimize your symptoms -You may continue use of your normal medications for management at home -While headaches are present ensure that you are getting adequate rest and adequate fluid intake -Participate in low stimulation activities avoiding bright lights and loud noises when symptoms are present -If your headaches continue to persist please follow-up with your primary doctor for reevaluation -At any point if you have the worst headache of your life please   ED Prescriptions   None    PDMP not reviewed this encounter.   Valinda Hoar, NP 12/07/22 1442

## 2022-12-07 NOTE — ED Triage Notes (Signed)
Patient to Urgent Care with complaints of a migraine headache that started two days ago. Hx of the same. Took all of her regular medications without relief. Has not been able to break the migraine. Having nausea/ light sensitivity/ sound sensitivity.   Reports feeling like her regular migraine. Typically gets nerve block injections but is at the end of her cycle and her provider is out of town.

## 2022-12-14 DIAGNOSIS — H52223 Regular astigmatism, bilateral: Secondary | ICD-10-CM | POA: Diagnosis not present

## 2022-12-14 DIAGNOSIS — H5203 Hypermetropia, bilateral: Secondary | ICD-10-CM | POA: Diagnosis not present

## 2022-12-14 DIAGNOSIS — H04123 Dry eye syndrome of bilateral lacrimal glands: Secondary | ICD-10-CM | POA: Diagnosis not present

## 2022-12-14 DIAGNOSIS — H40003 Preglaucoma, unspecified, bilateral: Secondary | ICD-10-CM | POA: Diagnosis not present

## 2022-12-20 DIAGNOSIS — Z0100 Encounter for examination of eyes and vision without abnormal findings: Secondary | ICD-10-CM | POA: Diagnosis not present

## 2022-12-28 DIAGNOSIS — G43719 Chronic migraine without aura, intractable, without status migrainosus: Secondary | ICD-10-CM | POA: Diagnosis not present

## 2022-12-28 DIAGNOSIS — G518 Other disorders of facial nerve: Secondary | ICD-10-CM | POA: Diagnosis not present

## 2022-12-28 DIAGNOSIS — M542 Cervicalgia: Secondary | ICD-10-CM | POA: Diagnosis not present

## 2022-12-28 DIAGNOSIS — M791 Myalgia, unspecified site: Secondary | ICD-10-CM | POA: Diagnosis not present

## 2023-01-05 DIAGNOSIS — M7012 Bursitis, left hand: Secondary | ICD-10-CM | POA: Diagnosis not present

## 2023-01-05 DIAGNOSIS — G43019 Migraine without aura, intractable, without status migrainosus: Secondary | ICD-10-CM | POA: Diagnosis not present

## 2023-01-05 DIAGNOSIS — M1812 Unilateral primary osteoarthritis of first carpometacarpal joint, left hand: Secondary | ICD-10-CM | POA: Diagnosis not present

## 2023-01-05 DIAGNOSIS — J432 Centrilobular emphysema: Secondary | ICD-10-CM | POA: Diagnosis not present

## 2023-01-05 DIAGNOSIS — M25542 Pain in joints of left hand: Secondary | ICD-10-CM | POA: Diagnosis not present

## 2023-01-14 DIAGNOSIS — H40003 Preglaucoma, unspecified, bilateral: Secondary | ICD-10-CM | POA: Diagnosis not present

## 2023-01-17 DIAGNOSIS — Z03818 Encounter for observation for suspected exposure to other biological agents ruled out: Secondary | ICD-10-CM | POA: Diagnosis not present

## 2023-01-17 DIAGNOSIS — J208 Acute bronchitis due to other specified organisms: Secondary | ICD-10-CM | POA: Diagnosis not present

## 2023-01-17 DIAGNOSIS — R6889 Other general symptoms and signs: Secondary | ICD-10-CM | POA: Diagnosis not present

## 2023-01-17 DIAGNOSIS — G43019 Migraine without aura, intractable, without status migrainosus: Secondary | ICD-10-CM | POA: Diagnosis not present

## 2023-01-20 DIAGNOSIS — I5020 Unspecified systolic (congestive) heart failure: Secondary | ICD-10-CM | POA: Diagnosis not present

## 2023-01-20 DIAGNOSIS — E782 Mixed hyperlipidemia: Secondary | ICD-10-CM | POA: Diagnosis not present

## 2023-01-20 DIAGNOSIS — R002 Palpitations: Secondary | ICD-10-CM | POA: Diagnosis not present

## 2023-01-21 DIAGNOSIS — H40031 Anatomical narrow angle, right eye: Secondary | ICD-10-CM | POA: Diagnosis not present

## 2023-01-21 DIAGNOSIS — H40003 Preglaucoma, unspecified, bilateral: Secondary | ICD-10-CM | POA: Diagnosis not present

## 2023-01-21 DIAGNOSIS — H40033 Anatomical narrow angle, bilateral: Secondary | ICD-10-CM | POA: Diagnosis not present

## 2023-01-21 DIAGNOSIS — H40032 Anatomical narrow angle, left eye: Secondary | ICD-10-CM | POA: Diagnosis not present

## 2023-01-22 ENCOUNTER — Other Ambulatory Visit: Payer: Self-pay

## 2023-01-22 ENCOUNTER — Ambulatory Visit
Admission: EM | Admit: 2023-01-22 | Discharge: 2023-01-22 | Disposition: A | Discharge status: Home / Self Care | Payer: Medicare HMO | Attending: Emergency Medicine | Admitting: Emergency Medicine

## 2023-01-22 ENCOUNTER — Encounter: Payer: Self-pay | Admitting: *Deleted

## 2023-01-22 DIAGNOSIS — J441 Chronic obstructive pulmonary disease with (acute) exacerbation: Secondary | ICD-10-CM | POA: Diagnosis not present

## 2023-01-22 MED ORDER — LEVOFLOXACIN 500 MG PO TABS: 500.0000 mg | ORAL_TABLET | Freq: Every day | ORAL | 0 refills | Status: AC

## 2023-01-22 MED ORDER — PREDNISONE 50 MG PO TABS: 50.0000 mg | ORAL_TABLET | Freq: Every day | ORAL | 0 refills | Status: AC

## 2023-01-22 NOTE — ED Triage Notes (Signed)
Pt has been sick for one week . Pt was seen and treated by her PCP on Monday. Pt reports cough is so sever the muscles on her Lt hurt. Pt has a hard time sleeping.

## 2023-01-22 NOTE — Discharge Instructions (Signed)
Your evaluated for your cough and congestion which is most likely a COPD flare  As you are currently taking antibiotics and steroids treatment is very limited at this time  You have been given an injection of high-dose steroids to try to open and relax your airway, and ideally you will see improvement within the hour  Starting tomorrow take prednisone 50 mg every morning with food for 5 days to continue the above process  Continue taking all inhalers as directed  Stop use of amoxicillin and begin Levaquin every morning for 7 days  If you have not seen any improvement in your symptoms after 2 to 3 days of medication use please go to the nearest emergency department as you will need IV medicine

## 2023-01-23 NOTE — ED Provider Notes (Signed)
Jordan Ortega    CSN: [id number] Arrival date & time: 01/22/23  1506      History   Chief Complaint Chief Complaint  Patient presents with   Facial Pain   Cough    HPI Jordan Ortega is a 67 y.o. female.   Patient presents for evaluation of nasal congestion, rhinorrhea, productive cough and shortness of breath present for 6 days.  Evaluated by PCP, started on amoxicillin and prednisone but endorses cough is progressively worsening.  Episodes of vomiting elicited by coughing.  History of COPD.  Taking daily inhalers as prescribed.  Denies fever  Past Medical History:  Diagnosis Date   Actinic keratosis    Adnexal mass    Anxiety    Barrett's esophagus    Bell palsy    left foot also ,    Bronchitis    Complication of anesthesia    difficulty waking up   COPD (chronic obstructive pulmonary disease) (HCC)    Degenerative disc disease, cervical    Depression    Dysplastic nevus 06/08/2019   Right lower abdomen, moderate atypia, limited margins free.   Dysplastic nevus 12/06/2018   right lower abdomen/moderate, limited margins free.   Environmental and seasonal allergies    GERD (gastroesophageal reflux disease)    Heart murmur    Hx   High triglycerides    Migraines    Migraines    Rapid heart rate     Patient Active Problem List   Diagnosis Date Noted   Age-related osteoporosis with current pathological fracture with routine healing 04/03/2021   Dysplastic nevus 06/08/2019   Abdominal pain 10/16/2018   Abnormal CT scan, colon 10/16/2018   Personal history of tobacco use, presenting hazards to health 10/03/2018   Arm pain 12/05/2017   Centrilobular emphysema (HCC) 12/04/2017   SOB (shortness of breath) 03/21/2017   Premature ventricular contractions 10/21/2016   Heart palpitations 10/05/2016   Cyst of right ovary 08/18/2016   Adnexal mass 08/17/2016   Bilateral carpal tunnel syndrome 04/08/2016   Bilateral hand pain 04/08/2016   Hemiplegic  migraine 07/10/2014   Acute onset aura migraine 07/10/2014   Bad memory 07/10/2014   Migraine without aura 07/10/2014   Abdominal pain, epigastric 07/10/2014   Chronic constipation 07/10/2014   Weakness of left side of body 11/28/2011   Acute anxiety 11/20/2009   Anxiety and depression 11/20/2009   Edema of extremities 11/20/2009   Hypercholesterolemia without hypertriglyceridemia 06/18/2009   Depletion of volume of extracellular fluid 06/16/2009   Malaise and fatigue 10/29/2008   Weight loss, unintentional 10/29/2008   Mixed hyperlipidemia 08/22/2008   Adaptive colitis 05/17/2008   Affective bipolar disorder (HCC) 05/17/2008   Barrett esophagus 05/17/2008    Past Surgical History:  Procedure Laterality Date   ABDOMINAL HYSTERECTOMY     APPENDECTOMY     CARDIAC CATHETERIZATION     [facility] pt doesn't have any stents or recall years ago 20+ yrs ago   CHOLECYSTECTOMY     COLONOSCOPY WITH PROPOFOL N/A 07/16/2016   Procedure: COLONOSCOPY WITH PROPOFOL;  Surgeon: Wyline Mood, MD;  Location: [facility] ENDOSCOPY;  Service: Endoscopy;  Laterality: N/A;   COLONOSCOPY WITH PROPOFOL N/A 10/20/2018   Procedure: COLONOSCOPY WITH PROPOFOL;  Surgeon: Wyline Mood, MD;  Location: [facility] ENDOSCOPY;  Service: Gastroenterology;  Laterality: N/A;   COLONOSCOPY WITH PROPOFOL N/A 07/29/2021   Procedure: COLONOSCOPY WITH PROPOFOL;  Surgeon: Wyline Mood, MD;  Location: [facility] ENDOSCOPY;  Service: Gastroenterology;  Laterality: N/A;   DIAGNOSTIC LAPAROSCOPY  ESOPHAGOGASTRODUODENOSCOPY N/A 07/29/2021   Procedure: ESOPHAGOGASTRODUODENOSCOPY (EGD);  Surgeon: Wyline Mood, MD;  Location: [facility] ENDOSCOPY;  Service: Gastroenterology;  Laterality: N/A;   ESOPHAGOGASTRODUODENOSCOPY (EGD) WITH PROPOFOL N/A 07/16/2016   Procedure: ESOPHAGOGASTRODUODENOSCOPY (EGD) WITH PROPOFOL;  Surgeon: Wyline Mood, MD;  Location: [facility] ENDOSCOPY;  Service: Endoscopy;  Laterality: N/A;   ESOPHAGOGASTRODUODENOSCOPY (EGD) WITH PROPOFOL N/A 12/05/2018    Procedure: ESOPHAGOGASTRODUODENOSCOPY (EGD) WITH PROPOFOL;  Surgeon: Wyline Mood, MD;  Location: [facility] ENDOSCOPY;  Service: Gastroenterology;  Laterality: N/A;   GALLBLADDER SURGERY  1980   GIVENS CAPSULE STUDY N/A 12/21/2018   Procedure: GIVENS CAPSULE STUDY;  Surgeon: Wyline Mood, MD;  Location: [facility] ENDOSCOPY;  Service: Gastroenterology;  Laterality: N/A;   LAPAROSCOPIC SALPINGO OOPHERECTOMY Bilateral 08/27/2016   Procedure: LAPAROSCOPIC SALPINGO OOPHORECTOMY;  Surgeon: Nadara Mustard, MD;  Location: [facility] ORS;  Service: Gynecology;  Laterality: Bilateral;   LEFT HEART CATH AND CORONARY ANGIOGRAPHY Left 10/20/2022   Procedure: LEFT HEART CATH AND CORONARY ANGIOGRAPHY;  Surgeon: Alwyn Pea, MD;  Location: [facility] INVASIVE CV LAB;  Service: Cardiovascular;  Laterality: Left;   TUBAL LIGATION  1983    OB History     Gravida  5   Para  3   Term  3   Preterm      AB  2   Living  3      SAB      IAB  2   Ectopic      Multiple      Live Births               Home Medications    Prior to Admission medications   Medication Sig Start Date End Date Taking? Authorizing Provider  albuterol (PROVENTIL HFA;VENTOLIN HFA) 108 (90 Base) MCG/ACT inhaler Inhale 2 puffs into the lungs every 6 (six) hours as needed for wheezing or shortness of breath. 11/01/17  Yes Chrismon, Jodell Cipro, PA-C  ALPRAZolam (XANAX) 0.5 MG tablet Take 1 tablet (0.5 mg total) by mouth 3 (three) times daily as needed for anxiety. 11/19/16  Yes Chrismon, Jodell Cipro, PA-C  calcium carbonate (CALCIUM 600) 600 MG TABS tablet Take 600 mg by mouth 2 (two) times daily with a meal.   Yes [provider]  CHOLECALCIFEROL PO Take by mouth. 1000 units   Yes [provider]  cyanocobalamin 1000 MCG tablet Take 1,000 mcg by mouth daily.   Yes [provider]  DULoxetine (CYMBALTA) 60 MG capsule Take 60 mg by mouth daily.   Yes [provider]  Fluticasone-Umeclidin-Vilant (TRELEGY  ELLIPTA) 100-62.5-25 MCG/ACT AEPB Inhale into the lungs.   Yes [provider]  HYDROcodone-acetaminophen (NORCO/VICODIN) 5-325 MG tablet Take 1 tablet by mouth every 4 (four) hours as needed for moderate pain or severe pain. 04/21/20  Yes Burnette, Alessandra Bevels, PA-C  HYDROCODONE-CHLORPHENIRAMINE PO Take by mouth.   Yes [provider]  levofloxacin (LEVAQUIN) 500 MG tablet Take 1 tablet (500 mg total) by mouth daily. 01/22/23  Yes Chaselyn Nanney R, NP  loratadine (CLARITIN) 10 MG tablet Take by mouth.   Yes [provider]  mirtazapine (REMERON) 30 MG tablet Take 30 mg by mouth at bedtime. 04/30/21  Yes [provider]  Multiple Vitamin (MULTIVITAMIN) tablet Take 1 tablet by mouth daily.   Yes [provider]  Multiple Vitamins-Minerals (PRESERVISION AREDS 2) CAPS Take 2 1e11 Vector Genomes by mouth.   Yes [provider]  Pantoprazole Sodium (PROTONIX PO) Take 40 mg by mouth.   Yes [provider]  predniSONE (DELTASONE) 50 MG tablet Take 1 tablet (50 mg total) by mouth daily with breakfast for 5 days. 01/22/23 01/27/23 Yes Shakir Petrosino, Elita Boone, NP  traZODone (DESYREL) 50 MG tablet Take 50 mg by mouth at bedtime.   Yes [provider]  AIMOVIG 140 MG/ML SOAJ Inject into the skin. Patient not taking: Reported on 10/20/2022 04/01/21   [provider]  baclofen (LIORESAL) 10 MG tablet Take 1 tablet (10 mg total) by mouth 3 (three) times daily. Patient not taking: Reported on 10/20/2022 04/21/20   Margaretann Loveless, PA-C  budesonide-formoterol [facility]) 160-4.5 MCG/ACT inhaler Inhale 1 puff into the lungs 2 (two) times daily. Patient not taking: Reported on 10/20/2022 03/21/17   Chrismon, Jodell Cipro, PA-C  butalbital-acetaminophen-caffeine (FIORICET) 50-325-40 MG tablet Take 1 tablet by mouth daily as needed. Patient not taking: Reported on 10/20/2022 12/15/21   [provider]  clobetasol ointment (TEMOVATE) 0.05 %  Apply 1 Application topically 2 (two) times daily. To affected area rash. Avoid applying to face, groin, and axilla. Use as directed. Long-term use can cause thinning of the skin. 09/10/21   Moye, [place], MD  cyclobenzaprine (FLEXERIL) 5 MG tablet Take 1 tablet (5 mg total) by mouth 3 (three) times daily as needed for muscle spasms. Patient not taking: Reported on 10/20/2022 04/21/20   Margaretann Loveless, PA-C  dicyclomine (BENTYL) 20 MG tablet Take 1 tablet by mouth every 6 (six) hours. 06/04/21 06/04/22  [provider]  diphenhydrAMINE (BENADRYL) 50 MG capsule 1 capsule 1 hour prior to [facility] 05/01/21   [provider]  losartan (COZAAR) 25 MG tablet Take 12.5 mg by mouth daily.    [provider]  magnesium gluconate (MAGONATE) 500 MG tablet Take by mouth.    [provider]  meloxicam (MOBIC) 15 MG tablet Take 1 tablet by mouth daily. Patient not taking: Reported on 10/20/2022 03/25/21   [provider]  METAMUCIL FIBER PO Take by mouth. Patient not taking: Reported on 10/20/2022    [provider]  Multiple Vitamins-Minerals (HAIR SKIN & NAILS PO) Take by mouth. Patient not taking: Reported on 10/20/2022    [provider]  omeprazole (PRILOSEC) 40 MG capsule Take 1 capsule (40 mg total) by mouth in the morning and at bedtime. Patient not taking: Reported on 10/20/2022 07/30/21   Wyline Mood, MD  promethazine (PHENERGAN) 25 MG tablet Take 1 tablet (25 mg total) by mouth every 8 (eight) hours as needed for nausea or vomiting. 10/17/18   Margaretann Loveless, PA-C  simvastatin (ZOCOR) 20 MG tablet TAKE ONE TABLET BY MOUTH AT BEDTIME 02/08/20   Margaretann Loveless, PA-C  sucralfate (CARAFATE) 1 g tablet TAKE 1 TABLET BY MOUTH 4 TIMES DAILY Patient not taking: Reported on 10/20/2022 08/26/21   Wyline Mood, MD  Ubrogepant (UBRELVY) 100 MG TABS Take by mouth. Patient not taking: Reported on 10/20/2022    [provider]    Family  History Family History  Problem Relation Age of Onset   Breast cancer Mother 50   Skin cancer Mother    Hypothyroidism Mother    Parkinson's disease Mother    Osteoporosis Mother    Colon cancer Father    Lung cancer Father    Heart disease Father    Migraines Sister    Breast cancer Maternal Aunt        post men.   Diabetes Paternal Uncle    Diabetes Paternal Grandmother    Hypertension Paternal Grandmother  Stroke Paternal Grandmother    Dementia Paternal Grandfather    Breast cancer Cousin 34       maternal    Multiple myeloma Cousin     Social History Social History   Tobacco Use   Smoking status: Every Day    Current packs/day: 1.00    Average packs/day: 1 pack/day for 46.0 years (46.0 ttl pk-yrs)    Types: Cigarettes   Smokeless tobacco: Never   Tobacco comments:    .5ppd currently    04/06/2021- Patient states she has cut back to 1 pack a day   Vaping Use   Vaping status: Never Used  Substance Use Topics   Alcohol use: Not Currently    Alcohol/week: 0.0 standard drinks of alcohol   Drug use: No     Allergies   Diazepam, Ciprofloxacin, Codeine, Dexamethasone, Metoprolol, Morphine, Niacin and related, Topiramate, and Doxycycline   Review of Systems Review of Systems  Respiratory:  Positive for cough.      Physical Exam Triage Vital Signs ED Triage Vitals  Encounter Vitals Group     BP 01/22/23 1540 113/67     Systolic BP Percentile --      Diastolic BP Percentile --      Pulse Rate 01/22/23 1540 61     Resp 01/22/23 1540 20     Temp 01/22/23 1540 99.1 F (37.3 C)     Temp src --      SpO2 01/22/23 1540 97 %     Weight --      Height --      Head Circumference --      Peak Flow --      Pain Score 01/22/23 1534 0     Pain Loc --      Pain Education --      Exclude from Growth Chart --    No data found.  Updated Vital Signs BP 113/67   Pulse 61   Temp 99.1 F (37.3 C)   Resp 20   SpO2 97%   Visual Acuity Right Eye Distance:    Left Eye Distance:   Bilateral Distance:    Right Eye Near:   Left Eye Near:    Bilateral Near:     Physical Exam Constitutional:      Appearance: Normal appearance.  Eyes:     Extraocular Movements: Extraocular movements intact.  Cardiovascular:     Pulses: Normal pulses.  Pulmonary:     Comments: Rhonchi present to the right upper lobe, remaining lobes clear but diminished Neurological:     Mental Status: She is alert and oriented to person, place, and time. Mental status is at baseline.      UC Treatments / Results  Labs (all labs ordered are listed, but only abnormal results are displayed) Labs Reviewed - No data to display  EKG   Radiology No results found.  Procedures Procedures (including critical care time)  Medications Ordered in UC Medications - No data to display  Initial Impression / Assessment and Plan / UC Course  I have reviewed the triage vital signs and the nursing notes.  Pertinent labs & imaging results that were available during my care of the patient were reviewed by me and considered in my medical decision making (see chart for details).  COPD exacerbation  Vital signs are stable, O2 saturation 97% on room air, rhonchi heard to auscultation most likely COPD flare, discussed with patient, current treatment course failed, methylprednisolone IM given and prescribed  Levaquin and extended prednisone course for outpatient use, patient given strict precautions that if no improvement seen within 2 to 3 days of medication adjustment she is to go to the nearest emergency department for immediate evaluation, verbalized understanding Final Clinical Impressions(s) / UC Diagnoses   Final diagnoses:  COPD exacerbation [facility])     Discharge Instructions      Your evaluated for your cough and congestion which is most likely a COPD flare  As you are currently taking antibiotics and steroids treatment is very limited at this time  You have been given an  injection of high-dose steroids to try to open and relax your airway, and ideally you will see improvement within the hour  Starting tomorrow take prednisone 50 mg every morning with food for 5 days to continue the above process  Continue taking all inhalers as directed  Stop use of amoxicillin and begin Levaquin every morning for 7 days  If you have not seen any improvement in your symptoms after 2 to 3 days of medication use please go to the nearest emergency department as you will need IV medicine   ED Prescriptions     Medication Sig Dispense Auth. Provider   levofloxacin (LEVAQUIN) 500 MG tablet Take 1 tablet (500 mg total) by mouth daily. 7 tablet Analaya Hoey R, NP   predniSONE (DELTASONE) 50 MG tablet Take 1 tablet (50 mg total) by mouth daily with breakfast for 5 days. 5 tablet Valinda Hoar, NP      PDMP not reviewed this encounter.   Valinda Hoar, [place] 01/23/23 [phone]

## 2023-01-27 DIAGNOSIS — M791 Myalgia, unspecified site: Secondary | ICD-10-CM | POA: Diagnosis not present

## 2023-01-27 DIAGNOSIS — G43719 Chronic migraine without aura, intractable, without status migrainosus: Secondary | ICD-10-CM | POA: Diagnosis not present

## 2023-01-27 DIAGNOSIS — G518 Other disorders of facial nerve: Secondary | ICD-10-CM | POA: Diagnosis not present

## 2023-01-27 DIAGNOSIS — M542 Cervicalgia: Secondary | ICD-10-CM | POA: Diagnosis not present

## 2023-02-01 DIAGNOSIS — J209 Acute bronchitis, unspecified: Secondary | ICD-10-CM | POA: Diagnosis not present

## 2023-02-01 DIAGNOSIS — J208 Acute bronchitis due to other specified organisms: Secondary | ICD-10-CM | POA: Diagnosis not present

## 2023-02-07 DIAGNOSIS — R002 Palpitations: Secondary | ICD-10-CM | POA: Diagnosis not present

## 2023-02-07 DIAGNOSIS — I5020 Unspecified systolic (congestive) heart failure: Secondary | ICD-10-CM | POA: Diagnosis not present

## 2023-02-10 DIAGNOSIS — J208 Acute bronchitis due to other specified organisms: Secondary | ICD-10-CM | POA: Diagnosis not present

## 2023-02-10 DIAGNOSIS — J329 Chronic sinusitis, unspecified: Secondary | ICD-10-CM | POA: Diagnosis not present

## 2023-02-10 DIAGNOSIS — J411 Mucopurulent chronic bronchitis: Secondary | ICD-10-CM | POA: Diagnosis not present

## 2023-02-10 DIAGNOSIS — J432 Centrilobular emphysema: Secondary | ICD-10-CM | POA: Diagnosis not present

## 2023-02-10 DIAGNOSIS — I5020 Unspecified systolic (congestive) heart failure: Secondary | ICD-10-CM | POA: Diagnosis not present

## 2023-02-11 ENCOUNTER — Other Ambulatory Visit: Payer: Self-pay | Admitting: Internal Medicine

## 2023-02-11 DIAGNOSIS — J208 Acute bronchitis due to other specified organisms: Secondary | ICD-10-CM

## 2023-02-11 DIAGNOSIS — J432 Centrilobular emphysema: Secondary | ICD-10-CM

## 2023-02-21 DIAGNOSIS — R002 Palpitations: Secondary | ICD-10-CM | POA: Diagnosis not present

## 2023-02-21 DIAGNOSIS — E782 Mixed hyperlipidemia: Secondary | ICD-10-CM | POA: Diagnosis not present

## 2023-02-21 DIAGNOSIS — I5020 Unspecified systolic (congestive) heart failure: Secondary | ICD-10-CM | POA: Diagnosis not present

## 2023-02-22 DIAGNOSIS — H40031 Anatomical narrow angle, right eye: Secondary | ICD-10-CM | POA: Diagnosis not present

## 2023-02-24 ENCOUNTER — Ambulatory Visit
Admission: RE | Admit: 2023-02-24 | Discharge: 2023-02-24 | Disposition: A | Discharge status: Home / Self Care | Payer: No Typology Code available for payment source | Source: Ambulatory Visit | Attending: Internal Medicine | Admitting: Internal Medicine

## 2023-02-24 DIAGNOSIS — J208 Acute bronchitis due to other specified organisms: Secondary | ICD-10-CM | POA: Insufficient documentation

## 2023-02-24 DIAGNOSIS — J432 Centrilobular emphysema: Secondary | ICD-10-CM | POA: Insufficient documentation

## 2023-02-24 DIAGNOSIS — J439 Emphysema, unspecified: Secondary | ICD-10-CM | POA: Diagnosis not present

## 2023-02-24 DIAGNOSIS — R918 Other nonspecific abnormal finding of lung field: Secondary | ICD-10-CM | POA: Diagnosis not present

## 2023-02-24 DIAGNOSIS — I700 Atherosclerosis of aorta: Secondary | ICD-10-CM | POA: Diagnosis not present

## 2023-02-28 DIAGNOSIS — H40032 Anatomical narrow angle, left eye: Secondary | ICD-10-CM | POA: Diagnosis not present

## 2023-03-15 ENCOUNTER — Other Ambulatory Visit: Payer: Self-pay | Admitting: *Deleted

## 2023-03-15 DIAGNOSIS — F17210 Nicotine dependence, cigarettes, uncomplicated: Secondary | ICD-10-CM

## 2023-03-15 DIAGNOSIS — Z122 Encounter for screening for malignant neoplasm of respiratory organs: Secondary | ICD-10-CM

## 2023-03-15 DIAGNOSIS — Z87891 Personal history of nicotine dependence: Secondary | ICD-10-CM

## 2023-03-28 ENCOUNTER — Other Ambulatory Visit: Payer: Self-pay

## 2023-03-28 ENCOUNTER — Ambulatory Visit
Admission: EM | Admit: 2023-03-28 | Discharge: 2023-03-28 | Disposition: A | Discharge status: Home / Self Care | Payer: Medicare HMO | Attending: Emergency Medicine | Admitting: Emergency Medicine

## 2023-03-28 ENCOUNTER — Encounter: Payer: Self-pay | Admitting: Emergency Medicine

## 2023-03-28 DIAGNOSIS — J069 Acute upper respiratory infection, unspecified: Secondary | ICD-10-CM

## 2023-03-28 LAB — POCT RAPID STREP A (OFFICE): Rapid Strep A Screen: NEGATIVE

## 2023-03-28 LAB — POC COVID19/FLU A&B COMBO
Covid Antigen, POC: NEGATIVE
Influenza A Antigen, POC: NEGATIVE
Influenza B Antigen, POC: NEGATIVE

## 2023-03-28 MED ORDER — AZITHROMYCIN 250 MG PO TABS: 250.0000 mg | ORAL_TABLET | Freq: Every day | ORAL | 0 refills | Status: AC

## 2023-03-28 MED ORDER — PREDNISONE 10 MG (21) PO TBPK: ORAL_TABLET | Freq: Every day | ORAL | 0 refills | Status: AC

## 2023-03-28 NOTE — ED Triage Notes (Signed)
 Patient presents to [facility] for evaluation of dry cough, body aches, sore throat and nasal congestion since yesterday.  Patient states she had bronchitis in December and had to do three rounds of antibiotics.

## 2023-03-28 NOTE — ED Provider Notes (Signed)
 [name]    CSN: [id number] Arrival date & time: 03/28/23  0809      History   Chief Complaint Chief Complaint  Patient presents with   Sore Throat    HPI Jordan Ortega is a 68 y.o. female.   Patient presents for evaluation of chills, nasal congestion, dry hacking cough, sore throat and swollen glands 1 day ago.  Known sick contact in household.  Poor appetite but able to tolerate food and liquids.  Has attempted use of Mucinex.  History of COPD/bronchitis.  Denies shortness of breath, wheezing.   Past Medical History:  Diagnosis Date   Actinic keratosis    Adnexal mass    Anxiety    Barrett's esophagus    Bell palsy    left foot also ,    Bronchitis    Complication of anesthesia    difficulty waking up   COPD (chronic obstructive pulmonary disease) (HCC)    Degenerative disc disease, cervical    Depression    Dysplastic nevus 06/08/2019   Right lower abdomen, moderate atypia, limited margins free.   Dysplastic nevus 12/06/2018   right lower abdomen/moderate, limited margins free.   Environmental and seasonal allergies    GERD (gastroesophageal reflux disease)    Heart murmur    Hx   High triglycerides    Migraines    Migraines    Rapid heart rate     Patient Active Problem List   Diagnosis Date Noted   Age-related osteoporosis with current pathological fracture with routine healing 04/03/2021   Dysplastic nevus 06/08/2019   Abdominal pain 10/16/2018   Abnormal CT scan, colon 10/16/2018   Personal history of tobacco use, presenting hazards to health 10/03/2018   Arm pain 12/05/2017   Centrilobular emphysema (HCC) 12/04/2017   SOB (shortness of breath) 03/21/2017   Premature ventricular contractions 10/21/2016   Heart palpitations 10/05/2016   Cyst of right ovary 08/18/2016   Adnexal mass 08/17/2016   Bilateral carpal tunnel syndrome 04/08/2016   Bilateral hand pain 04/08/2016   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   Acute onset aura  migraine 07/10/2014   Bad memory 07/10/2014   Migraine without aura 07/10/2014   Abdominal pain, epigastric 07/10/2014   Chronic constipation 07/10/2014   Weakness of left side of body 11/28/2011   Acute anxiety 11/20/2009   Anxiety and depression 11/20/2009   Edema of extremities 11/20/2009   Hypercholesterolemia without hypertriglyceridemia 06/18/2009   Depletion of volume of extracellular fluid 06/16/2009   Malaise and fatigue 10/29/2008   Weight loss, unintentional 10/29/2008   Mixed hyperlipidemia 08/22/2008   Adaptive colitis 05/17/2008   Affective bipolar disorder (HCC) 05/17/2008   Barrett esophagus 05/17/2008    Past Surgical History:  Procedure Laterality Date   ABDOMINAL HYSTERECTOMY     APPENDECTOMY     CARDIAC CATHETERIZATION     [facility] pt doesn't have any stents or recall years ago 20+ yrs ago   CHOLECYSTECTOMY     COLONOSCOPY WITH PROPOFOL N/A 07/16/2016   Procedure: COLONOSCOPY WITH PROPOFOL;  Surgeon: Wyline Mood, MD;  Location: [facility] ENDOSCOPY;  Service: Endoscopy;  Laterality: N/A;   COLONOSCOPY WITH PROPOFOL N/A 10/20/2018   Procedure: COLONOSCOPY WITH PROPOFOL;  Surgeon: Wyline Mood, MD;  Location: [facility] ENDOSCOPY;  Service: Gastroenterology;  Laterality: N/A;   COLONOSCOPY WITH PROPOFOL N/A 07/29/2021   Procedure: COLONOSCOPY WITH PROPOFOL;  Surgeon: Wyline Mood, MD;  Location: [facility] ENDOSCOPY;  Service: Gastroenterology;  Laterality: N/A;   DIAGNOSTIC LAPAROSCOPY     ESOPHAGOGASTRODUODENOSCOPY  N/A 07/29/2021   Procedure: ESOPHAGOGASTRODUODENOSCOPY (EGD);  Surgeon: Wyline Mood, MD;  Location: [facility] ENDOSCOPY;  Service: Gastroenterology;  Laterality: N/A;   ESOPHAGOGASTRODUODENOSCOPY (EGD) WITH PROPOFOL N/A 07/16/2016   Procedure: ESOPHAGOGASTRODUODENOSCOPY (EGD) WITH PROPOFOL;  Surgeon: Wyline Mood, MD;  Location: [facility] ENDOSCOPY;  Service: Endoscopy;  Laterality: N/A;   ESOPHAGOGASTRODUODENOSCOPY (EGD) WITH PROPOFOL N/A 12/05/2018   Procedure:  ESOPHAGOGASTRODUODENOSCOPY (EGD) WITH PROPOFOL;  Surgeon: Wyline Mood, MD;  Location: [facility] ENDOSCOPY;  Service: Gastroenterology;  Laterality: N/A;   GALLBLADDER SURGERY  1980   GIVENS CAPSULE STUDY N/A 12/21/2018   Procedure: GIVENS CAPSULE STUDY;  Surgeon: Wyline Mood, MD;  Location: [facility] ENDOSCOPY;  Service: Gastroenterology;  Laterality: N/A;   LAPAROSCOPIC SALPINGO OOPHERECTOMY Bilateral 08/27/2016   Procedure: LAPAROSCOPIC SALPINGO OOPHORECTOMY;  Surgeon: Nadara Mustard, MD;  Location: [facility] ORS;  Service: Gynecology;  Laterality: Bilateral;   LEFT HEART CATH AND CORONARY ANGIOGRAPHY Left 10/20/2022   Procedure: LEFT HEART CATH AND CORONARY ANGIOGRAPHY;  Surgeon: Alwyn Pea, MD;  Location: [facility] INVASIVE CV LAB;  Service: Cardiovascular;  Laterality: Left;   TUBAL LIGATION  1983    OB History     Gravida  5   Para  3   Term  3   Preterm      AB  2   Living  3      SAB      IAB  2   Ectopic      Multiple      Live Births               Home Medications    Prior to Admission medications   Medication Sig Start Date End Date Taking? Authorizing Provider  azithromycin (ZITHROMAX) 250 MG tablet Take 1 tablet (250 mg total) by mouth daily. Take first 2 tablets together, then 1 every day until finished. 03/28/23  Yes Shaunta Oncale R, NP  predniSONE (STERAPRED UNI-PAK 21 TAB) 10 MG (21) TBPK tablet Take by mouth daily. Take 6 tabs by mouth daily  for 1 days, then 5 tabs for 1 days, then 4 tabs for 1 days, then 3 tabs for 1 days, 2 tabs for 1 days, then 1 tab by mouth daily for 1 days 03/28/23  Yes Nanea Jared R, NP  AIMOVIG 140 MG/ML SOAJ Inject into the skin. Patient not taking: Reported on 10/20/2022 04/01/21   [provider]  albuterol (PROVENTIL HFA;VENTOLIN HFA) 108 (90 Base) MCG/ACT inhaler Inhale 2 puffs into the lungs every 6 (six) hours as needed for wheezing or shortness of breath. 11/01/17   Chrismon, Jodell Cipro, PA-C  ALPRAZolam (XANAX) 0.5 MG  tablet Take 1 tablet (0.5 mg total) by mouth 3 (three) times daily as needed for anxiety. 11/19/16   Chrismon, Jodell Cipro, PA-C  baclofen (LIORESAL) 10 MG tablet Take 1 tablet (10 mg total) by mouth 3 (three) times daily. Patient not taking: Reported on 10/20/2022 04/21/20   Margaretann Loveless, PA-C  budesonide-formoterol [facility]) 160-4.5 MCG/ACT inhaler Inhale 1 puff into the lungs 2 (two) times daily. Patient not taking: Reported on 10/20/2022 03/21/17   Chrismon, Jodell Cipro, PA-C  butalbital-acetaminophen-caffeine (FIORICET) 50-325-40 MG tablet Take 1 tablet by mouth daily as needed. Patient not taking: Reported on 10/20/2022 12/15/21   [provider]  calcium carbonate (CALCIUM 600) 600 MG TABS tablet Take 600 mg by mouth 2 (two) times daily with a meal.    [provider]  CHOLECALCIFEROL PO Take by mouth. 1000 units    [provider]  clobetasol ointment (TEMOVATE) 0.05 % Apply 1 Application topically 2 (two) times daily. To affected area rash. Avoid applying to face, groin, and axilla. Use as directed. Long-term use can cause thinning of the skin. 09/10/21   Moye, [place], MD  cyanocobalamin 1000 MCG tablet Take 1,000 mcg by mouth daily.    [provider]  cyclobenzaprine (FLEXERIL) 5 MG tablet Take 1 tablet (5 mg total) by mouth 3 (three) times daily as needed for muscle spasms. Patient not taking: Reported on 10/20/2022 04/21/20   Margaretann Loveless, PA-C  dicyclomine (BENTYL) 20 MG tablet Take 1 tablet by mouth every 6 (six) hours. 06/04/21 06/04/22  [provider]  diphenhydrAMINE (BENADRYL) 50 MG capsule 1 capsule 1 hour prior to [facility] 05/01/21   [provider]  DULoxetine (CYMBALTA) 60 MG capsule Take 60 mg by mouth daily.    [provider]  Fluticasone-Umeclidin-Vilant (TRELEGY ELLIPTA) 100-62.5-25 MCG/ACT AEPB Inhale into the lungs.    [provider]  HYDROcodone-acetaminophen (NORCO/VICODIN) 5-325 MG tablet Take 1  tablet by mouth every 4 (four) hours as needed for moderate pain or severe pain. 04/21/20   Margaretann Loveless, PA-C  HYDROCODONE-CHLORPHENIRAMINE PO Take by mouth.    [provider]  levofloxacin (LEVAQUIN) 500 MG tablet Take 1 tablet (500 mg total) by mouth daily. 01/22/23   Grason Brailsford, Elita Boone, NP  loratadine (CLARITIN) 10 MG tablet Take by mouth.    [provider]  losartan (COZAAR) 25 MG tablet Take 12.5 mg by mouth daily.    [provider]  magnesium gluconate (MAGONATE) 500 MG tablet Take by mouth.    [provider]  meloxicam (MOBIC) 15 MG tablet Take 1 tablet by mouth daily. Patient not taking: Reported on 10/20/2022 03/25/21   [provider]  METAMUCIL FIBER PO Take by mouth. Patient not taking: Reported on 10/20/2022    [provider]  mirtazapine (REMERON) 30 MG tablet Take 30 mg by mouth at bedtime. 04/30/21   [provider]  Multiple Vitamin (MULTIVITAMIN) tablet Take 1 tablet by mouth daily.    [provider]  Multiple Vitamins-Minerals (HAIR SKIN & NAILS PO) Take by mouth. Patient not taking: Reported on 10/20/2022    [provider]  Multiple Vitamins-Minerals (PRESERVISION AREDS 2) CAPS Take 2 1e11 Vector Genomes by mouth.    [provider]  omeprazole (PRILOSEC) 40 MG capsule Take 1 capsule (40 mg total) by mouth in the morning and at bedtime. Patient not taking: Reported on 10/20/2022 07/30/21   Wyline Mood, MD  Pantoprazole Sodium (PROTONIX PO) Take 40 mg by mouth.    [provider]  promethazine (PHENERGAN) 25 MG tablet Take 1 tablet (25 mg total) by mouth every 8 (eight) hours as needed for nausea or vomiting. 10/17/18   Margaretann Loveless, PA-C  simvastatin (ZOCOR) 20 MG tablet TAKE ONE TABLET BY MOUTH AT BEDTIME 02/08/20   Margaretann Loveless, PA-C  sucralfate (CARAFATE) 1 g tablet TAKE 1 TABLET BY MOUTH 4 TIMES DAILY Patient not taking: Reported on 10/20/2022 08/26/21    Wyline Mood, MD  traZODone (DESYREL) 50 MG tablet Take 50 mg by mouth at bedtime.    [provider]  Ubrogepant (UBRELVY) 100 MG TABS Take by mouth. Patient not taking: Reported on 10/20/2022    [provider]    Family History Family History  Problem Relation Age of Onset   Breast cancer Mother 44   Skin cancer Mother  Hypothyroidism Mother    Parkinson's disease Mother    Osteoporosis Mother    Colon cancer Father    Lung cancer Father    Heart disease Father    Migraines Sister    Breast cancer Maternal Aunt        post men.   Diabetes Paternal Uncle    Diabetes Paternal Grandmother    Hypertension Paternal Grandmother    Stroke Paternal Grandmother    Dementia Paternal Grandfather    Breast cancer Cousin 80       maternal    Multiple myeloma Cousin     Social History Social History   Tobacco Use   Smoking status: Every Day    Current packs/day: 1.00    Average packs/day: 1 pack/day for 46.0 years (46.0 ttl pk-yrs)    Types: Cigarettes   Smokeless tobacco: Never   Tobacco comments:    .5ppd currently    04/06/2021- Patient states she has cut back to 1 pack a day   Vaping Use   Vaping status: Never Used  Substance Use Topics   Alcohol use: Not Currently    Alcohol/week: 0.0 standard drinks of alcohol   Drug use: No     Allergies   Diazepam, Ciprofloxacin, Codeine, Dexamethasone, Metoprolol, Morphine, Niacin and related, Topiramate, and Doxycycline   Review of Systems Review of Systems   Physical Exam Triage Vital Signs ED Triage Vitals  Encounter Vitals Group     BP 03/28/23 0821 118/78     Systolic BP Percentile --      Diastolic BP Percentile --      Pulse Rate 03/28/23 0821 73     Resp 03/28/23 0821 18     Temp 03/28/23 0821 98.9 F (37.2 C)     Temp Source 03/28/23 0821 Oral     SpO2 03/28/23 0821 96 %     Weight --      Height --      Head Circumference --      Peak Flow --      Pain Score 03/28/23 0819 3     Pain  Loc --      Pain Education --      Exclude from Growth Chart --    No data found.  Updated Vital Signs BP 118/78 (BP Location: Left Arm)   Pulse 73   Temp 98.9 F (37.2 C) (Oral)   Resp 18   SpO2 96%   Visual Acuity Right Eye Distance:   Left Eye Distance:   Bilateral Distance:    Right Eye Near:   Left Eye Near:    Bilateral Near:     Physical Exam Constitutional:      Appearance: Normal appearance.  HENT:     Head: Normocephalic.     Right Ear: Tympanic membrane, ear canal and external ear normal.     Left Ear: Tympanic membrane, ear canal and external ear normal.     Nose: Congestion present.     Mouth/Throat:     Mouth: Mucous membranes are moist.     Pharynx: Oropharynx is clear.  Eyes:     Extraocular Movements: Extraocular movements intact.  Cardiovascular:     Rate and Rhythm: Normal rate and regular rhythm.     Pulses: Normal pulses.     Heart sounds: Normal heart sounds.  Pulmonary:     Effort: Pulmonary effort is normal.     Comments: Wheezing present to the right upper lobes, remaining lobes clear Neurological:  Mental Status: She is alert and oriented to person, place, and time. Mental status is at baseline.      UC Treatments / Results  Labs (all labs ordered are listed, but only abnormal results are displayed) Labs Reviewed  POCT RAPID STREP A (OFFICE) - Normal  POC COVID19/FLU A&B COMBO - Normal    EKG   Radiology No results found.  Procedures Procedures (including critical care time)  Medications Ordered in UC Medications - No data to display  Initial Impression / Assessment and Plan / UC Course  I have reviewed the triage vital signs and the nursing notes.  Pertinent labs & imaging results that were available during my care of the patient were reviewed by me and considered in my medical decision making (see chart for details).  Viral URI with cough  Patient is in no signs of distress nor toxic appearing.  Vital signs are  stable.  Low suspicion for pneumonia, pneumothorax or bronchitis and therefore will defer imaging.  COVID, flu and strep test negative.  Symptoms typically progressed to bronchitis therefore prophylactically placed on a Z-Pak.  Prescribed prednisone and endorses that she has Hycodan or similar product at home for cough.May use additional over-the-counter medications as needed for supportive care.  May follow-up with urgent care as needed if symptoms persist or worsen.   Final Clinical Impressions(s) / UC Diagnoses   Final diagnoses:  Viral URI with cough     Discharge Instructions      Your symptoms today are most likely being caused by a virus and should steadily improve in time it can take up to 7 to 10 days before you truly start to see a turnaround however things will get better, has not placed on antibiotic prophylactically, take azithromycin as directed  Begin prednisone every morning with food to open and relax the airway making it easier to breathe    You can take Tylenol and/or Ibuprofen as needed for fever reduction and pain relief.   For cough: honey 1/2 to 1 teaspoon (you can dilute the honey in water or another fluid).  You can also use guaifenesin and dextromethorphan for cough. You can use a humidifier for chest congestion and cough.  If you don't have a humidifier, you can sit in the bathroom with the hot shower running.      For sore throat: try warm salt water gargles, cepacol lozenges, throat spray, warm tea or water with lemon/honey, popsicles or ice, or OTC cold relief medicine for throat discomfort.   For congestion: take a daily anti-histamine like Zyrtec, Claritin, and a oral decongestant, such as pseudoephedrine.  You can also use Flonase 1-2 sprays in each nostril daily.   It is important to stay hydrated: drink plenty of fluids (water, gatorade/powerade/pedialyte, juices, or teas) to keep your throat moisturized and help further relieve  irritation/discomfort.    ED Prescriptions     Medication Sig Dispense Auth. Provider   azithromycin (ZITHROMAX) 250 MG tablet Take 1 tablet (250 mg total) by mouth daily. Take first 2 tablets together, then 1 every day until finished. 6 tablet Rynell Ciotti R, NP   predniSONE (STERAPRED UNI-PAK 21 TAB) 10 MG (21) TBPK tablet Take by mouth daily. Take 6 tabs by mouth daily  for 1 days, then 5 tabs for 1 days, then 4 tabs for 1 days, then 3 tabs for 1 days, 2 tabs for 1 days, then 1 tab by mouth daily for 1 days 21 tablet Kambrie Eddleman, Elita Boone, NP  PDMP not reviewed this encounter.   Valinda Hoar, [place] 03/28/23 [phone]

## 2023-03-28 NOTE — Discharge Instructions (Signed)
 Your symptoms today are most likely being caused by a virus and should steadily improve in time it can take up to 7 to 10 days before you truly start to see a turnaround however things will get better, has not placed on antibiotic prophylactically, take azithromycin as directed  Begin prednisone every morning with food to open and relax the airway making it easier to breathe    You can take Tylenol and/or Ibuprofen as needed for fever reduction and pain relief.   For cough: honey 1/2 to 1 teaspoon (you can dilute the honey in water or another fluid).  You can also use guaifenesin and dextromethorphan for cough. You can use a humidifier for chest congestion and cough.  If you don't have a humidifier, you can sit in the bathroom with the hot shower running.      For sore throat: try warm salt water gargles, cepacol lozenges, throat spray, warm tea or water with lemon/honey, popsicles or ice, or OTC cold relief medicine for throat discomfort.   For congestion: take a daily anti-histamine like Zyrtec, Claritin, and a oral decongestant, such as pseudoephedrine.  You can also use Flonase 1-2 sprays in each nostril daily.   It is important to stay hydrated: drink plenty of fluids (water, gatorade/powerade/pedialyte, juices, or teas) to keep your throat moisturized and help further relieve irritation/discomfort.

## 2023-05-03 DIAGNOSIS — M542 Cervicalgia: Secondary | ICD-10-CM | POA: Diagnosis not present

## 2023-05-03 DIAGNOSIS — G518 Other disorders of facial nerve: Secondary | ICD-10-CM | POA: Diagnosis not present

## 2023-05-03 DIAGNOSIS — M791 Myalgia, unspecified site: Secondary | ICD-10-CM | POA: Diagnosis not present

## 2023-05-03 DIAGNOSIS — G43719 Chronic migraine without aura, intractable, without status migrainosus: Secondary | ICD-10-CM | POA: Diagnosis not present

## 2023-05-11 DIAGNOSIS — G43019 Migraine without aura, intractable, without status migrainosus: Secondary | ICD-10-CM | POA: Diagnosis not present

## 2023-05-11 DIAGNOSIS — R0781 Pleurodynia: Secondary | ICD-10-CM | POA: Diagnosis not present

## 2023-05-11 DIAGNOSIS — J432 Centrilobular emphysema: Secondary | ICD-10-CM | POA: Diagnosis not present

## 2023-06-02 DIAGNOSIS — M5416 Radiculopathy, lumbar region: Secondary | ICD-10-CM | POA: Diagnosis not present

## 2023-06-02 DIAGNOSIS — M7918 Myalgia, other site: Secondary | ICD-10-CM | POA: Diagnosis not present

## 2023-06-02 DIAGNOSIS — M48062 Spinal stenosis, lumbar region with neurogenic claudication: Secondary | ICD-10-CM | POA: Diagnosis not present

## 2023-06-07 DIAGNOSIS — J449 Chronic obstructive pulmonary disease, unspecified: Secondary | ICD-10-CM | POA: Diagnosis not present

## 2023-06-07 DIAGNOSIS — G518 Other disorders of facial nerve: Secondary | ICD-10-CM | POA: Diagnosis not present

## 2023-06-07 DIAGNOSIS — M791 Myalgia, unspecified site: Secondary | ICD-10-CM | POA: Diagnosis not present

## 2023-06-07 DIAGNOSIS — F17218 Nicotine dependence, cigarettes, with other nicotine-induced disorders: Secondary | ICD-10-CM | POA: Diagnosis not present

## 2023-06-07 DIAGNOSIS — G43719 Chronic migraine without aura, intractable, without status migrainosus: Secondary | ICD-10-CM | POA: Diagnosis not present

## 2023-06-07 DIAGNOSIS — M542 Cervicalgia: Secondary | ICD-10-CM | POA: Diagnosis not present

## 2023-07-06 DIAGNOSIS — M791 Myalgia, unspecified site: Secondary | ICD-10-CM | POA: Diagnosis not present

## 2023-07-06 DIAGNOSIS — G518 Other disorders of facial nerve: Secondary | ICD-10-CM | POA: Diagnosis not present

## 2023-07-06 DIAGNOSIS — M542 Cervicalgia: Secondary | ICD-10-CM | POA: Diagnosis not present

## 2023-07-06 DIAGNOSIS — G43719 Chronic migraine without aura, intractable, without status migrainosus: Secondary | ICD-10-CM | POA: Diagnosis not present

## 2023-07-14 DIAGNOSIS — E782 Mixed hyperlipidemia: Secondary | ICD-10-CM | POA: Diagnosis not present

## 2023-07-14 DIAGNOSIS — R002 Palpitations: Secondary | ICD-10-CM | POA: Diagnosis not present

## 2023-07-14 DIAGNOSIS — I5020 Unspecified systolic (congestive) heart failure: Secondary | ICD-10-CM | POA: Diagnosis not present

## 2023-07-28 ENCOUNTER — Ambulatory Visit
Admission: EM | Admit: 2023-07-28 | Discharge: 2023-07-28 | Disposition: A | Discharge status: Home / Self Care | Attending: Emergency Medicine | Admitting: Emergency Medicine

## 2023-07-28 ENCOUNTER — Encounter: Payer: Self-pay | Admitting: Emergency Medicine

## 2023-07-28 DIAGNOSIS — G43011 Migraine without aura, intractable, with status migrainosus: Secondary | ICD-10-CM

## 2023-07-28 MED ORDER — ONDANSETRON 4 MG PO TBDP
4.0000 mg | ORAL_TABLET | Freq: Once | ORAL | Status: AC
  Administered 2023-07-28: 4 mg via ORAL

## 2023-07-28 MED ORDER — DIPHENHYDRAMINE HCL 25 MG PO CAPS
25.0000 mg | ORAL_CAPSULE | Freq: Once | ORAL | Status: AC
  Administered 2023-07-28: 25 mg via ORAL

## 2023-07-28 MED ORDER — KETOROLAC TROMETHAMINE 30 MG/ML IJ SOLN
30.0000 mg | Freq: Once | INTRAMUSCULAR | Status: AC
  Administered 2023-07-28: 30 mg via INTRAMUSCULAR

## 2023-07-28 NOTE — Discharge Instructions (Signed)
 For your headache -On exam there are no abnormalities neurologically -You have been given an injection of Toradol, as Benadryl and Zofran here in the office today to help minimize your symptoms -You may continue use of your normal medications for management at home -While headaches are present ensure that you are getting adequate rest and adequate fluid intake -Participate in low stimulation activities avoiding bright lights and loud noises when symptoms are present -If your headaches continue to persist please follow-up with your primary doctor for reevaluation -At any point if you have the worst headache of your life please

## 2023-07-28 NOTE — ED Provider Notes (Signed)
 [name]    CSN: [id number] Arrival date & time: 07/28/23  1044      History   Chief Complaint Chief Complaint  Patient presents with   Migraine    HPI Jordan Ortega is a 68 y.o. female.   Patient presents for evaluation of her right sided frontal headache beginning this morning at 5 AM upon awakening.  It has been constant, does not radiate, associated light and noise sensitivity and nausea without vomiting.  Has chronic migraines, typical presentation.  Attempted use of Norco which has been ineffective.  Denies dizziness, lightheadedness, vision changes, syncope, memory or speech changes or weakness.  Past Medical History:  Diagnosis Date   Actinic keratosis    Adnexal mass    Anxiety    Barrett's esophagus    Bell palsy    left foot also ,    Bronchitis    Complication of anesthesia    difficulty waking up   COPD (chronic obstructive pulmonary disease) (HCC)    Degenerative disc disease, cervical    Depression    Dysplastic nevus 06/08/2019   Right lower abdomen, moderate atypia, limited margins free.   Dysplastic nevus 12/06/2018   right lower abdomen/moderate, limited margins free.   Environmental and seasonal allergies    GERD (gastroesophageal reflux disease)    Heart murmur    Hx   High triglycerides    Migraines    Migraines    Rapid heart rate     Patient Active Problem List   Diagnosis Date Noted   Age-related osteoporosis with current pathological fracture with routine healing 04/03/2021   Dysplastic nevus 06/08/2019   Abdominal pain 10/16/2018   Abnormal CT scan, colon 10/16/2018   Personal history of tobacco use, presenting hazards to health 10/03/2018   Arm pain 12/05/2017   Centrilobular emphysema (HCC) 12/04/2017   SOB (shortness of breath) 03/21/2017   Premature ventricular contractions 10/21/2016   Heart palpitations 10/05/2016   Cyst of right ovary 08/18/2016   Adnexal mass 08/17/2016   Bilateral carpal tunnel  syndrome 04/08/2016   Bilateral hand pain 04/08/2016   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   Acute onset aura migraine 07/10/2014   Bad memory 07/10/2014   Migraine without aura 07/10/2014   Abdominal pain, epigastric 07/10/2014   Chronic constipation 07/10/2014   Weakness of left side of body 11/28/2011   Acute anxiety 11/20/2009   Anxiety and depression 11/20/2009   Edema of extremities 11/20/2009   Hypercholesterolemia without hypertriglyceridemia 06/18/2009   Depletion of volume of extracellular fluid 06/16/2009   Malaise and fatigue 10/29/2008   Weight loss, unintentional 10/29/2008   Mixed hyperlipidemia 08/22/2008   Adaptive colitis 05/17/2008   Affective bipolar disorder (HCC) 05/17/2008   Barrett esophagus 05/17/2008    Past Surgical History:  Procedure Laterality Date   ABDOMINAL HYSTERECTOMY     APPENDECTOMY     CARDIAC CATHETERIZATION     [facility] pt doesn't have any stents or recall years ago 20+ yrs ago   CHOLECYSTECTOMY     COLONOSCOPY WITH PROPOFOL  N/A 07/16/2016   Procedure: COLONOSCOPY WITH PROPOFOL ;  Surgeon: Therisa Bi, MD;  Location: [facility] ENDOSCOPY;  Service: Endoscopy;  Laterality: N/A;   COLONOSCOPY WITH PROPOFOL  N/A 10/20/2018   Procedure: COLONOSCOPY WITH PROPOFOL ;  Surgeon: Therisa Bi, MD;  Location: [facility] ENDOSCOPY;  Service: Gastroenterology;  Laterality: N/A;   COLONOSCOPY WITH PROPOFOL  N/A 07/29/2021   Procedure: COLONOSCOPY WITH PROPOFOL ;  Surgeon: Therisa Bi, MD;  Location: [facility] ENDOSCOPY;  Service: Gastroenterology;  Laterality:  N/A;   DIAGNOSTIC LAPAROSCOPY     ESOPHAGOGASTRODUODENOSCOPY N/A 07/29/2021   Procedure: ESOPHAGOGASTRODUODENOSCOPY (EGD);  Surgeon: Therisa Bi, MD;  Location: [facility] ENDOSCOPY;  Service: Gastroenterology;  Laterality: N/A;   ESOPHAGOGASTRODUODENOSCOPY (EGD) WITH PROPOFOL  N/A 07/16/2016   Procedure: ESOPHAGOGASTRODUODENOSCOPY (EGD) WITH PROPOFOL ;  Surgeon: Therisa Bi, MD;  Location: [facility] ENDOSCOPY;  Service: Endoscopy;  Laterality:  N/A;   ESOPHAGOGASTRODUODENOSCOPY (EGD) WITH PROPOFOL  N/A 12/05/2018   Procedure: ESOPHAGOGASTRODUODENOSCOPY (EGD) WITH PROPOFOL ;  Surgeon: Therisa Bi, MD;  Location: [facility] ENDOSCOPY;  Service: Gastroenterology;  Laterality: N/A;   GALLBLADDER SURGERY  1980   GIVENS CAPSULE STUDY N/A 12/21/2018   Procedure: GIVENS CAPSULE STUDY;  Surgeon: Therisa Bi, MD;  Location: [facility] ENDOSCOPY;  Service: Gastroenterology;  Laterality: N/A;   LAPAROSCOPIC SALPINGO OOPHERECTOMY Bilateral 08/27/2016   Procedure: LAPAROSCOPIC SALPINGO OOPHORECTOMY;  Surgeon: Arloa Lamar SQUIBB, MD;  Location: [facility] ORS;  Service: Gynecology;  Laterality: Bilateral;   LEFT HEART CATH AND CORONARY ANGIOGRAPHY Left 10/20/2022   Procedure: LEFT HEART CATH AND CORONARY ANGIOGRAPHY;  Surgeon: Florencio Cara BIRCH, MD;  Location: [facility] INVASIVE CV LAB;  Service: Cardiovascular;  Laterality: Left;   TUBAL LIGATION  1983    OB History     Gravida  5   Para  3   Term  3   Preterm      AB  2   Living  3      SAB      IAB  2   Ectopic      Multiple      Live Births               Home Medications    Prior to Admission medications   Medication Sig Start Date End Date Taking? Authorizing Provider  AIMOVIG  140 MG/ML SOAJ Inject into the skin. Patient not taking: Reported on 10/20/2022 04/01/21   [provider]  albuterol  (PROVENTIL  HFA;VENTOLIN  HFA) 108 (90 Base) MCG/ACT inhaler Inhale 2 puffs into the lungs every 6 (six) hours as needed for wheezing or shortness of breath. 11/01/17   Chrismon, Marinda BRAVO, PA-C  ALPRAZolam  (XANAX ) 0.5 MG tablet Take 1 tablet (0.5 mg total) by mouth 3 (three) times daily as needed for anxiety. 11/19/16   Chrismon, Marinda BRAVO, PA-C  azithromycin  (ZITHROMAX ) 250 MG tablet Take 1 tablet (250 mg total) by mouth daily. Take first 2 tablets together, then 1 every day until finished. 03/28/23   Teresa Shelba SAUNDERS, NP  baclofen  (LIORESAL ) 10 MG tablet Take 1 tablet (10 mg total) by mouth 3  (three) times daily. Patient not taking: Reported on 10/20/2022 04/21/20   Burnette, Jennifer M, PA-C  budesonide -formoterol  (SYMBICORT ) 160-4.5 MCG/ACT inhaler Inhale 1 puff into the lungs 2 (two) times daily. Patient not taking: Reported on 10/20/2022 03/21/17   Chrismon, Marinda BRAVO, PA-C  butalbital-acetaminophen -caffeine (FIORICET) 50-325-40 MG tablet Take 1 tablet by mouth daily as needed. Patient not taking: Reported on 10/20/2022 12/15/21   [provider]  calcium carbonate (CALCIUM 600) 600 MG TABS tablet Take 600 mg by mouth 2 (two) times daily with a meal.    [provider]  CHOLECALCIFEROL PO Take by mouth. 1000 units    [provider]  clobetasol  ointment (TEMOVATE ) 0.05 % Apply 1 Application topically 2 (two) times daily. To affected area rash. Avoid applying to face, groin, and axilla. Use as directed. Long-term use can cause thinning of the skin. 09/10/21   Moye, [place] , MD  cyanocobalamin  1000 MCG tablet Take 1,000 mcg by mouth daily.  [provider]  cyclobenzaprine  (FLEXERIL ) 5 MG tablet Take 1 tablet (5 mg total) by mouth 3 (three) times daily as needed for muscle spasms. Patient not taking: Reported on 10/20/2022 04/21/20   Burnette, Jennifer M, PA-C  dicyclomine  (BENTYL ) 20 MG tablet Take 1 tablet by mouth every 6 (six) hours. 06/04/21 06/04/22  [provider]  diphenhydrAMINE  (BENADRYL ) 50 MG capsule 1 capsule 1 hour prior to [facility] 05/01/21   [provider]  DULoxetine  (CYMBALTA ) 60 MG capsule Take 60 mg by mouth daily.    [provider]  Fluticasone -Umeclidin-Vilant (TRELEGY ELLIPTA) 100-62.5-25 MCG/ACT AEPB Inhale into the lungs.    [provider]  HYDROcodone -acetaminophen  (NORCO/VICODIN) 5-325 MG tablet Take 1 tablet by mouth every 4 (four) hours as needed for moderate pain or severe pain. 04/21/20   Burnette, Jennifer M, PA-C  HYDROCODONE -CHLORPHENIRAMINE PO Take by mouth.    [provider]   levofloxacin  (LEVAQUIN ) 500 MG tablet Take 1 tablet (500 mg total) by mouth daily. 01/22/23   Adriella Essex, Shelba SAUNDERS, NP  loratadine (CLARITIN) 10 MG tablet Take by mouth.    [provider]  losartan (COZAAR) 25 MG tablet Take 12.5 mg by mouth daily.    [provider]  magnesium gluconate (MAGONATE) 500 MG tablet Take by mouth.    [provider]  meloxicam  (MOBIC ) 15 MG tablet Take 1 tablet by mouth daily. Patient not taking: Reported on 10/20/2022 03/25/21   [provider]  METAMUCIL FIBER PO Take by mouth. Patient not taking: Reported on 10/20/2022    [provider]  mirtazapine  (REMERON ) 30 MG tablet Take 30 mg by mouth at bedtime. 04/30/21   [provider]  Multiple Vitamin (MULTIVITAMIN) tablet Take 1 tablet by mouth daily.    [provider]  Multiple Vitamins-Minerals (HAIR SKIN & NAILS PO) Take by mouth. Patient not taking: Reported on 10/20/2022    [provider]  Multiple Vitamins-Minerals (PRESERVISION AREDS 2) CAPS Take 2 1e11 Vector Genomes by mouth.    [provider]  omeprazole  (PRILOSEC) 40 MG capsule Take 1 capsule (40 mg total) by mouth in the morning and at bedtime. Patient not taking: Reported on 10/20/2022 07/30/21   Therisa Bi, MD  Pantoprazole  Sodium (PROTONIX  PO) Take 40 mg by mouth.    [provider]  predniSONE  (STERAPRED UNI-PAK 21 TAB) 10 MG (21) TBPK tablet Take by mouth daily. Take 6 tabs by mouth daily  for 1 days, then 5 tabs for 1 days, then 4 tabs for 1 days, then 3 tabs for 1 days, 2 tabs for 1 days, then 1 tab by mouth daily for 1 days 03/28/23   Teresa Shelba SAUNDERS, NP  promethazine  (PHENERGAN ) 25 MG tablet Take 1 tablet (25 mg total) by mouth every 8 (eight) hours as needed for nausea or vomiting. 10/17/18   Vivienne Delon HERO, PA-C  simvastatin  (ZOCOR ) 20 MG tablet TAKE ONE TABLET BY MOUTH AT BEDTIME 02/08/20   Vivienne Delon HERO, PA-C  sucralfate  (CARAFATE ) 1 g tablet  TAKE 1 TABLET BY MOUTH 4 TIMES DAILY Patient not taking: Reported on 10/20/2022 08/26/21   Therisa Bi, MD  traZODone  (DESYREL ) 50 MG tablet Take 50 mg by mouth at bedtime.    [provider]  Ubrogepant (UBRELVY) 100 MG TABS Take by mouth. Patient not taking: Reported on 10/20/2022    [provider]    Family History Family History  Problem Relation Age of Onset   Breast cancer Mother 66  Skin cancer Mother    Hypothyroidism Mother    Parkinson's disease Mother    Osteoporosis Mother    Colon cancer Father    Lung cancer Father    Heart disease Father    Migraines Sister    Breast cancer Maternal Aunt        post men.   Diabetes Paternal Uncle    Diabetes Paternal Grandmother    Hypertension Paternal Grandmother    Stroke Paternal Grandmother    Dementia Paternal Grandfather    Breast cancer Cousin 18       maternal    Multiple myeloma Cousin     Social History Social History   Tobacco Use   Smoking status: Every Day    Current packs/day: 1.00    Average packs/day: 1 pack/day for 46.0 years (46.0 ttl pk-yrs)    Types: Cigarettes   Smokeless tobacco: Never   Tobacco comments:    .5ppd currently    04/06/2021- Patient states she has cut back to 1 pack a day   Vaping Use   Vaping status: Never Used  Substance Use Topics   Alcohol use: Not Currently    Alcohol/week: 0.0 standard drinks of alcohol   Drug use: No     Allergies   Diazepam, Ciprofloxacin, Codeine, Dexamethasone , Metoprolol, Morphine , Niacin and related, Topiramate , and Doxycycline    Review of Systems Review of Systems   Physical Exam Triage Vital Signs ED Triage Vitals  Encounter Vitals Group     BP 07/28/23 1050 108/74     Girls Systolic BP Percentile --      Girls Diastolic BP Percentile --      Boys Systolic BP Percentile --      Boys Diastolic BP Percentile --      Pulse Rate 07/28/23 1050 67     Resp 07/28/23 1050 18     Temp 07/28/23 1050 98.3 F (36.8 C)     Temp  Source 07/28/23 1050 Oral     SpO2 07/28/23 1050 98 %     Weight --      Height --      Head Circumference --      Peak Flow --      Pain Score 07/28/23 1052 9     Pain Loc --      Pain Education --      Exclude from Growth Chart --    No data found.  Updated Vital Signs BP 108/74 (BP Location: Left Arm)   Pulse 67   Temp 98.3 F (36.8 C) (Oral)   Resp 18   SpO2 98%   Visual Acuity Right Eye Distance:   Left Eye Distance:   Bilateral Distance:    Right Eye Near:   Left Eye Near:    Bilateral Near:     Physical Exam Constitutional:      Appearance: Normal appearance.   Eyes:     Extraocular Movements: Extraocular movements intact.     Conjunctiva/sclera: Conjunctivae normal.     Pupils: Pupils are equal, round, and reactive to light.   Pulmonary:     Effort: Pulmonary effort is normal.   Neurological:     General: No focal deficit present.     Mental Status: She is alert and oriented to person, place, and time. Mental status is at baseline.     Cranial Nerves: No cranial nerve deficit.     Sensory: No sensory deficit.     Motor: No weakness.  Coordination: Coordination normal.     Gait: Gait normal.      UC Treatments / Results  Labs (all labs ordered are listed, but only abnormal results are displayed) Labs Reviewed - No data to display  EKG   Radiology No results found.  Procedures Procedures (including critical care time)  Medications Ordered in UC Medications  ketorolac  (TORADOL ) 30 MG/ML injection 30 mg (30 mg Intramuscular Given 07/28/23 1109)  ondansetron  (ZOFRAN -ODT) disintegrating tablet 4 mg (4 mg Oral Given 07/28/23 1109)  diphenhydrAMINE  (BENADRYL ) capsule 25 mg (25 mg Oral Given 07/28/23 1109)    Initial Impression / Assessment and Plan / UC Course  I have reviewed the triage vital signs and the nursing notes.  Pertinent labs & imaging results that were available during my care of the patient were reviewed by me and considered  in my medical decision making (see chart for details).  Intractable migraine without aura with status migrainosus  Presentation consistent with migraine, no red symptoms, stable for outpatient management, no neurological deficit,, Toradol  IM given as well as oral Zofran  and Benadryl  which patient has had success with in the past, will continue prescribed medication for home use with ED follow-up for any worsening symptoms Final Clinical Impressions(s) / UC Diagnoses   Final diagnoses:  Intractable migraine without aura and with status migrainosus     Discharge Instructions      For your headache -On exam there are no abnormalities neurologically -You have been given an injection of Toradol , as Benadryl  and Zofran  here in the office today to help minimize your symptoms -You may continue use of your normal medications for management at home -While headaches are present ensure that you are getting adequate rest and adequate fluid intake -Participate in low stimulation activities avoiding bright lights and loud noises when symptoms are present -If your headaches continue to persist please follow-up with your primary doctor for reevaluation -At any point if you have the worst headache of your life please   ED Prescriptions   None    PDMP not reviewed this encounter.   Teresa Shelba SAUNDERS, NP 07/28/23 [phone]

## 2023-07-28 NOTE — ED Triage Notes (Signed)
 Patient complains of Migraine Head ache that started at 5 am. Patient reports took hydrocodone  with no relief. Rate pain 9/10. Patient also complains of nausea.

## 2023-08-08 DIAGNOSIS — G518 Other disorders of facial nerve: Secondary | ICD-10-CM | POA: Diagnosis not present

## 2023-08-08 DIAGNOSIS — M542 Cervicalgia: Secondary | ICD-10-CM | POA: Diagnosis not present

## 2023-08-08 DIAGNOSIS — G43719 Chronic migraine without aura, intractable, without status migrainosus: Secondary | ICD-10-CM | POA: Diagnosis not present

## 2023-08-08 DIAGNOSIS — M791 Myalgia, unspecified site: Secondary | ICD-10-CM | POA: Diagnosis not present

## 2023-08-10 DIAGNOSIS — I493 Ventricular premature depolarization: Secondary | ICD-10-CM | POA: Diagnosis not present

## 2023-08-10 DIAGNOSIS — R002 Palpitations: Secondary | ICD-10-CM | POA: Diagnosis not present

## 2023-08-10 DIAGNOSIS — G43019 Migraine without aura, intractable, without status migrainosus: Secondary | ICD-10-CM | POA: Diagnosis not present

## 2023-08-18 DIAGNOSIS — M65312 Trigger thumb, left thumb: Secondary | ICD-10-CM | POA: Diagnosis not present

## 2023-09-05 ENCOUNTER — Other Ambulatory Visit: Payer: Self-pay | Admitting: Family Medicine

## 2023-09-05 ENCOUNTER — Ambulatory Visit
Admission: RE | Admit: 2023-09-05 | Discharge: 2023-09-05 | Disposition: A | Discharge status: Home / Self Care | Source: Ambulatory Visit | Attending: Family Medicine | Admitting: Family Medicine

## 2023-09-05 DIAGNOSIS — M5380 Other specified dorsopathies, site unspecified: Secondary | ICD-10-CM | POA: Diagnosis not present

## 2023-09-05 DIAGNOSIS — M5416 Radiculopathy, lumbar region: Secondary | ICD-10-CM | POA: Diagnosis not present

## 2023-09-05 DIAGNOSIS — G959 Disease of spinal cord, unspecified: Secondary | ICD-10-CM | POA: Insufficient documentation

## 2023-09-05 DIAGNOSIS — M62830 Muscle spasm of back: Secondary | ICD-10-CM | POA: Diagnosis not present

## 2023-09-05 DIAGNOSIS — M5021 Other cervical disc displacement,  high cervical region: Secondary | ICD-10-CM | POA: Diagnosis not present

## 2023-09-05 DIAGNOSIS — M4802 Spinal stenosis, cervical region: Secondary | ICD-10-CM | POA: Diagnosis not present

## 2023-09-05 DIAGNOSIS — M50223 Other cervical disc displacement at C6-C7 level: Secondary | ICD-10-CM | POA: Diagnosis not present

## 2023-09-05 DIAGNOSIS — M48062 Spinal stenosis, lumbar region with neurogenic claudication: Secondary | ICD-10-CM | POA: Diagnosis not present

## 2023-09-05 DIAGNOSIS — S22080A Wedge compression fracture of T11-T12 vertebra, initial encounter for closed fracture: Secondary | ICD-10-CM | POA: Diagnosis not present

## 2023-09-12 DIAGNOSIS — G518 Other disorders of facial nerve: Secondary | ICD-10-CM | POA: Diagnosis not present

## 2023-09-12 DIAGNOSIS — M791 Myalgia, unspecified site: Secondary | ICD-10-CM | POA: Diagnosis not present

## 2023-09-12 DIAGNOSIS — M542 Cervicalgia: Secondary | ICD-10-CM | POA: Diagnosis not present

## 2023-09-12 DIAGNOSIS — G43719 Chronic migraine without aura, intractable, without status migrainosus: Secondary | ICD-10-CM | POA: Diagnosis not present

## 2023-09-20 DIAGNOSIS — R200 Anesthesia of skin: Secondary | ICD-10-CM | POA: Diagnosis not present

## 2023-09-22 ENCOUNTER — Encounter: Payer: Self-pay | Admitting: Dermatology

## 2023-09-22 ENCOUNTER — Ambulatory Visit: Payer: Medicare HMO | Admitting: Dermatology

## 2023-09-22 DIAGNOSIS — L821 Other seborrheic keratosis: Secondary | ICD-10-CM | POA: Diagnosis not present

## 2023-09-22 DIAGNOSIS — D229 Melanocytic nevi, unspecified: Secondary | ICD-10-CM

## 2023-09-22 DIAGNOSIS — L658 Other specified nonscarring hair loss: Secondary | ICD-10-CM

## 2023-09-22 DIAGNOSIS — Z1283 Encounter for screening for malignant neoplasm of skin: Secondary | ICD-10-CM | POA: Diagnosis not present

## 2023-09-22 DIAGNOSIS — L650 Telogen effluvium: Secondary | ICD-10-CM

## 2023-09-22 DIAGNOSIS — D485 Neoplasm of uncertain behavior of skin: Secondary | ICD-10-CM | POA: Diagnosis not present

## 2023-09-22 DIAGNOSIS — L578 Other skin changes due to chronic exposure to nonionizing radiation: Secondary | ICD-10-CM

## 2023-09-22 DIAGNOSIS — S80861A Insect bite (nonvenomous), right lower leg, initial encounter: Secondary | ICD-10-CM

## 2023-09-22 DIAGNOSIS — W908XXA Exposure to other nonionizing radiation, initial encounter: Secondary | ICD-10-CM | POA: Diagnosis not present

## 2023-09-22 DIAGNOSIS — L814 Other melanin hyperpigmentation: Secondary | ICD-10-CM | POA: Diagnosis not present

## 2023-09-22 DIAGNOSIS — W57XXXA Bitten or stung by nonvenomous insect and other nonvenomous arthropods, initial encounter: Secondary | ICD-10-CM

## 2023-09-22 DIAGNOSIS — D1801 Hemangioma of skin and subcutaneous tissue: Secondary | ICD-10-CM

## 2023-09-22 DIAGNOSIS — J449 Chronic obstructive pulmonary disease, unspecified: Secondary | ICD-10-CM | POA: Insufficient documentation

## 2023-09-22 NOTE — Patient Instructions (Addendum)
 Recommend minoxidil 5% (Rogaine for men) solution or foam to be applied to the scalp and left in. This should ideally be used twice daily for best results but it helps with hair regrowth when used at least three times per week. Rogaine initially can cause increased hair shedding for the first few weeks but this will stop with continued use. In studies, people who used minoxidil (Rogaine) for at least 6 months had thicker hair than people who did not. Minoxidil topical (Rogaine) only works as long as it continues to be used. If if it is no longer used then the hair it has been helping to regrow can fall out. Minoxidil topical (Rogaine) can cause increased facial hair growth.   Melanoma ABCDEs  Melanoma is the most dangerous type of skin cancer, and is the leading cause of death from skin disease.  You are more likely to develop melanoma if you: Have light-colored skin, light-colored eyes, or red or blond hair Spend a lot of time in the sun Tan regularly, either outdoors or in a tanning bed Have had blistering sunburns, especially during childhood Have a close family member who has had a melanoma Have atypical moles or large birthmarks  Early detection of melanoma is key since treatment is typically straightforward and cure rates are extremely high if we catch it early.   The first sign of melanoma is often a change in a mole or a new dark spot.  The ABCDE system is a way of remembering the signs of melanoma.  A for asymmetry:  The two halves do not match. B for border:  The edges of the growth are irregular. C for color:  A mixture of colors are present instead of an even brown color. D for diameter:  Melanomas are usually (but not always) greater than 6mm - the size of a pencil eraser. E for evolution:  The spot keeps changing in size, shape, and color.  Please check your skin once per month between visits. You can use a small mirror in front and a large mirror behind you to keep an eye on the  back side or your body.   If you see any new or changing lesions before your next follow-up, please call to schedule a visit.  Please continue daily skin protection including broad spectrum sunscreen SPF 30+ to sun-exposed areas, reapplying every 2 hours as needed when you're outdoors.    Due to recent changes in healthcare laws, you may see results of your pathology and/or laboratory studies on MyChart before the doctors have had a chance to review them. We understand that in some cases there may be results that are confusing or concerning to you. Please understand that not all results are received at the same time and often the doctors may need to interpret multiple results in order to provide you with the best plan of care or course of treatment. Therefore, we ask that you please give us  2 business days to thoroughly review all your results before contacting the office for clarification. Should we see a critical lab result, you will be contacted sooner.   If You Need Anything After Your Visit  If you have any questions or concerns for your doctor, please call our main line at [phone] and press option 4 to reach your doctor's medical assistant. If no one answers, please leave a voicemail as directed and we will return your call as soon as possible. Messages left after 4 pm will be answered the following  business day.   You may also send us  a message via MyChart. We typically respond to MyChart messages within 1-2 business days.  For prescription refills, please ask your pharmacy to contact our office. Our fax number is [phone].  If you have an urgent issue when the clinic is closed that cannot wait until the next business day, you can page your doctor at the number below.    Please note that while we do our best to be available for urgent issues outside of office hours, we are not available 24/7.   If you have an urgent issue and are unable to reach us , you may choose to seek medical  care at your doctor's office, retail clinic, urgent care center, or emergency room.  If you have a medical emergency, please immediately call 911 or go to the emergency department.  Pager Numbers  - Dr. Hester: [phone]  - Dr. Jackquline: [phone]  - Dr. Claudene: [phone]   - Dr. Raymund: [phone]  In the event of inclement weather, please call our main line at [phone] for an update on the status of any delays or closures.  Dermatology Medication Tips: Please keep the boxes that topical medications come in in order to help keep track of the instructions about where and how to use these. Pharmacies typically print the medication instructions only on the boxes and not directly on the medication tubes.   If your medication is too expensive, please contact our office at [phone] option 4 or send us  a message through MyChart.   We are unable to tell what your co-pay for medications will be in advance as this is different depending on your insurance coverage. However, we may be able to find a substitute medication at lower cost or fill out paperwork to get insurance to cover a needed medication.   If a prior authorization is required to get your medication covered by your insurance company, please allow us  1-2 business days to complete this process.  Drug prices often vary depending on where the prescription is filled and some pharmacies may offer cheaper prices.  The website www.goodrx.com contains coupons for medications through different pharmacies. The prices here do not account for what the cost may be with help from insurance (it may be cheaper with your insurance), but the website can give you the price if you did not use any insurance.  - You can print the associated coupon and take it with your prescription to the pharmacy.  - You may also stop by our office during regular business hours and pick up a GoodRx coupon card.  - If you need your prescription sent  electronically to a different pharmacy, notify our office through [facility] or by phone at [phone] option 4.     Si Usted Necesita Algo Despus de Su Visita  Tambin puede enviarnos un mensaje a travs de Clinical cytogeneticist. Por lo general respondemos a los mensajes de MyChart en el transcurso de 1 a 2 das hbiles.  Para renovar recetas, por favor pida a su farmacia que se ponga en contacto con nuestra oficina. Randi lakes de fax es [place] [phone].  Si tiene un asunto urgente cuando la clnica est cerrada y que no puede esperar hasta el siguiente da hbil, puede llamar/localizar a su doctor(a) al nmero que aparece a continuacin.   Por favor, tenga en cuenta que aunque hacemos todo lo posible para estar disponibles para asuntos urgentes fuera del horario de oficina, no estamos disponibles las 24  horas del Futures trader, los 7 [address]  [address] de la [place].   Si tiene un problema urgente y no puede comunicarse con nosotros, puede optar por buscar atencin mdica  en el consultorio de su doctor(a), en una clnica privada, en un centro de atencin urgente o en una sala de emergencias.  Si tiene Engineer, drilling, por favor llame inmediatamente al 911 o vaya a la sala de emergencias.  Nmeros de bper  - Dr. Hester: [phone]  - Dra. Jackquline: [phone]  - Dr. Claudene: [phone]  - Dra. Kitts: [phone]  En caso de inclemencias del [place], por favor llame a nuestra lnea principal al [phone] para una actualizacin sobre el estado de cualquier retraso o cierre.  Consejos para la medicacin en dermatologa: Por favor, guarde las cajas en las que vienen los medicamentos de uso tpico para ayudarle a seguir las instrucciones sobre dnde y cmo usarlos. Las farmacias generalmente imprimen las instrucciones del medicamento slo en las cajas y no directamente en los tubos del [place].   Si su medicamento es muy caro, por favor, pngase en contacto con landry rieger llamando al  [phone] y presione la opcin 4 o envenos un mensaje a travs de Clinical cytogeneticist.   No podemos decirle cul ser su copago por los medicamentos por adelantado ya que esto es diferente dependiendo de la cobertura de su seguro. Sin embargo, es posible que podamos encontrar un medicamento sustituto a Audiological scientist un formulario para que el seguro cubra el medicamento que se considera necesario.   Si se requiere una autorizacin previa para que su compaa de seguros malta su medicamento, por favor permtanos de 1 a 2 das hbiles para completar este proceso.  Los precios de los medicamentos varan con frecuencia dependiendo del Environmental consultant de dnde se surte la receta y alguna farmacias pueden ofrecer precios ms baratos.  El sitio web www.goodrx.com tiene cupones para medicamentos de Health and safety inspector. Los precios aqu no tienen en cuenta lo que podra costar con la ayuda del seguro (puede ser ms barato con su seguro), pero el sitio web puede darle el precio si no utiliz Tourist information centre manager.  - Puede imprimir el cupn correspondiente y llevarlo con su receta a la farmacia.  - Tambin puede pasar por nuestra oficina durante el horario de atencin regular y Education officer, museum una tarjeta de cupones de GoodRx.  - Si necesita que su receta se enve electrnicamente a una farmacia diferente, informe a nuestra oficina a travs de MyChart de [place] o por telfono llamando al [phone] y presione la opcin 4.

## 2023-09-22 NOTE — Progress Notes (Signed)
 Follow-Up Visit   Subjective  Jordan Ortega is a 68 y.o. female who presents for the following: Skin Cancer Screening and Full Body Skin Exam  The patient presents for Total-Body Skin Exam (TBSE) for skin cancer screening and mole check. The patient has spots, moles and lesions to be evaluated, some may be new or changing and the patient may have concern these could be cancer.  Hx DN. Patient with hx of hand dermatitis, comes and goes, has clobetasol  ointment to use prn. She does have a rough spot at back that she can not see but can feel it, present for a few months. Still c/o hair loss. Taking OTC supplement for hair, skin and nails.  Still stressed taking care of mom. Patient does get monthly injections to neck, shoulders and scalp for migraines.  The following portions of the chart were reviewed this encounter and updated as appropriate: medications, allergies, medical history  Review of Systems:  No other skin or systemic complaints except as noted in HPI or Assessment and Plan.  Objective  Well appearing patient in no apparent distress; mood and affect are within normal limits.  A full examination was performed including scalp, head, eyes, ears, nose, lips, neck, chest, axillae, abdomen, back, buttocks, bilateral upper extremities, bilateral lower extremities, hands, feet, fingers, toes, fingernails, and toenails. All findings within normal limits unless otherwise noted below.   Exam of nails limited by presence of nail polish.  Relevant physical exam findings are noted in the Assessment and Plan.  left posterior proximal thigh 3.5 cm subcutaneous nodule  Assessment & Plan   SKIN CANCER SCREENING PERFORMED TODAY.  ACTINIC DAMAGE - Chronic condition, secondary to cumulative UV/sun exposure - diffuse scaly erythematous macules with underlying dyspigmentation - Recommend daily broad spectrum sunscreen SPF 30+ to sun-exposed areas, reapply every 2 hours as needed.  - Staying  in the shade or wearing long sleeves, sun glasses (UVA+UVB protection) and wide brim hats (4-inch brim around the entire circumference of the hat) are also recommended for sun protection.  - Call for new or changing lesions.  LENTIGINES, SEBORRHEIC KERATOSES, HEMANGIOMAS - Benign normal skin lesions - Benign-appearing - Call for any changes  MELANOCYTIC NEVI - Tan-brown and/or pink-flesh-colored symmetric macules and papules - Benign appearing on exam today - Observation - Call clinic for new or changing moles - Recommend daily use of broad spectrum spf 30+ sunscreen to sun-exposed areas.   History of Dysplastic Nevi - No evidence of recurrence today - Recommend regular full body skin exams - Recommend daily broad spectrum sunscreen SPF 30+ to sun-exposed areas, reapply every 2 hours as needed.  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 It does not progress to baldness, and the hair eventually grows back on its own. It can be triggered by recent illness, recent surgery, thyroid  disease, low iron stores, vitamin D deficiency, fad diets or rapid weight loss, hormonal changes such as pregnancy or birth control pills, and some medication. Usually the hair loss starts 2-3 months after the illness or health change. Rarely, it can continue for longer than a year. Treatments options may include oral or topical Minoxidil; Red Light scalp treatments; Biotin 2.5 mg daily and other options.  Treatment Plan: Patient has upcoming physical and will  have blood work done to check iron and vitamin D levels.   Recommend minoxidil 5% (Rogaine for men) solution or foam to be applied to the scalp and left in. This should ideally be used twice daily for best results  but it helps with hair regrowth when used at least three times per week. Rogaine initially can cause increased hair shedding for the first few weeks but this will stop with continued use. In studies, people who used minoxidil (Rogaine) for at least 6 months had thicker hair than people who did not. Minoxidil topical (Rogaine) only works as long as it continues to be used. If if it is no longer used then the hair it has been helping to regrow can fall out. Minoxidil topical (Rogaine) can cause increased facial hair growth.  Avoid spironolactone oral minoxidil given cardiac conditions  INSECT BITE RXN Exam: pink papule at right distal lower leg  Treatment Plan: Not bothersome for patient. She will RTC for bx if not resolving on its own in a month.   NEOPLASM OF UNCERTAIN BEHAVIOR OF SKIN left posterior proximal thigh Will schedule for excision. Bothersome for patient, getting larger.  MULTIPLE BENIGN NEVI   LENTIGINES   ACTINIC ELASTOSIS   SEBORRHEIC KERATOSES   CHERRY ANGIOMA   TELOGEN EFFLUVIUM   FEMALE PATTERN HAIR LOSS   INSECT BITE OF RIGHT LEG, INITIAL ENCOUNTER   Return in about 1 year (around 09/21/2024) for TBSE, HxDN, with Dr. Claudene, Surgery Appt thigh.  Jordan Ortega, RMA, am acting as scribe for Boneta Claudene, MD .   Documentation: I have reviewed the above documentation for accuracy and completeness, and I agree with the above.  Boneta Claudene, MD

## 2023-10-12 ENCOUNTER — Other Ambulatory Visit: Payer: Self-pay | Admitting: Internal Medicine

## 2023-10-12 DIAGNOSIS — R002 Palpitations: Secondary | ICD-10-CM | POA: Diagnosis not present

## 2023-10-12 DIAGNOSIS — I5020 Unspecified systolic (congestive) heart failure: Secondary | ICD-10-CM | POA: Diagnosis not present

## 2023-10-12 DIAGNOSIS — M810 Age-related osteoporosis without current pathological fracture: Secondary | ICD-10-CM

## 2023-10-12 DIAGNOSIS — E782 Mixed hyperlipidemia: Secondary | ICD-10-CM | POA: Diagnosis not present

## 2023-10-18 ENCOUNTER — Other Ambulatory Visit: Payer: Self-pay | Admitting: Internal Medicine

## 2023-10-18 DIAGNOSIS — G518 Other disorders of facial nerve: Secondary | ICD-10-CM | POA: Diagnosis not present

## 2023-10-18 DIAGNOSIS — M791 Myalgia, unspecified site: Secondary | ICD-10-CM | POA: Diagnosis not present

## 2023-10-18 DIAGNOSIS — G43719 Chronic migraine without aura, intractable, without status migrainosus: Secondary | ICD-10-CM | POA: Diagnosis not present

## 2023-10-18 DIAGNOSIS — Z1231 Encounter for screening mammogram for malignant neoplasm of breast: Secondary | ICD-10-CM

## 2023-10-18 DIAGNOSIS — M542 Cervicalgia: Secondary | ICD-10-CM | POA: Diagnosis not present

## 2023-10-20 DIAGNOSIS — M810 Age-related osteoporosis without current pathological fracture: Secondary | ICD-10-CM | POA: Diagnosis not present

## 2023-10-20 DIAGNOSIS — Z1331 Encounter for screening for depression: Secondary | ICD-10-CM | POA: Diagnosis not present

## 2023-10-20 DIAGNOSIS — F17210 Nicotine dependence, cigarettes, uncomplicated: Secondary | ICD-10-CM | POA: Diagnosis not present

## 2023-10-25 DIAGNOSIS — M5416 Radiculopathy, lumbar region: Secondary | ICD-10-CM | POA: Diagnosis not present

## 2023-10-25 DIAGNOSIS — M5380 Other specified dorsopathies, site unspecified: Secondary | ICD-10-CM | POA: Diagnosis not present

## 2023-10-25 DIAGNOSIS — M48062 Spinal stenosis, lumbar region with neurogenic claudication: Secondary | ICD-10-CM | POA: Diagnosis not present

## 2023-10-26 ENCOUNTER — Encounter: Payer: Self-pay | Admitting: Dermatology

## 2023-10-26 ENCOUNTER — Ambulatory Visit: Admitting: Dermatology

## 2023-10-26 DIAGNOSIS — D1724 Benign lipomatous neoplasm of skin and subcutaneous tissue of left leg: Secondary | ICD-10-CM | POA: Diagnosis not present

## 2023-10-26 MED ORDER — MUPIROCIN 2 % EX OINT: TOPICAL_OINTMENT | CUTANEOUS | 0 refills | Status: AC

## 2023-10-26 NOTE — Progress Notes (Signed)
   Follow-Up Visit   Subjective  Jordan Ortega is a 68 y.o. female who presents for the following: Excision of a growth on the left posterior leg   The following portions of the chart were reviewed this encounter and updated as appropriate: medications, allergies, medical history  Review of Systems:  No other skin or systemic complaints except as noted in HPI or Assessment and Plan.  Objective  Well appearing patient in no apparent distress; mood and affect are within normal limits.  A focused examination was performed of the following areas: Left leg Relevant physical exam findings are noted in the Assessment and Plan.   Left Thigh - Posterior Subcutaneous nodule.   Assessment & Plan   LIPOMA OF LEFT LOWER EXTREMITY Left Thigh - Posterior Skin excision - Left Thigh - Posterior  Excision method:  elliptical Total excision diameter (cm):  2.2 Informed consent: discussed and consent obtained   Timeout: patient name, date of birth, surgical site, and procedure verified   Procedure prep:  Patient was prepped and draped in usual sterile fashion Prep type:  Chlorhexidine  Anesthesia: the lesion was anesthetized in a standard fashion   Anesthetic:  1% lidocaine  w/ epinephrine  1-100,000 buffered w/ 8.4% NaHCO3 (15 cc) Instrument used: #15 blade   Hemostasis achieved with: suture, pressure and electrodesiccation   Outcome: patient tolerated procedure well with no complications    Skin repair - Left Thigh - Posterior Complexity:  Intermediate Final length (cm):  4.1 Informed consent: discussed and consent obtained   Timeout: patient name, date of birth, surgical site, and procedure verified   Procedure prep:  Patient was prepped and draped in usual sterile fashion Prep type:  Chlorhexidine  Anesthesia: the lesion was anesthetized in a standard fashion   Anesthetic:  1% lidocaine  w/ epinephrine  1-100,000 buffered w/ 8.4% NaHCO3 Reason for type of repair: reduce tension to allow  closure, reduce the risk of dehiscence, infection, and necrosis, reduce subcutaneous dead space and avoid a hematoma, allow closure of the large defect and preserve normal anatomy   Undermining: edges could be approximated without difficulty   Subcutaneous layers (deep stitches):  Suture size:  4-0 Suture type: Monocryl (poliglecaprone 25)   Stitches:  Buried vertical mattress Fine/surface layer approximation (top stitches):  Suture size:  5-0 Suture type: Prolene (polypropylene)   Stitches: simple running   Suture removal (days):  7 Hemostasis achieved with: suture, pressure and electrodesiccation Outcome: patient tolerated procedure well with no complications   Post-procedure details: sterile dressing applied and wound care instructions given   Dressing type: petrolatum, bandage and pressure dressing    Specimen 1 - Surgical pathology Differential Diagnosis: Lipoma  Check Margins: No 2.2 cm Subcutaneous nodule   Return in about 7 years (around 10/26/2030) for suture removal .  I, Fay Kirks, CMA, am acting as scribe for Boneta Sharps, MD .   Documentation: I have reviewed the above documentation for accuracy and completeness, and I agree with the above.  Boneta Sharps, MD

## 2023-10-26 NOTE — Patient Instructions (Signed)
 Wound Care Instructions  On the day following your surgery, you should begin doing daily dressing changes: Remove the old dressing and discard it. Cleanse the wound gently with tap water. This may be done in the shower or by placing a wet gauze pad directly on the wound and letting it soak for several minutes. It is important to gently remove any dried blood from the wound in order to encourage healing. This may be done by gently rolling a moistened Q-tip on the dried blood. Do not pick at the wound. If the wound should start to bleed, continue cleaning the wound, then place a moist gauze pad on the wound and hold pressure for a few minutes.  Make sure you then dry the skin surrounding the wound completely or the tape will not stick to the skin. Do not use cotton balls on the wound. After the wound is clean and dry, apply the ointment gently with a Q-tip. Cut a non-stick pad to fit the size of the wound. Lay the pad flush to the wound. If the wound is draining, you may want to reinforce it with a small amount of gauze on top of the non-stick pad for a little added compression to the area. Use the tape to seal the area completely. Select from the following with respect to your individual situation: If your wound has been stitched closed: continue the above steps 1-8 at least daily until your sutures are removed. If your wound has been left open to heal: continue steps 1-8 at least daily for the first 3-4 weeks. We would like for you to take a few extra precautions for at least the next week. Sleep with your head elevated on pillows if our wound is on your head. Do not bend over or lift heavy items to reduce the chance of elevated blood pressure to the wound Do not participate in particularly strenuous activities.   Below is a list of dressing supplies you might need.  Cotton-tipped applicators - Q-tips Gauze pads (2x2 and/or 4x4) - All-Purpose Sponges Non-stick dressing material - Telfa Tape -  Paper or Hypafix New and clean tube of petroleum jelly - Vaseline    Comments on Post-Operative Period Slight swelling and redness often appear around the wound. This is normal and will disappear within several days following the surgery. The healing wound will drain a brownish-red-yellow discharge during healing. This is a normal phase of wound healing. As the wound begins to heal, the drainage may increase in amount. Again, this drainage is normal. Notify us if the drainage becomes persistently bloody, excessively swollen, or intensely painful or develops a foul odor or red streaks.  If you should experience mild discomfort during the healing phase, you may take an aspirin-free medication such as Tylenol (acetaminophen). Notify us if the discomfort is severe or persistent. Avoid alcoholic beverages when taking pain medicine.  In Case of Wound Hemorrhage A wound hemorrhage is when the bandage suddenly becomes soaked with bright red blood and flows profusely. If this happens, sit down or lie down with your head elevated. If the wound has a dressing on it, do not remove the dressing. Apply pressure to the existing gauze. If the wound is not covered, use a gauze pad to apply pressure and continue applying the pressure for 20 minutes without peeking. DO NOT COVER THE WOUND WITH A LARGE TOWEL OR WASH CLOTH. Release your hand from the wound site but do not remove the dressing. If the bleeding has stopped,  gently clean around the wound. Leave the dressing in place for 24 hours if possible. This wait time allows the blood vessels to close off so that you do not spark a new round of bleeding by disrupting the newly clotted blood vessels with an immediate dressing change. If the bleeding does not subside, continue to hold pressure. If matters are out of your control, contact an After Hours clinic or go to the Emergency Room.

## 2023-10-27 ENCOUNTER — Telehealth: Payer: Self-pay | Admitting: Dermatology

## 2023-10-27 ENCOUNTER — Ambulatory Visit: Payer: Self-pay | Admitting: Dermatology

## 2023-10-27 ENCOUNTER — Telehealth: Payer: Self-pay

## 2023-10-27 DIAGNOSIS — G8929 Other chronic pain: Secondary | ICD-10-CM

## 2023-10-27 LAB — SURGICAL PATHOLOGY

## 2023-10-27 NOTE — Telephone Encounter (Signed)
 Called patient to see how she doing post surgery, patient report he is in a lot of pain, on a scale of 1-10 patient report 7, I called to offer patient an appt her today, pt decline, she will continue Tylenol 

## 2023-10-27 NOTE — Telephone Encounter (Signed)
 Shared lipoma result with  patient. Tried to send hydrocodone  acetamino but imprivata is blocking. Offered to take paper script to patient's home. Patient declined but will call on Monday if still in pain.

## 2023-10-31 NOTE — Telephone Encounter (Signed)
-----   Message from [place] sent at 10/27/2023  6:46 PM EDT ----- left thigh - posterior :       LIPOMA   I called patient with result ----- Message ----- From: Interface, Lab In Three Zero One Sent: 10/27/2023   5:18 PM EDT To: Boneta Sharps, MD

## 2023-10-31 NOTE — Telephone Encounter (Signed)
 Dr. Claudene spoke with patient regarding result.

## 2023-11-03 ENCOUNTER — Encounter: Payer: Self-pay | Admitting: Dermatology

## 2023-11-03 ENCOUNTER — Ambulatory Visit: Admitting: Dermatology

## 2023-11-03 DIAGNOSIS — Z4802 Encounter for removal of sutures: Secondary | ICD-10-CM

## 2023-11-03 DIAGNOSIS — Z5189 Encounter for other specified aftercare: Secondary | ICD-10-CM

## 2023-11-03 DIAGNOSIS — Z48817 Encounter for surgical aftercare following surgery on the skin and subcutaneous tissue: Secondary | ICD-10-CM

## 2023-11-03 NOTE — Patient Instructions (Signed)

## 2023-11-03 NOTE — Progress Notes (Signed)
   Follow-Up Visit   Subjective  Jordan Ortega is a 68 y.o. female who presents for the following: Suture removal, some tenderness at excision site  Pathology showed lipoma  The following portions of the chart were reviewed this encounter and updated as appropriate: medications, allergies, medical history  Review of Systems:  No other skin or systemic complaints except as noted in HPI or Assessment and Plan.  Objective  Well appearing patient in no apparent distress; mood and affect are within normal limits.  Areas Examined: Left thigh Relevant physical exam findings are noted in the Assessment and Plan.    Assessment & Plan   VISIT FOR WOUND CHECK   ENCOUNTER FOR REMOVAL OF SUTURES   Encounter for Removal of Sutures - Incision site is clean, dry and intact.stable ecchymosis on medial side, no active bleeding, no tenderness to palpation, no discharge. Bruising within expectation for excision of moderately deep lipoma. Patient declined stronger pain medication. Pain has been improving each day. Patient will call if pain starts to worsen - Wound cleansed, sutures removed, wound cleansed - Discussed pathology results showing lipoma - Patient advised to call with any concerns or if they notice any new or changing lesions.  Return for TBSE, with Dr. Claudene, as scheduled, HxDN.  LILLETTE Lonell Drones, RMA, am acting as scribe for Boneta Claudene, MD .   Documentation: I have reviewed the above documentation for accuracy and completeness, and I agree with the above.  Boneta Claudene, MD

## 2023-11-04 DIAGNOSIS — M5441 Lumbago with sciatica, right side: Secondary | ICD-10-CM | POA: Diagnosis not present

## 2023-11-04 DIAGNOSIS — M79605 Pain in left leg: Secondary | ICD-10-CM | POA: Diagnosis not present

## 2023-11-04 DIAGNOSIS — M79604 Pain in right leg: Secondary | ICD-10-CM | POA: Diagnosis not present

## 2023-11-04 DIAGNOSIS — M5442 Lumbago with sciatica, left side: Secondary | ICD-10-CM | POA: Diagnosis not present

## 2023-11-04 DIAGNOSIS — G8929 Other chronic pain: Secondary | ICD-10-CM | POA: Diagnosis not present

## 2023-11-08 ENCOUNTER — Encounter: Payer: Self-pay | Admitting: Dermatology

## 2023-11-09 ENCOUNTER — Ambulatory Visit (INDEPENDENT_AMBULATORY_CARE_PROVIDER_SITE_OTHER): Admitting: Dermatology

## 2023-11-09 DIAGNOSIS — Z48817 Encounter for surgical aftercare following surgery on the skin and subcutaneous tissue: Secondary | ICD-10-CM | POA: Diagnosis not present

## 2023-11-09 DIAGNOSIS — G8918 Other acute postprocedural pain: Secondary | ICD-10-CM

## 2023-11-09 DIAGNOSIS — Z5189 Encounter for other specified aftercare: Secondary | ICD-10-CM

## 2023-11-09 DIAGNOSIS — T148XXA Other injury of unspecified body region, initial encounter: Secondary | ICD-10-CM

## 2023-11-09 DIAGNOSIS — M810 Age-related osteoporosis without current pathological fracture: Secondary | ICD-10-CM | POA: Diagnosis not present

## 2023-11-09 MED ORDER — CEFDINIR 300 MG PO CAPS: 300.0000 mg | ORAL_CAPSULE | Freq: Two times a day (BID) | ORAL | 0 refills | Status: AC

## 2023-11-09 NOTE — Patient Instructions (Signed)

## 2023-11-09 NOTE — Progress Notes (Unsigned)
   Follow-Up Visit   Subjective  Jordan Ortega is a 68 y.o. female who presents for the following: recheck biopsy site on the left posterior thigh, patient report area is painful and tender, keeping patient awake at night.    The following portions of the chart were reviewed this encounter and updated as appropriate: medications, allergies, medical history  Review of Systems:  No other skin or systemic complaints except as noted in HPI or Assessment and Plan.  Objective  Well appearing patient in no apparent distress; mood and affect are within normal limits.    A focused examination was performed of the following areas: Left post thigh  Relevant exam findings are noted in the Assessment and Plan. No sign of visible infection, site appears to be healing well   Assessment & Plan   Start Cefdinir  300 mg take bid x 7 days #14 0 RF      Return if symptoms worsen or fail to improve.  IFay Kirks, CMA, am acting as scribe for Boneta Sharps, MD .   Documentation: I have reviewed the above documentation for accuracy and completeness, and I agree with the above.  Boneta Sharps, MD

## 2023-11-10 ENCOUNTER — Encounter: Payer: Self-pay | Admitting: Dermatology

## 2023-11-15 ENCOUNTER — Ambulatory Visit: Admitting: Dermatology

## 2023-11-16 DIAGNOSIS — M542 Cervicalgia: Secondary | ICD-10-CM | POA: Diagnosis not present

## 2023-11-16 DIAGNOSIS — G518 Other disorders of facial nerve: Secondary | ICD-10-CM | POA: Diagnosis not present

## 2023-11-16 DIAGNOSIS — G43719 Chronic migraine without aura, intractable, without status migrainosus: Secondary | ICD-10-CM | POA: Diagnosis not present

## 2023-11-16 DIAGNOSIS — M791 Myalgia, unspecified site: Secondary | ICD-10-CM | POA: Diagnosis not present

## 2023-11-22 DIAGNOSIS — R262 Difficulty in walking, not elsewhere classified: Secondary | ICD-10-CM | POA: Diagnosis not present

## 2023-11-22 DIAGNOSIS — M5459 Other low back pain: Secondary | ICD-10-CM | POA: Diagnosis not present

## 2023-11-30 ENCOUNTER — Ambulatory Visit
Admission: RE | Admit: 2023-11-30 | Discharge: 2023-11-30 | Disposition: A | Discharge status: Home / Self Care | Source: Ambulatory Visit | Attending: Internal Medicine | Admitting: Internal Medicine

## 2023-11-30 DIAGNOSIS — Z1231 Encounter for screening mammogram for malignant neoplasm of breast: Secondary | ICD-10-CM | POA: Diagnosis not present

## 2023-12-05 ENCOUNTER — Other Ambulatory Visit

## 2023-12-06 DIAGNOSIS — R262 Difficulty in walking, not elsewhere classified: Secondary | ICD-10-CM | POA: Diagnosis not present

## 2023-12-06 DIAGNOSIS — M5459 Other low back pain: Secondary | ICD-10-CM | POA: Diagnosis not present

## 2023-12-14 DIAGNOSIS — M5459 Other low back pain: Secondary | ICD-10-CM | POA: Diagnosis not present

## 2023-12-14 DIAGNOSIS — R262 Difficulty in walking, not elsewhere classified: Secondary | ICD-10-CM | POA: Diagnosis not present

## 2023-12-19 DIAGNOSIS — R739 Hyperglycemia, unspecified: Secondary | ICD-10-CM | POA: Diagnosis not present

## 2023-12-19 DIAGNOSIS — Z79899 Other long term (current) drug therapy: Secondary | ICD-10-CM | POA: Diagnosis not present

## 2023-12-19 DIAGNOSIS — R413 Other amnesia: Secondary | ICD-10-CM | POA: Diagnosis not present

## 2023-12-19 DIAGNOSIS — Z1331 Encounter for screening for depression: Secondary | ICD-10-CM | POA: Diagnosis not present

## 2023-12-19 DIAGNOSIS — M5416 Radiculopathy, lumbar region: Secondary | ICD-10-CM | POA: Diagnosis not present

## 2023-12-19 DIAGNOSIS — Z0000 Encounter for general adult medical examination without abnormal findings: Secondary | ICD-10-CM | POA: Diagnosis not present

## 2023-12-20 DIAGNOSIS — G629 Polyneuropathy, unspecified: Secondary | ICD-10-CM | POA: Diagnosis not present

## 2023-12-20 DIAGNOSIS — M79604 Pain in right leg: Secondary | ICD-10-CM | POA: Diagnosis not present

## 2023-12-20 DIAGNOSIS — M79605 Pain in left leg: Secondary | ICD-10-CM | POA: Diagnosis not present

## 2023-12-20 DIAGNOSIS — G8929 Other chronic pain: Secondary | ICD-10-CM | POA: Diagnosis not present

## 2023-12-20 DIAGNOSIS — M5441 Lumbago with sciatica, right side: Secondary | ICD-10-CM | POA: Diagnosis not present

## 2023-12-20 DIAGNOSIS — M5442 Lumbago with sciatica, left side: Secondary | ICD-10-CM | POA: Diagnosis not present

## 2023-12-20 DIAGNOSIS — R200 Anesthesia of skin: Secondary | ICD-10-CM | POA: Diagnosis not present

## 2023-12-21 ENCOUNTER — Other Ambulatory Visit: Payer: Self-pay | Admitting: Internal Medicine

## 2023-12-21 DIAGNOSIS — R413 Other amnesia: Secondary | ICD-10-CM

## 2023-12-26 ENCOUNTER — Ambulatory Visit
Admission: RE | Admit: 2023-12-26 | Discharge: 2023-12-26 | Disposition: A | Discharge status: Home / Self Care | Source: Ambulatory Visit | Attending: Internal Medicine | Admitting: Internal Medicine

## 2023-12-26 ENCOUNTER — Encounter: Payer: Self-pay | Admitting: Acute Care

## 2023-12-26 DIAGNOSIS — R413 Other amnesia: Secondary | ICD-10-CM | POA: Insufficient documentation

## 2024-01-06 DIAGNOSIS — H40033 Anatomical narrow angle, bilateral: Secondary | ICD-10-CM | POA: Diagnosis not present

## 2024-01-06 DIAGNOSIS — H04123 Dry eye syndrome of bilateral lacrimal glands: Secondary | ICD-10-CM | POA: Diagnosis not present

## 2024-01-06 DIAGNOSIS — H2513 Age-related nuclear cataract, bilateral: Secondary | ICD-10-CM | POA: Diagnosis not present

## 2024-02-21 ENCOUNTER — Encounter: Payer: Self-pay | Admitting: Acute Care

## 2024-02-27 ENCOUNTER — Inpatient Hospital Stay: Admission: RE | Admit: 2024-02-27 | Source: Ambulatory Visit

## 2024-03-06 ENCOUNTER — Inpatient Hospital Stay
Admission: RE | Admit: 2024-03-06 | Discharge: 2024-03-06 | Disposition: A | Source: Ambulatory Visit | Attending: Acute Care | Admitting: Acute Care

## 2024-03-06 DIAGNOSIS — Z122 Encounter for screening for malignant neoplasm of respiratory organs: Secondary | ICD-10-CM

## 2024-03-06 DIAGNOSIS — F17210 Nicotine dependence, cigarettes, uncomplicated: Secondary | ICD-10-CM

## 2024-03-06 DIAGNOSIS — Z87891 Personal history of nicotine dependence: Secondary | ICD-10-CM

## 2024-03-07 ENCOUNTER — Other Ambulatory Visit: Payer: Self-pay

## 2024-03-07 DIAGNOSIS — Z87891 Personal history of nicotine dependence: Secondary | ICD-10-CM

## 2024-03-07 DIAGNOSIS — F17210 Nicotine dependence, cigarettes, uncomplicated: Secondary | ICD-10-CM

## 2024-03-07 DIAGNOSIS — Z122 Encounter for screening for malignant neoplasm of respiratory organs: Secondary | ICD-10-CM

## 2024-09-24 ENCOUNTER — Ambulatory Visit: Admitting: Dermatology
